# Patient Record
Sex: Female | Born: 1975
Health system: Southern US, Community
[De-identification: ages and names within clinical notes are randomized; demographics above are authoritative.]

## PROBLEM LIST (undated history)

## (undated) DIAGNOSIS — J45909 Unspecified asthma, uncomplicated: Secondary | ICD-10-CM

## (undated) DIAGNOSIS — R0602 Shortness of breath: Secondary | ICD-10-CM

## (undated) DIAGNOSIS — E109 Type 1 diabetes mellitus without complications: Secondary | ICD-10-CM

## (undated) DIAGNOSIS — M1990 Unspecified osteoarthritis, unspecified site: Secondary | ICD-10-CM

## (undated) DIAGNOSIS — K219 Gastro-esophageal reflux disease without esophagitis: Secondary | ICD-10-CM

## (undated) DIAGNOSIS — J383 Other diseases of vocal cords: Secondary | ICD-10-CM

## (undated) DIAGNOSIS — I10 Essential (primary) hypertension: Secondary | ICD-10-CM

## (undated) DIAGNOSIS — E785 Hyperlipidemia, unspecified: Secondary | ICD-10-CM

## (undated) DIAGNOSIS — B009 Herpesviral infection, unspecified: Secondary | ICD-10-CM

## (undated) DIAGNOSIS — F329 Major depressive disorder, single episode, unspecified: Secondary | ICD-10-CM

## (undated) DIAGNOSIS — F411 Generalized anxiety disorder: Secondary | ICD-10-CM

## (undated) HISTORY — DX: Essential (primary) hypertension: I10

## (undated) HISTORY — DX: Major depressive disorder, single episode, unspecified: F32.9

## (undated) HISTORY — DX: Gastro-esophageal reflux disease without esophagitis: K21.9

## (undated) HISTORY — DX: Herpesviral infection, unspecified: B00.9

## (undated) HISTORY — DX: Shortness of breath: R06.02

## (undated) HISTORY — DX: Other diseases of vocal cords: J38.3

## (undated) HISTORY — DX: Unspecified asthma, uncomplicated: J45.909

## (undated) HISTORY — DX: Generalized anxiety disorder: F41.1

## (undated) HISTORY — DX: Unspecified osteoarthritis, unspecified site: M19.90

## (undated) HISTORY — DX: Type 1 diabetes mellitus without complications: E10.9

---

## 1997-08-17 ENCOUNTER — Encounter (HOSPITAL_COMMUNITY): Admission: RE | Admit: 1997-08-17 | Discharge: 1997-09-01 | Payer: Self-pay | Admitting: Obstetrics and Gynecology

## 1997-08-24 ENCOUNTER — Ambulatory Visit (HOSPITAL_COMMUNITY): Admission: RE | Admit: 1997-08-24 | Discharge: 1997-08-24 | Payer: Self-pay | Admitting: Obstetrics and Gynecology

## 1997-08-27 ENCOUNTER — Inpatient Hospital Stay (HOSPITAL_COMMUNITY): Admission: AD | Admit: 1997-08-27 | Discharge: 1997-08-28 | Payer: Self-pay | Admitting: Obstetrics and Gynecology

## 1997-09-01 ENCOUNTER — Inpatient Hospital Stay (HOSPITAL_COMMUNITY): Admission: RE | Admit: 1997-09-01 | Discharge: 1997-09-04 | Payer: Self-pay | Admitting: Obstetrics and Gynecology

## 1997-09-07 ENCOUNTER — Encounter: Admission: RE | Admit: 1997-09-07 | Discharge: 1997-12-06 | Payer: Self-pay | Admitting: Obstetrics and Gynecology

## 1998-03-29 ENCOUNTER — Emergency Department (HOSPITAL_COMMUNITY): Admission: EM | Admit: 1998-03-29 | Discharge: 1998-03-29 | Payer: Self-pay | Admitting: *Deleted

## 1998-12-12 ENCOUNTER — Emergency Department (HOSPITAL_COMMUNITY): Admission: EM | Admit: 1998-12-12 | Discharge: 1998-12-12 | Payer: Self-pay | Admitting: Emergency Medicine

## 1998-12-12 ENCOUNTER — Encounter: Payer: Self-pay | Admitting: Emergency Medicine

## 1998-12-13 ENCOUNTER — Encounter: Payer: Self-pay | Admitting: Emergency Medicine

## 1998-12-13 ENCOUNTER — Ambulatory Visit (HOSPITAL_COMMUNITY): Admission: RE | Admit: 1998-12-13 | Discharge: 1998-12-13 | Payer: Self-pay | Admitting: Emergency Medicine

## 1999-05-27 ENCOUNTER — Other Ambulatory Visit: Admission: RE | Admit: 1999-05-27 | Discharge: 1999-05-27 | Payer: Self-pay | Admitting: Obstetrics and Gynecology

## 2000-06-01 ENCOUNTER — Emergency Department (HOSPITAL_COMMUNITY): Admission: EM | Admit: 2000-06-01 | Discharge: 2000-06-01 | Payer: Self-pay | Admitting: Emergency Medicine

## 2001-05-06 ENCOUNTER — Encounter: Admission: RE | Admit: 2001-05-06 | Discharge: 2001-08-04 | Payer: Self-pay | Admitting: Obstetrics and Gynecology

## 2001-05-08 ENCOUNTER — Other Ambulatory Visit: Admission: RE | Admit: 2001-05-08 | Discharge: 2001-05-08 | Payer: Self-pay | Admitting: Obstetrics and Gynecology

## 2001-06-24 ENCOUNTER — Encounter: Payer: Self-pay | Admitting: Obstetrics and Gynecology

## 2001-06-24 ENCOUNTER — Ambulatory Visit (HOSPITAL_COMMUNITY): Admission: RE | Admit: 2001-06-24 | Discharge: 2001-06-24 | Payer: Self-pay | Admitting: Obstetrics and Gynecology

## 2001-07-02 ENCOUNTER — Ambulatory Visit (HOSPITAL_COMMUNITY): Admission: RE | Admit: 2001-07-02 | Discharge: 2001-07-02 | Payer: Self-pay | Admitting: Obstetrics and Gynecology

## 2001-07-02 ENCOUNTER — Encounter: Payer: Self-pay | Admitting: Obstetrics and Gynecology

## 2001-09-09 ENCOUNTER — Inpatient Hospital Stay (HOSPITAL_COMMUNITY): Admission: AD | Admit: 2001-09-09 | Discharge: 2001-09-09 | Payer: Self-pay | Admitting: Obstetrics and Gynecology

## 2001-09-10 ENCOUNTER — Inpatient Hospital Stay (HOSPITAL_COMMUNITY): Admission: AD | Admit: 2001-09-10 | Discharge: 2001-09-10 | Payer: Self-pay | Admitting: Obstetrics and Gynecology

## 2001-09-17 ENCOUNTER — Encounter (HOSPITAL_COMMUNITY): Admission: RE | Admit: 2001-09-17 | Discharge: 2001-10-17 | Payer: Self-pay | Admitting: Obstetrics and Gynecology

## 2001-09-17 ENCOUNTER — Inpatient Hospital Stay (HOSPITAL_COMMUNITY): Admission: AD | Admit: 2001-09-17 | Discharge: 2001-09-17 | Payer: Self-pay | Admitting: Obstetrics and Gynecology

## 2001-10-01 ENCOUNTER — Encounter: Payer: Self-pay | Admitting: Obstetrics and Gynecology

## 2001-10-18 ENCOUNTER — Encounter (HOSPITAL_COMMUNITY): Admission: RE | Admit: 2001-10-18 | Discharge: 2001-11-05 | Payer: Self-pay | Admitting: Obstetrics and Gynecology

## 2001-10-26 ENCOUNTER — Inpatient Hospital Stay (HOSPITAL_COMMUNITY): Admission: AD | Admit: 2001-10-26 | Discharge: 2001-10-26 | Payer: Self-pay | Admitting: Obstetrics and Gynecology

## 2001-11-03 ENCOUNTER — Inpatient Hospital Stay (HOSPITAL_COMMUNITY): Admission: AD | Admit: 2001-11-03 | Discharge: 2001-11-03 | Payer: Self-pay | Admitting: Obstetrics and Gynecology

## 2001-11-05 ENCOUNTER — Encounter: Payer: Self-pay | Admitting: Obstetrics and Gynecology

## 2001-11-07 ENCOUNTER — Inpatient Hospital Stay (HOSPITAL_COMMUNITY): Admission: AD | Admit: 2001-11-07 | Discharge: 2001-11-10 | Payer: Self-pay | Admitting: Obstetrics and Gynecology

## 2001-11-07 ENCOUNTER — Encounter (INDEPENDENT_AMBULATORY_CARE_PROVIDER_SITE_OTHER): Payer: Self-pay

## 2003-05-11 ENCOUNTER — Other Ambulatory Visit: Admission: RE | Admit: 2003-05-11 | Discharge: 2003-05-11 | Payer: Self-pay | Admitting: Obstetrics and Gynecology

## 2003-10-11 ENCOUNTER — Emergency Department (HOSPITAL_COMMUNITY): Admission: EM | Admit: 2003-10-11 | Discharge: 2003-10-11 | Payer: Self-pay | Admitting: Emergency Medicine

## 2003-10-14 ENCOUNTER — Emergency Department (HOSPITAL_COMMUNITY): Admission: EM | Admit: 2003-10-14 | Discharge: 2003-10-14 | Payer: Self-pay | Admitting: Emergency Medicine

## 2003-10-16 ENCOUNTER — Encounter: Admission: RE | Admit: 2003-10-16 | Discharge: 2003-10-16 | Payer: Self-pay | Admitting: Endocrinology

## 2004-06-21 ENCOUNTER — Ambulatory Visit (HOSPITAL_COMMUNITY): Admission: RE | Admit: 2004-06-21 | Discharge: 2004-06-21 | Payer: Self-pay | Admitting: Obstetrics and Gynecology

## 2004-08-01 ENCOUNTER — Ambulatory Visit (HOSPITAL_COMMUNITY): Admission: RE | Admit: 2004-08-01 | Discharge: 2004-08-01 | Payer: Self-pay | Admitting: Obstetrics and Gynecology

## 2004-08-04 ENCOUNTER — Ambulatory Visit: Payer: Self-pay | Admitting: Endocrinology

## 2004-08-15 ENCOUNTER — Ambulatory Visit: Payer: Self-pay

## 2004-08-18 ENCOUNTER — Ambulatory Visit: Payer: Self-pay | Admitting: Endocrinology

## 2004-09-23 ENCOUNTER — Ambulatory Visit: Payer: Self-pay | Admitting: Endocrinology

## 2004-11-08 ENCOUNTER — Ambulatory Visit: Payer: Self-pay | Admitting: Endocrinology

## 2004-11-09 ENCOUNTER — Ambulatory Visit: Payer: Self-pay

## 2005-06-14 ENCOUNTER — Ambulatory Visit: Payer: Self-pay | Admitting: Endocrinology

## 2005-09-13 ENCOUNTER — Ambulatory Visit: Payer: Self-pay | Admitting: Endocrinology

## 2006-04-10 ENCOUNTER — Ambulatory Visit: Payer: Self-pay | Admitting: Endocrinology

## 2006-06-20 ENCOUNTER — Ambulatory Visit: Payer: Self-pay | Admitting: Internal Medicine

## 2006-07-19 ENCOUNTER — Ambulatory Visit: Payer: Self-pay | Admitting: Endocrinology

## 2006-08-07 ENCOUNTER — Ambulatory Visit: Payer: Self-pay | Admitting: Endocrinology

## 2006-09-19 ENCOUNTER — Ambulatory Visit: Payer: Self-pay | Admitting: Endocrinology

## 2006-09-24 ENCOUNTER — Ambulatory Visit: Payer: Self-pay | Admitting: Endocrinology

## 2006-12-14 ENCOUNTER — Ambulatory Visit: Payer: Self-pay | Admitting: Internal Medicine

## 2006-12-20 ENCOUNTER — Ambulatory Visit: Payer: Self-pay | Admitting: Endocrinology

## 2007-02-02 ENCOUNTER — Encounter: Payer: Self-pay | Admitting: Endocrinology

## 2007-02-02 DIAGNOSIS — J45909 Unspecified asthma, uncomplicated: Secondary | ICD-10-CM

## 2007-02-02 DIAGNOSIS — E109 Type 1 diabetes mellitus without complications: Secondary | ICD-10-CM

## 2007-02-02 HISTORY — DX: Unspecified asthma, uncomplicated: J45.909

## 2007-02-02 HISTORY — DX: Type 1 diabetes mellitus without complications: E10.9

## 2007-03-23 ENCOUNTER — Emergency Department (HOSPITAL_COMMUNITY): Admission: EM | Admit: 2007-03-23 | Discharge: 2007-03-23 | Payer: Self-pay | Admitting: Emergency Medicine

## 2007-05-01 ENCOUNTER — Ambulatory Visit: Payer: Self-pay | Admitting: Endocrinology

## 2007-05-01 ENCOUNTER — Telehealth: Payer: Self-pay | Admitting: Endocrinology

## 2007-07-18 HISTORY — PX: ABDOMINAL HYSTERECTOMY: SHX81

## 2007-07-30 ENCOUNTER — Encounter: Payer: Self-pay | Admitting: Endocrinology

## 2007-08-01 ENCOUNTER — Encounter: Admission: RE | Admit: 2007-08-01 | Discharge: 2007-08-01 | Payer: Self-pay | Admitting: Otolaryngology

## 2007-09-25 ENCOUNTER — Ambulatory Visit: Payer: Self-pay | Admitting: Endocrinology

## 2007-09-25 ENCOUNTER — Telehealth (INDEPENDENT_AMBULATORY_CARE_PROVIDER_SITE_OTHER): Payer: Self-pay | Admitting: *Deleted

## 2007-09-25 DIAGNOSIS — R0602 Shortness of breath: Secondary | ICD-10-CM

## 2007-09-25 DIAGNOSIS — Z720 Tobacco use: Secondary | ICD-10-CM | POA: Insufficient documentation

## 2007-09-25 DIAGNOSIS — F17200 Nicotine dependence, unspecified, uncomplicated: Secondary | ICD-10-CM | POA: Insufficient documentation

## 2007-09-25 HISTORY — DX: Shortness of breath: R06.02

## 2007-09-27 ENCOUNTER — Telehealth: Payer: Self-pay | Admitting: Endocrinology

## 2007-10-03 ENCOUNTER — Telehealth: Payer: Self-pay | Admitting: Endocrinology

## 2007-10-16 ENCOUNTER — Ambulatory Visit: Payer: Self-pay | Admitting: Gastroenterology

## 2007-10-16 DIAGNOSIS — R1012 Left upper quadrant pain: Secondary | ICD-10-CM | POA: Insufficient documentation

## 2007-10-16 DIAGNOSIS — R197 Diarrhea, unspecified: Secondary | ICD-10-CM | POA: Insufficient documentation

## 2007-11-02 ENCOUNTER — Emergency Department (HOSPITAL_COMMUNITY): Admission: EM | Admit: 2007-11-02 | Discharge: 2007-11-02 | Payer: Self-pay | Admitting: Emergency Medicine

## 2007-11-04 ENCOUNTER — Ambulatory Visit: Payer: Self-pay | Admitting: Gastroenterology

## 2007-11-04 LAB — HM COLONOSCOPY

## 2007-11-07 ENCOUNTER — Ambulatory Visit (HOSPITAL_COMMUNITY): Admission: RE | Admit: 2007-11-07 | Discharge: 2007-11-07 | Payer: Self-pay | Admitting: Gastroenterology

## 2007-11-19 ENCOUNTER — Ambulatory Visit: Payer: Self-pay | Admitting: Gastroenterology

## 2008-01-08 ENCOUNTER — Ambulatory Visit: Payer: Self-pay | Admitting: Gastroenterology

## 2008-01-15 ENCOUNTER — Ambulatory Visit (HOSPITAL_COMMUNITY): Admission: RE | Admit: 2008-01-15 | Discharge: 2008-01-16 | Payer: Self-pay | Admitting: Obstetrics and Gynecology

## 2008-01-15 ENCOUNTER — Encounter (INDEPENDENT_AMBULATORY_CARE_PROVIDER_SITE_OTHER): Payer: Self-pay | Admitting: Obstetrics and Gynecology

## 2008-01-29 ENCOUNTER — Telehealth: Payer: Self-pay | Admitting: Endocrinology

## 2008-01-29 DIAGNOSIS — D239 Other benign neoplasm of skin, unspecified: Secondary | ICD-10-CM | POA: Insufficient documentation

## 2008-02-03 ENCOUNTER — Encounter (INDEPENDENT_AMBULATORY_CARE_PROVIDER_SITE_OTHER): Payer: Self-pay | Admitting: *Deleted

## 2008-03-01 ENCOUNTER — Encounter: Payer: Self-pay | Admitting: Endocrinology

## 2008-03-16 ENCOUNTER — Telehealth (INDEPENDENT_AMBULATORY_CARE_PROVIDER_SITE_OTHER): Payer: Self-pay | Admitting: *Deleted

## 2008-04-16 ENCOUNTER — Ambulatory Visit: Payer: Self-pay | Admitting: Endocrinology

## 2008-05-04 ENCOUNTER — Ambulatory Visit: Payer: Self-pay | Admitting: Endocrinology

## 2008-05-04 DIAGNOSIS — J0100 Acute maxillary sinusitis, unspecified: Secondary | ICD-10-CM | POA: Insufficient documentation

## 2008-05-04 DIAGNOSIS — J019 Acute sinusitis, unspecified: Secondary | ICD-10-CM | POA: Insufficient documentation

## 2008-05-04 DIAGNOSIS — Z8709 Personal history of other diseases of the respiratory system: Secondary | ICD-10-CM | POA: Insufficient documentation

## 2008-05-21 ENCOUNTER — Telehealth (INDEPENDENT_AMBULATORY_CARE_PROVIDER_SITE_OTHER): Payer: Self-pay | Admitting: *Deleted

## 2008-08-04 ENCOUNTER — Emergency Department (HOSPITAL_COMMUNITY): Admission: EM | Admit: 2008-08-04 | Discharge: 2008-08-05 | Payer: Self-pay | Admitting: Emergency Medicine

## 2008-08-12 ENCOUNTER — Ambulatory Visit: Payer: Self-pay | Admitting: Internal Medicine

## 2008-08-12 DIAGNOSIS — J209 Acute bronchitis, unspecified: Secondary | ICD-10-CM | POA: Insufficient documentation

## 2008-09-28 ENCOUNTER — Telehealth: Payer: Self-pay | Admitting: Endocrinology

## 2008-10-14 ENCOUNTER — Telehealth (INDEPENDENT_AMBULATORY_CARE_PROVIDER_SITE_OTHER): Payer: Self-pay | Admitting: *Deleted

## 2008-10-15 ENCOUNTER — Telehealth: Payer: Self-pay | Admitting: Endocrinology

## 2008-10-22 ENCOUNTER — Telehealth (INDEPENDENT_AMBULATORY_CARE_PROVIDER_SITE_OTHER): Payer: Self-pay | Admitting: *Deleted

## 2008-11-25 ENCOUNTER — Encounter: Payer: Self-pay | Admitting: Internal Medicine

## 2008-11-26 ENCOUNTER — Emergency Department (HOSPITAL_COMMUNITY): Admission: EM | Admit: 2008-11-26 | Discharge: 2008-11-26 | Payer: Self-pay | Admitting: Family Medicine

## 2008-12-17 ENCOUNTER — Telehealth: Payer: Self-pay | Admitting: Gastroenterology

## 2008-12-21 ENCOUNTER — Ambulatory Visit: Payer: Self-pay | Admitting: Gastroenterology

## 2008-12-21 DIAGNOSIS — R141 Gas pain: Secondary | ICD-10-CM | POA: Insufficient documentation

## 2008-12-21 DIAGNOSIS — K219 Gastro-esophageal reflux disease without esophagitis: Secondary | ICD-10-CM

## 2008-12-21 DIAGNOSIS — R143 Flatulence: Secondary | ICD-10-CM

## 2008-12-21 DIAGNOSIS — R142 Eructation: Secondary | ICD-10-CM

## 2008-12-21 HISTORY — DX: Gastro-esophageal reflux disease without esophagitis: K21.9

## 2009-01-13 ENCOUNTER — Telehealth (INDEPENDENT_AMBULATORY_CARE_PROVIDER_SITE_OTHER): Payer: Self-pay | Admitting: *Deleted

## 2009-01-15 ENCOUNTER — Telehealth (INDEPENDENT_AMBULATORY_CARE_PROVIDER_SITE_OTHER): Payer: Self-pay | Admitting: *Deleted

## 2009-02-16 ENCOUNTER — Telehealth: Payer: Self-pay | Admitting: Gastroenterology

## 2009-02-16 ENCOUNTER — Ambulatory Visit: Payer: Self-pay | Admitting: Endocrinology

## 2009-02-16 DIAGNOSIS — F3289 Other specified depressive episodes: Secondary | ICD-10-CM

## 2009-02-16 DIAGNOSIS — F329 Major depressive disorder, single episode, unspecified: Secondary | ICD-10-CM

## 2009-02-16 HISTORY — DX: Other specified depressive episodes: F32.89

## 2009-02-16 HISTORY — DX: Major depressive disorder, single episode, unspecified: F32.9

## 2009-02-17 ENCOUNTER — Telehealth: Payer: Self-pay | Admitting: Physician Assistant

## 2009-02-19 ENCOUNTER — Ambulatory Visit: Payer: Self-pay | Admitting: Gastroenterology

## 2009-02-19 DIAGNOSIS — R110 Nausea: Secondary | ICD-10-CM | POA: Insufficient documentation

## 2009-03-18 ENCOUNTER — Telehealth (INDEPENDENT_AMBULATORY_CARE_PROVIDER_SITE_OTHER): Payer: Self-pay | Admitting: *Deleted

## 2009-03-26 ENCOUNTER — Ambulatory Visit: Payer: Self-pay | Admitting: Endocrinology

## 2009-03-26 DIAGNOSIS — F411 Generalized anxiety disorder: Secondary | ICD-10-CM | POA: Insufficient documentation

## 2009-03-26 HISTORY — DX: Generalized anxiety disorder: F41.1

## 2009-03-29 ENCOUNTER — Ambulatory Visit: Payer: Self-pay | Admitting: Gastroenterology

## 2009-03-29 ENCOUNTER — Encounter (INDEPENDENT_AMBULATORY_CARE_PROVIDER_SITE_OTHER): Payer: Self-pay | Admitting: *Deleted

## 2009-03-29 ENCOUNTER — Encounter: Payer: Self-pay | Admitting: Physician Assistant

## 2009-03-29 ENCOUNTER — Encounter: Payer: Self-pay | Admitting: Endocrinology

## 2009-03-29 LAB — CONVERTED CEMR LAB
CRP, High Sensitivity: 0.5 (ref 0.00–5.00)
Eosinophils Relative: 0.9 % (ref 0.0–5.0)
HCT: 44.7 % (ref 36.0–46.0)
Hemoglobin: 15.2 g/dL — ABNORMAL HIGH (ref 12.0–15.0)
Lymphocytes Relative: 26.4 % (ref 12.0–46.0)
Lymphs Abs: 1.8 10*3/uL (ref 0.7–4.0)
Monocytes Relative: 8 % (ref 3.0–12.0)
Neutro Abs: 4.4 10*3/uL (ref 1.4–7.7)
Platelets: 205 10*3/uL (ref 150.0–400.0)
WBC: 6.9 10*3/uL (ref 4.5–10.5)

## 2009-03-31 LAB — CONVERTED CEMR LAB: Tissue Transglutaminase Ab, IgA: 0.3 units (ref <7)

## 2009-05-03 ENCOUNTER — Telehealth: Payer: Self-pay | Admitting: Endocrinology

## 2009-05-07 ENCOUNTER — Telehealth (INDEPENDENT_AMBULATORY_CARE_PROVIDER_SITE_OTHER): Payer: Self-pay | Admitting: *Deleted

## 2009-05-10 ENCOUNTER — Telehealth: Payer: Self-pay | Admitting: Endocrinology

## 2009-06-28 ENCOUNTER — Emergency Department (HOSPITAL_COMMUNITY): Admission: EM | Admit: 2009-06-28 | Discharge: 2009-06-28 | Payer: Self-pay | Admitting: Family Medicine

## 2009-08-25 ENCOUNTER — Ambulatory Visit: Payer: Self-pay | Admitting: Endocrinology

## 2009-08-25 DIAGNOSIS — L738 Other specified follicular disorders: Secondary | ICD-10-CM | POA: Insufficient documentation

## 2010-01-23 ENCOUNTER — Emergency Department (HOSPITAL_COMMUNITY): Admission: EM | Admit: 2010-01-23 | Discharge: 2010-01-23 | Payer: Self-pay | Admitting: Emergency Medicine

## 2010-02-18 ENCOUNTER — Ambulatory Visit: Payer: Self-pay | Admitting: Endocrinology

## 2010-02-18 DIAGNOSIS — G4700 Insomnia, unspecified: Secondary | ICD-10-CM | POA: Insufficient documentation

## 2010-02-28 ENCOUNTER — Encounter: Payer: Self-pay | Admitting: Endocrinology

## 2010-02-28 ENCOUNTER — Telehealth (INDEPENDENT_AMBULATORY_CARE_PROVIDER_SITE_OTHER): Payer: Self-pay | Admitting: *Deleted

## 2010-03-09 ENCOUNTER — Ambulatory Visit: Payer: Self-pay | Admitting: Internal Medicine

## 2010-03-09 DIAGNOSIS — N390 Urinary tract infection, site not specified: Secondary | ICD-10-CM | POA: Insufficient documentation

## 2010-03-09 LAB — CONVERTED CEMR LAB
Blood in Urine, dipstick: NEGATIVE
Glucose, Urine, Semiquant: >=1000
Ketones, urine, test strip: NEGATIVE
Urobilinogen, UA: 0.2
pH: 5

## 2010-03-18 ENCOUNTER — Ambulatory Visit: Payer: Self-pay | Admitting: Endocrinology

## 2010-03-18 ENCOUNTER — Telehealth: Payer: Self-pay | Admitting: Endocrinology

## 2010-03-18 DIAGNOSIS — N764 Abscess of vulva: Secondary | ICD-10-CM | POA: Insufficient documentation

## 2010-04-01 DIAGNOSIS — R32 Unspecified urinary incontinence: Secondary | ICD-10-CM

## 2010-04-01 DIAGNOSIS — F32A Depression, unspecified: Secondary | ICD-10-CM | POA: Insufficient documentation

## 2010-04-01 DIAGNOSIS — F39 Unspecified mood [affective] disorder: Secondary | ICD-10-CM | POA: Insufficient documentation

## 2010-04-01 DIAGNOSIS — K219 Gastro-esophageal reflux disease without esophagitis: Secondary | ICD-10-CM | POA: Insufficient documentation

## 2010-04-01 DIAGNOSIS — E109 Type 1 diabetes mellitus without complications: Secondary | ICD-10-CM | POA: Insufficient documentation

## 2010-04-01 HISTORY — DX: Gastro-esophageal reflux disease without esophagitis: K21.9

## 2010-04-01 HISTORY — DX: Unspecified urinary incontinence: R32

## 2010-04-07 ENCOUNTER — Telehealth (INDEPENDENT_AMBULATORY_CARE_PROVIDER_SITE_OTHER): Payer: Self-pay | Admitting: *Deleted

## 2010-05-10 ENCOUNTER — Emergency Department (HOSPITAL_COMMUNITY): Admission: EM | Admit: 2010-05-10 | Discharge: 2010-05-10 | Payer: Self-pay | Admitting: Emergency Medicine

## 2010-05-12 ENCOUNTER — Encounter: Payer: Self-pay | Admitting: Endocrinology

## 2010-05-12 ENCOUNTER — Ambulatory Visit: Payer: Self-pay | Admitting: Endocrinology

## 2010-05-12 ENCOUNTER — Telehealth: Payer: Self-pay | Admitting: Endocrinology

## 2010-05-12 DIAGNOSIS — R930 Abnormal findings on diagnostic imaging of skull and head, not elsewhere classified: Secondary | ICD-10-CM | POA: Insufficient documentation

## 2010-05-12 LAB — CONVERTED CEMR LAB
AST: 16 units/L (ref 0–37)
Albumin: 4.2 g/dL (ref 3.5–5.2)
Alkaline Phosphatase: 112 units/L (ref 39–117)
BUN: 10 mg/dL (ref 6–23)
Basophils Absolute: 0 10*3/uL (ref 0.0–0.1)
Bilirubin, Direct: 0 mg/dL (ref 0.0–0.3)
CO2: 24 meq/L (ref 19–32)
Calcium: 9.1 mg/dL (ref 8.4–10.5)
Cholesterol: 220 mg/dL — ABNORMAL HIGH (ref 0–200)
Creatinine, Ser: 0.5 mg/dL (ref 0.4–1.2)
Creatinine,U: 7.3 mg/dL
Eosinophils Absolute: 0.1 10*3/uL (ref 0.0–0.7)
GFR calc non Af Amer: 140.29 mL/min (ref >60)
Glucose, Bld: 236 mg/dL — ABNORMAL HIGH (ref 70–99)
HDL: 49.9 mg/dL (ref >39.00)
Hemoglobin: 14.8 g/dL (ref 12.0–15.0)
Leukocytes, UA: NEGATIVE
Lymphocytes Relative: 30.6 % (ref 12.0–46.0)
MCHC: 35 g/dL (ref 30.0–36.0)
Microalb Creat Ratio: 17.7 mg/g (ref 0.0–30.0)
Microalb, Ur: 1.3 mg/dL (ref 0.0–1.9)
Monocytes Relative: 6.8 % (ref 3.0–12.0)
Neutro Abs: 4.3 10*3/uL (ref 1.4–7.7)
Neutrophils Relative %: 61.2 % (ref 43.0–77.0)
Platelets: 221 10*3/uL (ref 150.0–400.0)
RDW: 13.1 % (ref 11.5–14.6)
Sodium: 127 meq/L — ABNORMAL LOW (ref 135–145)
Specific Gravity, Urine: <=1.005 (ref 1.000–1.030)
Total Bilirubin: 0.3 mg/dL (ref 0.3–1.2)
Triglycerides: 200 mg/dL — ABNORMAL HIGH (ref 0.0–149.0)
Urine Glucose: 500 mg/dL
Urobilinogen, UA: 0.2 (ref 0.0–1.0)

## 2010-06-08 ENCOUNTER — Ambulatory Visit: Payer: Self-pay | Admitting: Pulmonary Disease

## 2010-06-08 DIAGNOSIS — J989 Respiratory disorder, unspecified: Secondary | ICD-10-CM | POA: Insufficient documentation

## 2010-06-17 ENCOUNTER — Ambulatory Visit (HOSPITAL_BASED_OUTPATIENT_CLINIC_OR_DEPARTMENT_OTHER)
Admission: RE | Admit: 2010-06-17 | Discharge: 2010-06-17 | Payer: Self-pay | Source: Home / Self Care | Admitting: Urology

## 2010-07-30 ENCOUNTER — Emergency Department (HOSPITAL_COMMUNITY)
Admission: EM | Admit: 2010-07-30 | Discharge: 2010-07-30 | Payer: Self-pay | Source: Home / Self Care | Admitting: Emergency Medicine

## 2010-08-01 LAB — GLUCOSE, CAPILLARY: Glucose-Capillary: 276 mg/dL — ABNORMAL HIGH (ref 70–99)

## 2010-08-01 LAB — CBC
HCT: 41.8 % (ref 36.0–46.0)
Hemoglobin: 14.9 g/dL (ref 12.0–15.0)
MCH: 31.9 pg (ref 26.0–34.0)
MCHC: 35.6 g/dL (ref 30.0–36.0)
MCV: 89.5 fL (ref 78.0–100.0)
Platelets: 175 10*3/uL (ref 150–400)
RBC: 4.67 MIL/uL (ref 3.87–5.11)
RDW: 12.7 % (ref 11.5–15.5)
WBC: 13.3 10*3/uL — ABNORMAL HIGH (ref 4.0–10.5)

## 2010-08-01 LAB — COMPREHENSIVE METABOLIC PANEL
ALT: 15 U/L (ref 0–35)
AST: 20 U/L (ref 0–37)
Albumin: 4.2 g/dL (ref 3.5–5.2)
Alkaline Phosphatase: 99 U/L (ref 39–117)
BUN: 4 mg/dL — ABNORMAL LOW (ref 6–23)
CO2: 23 mEq/L (ref 19–32)
Calcium: 9.4 mg/dL (ref 8.4–10.5)
Chloride: 95 mEq/L — ABNORMAL LOW (ref 96–112)
Creatinine, Ser: 0.55 mg/dL (ref 0.4–1.2)
GFR calc Af Amer: >60 mL/min (ref >60)
GFR calc non Af Amer: >60 mL/min (ref >60)
Glucose, Bld: 335 mg/dL — ABNORMAL HIGH (ref 70–99)
Potassium: 3.8 mEq/L (ref 3.5–5.1)
Sodium: 130 mEq/L — ABNORMAL LOW (ref 135–145)
Total Bilirubin: 0.5 mg/dL (ref 0.3–1.2)
Total Protein: 7.4 g/dL (ref 6.0–8.3)

## 2010-08-01 LAB — URINALYSIS, ROUTINE W REFLEX MICROSCOPIC
Bilirubin Urine: NEGATIVE
Ketones, ur: NEGATIVE mg/dL
Leukocytes, UA: NEGATIVE
Nitrite: NEGATIVE
Protein, ur: NEGATIVE mg/dL
Specific Gravity, Urine: 1.016 (ref 1.005–1.030)
Urine Glucose, Fasting: >1000 mg/dL — AB
Urobilinogen, UA: 0.2 mg/dL (ref 0.0–1.0)
pH: 5 (ref 5.0–8.0)

## 2010-08-01 LAB — DIFFERENTIAL
Basophils Absolute: 0 10*3/uL (ref 0.0–0.1)
Basophils Relative: 0 % (ref 0–1)
Eosinophils Absolute: 0 10*3/uL (ref 0.0–0.7)
Eosinophils Relative: 0 % (ref 0–5)
Lymphocytes Relative: 11 % — ABNORMAL LOW (ref 12–46)
Lymphs Abs: 1.5 10*3/uL (ref 0.7–4.0)
Monocytes Absolute: 0.7 10*3/uL (ref 0.1–1.0)
Monocytes Relative: 6 % (ref 3–12)
Neutro Abs: 11 10*3/uL — ABNORMAL HIGH (ref 1.7–7.7)
Neutrophils Relative %: 83 % — ABNORMAL HIGH (ref 43–77)

## 2010-08-01 LAB — URINE MICROSCOPIC-ADD ON

## 2010-08-01 LAB — POCT PREGNANCY, URINE: Preg Test, Ur: NEGATIVE

## 2010-08-01 LAB — LIPASE, BLOOD: Lipase: 35 U/L (ref 11–59)

## 2010-08-08 ENCOUNTER — Telehealth: Payer: Self-pay | Admitting: Endocrinology

## 2010-08-08 LAB — GLUCOSE, CAPILLARY

## 2010-08-10 ENCOUNTER — Ambulatory Visit: Admit: 2010-08-10 | Payer: Self-pay | Admitting: Endocrinology

## 2010-08-14 LAB — CONVERTED CEMR LAB: Pap Smear: NORMAL

## 2010-08-16 NOTE — Assessment & Plan Note (Signed)
Summary: post er/chest pain/emphyzema/lb   Vital Signs:  Patient profile:   35 year old female Height:      67.5 inches (171.45 cm) Weight:      130.38 pounds (59.26 kg) BMI:     20.19 O2 Sat:      97 % on Room air Temp:     98.6 degrees F (37.00 degrees C) oral Pulse rate:   96 / minute BP sitting:   138 / 74  (left arm) Cuff size:   regular  Vitals Entered By: Brenton Grills MA (May 12, 2010 10:54 AM)  O2 Flow:  Room air CC: Post ER F/U/chest pain/aj Is Patient Diabetic? Yes   Referring Provider:  n/a Primary Provider:  Romero Belling, MD   CC:  Post ER F/U/chest pain/aj.  History of Present Illness: pt states few weeks of moderate pain in the chest, in the context of cough.   no cbg record, but states cbg's are well-controlled   Current Medications (verified): 1)  Paxil 40 Mg  Tabs (Paroxetine Hcl) .... Qd 2)  Aurora Pen Needles 31g X 8 Mm Misc (Insulin Pen Needle) .... Use As Directed Pt Needs Fu Appt No Additional Refills 3)  Proventil Hfa 108 (90 Base) Mcg/act Aers (Albuterol Sulfate) .... 2 Puffs Q 4-6 Hours 4)  Omeprazole 20 Mg Cpdr (Omeprazole) .Marland Kitchen.. 1 By Mouth Once Daily 5)  Prodigy Blood Glucose Test  Strp (Glucose Blood) .... 6/day, and Lancets.  250.01.  Variable Glucoses. 6)  Advair Diskus 100-50 Mcg/dose Aepb (Fluticasone-Salmeterol) .Marland Kitchen.. 1 Puff Two Times A Day 7)  Novolog Flexpen 100 Unit/ml Soln (Insulin Aspart) .Marland Kitchen.. 10 Units Three Times A Day (Qac) 8)  Doxycycline Hyclate 100 Mg Tabs (Doxycycline Hyclate) .Marland Kitchen.. 1 By Mouth Two Times A Day 9)  Ropinirole Hcl 0.5 Mg Tabs (Ropinirole Hcl) .Marland Kitchen.. 1 Tab At Bedtime  Allergies (verified): 1)  ! Codeine  Past History:  Past Medical History: Last updated: 03/09/2010 GERD DIABETES MELLITUS, TYPE I (ICD-250.01) ASTHMA (ICD-493.90) Endometriosis/S/P HYSTERECTOMY SMOKER  Social History: Reviewed history from 12/21/2008 and no changes required. Separated. 2 children--pt has custody.  husb apys child  support. Patient currently smokes. 1 1/2ppd. Alcohol Use - no Daily Caffeine Use     3 2 liters a day    Illicit Drug Use - no Patient does not get regular exercise.   Review of Systems  The patient denies dyspnea on exertion.         she had low-grade fever.    Physical Exam  General:  normal appearance.   Lungs:  Clear to auscultation bilaterally. Normal respiratory effort.  Additional Exam:  outside test results are reviewed:  i reviewed cxr report from the hospital  today: Sodium         [L]  127 mEq/L  Hemoglobin A1C       [H]  10.3 %  Cholesterol LDL  142.7 mg/dL   Impression & Recommendations:  Problem # 1:  NONSPCIFC ABN FINDING RAD & OTH EXAM LUNG FIELD (ICD-793.1) uncertain etiology  Problem # 2:  chest pain prob due to acute bronchitis  Problem # 3:  DIABETES MELLITUS, TYPE I (ICD-250.01) poor control  Problem # 4:  SMOKER (ICD-305.1) pt would like to quit  Problem # 5:  hyperlipidemia needs increased rx  Medications Added to Medication List This Visit: 1)  Doxycycline Hyclate 100 Mg Tabs (Doxycycline hyclate) .Marland Kitchen.. 1 by mouth two times a day 2)  Chantix Starting Month Pak 0.5 Mg X  11 & 1 Mg X 42 Tabs (Varenicline tartrate) .... Take two times a day.  refill with continuing packs.  Other Orders: Spirometry w/Graph (94010) TLB-Lipid Panel (80061-LIPID) TLB-BMP (Basic Metabolic Panel-BMET) (80048-METABOL) TLB-CBC Platelet - w/Differential (85025-CBCD) TLB-Hepatic/Liver Function Pnl (80076-HEPATIC) TLB-TSH (Thyroid Stimulating Hormone) (84443-TSH) TLB-A1C / Hgb A1C (Glycohemoglobin) (83036-A1C) TLB-Microalbumin/Creat Ratio, Urine (82043-MALB) TLB-Udip w/ Micro (81001-URINE) Est. Patient Level IV (38756)  Patient Instructions: 1)  refer pulmonary.  you will be called with a day and time for an appointment. 2)  pending the specialist appointment, continue advair and proventil. 3)  i sent [facility].   4)  blood tests are being  ordered for you today.  please call [phone] to hear your test results. 5)  (update: i left message on phone-tree:  ret 1-2 weeks with cbg record.  we'll do acth test then.  you should consider chol med). Prescriptions: CHANTIX STARTING MONTH PAK 0.5 MG X 11 & 1 MG X 42 TABS (VARENICLINE TARTRATE) take two times a day.  refill with continuing packs.  #1 pack x 5   Entered and Authorized by:   Minus Breeding MD   Signed by:   Minus Breeding MD on 05/12/2010   Method used:   Electronically to        [facility] (ret[facility])       [address]       [place], [place]  [address]       Ph: [phone]       Fax: [phone]   RxID:   [id number]    Orders Added: 1)  Spirometry w/Graph [94010] 2)  TLB-Lipid Panel [80061-LIPID] 3)  TLB-BMP (Basic Metabolic Panel-BMET) [80048-METABOL] 4)  TLB-CBC Platelet - w/Differential [85025-CBCD] 5)  TLB-Hepatic/Liver Function Pnl [80076-HEPATIC] 6)  TLB-TSH (Thyroid Stimulating Hormone) [84443-TSH] 7)  TLB-A1C / Hgb A1C (Glycohemoglobin) [83036-A1C] 8)  TLB-Microalbumin/Creat Ratio, Urine [82043-MALB] 9)  TLB-Udip w/ Micro [81001-URINE] 10)  Est. Patient Level IV [51761]

## 2010-08-16 NOTE — Progress Notes (Signed)
Summary: referral  Phone Note Call from Patient Call back at Home Phone [phone]   Caller: Patient Summary of Call: Pt wanted to be sure that MD made referral to Pulmo. Referral is not in EMR, please add. Initial call taken by: Margaret Pyle, CMA,  May 12, 2010 4:07 PM  Follow-up for Phone Call        done Follow-up by: Minus Breeding MD,  May 12, 2010 4:26 PM  Additional Follow-up for Phone Call Additional follow up Details #1::        Pt's mother informed and told to expect a call from [facility] with appt info Additional Follow-up by: Margaret Pyle, CMA,  May 12, 2010 4:32 PM

## 2010-08-16 NOTE — Assessment & Plan Note (Signed)
Summary: UTI?  [facility] PT /NWS   Vital Signs:  Patient profile:   35 year old female Height:      67.5 inches (171.45 cm) Weight:      125.4 pounds (57 kg) O2 Sat:      97 % on Room air Temp:     98.6 degrees F (37.00 degrees C) oral Pulse rate:   95 / minute BP sitting:   110 / 76  (left arm) Cuff size:   regular  Vitals Entered By: Orlan Leavens RMA (March 09, 2010 3:06 PM)  O2 Flow:  Room air CC: ? UTI Is Patient Diabetic? Yes Did you bring your meter with you today? No   Primary Care Provider:  Romero Belling, MD   CC:  ? UTI.  History of Present Illness: c/o bladder infection symptoms -  onset 4 days ago - described as suprapubic pressure - not assoc with fever, flank pain or hematuria - +hx same  also would like nicotine patches -  plans to stop smoking - precipitated by smoker boyfriend in hosp now with MI - hosp x 12d   Current Medications (verified): 1)  Paxil 40 Mg  Tabs (Paroxetine Hcl) .... Qd 2)  Aurora Pen Needles 31g X 8 Mm Misc (Insulin Pen Needle) .... Use As Directed Pt Needs Fu Appt No Additional Refills 3)  Proventil Hfa 108 (90 Base) Mcg/act Aers (Albuterol Sulfate) .... 2 Puffs Q 4-6 Hours 4)  Omeprazole 20 Mg Cpdr (Omeprazole) .Marland Kitchen.. 1 By Mouth Once Daily 5)  Prodigy Blood Glucose Test  Strp (Glucose Blood) .... 6/day, and Lancets.  250.01.  Variable Glucoses. 6)  Advair Diskus 100-50 Mcg/dose Aepb (Fluticasone-Salmeterol) .Marland Kitchen.. 1 Puff Two Times A Day 7)  Novolog Flexpen 100 Unit/ml Soln (Insulin Aspart) .Marland Kitchen.. 10 Units Three Times A Day (Qac) 8)  Doxycycline Hyclate 100 Mg Tabs (Doxycycline Hyclate) .Marland Kitchen.. 1 Qd 9)  Ropinirole Hcl 0.5 Mg Tabs (Ropinirole Hcl) .Marland Kitchen.. 1 Tab At Bedtime  Allergies (verified): 1)  ! Codeine  Past History:  Past Medical History: GERD DIABETES MELLITUS, TYPE I (ICD-250.01) ASTHMA (ICD-493.90) Endometriosis/S/P HYSTERECTOMY SMOKER  Review of Systems  The patient denies chest pain, headaches, hemoptysis, abdominal pain,  hematuria, incontinence, and genital sores.    Physical Exam  General:  alert, well-developed, well-nourished, and cooperative to examination.   thin; dtr at side Lungs:  Clear to auscultation bilaterally, except for a few exp wheezes.. Normal respiratory effort.  Heart:  Regular rate and rhythm; no murmurs, rubs,  or bruits. Abdomen:  soft, non-tender, normal bowel sounds, no distention; no masses and no appreciable hepatomegaly or splenomegaly.     Impression & Recommendations:  Problem # 1:  UTI (ICD-599.0)  Udip unremarkable but classic symptoms  send for Ucx and tx with 3 d cipro at least until Ucx back (high risk with DM) Her updated medication list for this problem includes:    Doxycycline Hyclate 100 Mg Tabs (Doxycycline hyclate) .Marland Kitchen... 1 qd    Ciprofloxacin Hcl 500 Mg Tabs (Ciprofloxacin hcl) .Marland Kitchen... 1 by mouth two times a day x 3 days  Orders: UA Dipstick w/o Micro (manual) (45409) Prescription Created Electronically [phone]) T-Culture, Urine (47829-56213)  Encouraged to push clear liquids, get enough rest, and take acetaminophen as needed. To be seen in 10 days if no improvement, sooner if worse.  Problem # 2:  SMOKER (ICD-305.1)  5 minutes today spent on patient education regarding the unhealthy effects of continued tobacco abuse and encouragment of cessation including  medical options available to help patient to quit smoking.  Her updated medication list for this problem includes:    Nicotine 21 Mg/24hr Pt24 (Nicotine) .Marland Kitchen... Change patch daily every 24h x 7days    Nicotine 14 Mg/24hr Pt24 (Nicotine) .Marland Kitchen... Change patch daily x 7 days    Nicotine 7 Mg/24hr Pt24 (Nicotine) .Marland Kitchen... Change daily x 7 days  Orders: Tobacco use cessation intermediate 3-10 minutes (81191)  Problem # 3:  DIABETES MELLITUS, TYPE I (ICD-250.01)  Her updated medication list for this problem includes:    Novolog Flexpen 100 Unit/ml Soln (Insulin aspart) .Marland KitchenMarland KitchenMarland KitchenMarland Kitchen 10 units three times a day  (qac)  Orders: Tobacco use cessation intermediate 3-10 minutes (99406)  Complete Medication List: 1)  Paxil 40 Mg Tabs (Paroxetine hcl) .... Qd 2)  Aurora Pen Needles 31g X 8 Mm Misc (Insulin pen needle) .... Use as directed pt needs fu appt no additional refills 3)  Proventil Hfa 108 (90 Base) Mcg/act Aers (Albuterol sulfate) .... 2 puffs q 4-6 hours 4)  Omeprazole 20 Mg Cpdr (Omeprazole) .Marland Kitchen.. 1 by mouth once daily 5)  Prodigy Blood Glucose Test Strp (Glucose blood) .... 6/day, and lancets.  250.01.  variable glucoses. 6)  Advair Diskus 100-50 Mcg/dose Aepb (Fluticasone-salmeterol) .Marland Kitchen.. 1 puff two times a day 7)  Novolog Flexpen 100 Unit/ml Soln (Insulin aspart) .Marland Kitchen.. 10 units three times a day (qac) 8)  Doxycycline Hyclate 100 Mg Tabs (Doxycycline hyclate) .Marland Kitchen.. 1 qd 9)  Ropinirole Hcl 0.5 Mg Tabs (Ropinirole hcl) .Marland Kitchen.. 1 tab at bedtime 10)  Ciprofloxacin Hcl 500 Mg Tabs (Ciprofloxacin hcl) .Marland Kitchen.. 1 by mouth two times a day x 3 days 11)  Nicotine 21 Mg/24hr Pt24 (Nicotine) .... Change patch daily every 24h x 7days 12)  Nicotine 14 Mg/24hr Pt24 (Nicotine) .... Change patch daily x 7 days 13)  Nicotine 7 Mg/24hr Pt24 (Nicotine) .... Change daily x 7 days  Patient Instructions: 1)  it was good to see you today. 2)  Cipro for bladder infection symptoms + nicotine patches to help stop smoking as discussed -  3)  your prescriptions have been electronically submitted to your pharmacy. Please take as directed. Contact our office if you believe you're having problems with the medication(s).  4)  Please schedule a follow-up appointment as needed. Prescriptions: NICOTINE 7 MG/24HR PT24 (NICOTINE) change daily x 7 days  #7 x 0   Entered and Authorized by:   Newt Lukes MD   Signed by:   Newt Lukes MD on 03/09/2010   Method used:   Electronically to        [facility] (retail)       [address]       [place], [place]  [address]       Ph: [phone]        Fax: [phone]   RxID:   [id number] NICOTINE 14 MG/24HR PT24 (NICOTINE) change patch daily x 7 days  #7 x 0   Entered and Authorized by:   Newt Lukes MD   Signed by:   Newt Lukes MD on 03/09/2010   Method used:   Electronically to        [facility] (retail)       [address]       [place], [place]  [address]       Ph: [phone]  Fax: [phone]   RxID:   [id number] NICOTINE 21 MG/24HR PT24 (NICOTINE) change patch daily every 24h x 7days  #1 box x 0   Entered and Authorized by:   Newt Lukes MD   Signed by:   Newt Lukes MD on 03/09/2010   Method used:   Electronically to        [facility] (retail)       [address]       [place], [place]  [address]       Ph: [phone]       Fax: [phone]   RxID:   [id number] CIPROFLOXACIN HCL 500 MG TABS (CIPROFLOXACIN HCL) 1 by mouth two times a day x 3 days  #6 x 0   Entered and Authorized by:   Newt Lukes MD   Signed by:   Newt Lukes MD on 03/09/2010   Method used:   Electronically to        [facility] (retail)       [address]       [place], [place]  [address]       Ph: [phone]       Fax: [phone]   RxID:   [id number]   Laboratory Results   Urine Tests    Routine Urinalysis   Color: colorless Appearance: Clear Glucose: >=1000   (Normal Range: Negative) Bilirubin: negative   (Normal Range: Negative) Ketone: negative   (Normal Range: Negative) Spec. Gravity: <1.005   (Normal Range: 1.003-1.035) Blood: negative   (Normal Range: Negative) pH: 5.0   (Normal Range: 5.0-8.0) Protein: negative   (Normal Range: Negative) Urobilinogen: 0.2   (Normal Range: 0-1) Nitrite: negative   (Normal Range: Negative) Leukocyte Esterace: negative   (Normal Range: Negative)

## 2010-08-16 NOTE — Assessment & Plan Note (Signed)
Summary: SORE IN [facility]cie Ip /NWS   Vital Signs:  Patient profile:   35 year old female Height:      67.5 inches (171.45 cm) Weight:      126 pounds (57.27 kg) O2 Sat:      96 % Temp:     100.1 degrees F (37.83 degrees C) oral Pulse rate:   102 / minute Pulse rhythm:   regular BP sitting:   115 / 80  (left arm)  Vitals Entered By: Jarome Lamas (March 18, 2010 10:39 AM) CC: sore in grion area/pb Is Patient Diabetic? Yes   Referring Provider:  n/a Primary Provider:  Romero Belling, MD   CC:  sore in grion area/pb.  History of Present Illness: pt states few weeks of increase in the pain at the left inguinal area.  the pain is now moderate.  she has assoc myalgias.  she says she has been steadily been taking the doxycycline.  Current Medications (verified): 1)  Paxil 40 Mg  Tabs (Paroxetine Hcl) .... Qd 2)  Aurora Pen Needles 31g X 8 Mm Misc (Insulin Pen Needle) .... Use As Directed Pt Needs Fu Appt No Additional Refills 3)  Proventil Hfa 108 (90 Base) Mcg/act Aers (Albuterol Sulfate) .... 2 Puffs Q 4-6 Hours 4)  Omeprazole 20 Mg Cpdr (Omeprazole) .Marland Kitchen.. 1 By Mouth Once Daily 5)  Prodigy Blood Glucose Test  Strp (Glucose Blood) .... 6/day, and Lancets.  250.01.  Variable Glucoses. 6)  Advair Diskus 100-50 Mcg/dose Aepb (Fluticasone-Salmeterol) .Marland Kitchen.. 1 Puff Two Times A Day 7)  Novolog Flexpen 100 Unit/ml Soln (Insulin Aspart) .Marland Kitchen.. 10 Units Three Times A Day (Qac) 8)  Doxycycline Hyclate 100 Mg Tabs (Doxycycline Hyclate) .Marland Kitchen.. 1 Qd 9)  Ropinirole Hcl 0.5 Mg Tabs (Ropinirole Hcl) .Marland Kitchen.. 1 Tab At Bedtime 10)  Nicotine 14 Mg/24hr Pt24 (Nicotine) .... Change Patch Daily X 7 Days 11)  Nicotine 7 Mg/24hr Pt24 (Nicotine) .... Change Daily X 7 Days  Allergies (verified): 1)  ! Codeine  Past History:  Past Medical History: Last updated: 03/09/2010 GERD DIABETES MELLITUS, TYPE I (ICD-250.01) ASTHMA (ICD-493.90) Endometriosis/S/P HYSTERECTOMY SMOKER  Review of Systems  The patient  denies fever.    Physical Exam  General:  normal appearance.   Genitalia:  left vulvar/inguinal area:  has 6x4 cm area of erythema/warmth/tenderness.   Impression & Recommendations:  Problem # 1:  VULVAR ABSCESS (ICD-616.4) Assessment New  Other Orders: Est. Patient Level III (04540)  Patient Instructions: 1)  i called dr Ambrose Mantle.  he will see her now.

## 2010-08-16 NOTE — Assessment & Plan Note (Signed)
Summary: PER CHRISTY--MED FU--STC   Vital Signs:  Patient profile:   35 year old female Height:      67.5 inches (171.45 cm) Weight:      131.25 pounds (59.66 kg) O2 Sat:      98 % on Room air Temp:     97.0 degrees F (36.11 degrees C) oral Pulse rate:   84 / minute BP sitting:   118 / 76  (left arm) Cuff size:   regular  Vitals Entered By: Josph Macho CMA (August 25, 2009 11:07 AM)  O2 Flow:  Room air CC: Med follow up/ pt would like antibiotic for infection on Left inner thigh/ CF Is Patient Diabetic? Yes   Referring Provider:  n/a Primary Provider:  Romero Belling, MD   CC:  Med follow up/ pt would like antibiotic for infection on Left inner thigh/ CF.  History of Present Illness: see above.  pt states the infection has recurred since she has been off the antibiotic. no cbg record, but states cbg's are "high at bedtime." pt says she wants to quit smoking.  Current Medications (verified): 1)  Paxil 40 Mg  Tabs (Paroxetine Hcl) .... Qd 2)  Aurora Pen Needles 31g X 8 Mm Misc (Insulin Pen Needle) .... Use As Directed Pt Needs Fu Appt No Additional Refills 3)  Proventil Hfa 108 (90 Base) Mcg/act Aers (Albuterol Sulfate) .... 2 Puffs Q 4-6 Hours 4)  Omeprazole 20 Mg Cpdr (Omeprazole) .Marland Kitchen.. 1 By Mouth Once Daily 5)  Prodigy Blood Glucose Test  Strp (Glucose Blood) .... 6/day, and Lancets.  250.01.  Variable Glucoses. 6)  Advair Diskus 100-50 Mcg/dose Aepb (Fluticasone-Salmeterol) .Marland Kitchen.. 1 Puff Two Times A Day 7)  Ampicillin 250 Mg Caps (Ampicillin) .... Take One Tab 4 Times A Day For 7 Days and Then Every Other Week 8)  Novolog Flexpen 100 Unit/ml Soln (Insulin Aspart) .Marland Kitchen.. 10 Units Three Times A Day (Qac)  Allergies (verified): 1)  ! Codeine  Past History:  Past Medical History: Last updated: 03/29/2009 GERD DIABETES MELLITUS, TYPE I (ICD-250.01) ASTHMA (ICD-493.90) Endometriosis/S/P HYSTERECTOMY SMOKER  Review of Systems  The patient denies hypoglycemia and  fever.    Physical Exam  General:  normal appearance.   Skin:  left inguinal area: has 3x30 mm area of tenderness and slight swelling.  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 1 qd 2)  Chantix Starting Month Pak 0.5 Mg X 11 & 1 Mg X 42 Tabs (Varenicline tartrate) .... As dir.  refill with continuing packs  Other Orders: TLB-Lipid Panel (80061-LIPID) TLB-BMP (Basic Metabolic Panel-BMET) (80048-METABOL) TLB-CBC Platelet - w/Differential (85025-CBCD) TLB-Hepatic/Liver Function Pnl (80076-HEPATIC) TLB-TSH (Thyroid Stimulating Hormone) (84443-TSH) TLB-A1C / Hgb A1C (Glycohemoglobin) (83036-A1C) TLB-Microalbumin/Creat Ratio, Urine (82043-MALB) TLB-Udip ONLY (81003-UDIP) Est. Patient Level IV (40347)  Patient Instructions: 1)  resume doxycycline 100 mg once daily for chronic suppression. 2)  tests are being ordered for you today.  a few days after the test(s), please call [phone] to hear your test results. 3)  i sent a prescription for chantix to your pharmacy.  carefully watch for depression.  stop it and call me if this side-effect happens. 4)  call (800) quit now. 5)  pending  the test results, please continue the medications as listed below. 6)  check your blood sugar 6 times a day.  vary the time of day when you check, between before the 3 meals, and at bedtime.  also check if you have symptoms of your blood sugar being too high or too low.  please keep a record of the readings and bring it to your next appointment here.  please call us sooner if you are having low blood sugar episodes. 7)  Please schedule a follow-up appointment in 1 month. Prescriptions: OMEPRAZOLE 20 MG CPDR  (OMEPRAZOLE) 1 by mouth once daily  #30 x 11   Entered and Authorized by:   Minus Breeding MD   Signed by:   Minus Breeding MD on 08/25/2009   Method used:   Electronically to        [facility] (retail)       [address]       [place], [place]  [address]       Ph: [phone]       Fax: [phone]   RxID:   [id number] NOVOLOG FLEXPEN 100 UNIT/ML SOLN (INSULIN ASPART) 10 units three times a day (qac)  #10 cc x 11   Entered and Authorized by:   Minus Breeding MD   Signed by:   Minus Breeding MD on 08/25/2009   Method used:   Electronically to        [facility] (retail)       [address]       [place], [place]  [address]       Ph: [phone]       Fax: [phone]   RxID:   [id number] PRODIGY BLOOD GLUCOSE TEST  STRP (GLUCOSE BLOOD) 6/day, and lancets.  250.01.  variable glucoses.  #180 x 11   Entered and Authorized by:   Minus Breeding MD   Signed by:   Minus Breeding MD on 08/25/2009   Method used:   Electronically to        [facility] (retail)       [address]       [place], [place]  [address]       Ph: [phone]       Fax: [phone]   RxID:   [id number] PAXIL 40 MG  TABS (PAROXETINE HCL) qd  #30 Each x 11   Entered and Authorized by:   Minus Breeding MD   Signed by:   Minus Breeding MD on 08/25/2009   Method used:   Electronically to        [facility] (retail)       [address]       [place], [place]  [address]       Ph: [phone]       Fax: [phone]   RxID:   [id number] CHANTIX STARTING MONTH PAK 0.5 MG X 11 & 1 MG X 42 TABS (VARENICLINE TARTRATE) as dir.  refill with continuing packs  #1 month x 5   Entered and Authorized by:   Minus Breeding MD   Signed by:   Minus Breeding MD on 08/25/2009   Method used:   [facility]       [address]        [place], [place]  [address]       Ph: [phone]       Fax: [phone]   RxID:   [id number] DOXYCYCLINE HYCLATE 100 MG TABS (DOXYCYCLINE HYCLATE) 1 qd  #90 x 3   Entered and Authorized by:   Minus Breeding MD   Signed by:   Minus Breeding MD on 08/25/2009   Method used:   Electronically to        [facility] (retail)       [address]       [place], [place]  [address]       Ph: [phone]       Fax: [phone]   RxID:   [id number]

## 2010-08-16 NOTE — Progress Notes (Signed)
Summary: PA-Advair Diskus 150-50  Phone Note From Pharmacy   Summary of Call: PA-Advair Diskus 100-50,  called medicaid approved 02/28/10-02/27/10, pt aware . Initial call taken by: Dagoberto Reef,  February 28, 2010 3:23 PM

## 2010-08-16 NOTE — Progress Notes (Signed)
  Phone Note Other Incoming   Request: Send information Summary of Call: Request for records received from [facility]. Request forwarded to [facility] for copies of all of patients records.

## 2010-08-16 NOTE — Medication Information (Signed)
Summary: Prior [facility]ho for Advair[facility]  Prior [facility]ho for [facility]   Imported By: Sherian Rein 03/02/2010 08:31:44  _____________________________________________________________________  External Attachment:    Type:   Image     Comment:   External Document

## 2010-08-16 NOTE — Assessment & Plan Note (Signed)
Summary: sugar/#/cd   Vital Signs:  Patient profile:   35 year old female Height:      67.5 inches (171.45 cm) Weight:      124 pounds (56.36 kg) BMI:     19.20 O2 Sat:      97 % on Room air Temp:     99.2 degrees F (37.33 degrees C) oral Pulse rate:   97 / minute BP sitting:   122 / 76  (left arm) Cuff size:   regular  Vitals Entered By: Brenton Grills MA (February 18, 2010 1:16 PM)  O2 Flow:  Room air CC: Pt here to discuss CBG's/pt states she is no longer taking Proventil,Advair, Ampicillin, Chantix, and is not using the Pen needles/aj   Referring Provider:  n/a Primary Provider:  Romero Belling, MD   CC:  Pt here to discuss CBG's/pt states she is no longer taking Proventil, Advair, Ampicillin, Chantix, and and is not using the Pen needles/aj.  History of Present Illness: the status of at least 3 ongoing medical problems is addressed today: asthma:  pt was seen in er 4 weeks ago for acute bronchitis.  she feels better, but still has wheezing.  she takes only proventil as needed, but needs it daily. dm:  no cbg record, but states cbg's are 200-300, with no trend throughout the day.  she has hypoglycemia, when she takes extra insulin for elevated cbg.  she insists cbg is no higher in am than at hs, even though she takes only fast-acting insulin.   insomnia:  she attributes to "restless legs." folliculitis: worse since doxycycline had to be reduced (she is now finished with ampicillin).  Current Medications (verified): 1)  Paxil 40 Mg  Tabs (Paroxetine Hcl) .... Qd 2)  Aurora Pen Needles 31g X 8 Mm Misc (Insulin Pen Needle) .... Use As Directed Pt Needs Fu Appt No Additional Refills 3)  Proventil Hfa 108 (90 Base) Mcg/act Aers (Albuterol Sulfate) .... 2 Puffs Q 4-6 Hours 4)  Omeprazole 20 Mg Cpdr (Omeprazole) .Marland Kitchen.. 1 By Mouth Once Daily 5)  Prodigy Blood Glucose Test  Strp (Glucose Blood) .... 6/day, and Lancets.  250.01.  Variable Glucoses. 6)  Advair Diskus 100-50 Mcg/dose Aepb  (Fluticasone-Salmeterol) .Marland Kitchen.. 1 Puff Two Times A Day 7)  Ampicillin 250 Mg Caps (Ampicillin) .... Take One Tab 4 Times A Day For 7 Days and Then Every Other Week 8)  Novolog Flexpen 100 Unit/ml Soln (Insulin Aspart) .Marland Kitchen.. 10 Units Three Times A Day (Qac) 9)  Doxycycline Hyclate 100 Mg Tabs (Doxycycline Hyclate) .Marland Kitchen.. 1 Qd 10)  Chantix Starting Month Pak 0.5 Mg X 11 & 1 Mg X 42 Tabs (Varenicline Tartrate) .... As Dir.  Refill With Continuing Packs  Allergies (verified): 1)  ! Codeine  Past History:  Past Medical History: Last updated: 03/29/2009 GERD DIABETES MELLITUS, TYPE I (ICD-250.01) ASTHMA (ICD-493.90) Endometriosis/S/P HYSTERECTOMY SMOKER  Past Surgical History: Reviewed history from 02/16/2009 and no changes required. COLONOSCOPY 5/09 ,INT HEMORRHOIDS HYSTERECTOMY/BSO 2009  Review of Systems  The patient denies fever, syncope, and depression.    Physical Exam  General:  normal appearance.   Lungs:  Clear to auscultation bilaterally, except for a few exp wheezes.. Normal respiratory effort.    Impression & Recommendations:  Problem # 1:  DIABETES MELLITUS, TYPE I (ICD-250.01) uncertain control  Problem # 2:  FOLLICULITIS (ICD-704.8) Assessment: Deteriorated  Problem # 3:  ASTHMATIC BRONCHITIS, ACUTE (ICD-466.0) recurrent  Problem # 4:  INSOMNIA (ICD-780.52) needs increased rx  Medications Added to Medication List This Visit: 1)  Ropinirole Hcl 0.5 Mg Tabs (Ropinirole hcl) .Marland Kitchen.. 1 tab at bedtime  Other Orders: Est. Patient Level IV (81191)  Patient Instructions: 1)  resume advair-100, 1 puff two times a day. 2)  continue proventil for as needed use. 3)  check your blood sugar 4 times a day--before the 3 meals, and at bedtime.  also check if you have symptoms of your blood sugar being too high or too low.  please keep a record of the readings and bring it to your next appointment here.  please call us sooner if you are having low blood sugar episodes. 4)   you should not exceed insulin dosage.   5)  ropinirole 0.5 mg at bedtime 6)  increase exercise. 7)  increase docycycline back to 100 mg once daily. 8)  Please schedule a follow-up appointment in 1 month. Prescriptions: DOXYCYCLINE HYCLATE 100 MG TABS (DOXYCYCLINE HYCLATE) 1 qd  #30 x 11   Entered and Authorized by:   Minus Breeding MD   Signed by:   Minus Breeding MD on 02/18/2010   Method used:   Electronically to        [facility] (retail)       [address]       [place], [place]  [address]       Ph: [phone]       Fax: [phone]   RxID:   [id number] ROPINIROLE HCL 0.5 MG TABS (ROPINIROLE HCL) 1 tab at bedtime  #30 x 1   Entered and Authorized by:   Minus Breeding MD   Signed by:   Minus Breeding MD on 02/18/2010   Method used:   Electronically to        [facility] (retail)       [address]       [place], [place]  [address]       Ph: [phone]       Fax: [phone]   RxID:   [id number] ADVAIR DISKUS 100-50 MCG/DOSE AEPB (FLUTICASONE-SALMETEROL) 1 puff two times a day  #1 device x 11   Entered and Authorized by:   Minus Breeding MD   Signed by:   Minus Breeding MD on 02/18/2010   Method used:   Electronically to        [facility] (retail)       [address]       [place], [place]  [address]       Ph: [phone]       Fax: [phone]   RxID:   [id number] PROVENTIL HFA 108 (90 BASE) MCG/ACT AERS (ALBUTEROL SULFATE) 2 puffs q 4-6 hours  #1 x 3   Entered and Authorized by:   Minus Breeding MD   Signed by:   Minus Breeding MD on 02/18/2010   Method used:   Electronically to        [facility] (retail)       [address]       [place], [place]  [address]       Ph: [phone]       Fax: [phone]   RxID:   [id number]

## 2010-08-16 NOTE — Progress Notes (Signed)
Summary: rx question  Phone Note Call from Patient Call back at Home Phone [phone]   Caller: Patient Call For: Minus Breeding MD Summary of Call: Pt left message on triage B requesting a call back from a nurse about a medication. Please call pt. Initial call taken by: Verdell Face,  March 18, 2010 8:36 AM  Follow-up for Phone Call        pt states that sore in groin area is not any better since starting the doxycycline. she states that the area is still sore and it hurts to walk. pt is asking if doxcycline dosage can be increased please advise Follow-up by: Brenton Grills MA,  March 18, 2010 8:41 AM  Additional Follow-up for Phone Call Additional follow up Details #1::        please advise ov this am.  i can add her on. Additional Follow-up by: Minus Breeding MD,  March 18, 2010 8:44 AM    Additional Follow-up for Phone Call Additional follow up Details #2::    pt will come in at 9:45am Follow-up by: Brenton Grills MA,  March 18, 2010 8:50 AM

## 2010-08-16 NOTE — Assessment & Plan Note (Signed)
Summary: consult for dyspnea   Visit Type:  Initial Consult Copy to:  Romero Belling MD Primary Provider/Referring Provider:  Romero Belling, MD   CC:  Pulmonary Consult. pt c/o sob w/ activity, chest pains, and cough w/ yellow phlem.  History of Present Illness: the patient is a 35 year old female who I've been asked to see for dyspnea.  The patient states that she has had breathing issues 10 years, and feels they're getting worse.  She notes dyspnea with walking a few blocks, and also doing her housework.  She has also noticed that whenever she gets a cold,  she gets more short of breath and wheezy.  She has a long history of smoking, and currently is smoking 2 packs per day.  She has noticed that her breathing has significantly improved since decreasing her number of cigarettes smoked.  She has a mild cough with intermittent mucus production that varies from clear to discolored.  She has had spirometry which shows a normal FEV1 percent, but her flow volume loop shows very subtle airflow obstruction.  She has had a chest x-ray which shows mildly prominent bronchovascular markings, but no acute process.  She did have a CT chest in July of this year which was unremarkable.  She has been started on Advair which she takes only as needed.  She complains this makes her shaky.  She has no history of asthma in the past or during childhood.  Preventive Screening-Counseling & Management  Alcohol-Tobacco     Smoking Status: current     Smoking Cessation Counseling: yes     Packs/Day: 1.5     Tobacco Counseling: to quit use of tobacco products  Current Medications (verified): 1)  Paxil 40 Mg  Tabs (Paroxetine Hcl) .... Qd 2)  Aurora Pen Needles 31g X 8 Mm Misc (Insulin Pen Needle) .... Use As Directed Pt Needs Fu Appt No Additional Refills 3)  Proventil Hfa 108 (90 Base) Mcg/act Aers (Albuterol Sulfate) .... 2 Puffs Q 4-6 Hours 4)  Omeprazole 20 Mg Cpdr (Omeprazole) .Marland Kitchen.. 1 By Mouth Once Daily 5)  Prodigy  Blood Glucose Test  Strp (Glucose Blood) .... 6/day, and Lancets.  250.01.  Variable Glucoses. 6)  Advair Diskus 100-50 Mcg/dose Aepb (Fluticasone-Salmeterol) .Marland Kitchen.. 1 Puff Two Times A Day 7)  Novolog Flexpen 100 Unit/ml Soln (Insulin Aspart) .Marland Kitchen.. 10 Units Three Times A Day (Qac) 8)  Doxycycline Hyclate 100 Mg Tabs (Doxycycline Hyclate) .... Once Daily 9)  Ropinirole Hcl 0.5 Mg Tabs (Ropinirole Hcl) .Marland Kitchen.. 1 Tab At Bedtime  Allergies (verified): 1)  ! Codeine  Past History:  Past Medical History: GERD DIABETES MELLITUS, TYPE I (ICD-250 Endometriosis/S/P HYSTERECTOMY  Past Surgical History: Reviewed history from 02/16/2009 and no changes required. COLONOSCOPY 5/09 ,INT HEMORRHOIDS HYSTERECTOMY/BSO 2009  Family History: Reviewed history from 12/21/2008 and no changes required. No FH of Colon Cancer: Family History of Diabetes: mother, grandfather maternal, aunt maternal Emphysema: m aunt, m uncle lung cancer--pgm  Social History: Reviewed history from 05/12/2010 and no changes required. Separated. 2 children--pt has custody.  husb pays child support. Patient currently smokes. 1-2 ppds. started age 4 Alcohol Use - no Daily Caffeine Use     3 2 liters a day    Illicit Drug Use - no Patient does not get regular exercise.  Packs/Day:  1.5  Review of Systems       The patient complains of shortness of breath with activity, productive cough, non-productive cough, and chest pain.  The patient denies shortness  of breath at rest, coughing up blood, irregular heartbeats, acid heartburn, indigestion, loss of appetite, weight change, abdominal pain, difficulty swallowing, sore throat, tooth/dental problems, headaches, nasal congestion/difficulty breathing through nose, sneezing, itching, ear ache, anxiety, depression, hand/feet swelling, joint stiffness or pain, rash, change in color of mucus, and fever.    Vital Signs:  Patient profile:   35 year old female Height:      67.5  inches Weight:      134.13 pounds BMI:     20.77 O2 Sat:      99 % on Room air Temp:     98.3 degrees F oral Pulse rate:   86 / minute BP sitting:   142 / 84  (left arm) Cuff size:   regular  Vitals Entered By: Carver Fila (June 08, 2010 2:40 PM)  O2 Flow:  Room air CC: Pulmonary Consult. pt c/o sob w/ activity, chest pains, cough w/ yellow phlem Comments meds and allergies updated Phone number updated Carver Fila  June 08, 2010 2:40 PM    Physical Exam  General:  thin female in nad Eyes:  PERRLA and EOMI.   Nose:  narrowed bilat, but patent. no purulence noted. Mouth:  clear, no exudates or lesions noted. Neck:  no jvd, tmg, LN Lungs:  totally clear to auscultation Heart:  rrr, no mrg Abdomen:  soft and nontender, bs+ Extremities:  no edema or cyanosis, pulses intact distally Neurologic:  alert and oriented, moves all 4.   Impression & Recommendations:  Problem # 1:  AIRWAY INFLAMMATION (ICD-519.9)  the pt does not have any significant airflow obstruction by spirometry (and therefore does not have COPD), but does have ongoing airway inflammation related to her smoking.  Her cxr shows prominent BV markings, often seen in bronchial inflammation.  I have told the patient I suspect all of her symptoms are related to her ongoing cigarette smoking.  I would like her to stay on Advair until she is able to totally quit.  We have discussed various smoking cessation techniques, and she has had some success with the patches.  She understands that if she continues to smoke, she will have recurret pulmonary infections and at some point develop obstructive lung disease.  Medications Added to Medication List This Visit: 1)  Doxycycline Hyclate 100 Mg Tabs (Doxycycline hyclate) .... Once daily 2)  Nicotine 21 Mg/24hr Pt24 (Nicotine) .... One locally every 24hrs for next one week.  then begin the 14mg  patch  Other Orders: Consultation Level IV (54098) Tobacco use cessation  intermediate 3-10 minutes (11914)  Patient Instructions: 1)  stop smoking!!  all of your symptoms will go away if you do. 2)  stay on advair until you quit smoking 100%.   3)  will give you a prescription for 21mg  nicotine patch for one week to start over on your taper. 4)  followup with me as needed.  Prescriptions: NICOTINE 21 MG/24HR PT24 (NICOTINE) one locally every 24hrs for next one week.  then begin the 14mg  patch  #7 x 0   Entered and Authorized by:   Barbaraann Share MD   Signed by:   Barbaraann Share MD on 06/08/2010   Method used:   Print then Give to Patient   RxID:   [id number]

## 2010-08-18 NOTE — Progress Notes (Signed)
Summary: OV due  Phone Note Outgoing Call Call back at [facility] Phone [phone]   Call placed by: Brenton Grills CMA Duncan Dull),  August 08, 2010 2:15 PM Summary of Call: Per MD, pt is due for F/U OV.  Follow-up for Phone Call        Appointment scheduled 08/10/2010 9:15am Follow-up by: Brenton Grills CMA Duncan Dull),  August 08, 2010 2:18 PM

## 2010-09-09 ENCOUNTER — Ambulatory Visit (INDEPENDENT_AMBULATORY_CARE_PROVIDER_SITE_OTHER): Payer: Medicaid Other | Admitting: Endocrinology

## 2010-09-09 ENCOUNTER — Encounter: Payer: Self-pay | Admitting: Endocrinology

## 2010-09-09 DIAGNOSIS — E109 Type 1 diabetes mellitus without complications: Secondary | ICD-10-CM

## 2010-09-13 NOTE — Assessment & Plan Note (Signed)
Summary: PER PT SORE ON GROIN --STC   Vital Signs:  Patient profile:   35 year old female Height:      67.5 inches (171.45 cm) Weight:      132.25 pounds (60.11 kg) BMI:     20.48 O2 Sat:      97 % on Room air Temp:     98.3 degrees F (36.83 degrees C) oral Pulse rate:   100 / minute Pulse rhythm:   regular BP sitting:   114 / 62  (left arm) Cuff size:   regular  Vitals Entered By: Brenton Grills CMA Duncan Dull) (September 09, 2010 9:03 AM)  O2 Flow:  Room air CC: Sore on groin area on left side/aj Is Patient Diabetic? Yes   Referring Provider:  Romero Belling MD Primary Provider:  Romero Belling, MD   CC:  Sore on groin area on left side/aj.  History of Present Illness: pt states few weeks of recurrent pain at the left inguinal area, and assoc purulent drainage.  this happens despite the doxycycline once daily. no cbg record, but states cbg's are well-controlled.  Current Medications (verified): 1)  Paxil 40 Mg  Tabs (Paroxetine Hcl) .... Qd 2)  Aurora Pen Needles 31g X 8 Mm Misc (Insulin Pen Needle) .... Use As Directed Pt Needs Fu Appt No Additional Refills 3)  Proventil Hfa 108 (90 Base) Mcg/act Aers (Albuterol Sulfate) .... 2 Puffs Q 4-6 Hours 4)  Omeprazole 20 Mg Cpdr (Omeprazole) .Marland Kitchen.. 1 By Mouth Once Daily 5)  Prodigy Blood Glucose Test  Strp (Glucose Blood) .... 6/day, and Lancets.  250.01.  Variable Glucoses. 6)  Advair Diskus 100-50 Mcg/dose Aepb (Fluticasone-Salmeterol) .Marland Kitchen.. 1 Puff Two Times A Day 7)  Novolog Flexpen 100 Unit/ml Soln (Insulin Aspart) .Marland Kitchen.. 10 Units Three Times A Day (Qac) 8)  Doxycycline Hyclate 100 Mg Tabs (Doxycycline Hyclate) .... Once Daily 9)  Ropinirole Hcl 0.5 Mg Tabs (Ropinirole Hcl) .Marland Kitchen.. 1 Tab At Bedtime 10)  Nicotine 21 Mg/24hr Pt24 (Nicotine) .... One Locally Every 24hrs For Next One Week.  Then Begin The 14mg  Patch  Allergies (verified): 1)  ! Codeine  Past History:  Past Medical History: Last updated: 06/08/2010 GERD DIABETES MELLITUS,  TYPE I (ICD-250 Endometriosis/S/P HYSTERECTOMY  Review of Systems  The patient denies fever.         denies hypoglycemia  Physical Exam  General:  normal appearance.   Genitalia:  left inguinal area:  has slight swelling, but no drainage now.  no erythema or fluctuance.   Pulses:  dorsalis pedis intact bilat. Extremities:  no deformity.  no ulcer on the feet.  feet are of normal color and temp.  no edema  Neurologic:  sensation is intact to touch on the feet    Impression & Recommendations:  Problem # 1:  DIABETES MELLITUS, TYPE I (ICD-250.01) uncertain control  Problem # 2:  inguinal ABSCESS (ICD-616.4)  Medications Added to Medication List This Visit: 1)  Doxycycline Hyclate 100 Mg Tabs (Doxycycline hyclate) .Marland Kitchen.. 1 tab two times a day  Other Orders: TLB-A1C / Hgb A1C (Glycohemoglobin) (83036-A1C) Est. Patient Level IV (60454)  Patient Instructions: 1)  for now, increase doxycycline to two times a day 2)  please see dr Ambrose Mantle soon, to see if further opening of the area would help it.   3)  blood tests are being ordered for you today.  please call [phone] to hear your test results. 4)  pending the test results, please continue the same insulin for  now. 5)  check your blood sugar 2 times a day.  vary the time of day when you check, between before the 3 meals, and at bedtime.  also check if you have symptoms of your blood sugar being too high or too low.  please keep a record of the readings and bring it to your next appointment here.  please call us sooner if you are having low blood sugar episodes. 6)  Please schedule a follow-up appointment in 1 month. Prescriptions: DOXYCYCLINE HYCLATE 100 MG TABS (DOXYCYCLINE HYCLATE) 1 tab two times a day  #60 x 11   Entered and Authorized by:   Minus Breeding MD   Signed by:   Minus Breeding MD on 09/09/2010   Method used:   Electronically to        [facility] (retail)       [address]       [place], [place]  [address]       Ph: [phone]       Fax: [phone]   RxID:   [id number]    Orders Added: 1)  TLB-A1C / Hgb A1C (Glycohemoglobin) [83036-A1C] 2)  Est. Patient Level IV [84132]

## 2010-09-20 ENCOUNTER — Telehealth: Payer: Self-pay | Admitting: Endocrinology

## 2010-09-27 LAB — GLUCOSE, CAPILLARY: Glucose-Capillary: 283 mg/dL — ABNORMAL HIGH (ref 70–99)

## 2010-09-27 LAB — POCT I-STAT 4, (NA,K, GLUC, HGB,HCT)
Glucose, Bld: 268 mg/dL — ABNORMAL HIGH (ref 70–99)
HCT: 44 % (ref 36.0–46.0)
Hemoglobin: 15 g/dL (ref 12.0–15.0)
Potassium: 4.1 mEq/L (ref 3.5–5.1)
Sodium: 140 mEq/L (ref 135–145)

## 2010-09-27 NOTE — Progress Notes (Signed)
Summary: Rx req  Phone Note Call from Patient Call back at Work Phone [phone]   Caller: Patient Summary of Call: Pt called requesting cream Rx to treat yeast infection Initial call taken by: Margaret Pyle, CMA,  September 20, 2010 1:57 PM  Follow-up for Phone Call        cream is non-prescription.  do you need a prescription pill for this? Follow-up by: Minus Breeding MD,  September 20, 2010 2:03 PM  Additional Follow-up for Phone Call Additional follow up Details #1::        Pt request pill. Margaret Pyle, CMA  September 20, 2010 2:18 PM  Additional Follow-up by: Minus Breeding MD,  September 20, 2010 3:22 PM    Additional Follow-up for Phone Call Additional follow up Details #2::    i sent rx Follow-up by: Minus Breeding MD,  September 20, 2010 3:23 PM  Additional Follow-up for Phone Call Additional follow up Details #3:: Details for Additional Follow-up Action Taken: Pt advised via VM. Additional Follow-up by: Margaret Pyle, CMA,  September 20, 2010 3:29 PM  New/Updated Medications: FLUCONAZOLE 150 MG TABS (FLUCONAZOLE) 1 tab once daily Prescriptions: FLUCONAZOLE 150 MG TABS (FLUCONAZOLE) 1 tab once daily  #3 x 1   Entered and Authorized by:   Minus Breeding MD   Signed by:   Minus Breeding MD on 09/20/2010   Method used:   Electronically to        [facility] (retail)       [address]       [place], [place]  [address]       Ph: [phone]       Fax: [phone]   RxID:   [id number]

## 2010-10-02 LAB — BASIC METABOLIC PANEL
CO2: 28 mEq/L (ref 19–32)
Calcium: 9.7 mg/dL (ref 8.4–10.5)
Chloride: 99 mEq/L (ref 96–112)
Creatinine, Ser: 0.55 mg/dL (ref 0.4–1.2)
Glucose, Bld: 172 mg/dL — ABNORMAL HIGH (ref 70–99)

## 2010-10-02 LAB — DIFFERENTIAL
Basophils Relative: 1 % (ref 0–1)
Eosinophils Absolute: 0.1 10*3/uL (ref 0.0–0.7)
Eosinophils Relative: 1 % (ref 0–5)
Monocytes Relative: 5 % (ref 3–12)
Neutrophils Relative %: 68 % (ref 43–77)

## 2010-10-02 LAB — CBC
MCH: 31.9 pg (ref 26.0–34.0)
MCHC: 35 g/dL (ref 30.0–36.0)
MCV: 91 fL (ref 78.0–100.0)
Platelets: 191 10*3/uL (ref 150–400)

## 2010-10-12 ENCOUNTER — Other Ambulatory Visit: Payer: Self-pay | Admitting: Endocrinology

## 2010-10-31 ENCOUNTER — Ambulatory Visit: Payer: Medicaid Other | Admitting: Endocrinology

## 2010-10-31 DIAGNOSIS — Z0289 Encounter for other administrative examinations: Secondary | ICD-10-CM

## 2010-10-31 LAB — URINALYSIS, ROUTINE W REFLEX MICROSCOPIC
Glucose, UA: >1000 mg/dL — AB
Leukocytes, UA: NEGATIVE
Protein, ur: NEGATIVE mg/dL
pH: 5.5 (ref 5.0–8.0)

## 2010-10-31 LAB — URINE MICROSCOPIC-ADD ON

## 2010-10-31 LAB — GLUCOSE, CAPILLARY: Glucose-Capillary: 260 mg/dL — ABNORMAL HIGH (ref 70–99)

## 2010-11-04 ENCOUNTER — Ambulatory Visit: Payer: Medicaid Other | Admitting: Endocrinology

## 2010-11-10 ENCOUNTER — Encounter: Payer: Self-pay | Admitting: Endocrinology

## 2010-11-10 ENCOUNTER — Ambulatory Visit (INDEPENDENT_AMBULATORY_CARE_PROVIDER_SITE_OTHER): Payer: Medicaid Other | Admitting: Endocrinology

## 2010-11-10 DIAGNOSIS — IMO0001 Reserved for inherently not codable concepts without codable children: Secondary | ICD-10-CM

## 2010-11-10 DIAGNOSIS — E109 Type 1 diabetes mellitus without complications: Secondary | ICD-10-CM

## 2010-11-10 DIAGNOSIS — M791 Myalgia, unspecified site: Secondary | ICD-10-CM

## 2010-11-10 MED ORDER — ROPINIROLE HCL 1 MG PO TABS: 1.0000 mg | ORAL_TABLET | Freq: Every day | ORAL | Status: DC

## 2010-11-10 NOTE — Patient Instructions (Addendum)
blood tests are being ordered for you today.  please call [phone] to hear your test results. Increase ropinirole to 1 mg at night. Please make a follow-up appointment in 1 month check your blood sugar 2 times a day.  vary the time of day when you check, between before the 3 meals, and at bedtime.  also check if you have symptoms of your blood sugar being too high or too low.  please keep a record of the readings and bring it to your next appointment here.  please call us sooner if you are having low blood sugar episodes. good diet and exercise habits significanly improve the control of your diabetes.  please let me know if you wish to be referred to a dietician.  high blood sugar is very risky to your health.  you should see an eye doctor every year. controlling your blood pressure and cholesterol drastically reduces the damage diabetes does to your body.  this also applies to quitting smoking.  please discuss these with your doctor.

## 2010-11-10 NOTE — Progress Notes (Signed)
  Subjective:    Patient ID: [name], female    DOB: Jun 15, 1976, 35 y.o.   MRN: [id number]  HPI Pt states few days of moderate myalgias throughout the body  It is worst at the thighs and upper arms.  No assoc mumbness. no cbg record, but states cbg's are "around 200." she says ropinirole does not help rls Past Medical History  Diagnosis Date  . DIABETES MELLITUS, TYPE I 02/02/2007  . Anxiety state, unspecified 03/26/2009  . DEPRESSION 02/16/2009  . ASTHMA 02/02/2007  . Shortness of breath 09/25/2007  . GERD 12/21/2008  . Endometriosis    Past Surgical History  Procedure Date  . Abdominal hysterectomy 2009    BSO    reports that she has been smoking.  She does not have any smokeless tobacco history on file. She reports that she does not drink alcohol or use illicit drugs. family history includes Cancer in her paternal grandmother; Diabetes in her maternal aunt, maternal grandfather, and mother; and Emphysema in her maternal aunt and maternal uncle. Allergies  Allergen Reactions  . Codeine     REACTION: NAUSEA   Review of Systems denies hypoglycemia.  She reports weight gain.  No rash.    Objective:   Physical Exam GENERAL: no distress Msk:  There is tenderness of the areas noted above. Pulses: dorsalis pedis intact bilat.   Feet: no deformity.  no ulcer on the feet.  feet are of normal color and temp.  no edema Neuro: sensation is intact to touch on the feet.   Assessment & Plan:  Dm, therapy limited by noncompliance.  i'll do the best i can. Restless leg syndrome Myalgias, uncertain etiology

## 2010-11-29 NOTE — H&P (Signed)
[name], [name]            ACCOUNT NO.:  [id number]   MEDICAL RECORD NO.:  [id number]          PATIENT TYPE:  AMB   LOCATION:  SDC                           FACILITY:  [facility]   PHYSICIAN:  Malachi Pro. Ambrose Mantle, M.D. DATE OF BIRTH:  22-Nov-1975   DATE OF ADMISSION:  DATE OF DISCHARGE:                              HISTORY & PHYSICAL   PRESENT ILLNESS:  This is a 35 year old white separated female para, 2-0-  0-2, admitted for hysterectomy and sling procedure because of  menorrhagia, dysmenorrhea, dyspareunia, stress urinary incontinence and  painful bowel movements.  Last menstrual period December 23, 2007.  The  patient's periods occur every 28 days, last 7 days.  She states that she  has heavy flow, but she only uses 4 pads per day.  She claims that she  has a 2- to 3-year history of pain in her pelvis all the time, but it is  10 out of 10 with her menses.  She has pain with sex, although at the  present time she is not having intercourse.  She also has pain as bowel  movements pass a certain point, but she has been evaluated with a  colonoscopy by Dr. Barnet Pall, and her colonoscopy is normal except for  internal hemorrhoids.  On physical exam for several years, she has had  pain on manipulation of her uterus.  She has had documented herpes and  VIN-3, although I do not think she has any history of positive gonorrhea  or chlamydia cultures.   HER PAST MEDICAL HISTORY REVEALS ALLERGY TO CODEINE.   OPERATION:  She did have a tonsillectomy at age 33.   ILLNESSES:  She is a type 1 diabetic.  She also has a history of VIN-3,  condyloma.   Alcohol none.  Tobacco one and a half packs per day.  She has no known  heart or lung problems.  No headache problems.  No problem with her  bowels other than pain with bowel movements as the bowel movement passes  a point in the pelvis.   She is unemployed at the present time.   Her father is 74 years old and healthy.  Her mother is in her 49s with  diabetes.  Siblings:  She does have a 13 year old brother with diabetes.  She also has a 106 year old brother who is healthy.   MEDICATION:  She takes Paxil 40 mg every day.  She takes insulin Humalog  5-7 units with each meal and 5-7 units at bedtime.  She has had 2  children, a 61 year old and 18-year-old girl and boy, respectively.   PHYSICAL EXAMINATION:  HEAD, EYES, EARS, NOSE AND THROAT:  Reveal no  cranial abnormalities.  Blood pressure is 118/68, the pulse is 80, temperature is 99.1,  respirations 16.  Weight is 119 pounds.  The thyroid shows a small nodule in the left lower lobe of the thyroid  gland.  BREASTS:  Are soft without masses.  LUNGS:  Are clear to auscultation.  HEART:  Normal size and sounds.  No murmurs.  ABDOMEN:  Is soft and nontender.  Liver, spleen and kidneys  are not  felt.  Vulva and vagina are clean.  The cervix is clean.  Uterus is anterior  and normal size, quite tender to motion.  The adnexa are free of masses.  Rectal vaginal exam does not disclosing any rectovaginal nodularity.   ADMITTING IMPRESSIONS:  1. Dysmenorrhea.  2. Menorrhagia.  3. Pelvic pain.  4. Dyspareunia.  5. Stress urinary incontinence.   The patient is admitted for laparoscopic vaginal hysterectomy.  The  uterus feels like it could be easily removed vaginally.  However, I want  to look at her pelvis because of her history of chronic pain.  Dr.  Vernie Ammons is going to do a sling procedure for her stress urinary  incontinence.  He has evaluated her and felt that she is a candidate for  the sling procedure.  The patient has been informed of the risks of  surgery including but not limited to heart attack, stroke pulmonary  embolus, wound disruption, hemorrhage with need for reoperation and/or  transfusion, fistula formation, nerve injury, intestinal obstruction.  She understands the goals of the surgery are to eliminate her painful  periods and heavy bleeding.  The success of  eliminating dyspareunia and  the pelvic pain throughout the month are less certain, and the pain with  bowel movements is also less certain.  Hopefully, the stress urinary  incontinence will be corrected.  She understands and agrees to proceed.      Malachi Pro. Ambrose Mantle, M.D.  Electronically Signed     TFH/MEDQ  D:  01/14/2008  T:  01/14/2008  Job:  [id number]

## 2010-11-29 NOTE — Discharge Summary (Signed)
NAMETAKESHA, Graham            ACCOUNT NO.:  [id number]   MEDICAL RECORD NO.:  [id number]          PATIENT TYPE:  OIB   LOCATION:  9309                          FACILITY:  [facility]   PHYSICIAN:  Malachi Pro. Ambrose Mantle, M.D. DATE OF BIRTH:  04-19-1976   DATE OF ADMISSION:  01/15/2008  DATE OF DISCHARGE:  01/16/2008                               DISCHARGE SUMMARY   This is a 35 year old white female admitted for a laparoscopic-assisted  vaginal hysterectomy and a sling procedure because of menorrhagia,  dysmenorrhea, dyspareunia, pelvic pain, pain with bowel movements, and  stress urinary incontinence.  The patient underwent a laparoscopic-  assisted vaginal hysterectomy by Dr. Ambrose Mantle and Dr. Jackelyn Knife assisting  under general anesthesia.  Pelvic organs were normal except for what  appeared to be a very thick right uterosacral ligament that had weard  tissue wanted surface suggestive of endometriosis.  Because the right  ureter was very close in proximity, I did not do anything to this area  except I tried to stay close to the uterus when I clamped the right  uterosacral ligament.  Dr. Vernie Ammons performed his sling procedure.  Postoperatively, the patient did well.  On her preop labs, her glucose  was 336, so she was started on the Glucommander.  Just prior to surgery,  her blood sugar was 237, intraoperatively it was 202, and in the  recovery room it was less than 200, and remained less than 200 for the  remainder of her hospital stay.  On the first postop day, she was  afebrile.  Her blood pressure was normal.  Her abdomen was soft, but it  was slightly distended and somewhat tender.  She had passed no flatus.  Incisions appeared to be clean and dry and she was felt to be a  candidate for discharge.   Laboratory data showed initial comprehensive metabolic profile normal  except with a glucose of 336, hemoglobin was 15.8, hematocrit 44.8,  platelet count 204,000, 64 segs, 27 lymphs, 8 monos,  and 1 eosinophil.  Urine pregnancy test was negative.  Follow-up hemoglobin on the evening  of surgery was 13.4, hematocrit 39.1, and on the morning of the first  postop day, it was 12.9 in 37.6.  the patient does have a small right  thyroid nodule and we are going to collect thyroid function panel prior  to her discharge.   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   OPERATION:  Laparoscopic-assisted vaginal hysterectomy by Dr. Ambrose Mantle,  sling procedure by Dr. Vernie Ammons.   FINAL CONDITION:  Improved.   INSTRUCTIONS:  Our regular discharge instructions.  No vaginal entrance,  no heavy lifting or strenuous activity.  Call with any fever greater  than 100.4 degrees.  Call with any heavy vaginal bleeding.  The patient  takes Humalog insulin 5-7 units with each meal and at bedtime, continue  that.  Take Paxil 40 mg daily, generic Prilosec daily, and take Advair  inhaler as needed.  Darvocet-N 100 36 tablets one every 3-4 hours as  needed for pain, is given at discharge as well as one refill.  She is to  return in 2 weeks to see me and Dr. Vernie Ammons coming back in 1 week.      Malachi Pro. Ambrose Mantle, M.D.  Electronically Signed    TFH/MEDQ  D:  01/16/2008  T:  01/16/2008  Job:  [id number]

## 2010-11-29 NOTE — Assessment & Plan Note (Signed)
[facility]                         GASTROENTEROLOGY OFFICE NOTE   [name], [name]                     MRN:          [id number]  DATE:10/16/2007                            DOB:          11/28/1975    REFERRING PHYSICIAN:  Malachi Pro. Ambrose Mantle, M.D.   PROBLEM:  Abdominal pain.   [name] is a pleasant, 35 year old white female, referred through  the courtesy of Dr. Ambrose Mantle for evaluation.  She has several GI  complaints.  She has severe pain up into the rectum with defecation.  This has been occurring for years and is only worsening.  It clearly  occurs during her menstrual period.  She has pain when she passes gas.  Pain may last for several minutes following a bowel movement.  There is  no history of hematochezia or melena.  She has a history of  endometriosis.  She does see some blood on the stools.  She tends to go  a couple of days without a bowel movement and then often has very loose,  multiple, foul-smelling stools.  She claims she passes a large amount of  gas, which is particularly foul-smelling.  She complains of frequent  pyrosis, hoarseness and sore throat.  She has taken PPI therapy in the  past with improvement.  She is also complaining of postprandial  abdominal distention with nausea.  She has a history of juvenile onset  diabetes.   PAST MEDICAL HISTORY:  Also pertinent for asthma.   FAMILY HISTORY:  Positive for mother and grandmother with diabetes.   MEDICATIONS:  Include insulin, Paxil and omeprazole p.r.n.   ALLERGIES:  She has no allergies.   SOCIAL HISTORY:  She smokes a pack and a half a day.  She does not  drink.  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   REVIEW OF SYSTEMS:  Positive for night sweats, excessive urination,  fatigue, and leakage of urine.   PHYSICAL EXAMINATION:  Pulse 100, blood pressure 116/70, weight 120.  HEENT: EOMI.  PERRLA.  Sclerae are anicteric.  Conjunctivae are pink.  NECK:   Supple without thyromegaly, adenopathy or carotid bruits.  CHEST:  Clear to auscultation and percussion without adventitious  sounds.  CARDIAC:  Regular rhythm; normal S1 S2.  There are no murmurs, gallops  or rubs.  ABDOMEN:  There is no succussion splash.  Bowel sounds are normoactive.  Abdomen is soft, nontender and nondistended.  There are no abdominal  masses, tenderness, splenic enlargement or hepatomegaly.  EXTREMITIES:  Full range of motion.  No cyanosis, clubbing or edema.  RECTAL:  Deferred.   IMPRESSION:  1. Severe pain with bowel movements.  I am suspicious that this could      be due to endometriosis, since it clearly occurs during the time of      her menstrual period.  Endoluminal involvement is less likely in      the absence of rectal bleeding, though is still a possibility.      Primary colonic lesion, including inflammatory bowel disease, are      less likely considerations.  2. GERD.  3. Nausea  and bloating - possible gastroparesis.  4. Frequent diarrhea with excess gas.  She certainly could have a      bacterial overgrowth, related to a generalized motility disorder,      secondary to her diabetes.   RECOMMENDATION:  1. Colonoscopy.  2. Omeprazole 40 mg a day.  3. Upper endoscopy.  4. To consider gastric emptying scan, pending endoscopy results.  5. Trial of ampicillin 250 mg four times a day for seven days.     Barbette Hair. Arlyce Dice, MD,FACG  Electronically Signed    RDK/MedQ  DD: 10/16/2007  DT: 10/16/2007  Job #: [id number]   cc:   Malachi Pro. Ambrose Mantle, M.D.

## 2010-11-29 NOTE — Op Note (Signed)
[name], [name]            ACCOUNT NO.:  [id number]   MEDICAL RECORD NO.:  [id number]          PATIENT TYPE:  OIB   LOCATION:  9309                          FACILITY:  [facility]   PHYSICIAN:  [name], M.D.  DATE OF BIRTH:  1975/08/27   DATE OF PROCEDURE:  DATE OF DISCHARGE:                               OPERATIVE REPORT   PREOPERATIVE DIAGNOSIS:  Stress urinary incontinence.   POSTOPERATIVE DIAGNOSIS:  Stress urinary incontinence.   PROCEDURE:  Suprapubic sling.   SURGEON:  Alejandra C. Vernie Ammons, MD   ANESTHESIA:  General.   DRAINS:  16-French Foley catheter.   SPECIMENS:  None.   BLOOD LOSS:  Less than 5 mL.   COMPLICATIONS:  None.   INDICATIONS:  The patient is a 35 year old white female with known  stress urinary incontinence.  She is also to undergo hysterectomy and  this was performed by Dr. Ambrose Graham and dictated by him separately.  We  discussed the risks, complications, and alternatives to sling placement.  She understands and elected to proceed.   DESCRIPTION OF OPERATION:  After informed consent, the patient was  brought to the main OR, placed on the table, administered general  anesthesia, and then underwent her laparoscopic hysterectomy by Dr.  Ambrose Graham.  When I entered the room, a repeat official time-out was  performed and with the patient in the dorsal lithotomy position, and the  genitalia previously sterilely prepped and draped.  A weighted vaginal  speculum was inserted as well as the 16-French Foley catheter.  I  injected 0.25% Marcaine with epinephrine in the subvaginal mucosa in the  midline and after allowing adequate time for epinephrine effect, a  midline incision was then made in the anterior vaginal wall over the mid  urethra.  I then dissected laterally beneath the mucosa lateral to the  urethra on each side and then was able to in situate a single digit into  the incision and palpate the urethra, catheter, and undersurface of the  symphysis  pubis.   I then directed my attention to the suprapubic region where stab  incisions were made 4 fingerbreadths apart on each side of the midline  and the bladder was completely drained through the Foley catheter.  I  then passed the sling trocar through the stab incision, down through the  fascia and behind the symphysis pubis and then directed it out at the  mid urethral level by digital palpation.  This was first done on the  left and then right side.   I then removed the Foley catheter and inserted a 19-French cystoscope  sheath with 70-degree lens and fully inspected the bladder.  I noted no  tumor, stones or inflammatory lesions.  The patient was previously given  indigo carmine intravenously and I noted a blue efflux from both  ureteral orifices.  I then inspected the remainder of the bladder and  found no evidence of perforation or foreign body or other abnormality.  I therefore removed the cystoscope and drained the bladder, and  reinserted the Foley catheter.   With the Foley catheter in place, I then positioned the  sling at the mid  urethral level and then placed a forceps under the sling and removed  first the right then left plastic sheathing on the sling material seated  in position and noting no tension whatsoever on the sling material.  I  therefore excised the excess sling material at the skin level and  copiously irrigated the vaginal incision with saline.  I then closed the  vaginal incision with running 2-0 Vicryl suture and closed the skin  incisions with Dermabond.  The Foley catheter was removed and repeat  cystoscopy was performed again noting no evidence of sling material,  perforation or abnormality of the bladder.  The ureteral orifices again  were noted to be effluxing blue urine.  I therefore removed the  cystoscope and reinserted the Foley catheter and the patient was  awakened and taken to recovery room in stable and satisfactory  condition.  She  tolerated the procedure well.  No intraoperative  complications.  She will be observed overnight with Foley catheter  indwelling and then the catheter will be removed in the morning for  voiding trial.      [name], M.D.  Electronically Signed     MCO/MEDQ  D:  01/15/2008  T:  01/16/2008  Job:  [id number]   cc:   [name], M.D.  Fax: [phone]

## 2010-11-29 NOTE — Op Note (Signed)
[name], [name]            ACCOUNT NO.:  [id number]   MEDICAL RECORD NO.:  [id number]          PATIENT TYPE:  OIB   LOCATION:  9309                          [facility]ACILITY:  [facility]   PHYSICIAN:  Malachi Pro. Ambrose Mantle, M.D. DATE OF BIRTH:  05/29/1976   DATE OF PROCEDURE:  01/15/2008  DATE OF DISCHARGE:                               OPERATIVE REPORT   PREOPERATIVE DIAGNOSES:  1. Menorrhagia.  2. Dysmenorrhea.  3. Dyspareunia.  4. Pelvic pain.  5. Pain with bowel movements.   POSTOPERATIVE DIAGNOSES:  1. Menorrhagia.  2. Dysmenorrhea.  3. Dyspareunia.  4. Pelvic pain.  5. Pain with bowel movements.  6. Probable endometriosis of the right uterosacral ligament.   OPERATION:  Laparoscopic-assisted vaginal hysterectomy and sling  procedure, laparoscopic-assisted vaginal hysterectomy done by Dr. Ambrose Mantle  with Dr. Jackelyn Knife assisting and sling done by Dr. Vernie Ammons.   ANESTHESIA:  General.   The patient was brought to the operating room and placed under  satisfactory general anesthesia placed in lithotomy position.  Exam  revealed the uterus to be midplane normal size.  There was possible  thickening of the right uterosacral ligament.  The abdomen, vulva,  vagina, and urethra were prepped with Betadine solution.  The bladder  was emptied with a [place] catheter.  A Hulka cannula was placed into the  uterus and attached to the anterior cervical lip.  The abdomen and  vagina were then draped as a sterile field.  The abdomen seemed to be  distended, so an NG tube was placed and then deflated the stomach and  the mass went away.  The patient's umbilicus was high on the abdomen, so  it was approximately 4 cm above the bifurcation of the aorta, so I  elected to do an open laparoscopy.  I made incision in the inferior  portion of the umbilicus, went down to the fascia, incised the fascia,  and entered the peritoneal cavity, placed a 0 Vicryl suture through the  fascia in a pursestring fashion and  then inserted the Hasson cannula.  I  attached the Vicryl suture to the Hasson cannula, placed to the  accessory trocars laterally in the abdomen under direct vision.  I  inspected the entire abdomen.  The uterus appeared normal in size.  The  cul-de-sac was basically free of disease.  However, the right  uterosacral ligament did have hemorrhagic tissue suggesting  endometriosis.  The right ureter was close to the right uterosacral  ligament.  The remainder of the pelvis including the uterus, tubes, and  ovaries appeared normal.  The upper abdomen looked normal.  The  gallbladder was smooth and cystic.  The liver looked normal.  No upper  abdominal abnormalities were noted.  The upper part of the uterus was  then divided from its attachments to the tube, ovary, and broad ligament  with multiple bites with the harmonic scalpel.  At this point, the  procedure was carried below.  The cervix was grasped with Lahey clamps.  The cervicovaginal junction was injected with dilute solution of Neo-  Synephrine.  A circumferential incision was made around the  cervicovaginal  junction.  The bladder was pushed ahead.  The posterior  cul-de-sac was entered and the uterosacral ligaments were clamped, cut,  and suture ligated and held.  I paid special attention to the right  uterosacral ligament to try to stay as far away from the ureters as  possible.  The cardinal ligaments were then clamped, cut, and suture  ligated.  Additional bites were taken above the cardinal ligaments  bilaterally.  The anterior cul-de-sac was entered.  The only 1 or 2 more  bites were necessary and then the uterus was removed.  The posterior  cuff was sewn with a running locked suture of 0 Vicryl starting at the  left uterosacral ligament, going around the cuff to the right  uterosacral ligament.  The uterosacral ligaments were then sutured  together in the midline, staying medial on the right uterosacral  ligament, and the  sutures that had had held the uterosacral ligaments  were then tied together in the midline.  A pursestring suture of 0  Vicryl was used to connect the anterior peritoneum with the uterosacral  ligaments bilaterally.  This was tied down.  The vaginal cuff was then  sutured with interrupted figure-of-eight sutures of 0 Vicryl.  At this  point, I went back to the laparoscope and looked down in the pelvis.  There was a small amount of blood present.  I liberally irrigated with  the Nezhat cannula and aspirated all the blood and the fluid.  There was  no active bleeding present.  Both tubes and ovaries appeared normal and  both accessory trocars were removed and their entry sites were inspected  for any bleeding.  There was no bleeding.  The pneumoperitoneum was  released.  I then pulled the pursestring suture through the fascia of  the umbilicus down, tied it off, and then closed the skin at the  umbilicus with interrupted 3-0 plain catgut suture and put Steri-Strips  across both accessory trocar sites.  At this point, Dr. Vernie Ammons began  his surgery and during his surgery he gave the methylene blue  intravenously and noted good efflux of blue-stained urine through both  ureters.  At the conclusion of his procedure, the patient will be  returned to the recovery room.  Blood loss was about 250 mL.  Sponge and  needle counts were correct.      Malachi Pro. Ambrose Mantle, M.D.  Electronically Signed     TFH/MEDQ  D:  01/15/2008  T:  01/16/2008  Job:  [id number]

## 2010-12-02 NOTE — Assessment & Plan Note (Signed)
[facility]                                 ON-CALL NOTE   [name], [name]                     MRN:          [id number]  DATE:09/05/2006                            DOB:          08/07/1975    Dr. George Hugh patient.  Phone number is [phone].  The patient states  she has had some problem with her arm for a while.  However, this  morning when she woke up her hand was painful and it has progressed over  the day.  She also noted a lump over what sounds like the dorsum of her  hand that was small and hard, somewhat tender, but no significant  redness or weakness.  However, she feels like her whole hand is cooler  than the other hand but is not purplish nor dark color.  She has no  history of trauma, she states she has been having problems with her arm  with movement for at least this week when she moves it a certain way.  She is diabetic and she uses tobacco.  I recommended she be seen in the  office tomorrow.  However, if the pain is increasing or unusual colors  of the hand then she can go to the emergency room tonight.     Neta Mends. Panosh, MD  Electronically Signed    WKP/MedQ  DD: 09/05/2006  DT: 09/06/2006  Job #: [id number]

## 2010-12-02 NOTE — Discharge Summary (Signed)
[facility]  Patient:    [name], [name] Visit Number: [id number] MRN: [id number]          Service Type: ANT Location: [facility] Attending Physician:  Malon Kindle Dictated by:   Malachi Pro. Ambrose Mantle, M.D. Admit Date:  10/18/2001 Discharge Date: 11/05/2001                             Discharge Summary  HISTORY OF PRESENT ILLNESS:   This is a 35 year old white female, para 1-0-0-1, gravida 2, 37+ weeks by 9-week ultrasound with [facility] Nov 24, 2001 who presented complaining of contractions, no vaginal bleeding or rupture of membranes, good fetal movement. In the Maternity Admission Unit, the patients cervix changed from 2 to 3 cm.  PRENATAL LABORATORY DATA:     Blood group and type O positive with a negative antibody, nonreactive serology, rubella immune, hepatitis B surface antigen negative, HIV declined, GC and Chlamydia negative. Triple screen:  Increased maternal serum alpha-fetoprotein. Group B strep negative. Patient has class C diabetes with fair control, per her report, on insulin. She often did not bring her records. She had normal fetal echocardiogram. She had a history of HSV. She was on Valtrex for suppression. She had no current lesions or symptoms. She took a Z-Pak at 26 weeks with an upper respiratory infection, Zantac for reflux.  OBSTETRIC HISTORY:            In February of 1999, she had a spontaneous vaginal delivery at 38 weeks, a 5-pound 3-ounce infant.  GYNECOLOGIC HISTORY:          Chlamydia and HSV. History of VIN-3.  PAST MEDICAL HISTORY:         Class C diabetes mellitus, mild asthma.  PAST SURGICAL HISTORY:        Negative.  ALLERGIES:                    No known drug allergies.  MEDICATIONS:                  1. Novolin insulin 70/30 8 to 10 units every                                  morning.                               2. Zantac p.r.n.                               3. Valtrex 500 mg every day.  SOCIAL  HISTORY:               Married. Tobacco less than one pack a day.  FAMILY HISTORY:               Negative.  PHYSICAL EXAMINATION ON ADMISSION:  VITAL SIGNS:                  Normal vital signs. Fetal heart tones were reactive with contractions every two to five minutes.  ABDOMEN:                      Gravid, nontender. Fundal height 32 cm. Estimated fetal weight about 6 pounds.  PELVIC:  Cervix 3 cm, 80%, vertex, at -2.  HOSPITAL COURSE:              The patient gradually changed her cervix to 4 cm, 80%. The patient then progressed to complete dilatation. She had spontaneous rupture of membranes at 10:47 p.m. She had received an epidural and then she pushed well. She had a spontaneous vaginal delivery of a 5-pound 13-ounce female infant with Apgars of 8 at one and 9 at five minutes by Dr. Jackelyn Knife. A loose nuchal cord x 1 was reduced. The baby was delivered over second degree midline episiotomy. There was a question of light meconium. DeLee suction was clear. Bulb was also used. The spontaneous and intact. Second degree midline episiotomy repaired with 3-0 Vicryl. Blood loss was less than 500 cc. She never required insulin.  Postpartum, the patient did well. She was placed on 4 units of Novolin 70/30 insulin every morning. Her blood sugars were checked regularly while she was in the hospital. Blood sugars ranged between 70 and 225.  On the second postpartum day, she was afebrile, ambulating well, her blood pressure was normal and she was discharged. Hemoglobin 14.1, hematocrit 40.2, white count 12,700, and platelet count 173,000. Followup  hemoglobin 11.9, hematocrit 34.3, white count 8700, platelet count 143,000. RPR was nonreactive.  The patient did request Paxil and she was given Paxil 10 mg by mouth daily.  FINAL DIAGNOSES:              1. Intrauterine pregnancy at 37+ weeks,                                  delivered vertex.                                2. Class C diabetes mellitus.                               3. Depression.  OPERATIONS:                   1. Spontaneous delivery.                               2. Second degree midline episiotomy repair.  FINAL CONDITION:              Improved.  DISCHARGE INSTRUCTIONS:       Instructions include our regular discharge instruction booklet.  DISCHARGE MEDICATIONS:        1. Motrin 600 mg, 20 tablets, one every six                                  hours as needed for pain, with one refill.                               2. Paxil 10 mg, #30, one every day with                                  one refill.  3. Insulin:  The patient is also to use her                                  Novolin 70/30 insulin as she feels she needs                                  it, 4 to 10 units every morning and then                                  Humalog insulin two hours after each meal                                  if the blood sugar is greater than 150.                                  Between 150-190: She should use 1 unit                                                   of Humalog,                                  between 190-230: 2 units of Humalog and                                  between 230-270: 3 units.  DISCHARGE FOLLOWUP:           The patient is to return in six weeks for followup examination. Dictated by:   Malachi Pro. Ambrose Mantle, M.D. Attending Physician:  Malon Kindle DD:  11/10/01 TD:  11/10/01 Job: [id number]

## 2011-01-04 ENCOUNTER — Ambulatory Visit (INDEPENDENT_AMBULATORY_CARE_PROVIDER_SITE_OTHER): Payer: Medicaid Other | Admitting: Endocrinology

## 2011-01-04 ENCOUNTER — Encounter: Payer: Self-pay | Admitting: Endocrinology

## 2011-01-04 VITALS — BP 118/70 | HR 105 | Temp 98.2°F | Ht 64.0 in | Wt 127.1 lb

## 2011-01-04 DIAGNOSIS — M791 Myalgia, unspecified site: Secondary | ICD-10-CM

## 2011-01-04 DIAGNOSIS — IMO0001 Reserved for inherently not codable concepts without codable children: Secondary | ICD-10-CM

## 2011-01-04 MED ORDER — ROPINIROLE HCL 0.5 MG PO TABS: 1.5000 mg | ORAL_TABLET | Freq: Every day | ORAL | Status: DC

## 2011-01-04 MED ORDER — INSULIN ASPART 100 UNIT/ML ~~LOC~~ SOLN: 14.0000 [IU] | Freq: Three times a day (TID) | SUBCUTANEOUS | Status: DC

## 2011-01-04 MED ORDER — ACCU-CHEK AVIVA PLUS W/DEVICE KIT: 1.0000 | PACK | Freq: Once | Status: DC

## 2011-01-04 MED ORDER — GLUCOSE BLOOD VI STRP: 1.0000 | ORAL_STRIP | Freq: Four times a day (QID) | Status: DC

## 2011-01-04 MED ORDER — TRIAMCINOLONE ACETONIDE 0.1 % EX CREA: TOPICAL_CREAM | Freq: Three times a day (TID) | CUTANEOUS | Status: AC | PRN

## 2011-01-04 NOTE — Patient Instructions (Addendum)
Increase ropinirole to 3 x 0.5 mg daily. Refer to a rheumatologist.  you will be called with a day and time for an appointment. Let's also check a test at a neurologist's office of the muscles and nerve endings.  you will be called with a day and time for an appointment. Please make a follow-up appointment in 1 month good diet and exercise habits significanly improve the control of your diabetes.  please let me know if you wish to be referred to a dietician.  high blood sugar is very risky to your health.  you should see an eye doctor every year. controlling your blood pressure and cholesterol drastically reduces the damage diabetes does to your body.  this also applies to quitting smoking.  please discuss these with your doctor.  you should take an aspirin every day, unless you have been advised by a doctor not to. blood tests are being ordered for you today.  please call [phone] to hear your test results.  You will be prompted to enter the 9-digit "MRN" number that appears at the top left of this page, followed by #.  Then you will hear the message. check your blood sugar 4 times a day--before the 3 meals, and at bedtime.  also check if you have symptoms of your blood sugar being too high or too low.  please keep a record of the readings and bring it to your next appointment here.  please call us sooner if you are having low blood sugar episodes. i have sent a prescription to your pharmacy, for a new meter, for strips, and for a skin cream to help the itching on the feet.

## 2011-01-04 NOTE — Progress Notes (Signed)
Subjective:    Patient ID: [name], female    DOB: [birth year], 35 y.o.   MRN: [id number]  HPI Leg pains (present x a few mos).are improved to a mild to moderate level), with the increase of her ropinirole, but not resolved.  It is not necessarily worse at some time of day.  She says it is the same at the thighs as in the knees.  No assoc numbness. no cbg record, but states cbg's are "in the 200's."  She says there is no trend throughout the day.  She says she takes humalog approx 14 units 4x a day (qac).  She says she seldom checks cbg's hyponatremia was noted late in 2011. She uses the advair as rx'ed, and seldom takes the albuterol. Past Medical History  Diagnosis Date  . DIABETES MELLITUS, TYPE I 02/02/2007  . Anxiety state, unspecified 03/26/2009  . DEPRESSION 02/16/2009  . ASTHMA 02/02/2007  . Shortness of breath 09/25/2007  . GERD 12/21/2008  . Endometriosis     Past Surgical History  Procedure Date  . Abdominal hysterectomy 2009    BSO    History   Social History  . Marital Status: Legally Separated    Spouse Name: N/A    Number of Children: 2  . Years of Education: N/A   Occupational History  .     Social History Main Topics  . Smoking status: Current Everyday Smoker -- 2.0 packs/day for 20 years  . Smokeless tobacco: Not on file  . Alcohol Use: No  . Drug Use: No  . Sexually Active: Not on file   Other Topics Concern  . Not on file   Social History Narrative   2 children--pt has custody. Husband pays child support.Daily Caffeine Use-3 2 liters a dayPt does not get regular exercise.    Current Outpatient Prescriptions on File Prior to Visit  Medication Sig Dispense Refill  . albuterol (PROVENTIL HFA) 108 (90 BASE) MCG/ACT inhaler Inhale 2 puffs into the lungs every 6 (six) hours as needed.        . doxycycline (DORYX) 100 MG EC tablet Take 100 mg by mouth 2 (two) times daily.        . Fluticasone-Salmeterol (ADVAIR DISKUS) 100-50 MCG/DOSE AEPB Inhale  1 puff into the lungs every 12 (twelve) hours.        . Insulin Pen Needle 31G X 8 MM MISC Use as directed       . omeprazole (PRILOSEC) 20 MG capsule Take 20 mg by mouth daily.        Marland Kitchen PARoxetine (PAXIL) 40 MG tablet Take 40 mg by mouth every morning.        Marland Kitchen DISCONTD: glucose blood (PRODIGY TEST) test strip 6/day, variable glucoses. Dx 250.01       . DISCONTD: NOVOLOG FLEXPEN 100 UNIT/ML injection INJECT 10 UNITS SUBCUTANEOUSLY THREE TIMES DAILY  15 mL  10  . DISCONTD: rOPINIRole (REQUIP) 1 MG tablet Take 1 tablet (1 mg total) by mouth at bedtime.  30 tablet  11  . fluconazole (DIFLUCAN) 150 MG tablet Take 150 mg by mouth once.        . nicotine (NICODERM CQ - DOSED IN MG/24 HOURS) 21 mg/24hr patch Place 1 patch onto the skin daily.          Allergies  Allergen Reactions  . Codeine     REACTION: NAUSEA    Family History  Problem Relation Age of Onset  . Diabetes Mother   .  Diabetes Maternal Aunt   . Emphysema Maternal Aunt   . Emphysema Maternal Uncle   . Diabetes Maternal Grandfather   . Cancer Paternal Grandmother     Lung Cancer    BP 118/70  Pulse 105  Temp(Src) 98.2 F (36.8 C) (Oral)  Ht 5\' 4"  (1.626 m)  Wt 127 lb 1.9 oz (57.661 kg)  BMI 21.82 kg/m2  SpO2 92%   Review of Systems  Constitutional:       Pt reports weight gain  HENT: Negative for hearing loss.   Eyes: Negative for visual disturbance.  Respiratory:       She attributes cough to smoking  Cardiovascular: Negative for chest pain.  Gastrointestinal: Negative for anal bleeding.  Genitourinary: Negative for hematuria.  Musculoskeletal: Positive for back pain.  Skin: Negative for rash.  Neurological: Negative for syncope.  Hematological: Does not bruise/bleed easily.  Psychiatric/Behavioral:       No change in chronic depression      Objective:   Physical Exam GENERAL: no distress Msk: no muscle tenderness Ext: knees are normal to my exam PSYCH: Alert and oriented x 3.  Does not appear  anxious nor depressed. Pulses: dorsalis pedis intact bilat.   Feet: no deformity.  no ulcer on the feet.  feet are of normal color and temp.  no edema.  There are a few eczematous areas on the feet (each approx 1 cm). Neuro: sensation is intact to touch on the feet    Assessment & Plan:  Leg pain, uncertain etiology--?neuropathic Asthma, well-controlled Hyponatremia, uncertain if this contributes to sxs Dm, therapy limited by noncompliance with cbg's.  Noncompliance is risky to her health.  i sent rxs for a new meter, and for strips

## 2011-01-16 ENCOUNTER — Other Ambulatory Visit: Payer: Self-pay | Admitting: Endocrinology

## 2011-02-18 ENCOUNTER — Other Ambulatory Visit: Payer: Self-pay | Admitting: Endocrinology

## 2011-03-21 ENCOUNTER — Telehealth: Payer: Self-pay | Admitting: *Deleted

## 2011-03-21 NOTE — Telephone Encounter (Signed)
Per MD, left message on phone tree regarding nerve conduction study and EMG done at [facility]-(normal)-copy of report sent to scanning

## 2011-04-13 LAB — T3: T3, Total: 173.9 (ref 80.0–204.0)

## 2011-04-13 LAB — COMPREHENSIVE METABOLIC PANEL
ALT: 17
AST: 17
Alkaline Phosphatase: 93
CO2: 27
Chloride: 100
GFR calc Af Amer: >60
GFR calc non Af Amer: >60
Glucose, Bld: 336 — ABNORMAL HIGH
Potassium: 3.8
Sodium: 135
Total Bilirubin: 0.8

## 2011-04-13 LAB — DIFFERENTIAL
Eosinophils Relative: 1
Lymphocytes Relative: 27
Monocytes Absolute: 0.4
Monocytes Relative: 8
Neutro Abs: 3.6

## 2011-04-13 LAB — TSH: TSH: 1.034 (ref 0.350–4.500)

## 2011-04-13 LAB — CBC
Hemoglobin: 15.8 — ABNORMAL HIGH
Hemoglobin: 13.4
MCHC: 35.3
MCHC: 34.2
MCHC: 34.4
MCV: 93.1
MCV: 93.2
Platelets: 173
RBC: 4.87
RBC: 4.2
RDW: 12.5
RDW: 12.9
WBC: 5.6

## 2011-05-01 DIAGNOSIS — F419 Anxiety disorder, unspecified: Secondary | ICD-10-CM | POA: Insufficient documentation

## 2011-05-02 ENCOUNTER — Telehealth: Payer: Self-pay | Admitting: Gastroenterology

## 2011-05-02 NOTE — Telephone Encounter (Signed)
Pt states that she has been having a lot of problems with diarrhea. Requesting to be seen sooner that first available. Pt scheduled to see Amy Esterwood PA 05/05/11@1 :30pm. Pt aware of appt date and time.

## 2011-05-05 ENCOUNTER — Encounter: Payer: Self-pay | Admitting: Physician Assistant

## 2011-05-05 ENCOUNTER — Other Ambulatory Visit: Payer: Self-pay | Admitting: *Deleted

## 2011-05-05 ENCOUNTER — Other Ambulatory Visit (INDEPENDENT_AMBULATORY_CARE_PROVIDER_SITE_OTHER): Payer: Medicaid Other

## 2011-05-05 ENCOUNTER — Ambulatory Visit (INDEPENDENT_AMBULATORY_CARE_PROVIDER_SITE_OTHER): Payer: Medicaid Other | Admitting: Physician Assistant

## 2011-05-05 VITALS — BP 122/64 | HR 100 | Ht 64.0 in | Wt 127.8 lb

## 2011-05-05 DIAGNOSIS — R109 Unspecified abdominal pain: Secondary | ICD-10-CM

## 2011-05-05 LAB — SEDIMENTATION RATE: Sed Rate: 21 mm/hr (ref 0–22)

## 2011-05-05 LAB — CBC WITH DIFFERENTIAL/PLATELET
Basophils Absolute: 0.1 10*3/uL (ref 0.0–0.1)
Basophils Relative: 0.7 % (ref 0.0–3.0)
Eosinophils Absolute: 0 10*3/uL (ref 0.0–0.7)
MCHC: 33.9 g/dL (ref 30.0–36.0)
MCV: 92 fl (ref 78.0–100.0)
Monocytes Absolute: 0.5 10*3/uL (ref 0.1–1.0)
Neutrophils Relative %: 66.7 % (ref 43.0–77.0)
Platelets: 204 10*3/uL (ref 150.0–400.0)
RDW: 12.6 % (ref 11.5–14.6)

## 2011-05-05 LAB — HIGH SENSITIVITY CRP: CRP, High Sensitivity: 0.48 mg/L (ref 0.000–5.000)

## 2011-05-05 MED ORDER — GLYCOPYRROLATE 2 MG PO TABS: ORAL_TABLET | ORAL | Status: DC

## 2011-05-05 MED ORDER — METRONIDAZOLE 250 MG PO TABS: 250.0000 mg | ORAL_TABLET | Freq: Three times a day (TID) | ORAL | Status: AC

## 2011-05-05 NOTE — Patient Instructions (Addendum)
Please go to the basement level to have your labs drawn.  Scheduled CT, directions and  contrast provided. Sent 2 prescriptions to pharmacy Follow up with Dr. Arlyce Dice in 3 weeks

## 2011-05-05 NOTE — Progress Notes (Signed)
Subjective:    Patient ID: [name], female    DOB: 10/06/1975, 35 y.o.   MRN: [id number]  HPI 35 year old white female type I diabetic, known to Dr. Arlyce Dice from prior evaluation in 2009. She had colonoscopy at that time which was normal with the exception of internal hemorrhoids and an upper endoscopy which showed some mild distal esophagitis. She also has history of depression GERD endometriosis for which she is status post total abdominal hysterectomy 3 years ago and also has asthma. She is a smoker.  Patient comes in today with complaint of ongoing problems with diarrhea. She relates that she has had problems with her bowels ever center daughter was born about 13 years ago. She says she has had chronic lower abdominal pain since that time as well. She underwent hysterectomy 3 years ago because of pain which was not relieved. She complains of lower abdominal pain just prior to a bowel movement as well as some internal rectal pain at times prior to a bowel movement. On further questioning her diarrhea is actually a mushy-type of stool and in not loose or liquid stools. She says most days she goes after meals and at times has some urgency. She says she has lower abdominal pain periodically through the month. She is also concerned with gas and abdominal bloating and says that her gasses extremely foul. She has not noted any melena or hematochezia. Her appetite is fair her weight is stable he does not have any known food intolerances.  She is currently on a course of Augmentin for strep throat and an ear infection and says that she has had multiple infections and courses of antibiotics over the past couple of years. She also complains of dyspareunia.  Family history is pertinent for an uncle with Crohn's disease    Review of Systems  Constitutional: Negative.   HENT: Positive for ear pain and sore throat.   Eyes: Negative.   Respiratory: Negative.   Cardiovascular: Negative.     Gastrointestinal: Positive for abdominal pain and diarrhea.  Genitourinary: Negative.   Musculoskeletal: Negative.   Skin: Negative.   Neurological: Negative.   Hematological: Negative.   Psychiatric/Behavioral: Negative.    Outpatient Prescriptions Prior to Visit  Medication Sig Dispense Refill  . albuterol (PROVENTIL HFA) 108 (90 BASE) MCG/ACT inhaler Inhale 2 puffs into the lungs every 6 (six) hours as needed.        . Blood Glucose Monitoring Suppl (ACCU-CHEK AVIVA PLUS) W/DEVICE KIT 1 Device by Does not apply route once.  1 kit  0  . fluconazole (DIFLUCAN) 150 MG tablet Take 150 mg by mouth once.        . Fluticasone-Salmeterol (ADVAIR DISKUS) 100-50 MCG/DOSE AEPB Inhale 1 puff into the lungs every 12 (twelve) hours.        Marland Kitchen glucose blood (ACCU-CHEK AVIVA PLUS) test strip 1 each by Other route 4 (four) times daily. And lancets 250.01.  (variable glucoses).  100 each  12  . insulin aspart (NOVOLOG FLEXPEN) 100 UNIT/ML injection Inject 14 Units into the skin 3 (three) times daily before meals.  15 mL  12  . Insulin Pen Needle 31G X 8 MM MISC Use as directed       . omeprazole (PRILOSEC) 20 MG capsule TAKE ONE CAPSULE BY MOUTH EVERY DAY  30 capsule  4  . PARoxetine (PAXIL) 40 MG tablet TAKE ONE TABLET BY MOUTH EVERY DAY  30 tablet  4  . rOPINIRole (REQUIP) 0.5 MG tablet Take  3 tablets (1.5 mg total) by mouth at bedtime.  90 tablet  3  . triamcinolone (KENALOG) 0.1 % cream Apply topically 3 (three) times daily as needed. For itching  30 g  2  . doxycycline (DORYX) 100 MG EC tablet Take 100 mg by mouth 2 (two) times daily.        . nicotine (NICODERM CQ - DOSED IN MG/24 HOURS) 21 mg/24hr patch Place 1 patch onto the skin daily.             Allergies  Allergen Reactions  . Codeine     REACTION: NAUSEA     Objective:   Physical Exam Well-developed thin white female in no acute distress blood pressure; 122/64, pulse 100, HEENT; nontraumatic, normocephalic, EOMI, PERRLA, sclera  anicteric,Neck; Supple no JVD Cardiovascular; slightly tachycardia regular rhythm with S1-S2 no murmur rub or gallop, Pulmonary; clear bilaterally, Abdomen; soft and is mildly tender bilaterally in the lower quadrants somewhat more tender in the right lower quadrant no guarding, no rebound, no palpable mass or hepatosplenomegaly, Rectal; not done, Extremities; no clubbing cyanosis or edema skin warm and dry, Psych; mood and affect normal and appropriate        Assessment & Plan:  #30 35 year old female, type I diabetic with chronic complaints of lower abdominal pain, tenesmus and diarrhea" with chronic mushy-type stools and malodorous stools. Rule out IBS, rule out small bowel bacterial overgrowth in the setting of diabetes, rule out a component of pancreatic insufficiency, rule out IBD though no evidence on prior colonoscopy, rule out celiac disease.  Plan; CBC, CRP, sedimentation rate and celiac panel today Scheduled for CT abdomen and pelvis Discussed addition of probiotics which she says she has used in the past without any benefit Trial of Robinul Forte 2 mg once or twice daily for cramping and tenesmus We'll give an empiric course of Flagyl 253 times daily x10 days after she completes her current course of Augmentin to cover the possibility of bacterial overgrowth Followup with Dr. Arlyce Dice or myself in 2-3 weeks.

## 2011-05-08 ENCOUNTER — Ambulatory Visit (INDEPENDENT_AMBULATORY_CARE_PROVIDER_SITE_OTHER)
Admission: RE | Admit: 2011-05-08 | Discharge: 2011-05-08 | Disposition: A | Discharge status: Home / Self Care | Payer: Medicaid Other | Source: Ambulatory Visit | Attending: Physician Assistant | Admitting: Physician Assistant

## 2011-05-08 ENCOUNTER — Other Ambulatory Visit (INDEPENDENT_AMBULATORY_CARE_PROVIDER_SITE_OTHER): Payer: Medicaid Other

## 2011-05-08 ENCOUNTER — Other Ambulatory Visit: Payer: Self-pay | Admitting: *Deleted

## 2011-05-08 DIAGNOSIS — R109 Unspecified abdominal pain: Secondary | ICD-10-CM

## 2011-05-08 LAB — BASIC METABOLIC PANEL
BUN: 11 mg/dL (ref 6–23)
CO2: 25 mEq/L (ref 19–32)
Chloride: 97 mEq/L (ref 96–112)
Potassium: 4.6 mEq/L (ref 3.5–5.1)

## 2011-05-08 MED ORDER — IOHEXOL 300 MG/ML  SOLN
80.0000 mL | Freq: Once | INTRAMUSCULAR | Status: AC | PRN
  Administered 2011-05-08: 80 mL via INTRAVENOUS

## 2011-05-09 ENCOUNTER — Telehealth: Payer: Self-pay | Admitting: Gastroenterology

## 2011-05-09 LAB — RETICULIN ANTIBODIES, IGA W TITER: Reticulin Ab, IgA: NEGATIVE

## 2011-05-09 NOTE — Telephone Encounter (Signed)
Pt calling for lab and CT results. Please advise.

## 2011-05-09 NOTE — Progress Notes (Signed)
Agree with Ms. Esterwood's assessment and plan. Treniece Holsclaw E. Jihaad Bruschi, MD, FACG   

## 2011-05-10 NOTE — Telephone Encounter (Signed)
Results all unremarkable. Needs f/u OV

## 2011-05-10 NOTE — Telephone Encounter (Signed)
Pt aware and has an OV scheduled for 06/06/11@10 :15am.

## 2011-05-11 ENCOUNTER — Telehealth: Payer: Self-pay

## 2011-05-11 NOTE — Telephone Encounter (Signed)
Message copied by Michele Mcalpine on Thu May 11, 2011  4:20 PM ------      Message from: [place], [place] S      Created: Thu May 11, 2011  3:57 PM       Please let pt know all of the labs are normal, ct scan shows a gallstone, but otherwise negative. I do not think the gallstone is causing all of her pain. She should hav an appt with her gyn coming up, and we are working on pain clinic referral

## 2011-05-11 NOTE — Telephone Encounter (Signed)
Pt aware.

## 2011-05-31 ENCOUNTER — Other Ambulatory Visit: Payer: Self-pay | Admitting: Endocrinology

## 2011-06-06 ENCOUNTER — Ambulatory Visit: Payer: Medicaid Other | Admitting: Gastroenterology

## 2011-07-01 ENCOUNTER — Other Ambulatory Visit: Payer: Self-pay | Admitting: Endocrinology

## 2011-07-03 MED ORDER — PAROXETINE HCL 40 MG PO TABS: ORAL_TABLET | ORAL | Status: DC

## 2011-07-28 ENCOUNTER — Telehealth: Payer: Self-pay | Admitting: Gastroenterology

## 2011-07-28 DIAGNOSIS — R197 Diarrhea, unspecified: Secondary | ICD-10-CM

## 2011-07-28 NOTE — Telephone Encounter (Signed)
[name] pt calling, last seen 05/05/11 by Mike Gip PA. Pt states she is having the same symptoms she had then. Pt c/o lots of gas, bloating, and diarrhea. States when she eats anything she had diarrhea. She has also had some nausea. This has been going on for about 2 weeks. Pt is requesting an antibiotic. In October she received an empiric course of Flagyl 250mg  TID for 10days. Pt states she does not have the money to come in to be seen. Dr. Christella Hartigan as doc of the day please advise.

## 2011-07-28 NOTE — Telephone Encounter (Signed)
Spoke with pt and she is aware. States if she does not come today she will come Monday to the lab.

## 2011-07-28 NOTE — Telephone Encounter (Signed)
Needs stool studies for c. Diff, culture, O and P, fecal leukocytes.  It is ok for her to take imodium 1 pill shortly after she wakes every morning for now.

## 2011-08-02 ENCOUNTER — Ambulatory Visit: Payer: Medicaid Other

## 2011-08-02 DIAGNOSIS — R197 Diarrhea, unspecified: Secondary | ICD-10-CM

## 2011-08-03 LAB — OVA AND PARASITE SCREEN: OP: NONE SEEN

## 2011-08-06 LAB — STOOL CULTURE

## 2011-08-07 NOTE — Progress Notes (Signed)
Awaiting c. Diff results.

## 2011-08-08 ENCOUNTER — Encounter: Payer: Self-pay | Admitting: Gastroenterology

## 2011-08-08 LAB — CLOSTRIDIUM DIFFICILE BY PCR: Toxigenic C. Difficile by PCR: NOT DETECTED

## 2011-09-04 ENCOUNTER — Other Ambulatory Visit: Payer: Self-pay | Admitting: Endocrinology

## 2011-09-11 DIAGNOSIS — H7190 Unspecified cholesteatoma, unspecified ear: Secondary | ICD-10-CM

## 2011-09-11 DIAGNOSIS — Z9109 Other allergy status, other than to drugs and biological substances: Secondary | ICD-10-CM | POA: Insufficient documentation

## 2011-09-11 HISTORY — DX: Unspecified cholesteatoma, unspecified ear: H71.90

## 2011-09-21 ENCOUNTER — Telehealth: Payer: Self-pay | Admitting: Gastroenterology

## 2011-09-21 NOTE — Telephone Encounter (Signed)
Pt states she had a stool test a while back and there were wbc's present in the stool. Pt states she has been having abdominal discomfort. Pt scheduled to see Dr. Arlyce Dice tomorrow at 2:15pm. Pt aware of appt date and time.

## 2011-09-22 ENCOUNTER — Ambulatory Visit (INDEPENDENT_AMBULATORY_CARE_PROVIDER_SITE_OTHER): Payer: Medicaid Other | Admitting: Gastroenterology

## 2011-09-22 ENCOUNTER — Encounter: Payer: Self-pay | Admitting: Gastroenterology

## 2011-09-22 VITALS — BP 108/60 | HR 100 | Ht 64.0 in | Wt 130.8 lb

## 2011-09-22 DIAGNOSIS — R197 Diarrhea, unspecified: Secondary | ICD-10-CM

## 2011-09-22 MED ORDER — METRONIDAZOLE 250 MG PO TABS: 250.0000 mg | ORAL_TABLET | Freq: Three times a day (TID) | ORAL | Status: AC

## 2011-09-22 NOTE — Progress Notes (Signed)
History of Present Illness:  [name] has returned for followup of abdominal pain and diarrhea.  When she received a course of Flagyl several months ago her pain and diarrhea subsided. This improvement lasted for approximately a week after discontinuing the medication. Symptoms then recurred. She continues to complain of frequent diarrhea, foul smelling flatus and lower abdominal aching pain. Stool studies were negative except for a positive lactoferrin.    Review of Systems: Pertinent positive and negative review of systems were noted in the above HPI section. All other review of systems were otherwise negative.    Current Medications, Allergies, Past Medical History, Past Surgical History, Family History and Social History were reviewed in [employer] electronic medical record  Vital signs were reviewed in today's medical record. Physical Exam: General: Well developed , well nourished, no acute distress

## 2011-09-22 NOTE — Patient Instructions (Addendum)
You have been given a separate informational sheet regarding your tobacco use, the importance of quitting and local resources to help you quit.  We are sending in your prescription to your pharmacy If pain or diarrhea develops please contact the office Follow up in 2 months

## 2011-09-22 NOTE — Assessment & Plan Note (Signed)
Diarrhea and abdominal pain are probably secondary to bacterial overgrowth related to dysmotility.  Recommendations #1 restart Flagyl to be taken 7 days consecutively every month

## 2011-09-25 ENCOUNTER — Emergency Department: Payer: Self-pay | Admitting: Unknown Physician Specialty

## 2011-09-25 LAB — TROPONIN I: Troponin-I: <0.02 ng/mL

## 2011-09-25 LAB — COMPREHENSIVE METABOLIC PANEL
Albumin: 4.2 g/dL (ref 3.4–5.0)
Alkaline Phosphatase: 93 U/L (ref 50–136)
BUN: 6 mg/dL — ABNORMAL LOW (ref 7–18)
Bilirubin,Total: 0.2 mg/dL (ref 0.2–1.0)
Calcium, Total: 8.7 mg/dL (ref 8.5–10.1)
Creatinine: 0.56 mg/dL — ABNORMAL LOW (ref 0.60–1.30)
EGFR (Non-African Amer.): >60
Glucose: 229 mg/dL — ABNORMAL HIGH (ref 65–99)
Osmolality: 262 (ref 275–301)
Potassium: 3.8 mmol/L (ref 3.5–5.1)
SGOT(AST): 18 U/L (ref 15–37)
Sodium: 128 mmol/L — ABNORMAL LOW (ref 136–145)
Total Protein: 7.2 g/dL (ref 6.4–8.2)

## 2011-09-25 LAB — CBC
HGB: 13.5 g/dL (ref 12.0–16.0)
MCHC: 34.1 g/dL (ref 32.0–36.0)
MCV: 91 fL (ref 80–100)
RBC: 4.36 10*6/uL (ref 3.80–5.20)
WBC: 7.7 10*3/uL (ref 3.6–11.0)

## 2011-09-27 ENCOUNTER — Telehealth: Payer: Self-pay

## 2011-09-27 NOTE — Telephone Encounter (Signed)
This is a rare side-effect, adn you are at low risk.  i would take if i was you.

## 2011-09-27 NOTE — Telephone Encounter (Signed)
Pt called stating she was prescribed Azithromycin at the ER recently for an URI. Pt says that she has heard that this medicine causes heart attack and is requesting MD prescribe an alternative, please advise.

## 2011-09-27 NOTE — Telephone Encounter (Signed)
Pt informed of MD's advisement. 

## 2011-10-03 ENCOUNTER — Other Ambulatory Visit: Payer: Self-pay

## 2011-10-03 ENCOUNTER — Emergency Department (HOSPITAL_COMMUNITY)
Admission: EM | Admit: 2011-10-03 | Discharge: 2011-10-03 | Discharge status: Against Medical Advice | Payer: Medicaid Other | Attending: Emergency Medicine | Admitting: Emergency Medicine

## 2011-10-03 ENCOUNTER — Encounter (HOSPITAL_COMMUNITY): Payer: Self-pay | Admitting: Emergency Medicine

## 2011-10-03 DIAGNOSIS — R079 Chest pain, unspecified: Secondary | ICD-10-CM | POA: Insufficient documentation

## 2011-10-03 NOTE — ED Notes (Signed)
Patient states that she has had chest pain x 1 month. She was recently worked up for the same Chest pain less than one week ago and found to have pleuralcy - the patient states that she is no longer on antibiotics. Pain is worse

## 2011-10-04 ENCOUNTER — Other Ambulatory Visit: Payer: Self-pay | Admitting: Endocrinology

## 2011-10-04 DIAGNOSIS — Z8709 Personal history of other diseases of the respiratory system: Secondary | ICD-10-CM | POA: Insufficient documentation

## 2011-11-06 ENCOUNTER — Other Ambulatory Visit: Payer: Self-pay | Admitting: Endocrinology

## 2011-12-08 ENCOUNTER — Other Ambulatory Visit: Payer: Self-pay | Admitting: Endocrinology

## 2012-01-01 DIAGNOSIS — K5900 Constipation, unspecified: Secondary | ICD-10-CM | POA: Insufficient documentation

## 2012-02-08 ENCOUNTER — Other Ambulatory Visit: Payer: Self-pay | Admitting: Endocrinology

## 2012-02-15 DIAGNOSIS — J449 Chronic obstructive pulmonary disease, unspecified: Secondary | ICD-10-CM | POA: Insufficient documentation

## 2012-02-15 DIAGNOSIS — J4489 Other specified chronic obstructive pulmonary disease: Secondary | ICD-10-CM | POA: Insufficient documentation

## 2012-03-11 ENCOUNTER — Other Ambulatory Visit: Payer: Self-pay | Admitting: Endocrinology

## 2012-03-26 ENCOUNTER — Other Ambulatory Visit (INDEPENDENT_AMBULATORY_CARE_PROVIDER_SITE_OTHER): Payer: Medicaid Other

## 2012-03-26 ENCOUNTER — Encounter: Payer: Self-pay | Admitting: Endocrinology

## 2012-03-26 ENCOUNTER — Ambulatory Visit (INDEPENDENT_AMBULATORY_CARE_PROVIDER_SITE_OTHER): Payer: Medicaid Other | Admitting: Endocrinology

## 2012-03-26 VITALS — BP 130/78 | HR 90 | Temp 98.5°F | Ht 65.0 in | Wt 131.0 lb

## 2012-03-26 DIAGNOSIS — R319 Hematuria, unspecified: Secondary | ICD-10-CM

## 2012-03-26 DIAGNOSIS — E109 Type 1 diabetes mellitus without complications: Secondary | ICD-10-CM

## 2012-03-26 LAB — MICROALBUMIN / CREATININE URINE RATIO: Creatinine,U: 9.1 mg/dL

## 2012-03-26 NOTE — Patient Instructions (Addendum)
good diet and exercise habits significanly improve the control of your diabetes.  please let me know if you wish to be referred to a dietician.  high blood sugar is very risky to your health.  you should see an eye doctor every year.  You are at higher than average risk for pneumonia and hepatitis-B.  You should be vaccinated against both.   controlling your blood pressure and cholesterol drastically reduces the damage diabetes does to your body.  this also applies to quitting smoking.  please discuss these with your doctor.  you should take an aspirin every day, unless you have been advised by a doctor not to. check your blood sugar 4 times a day--before the 3 meals, and at bedtime.  also check if you have symptoms of your blood sugar being too high or too low.  please keep a record of the readings and bring it to your next appointment here.  please call us sooner if your blood sugar goes below 70, or if you have a lot of readings over 200.  blood tests are being requested for you today.  You will receive a letter with results. Please come back for a follow-up appointment in 6 weeks.

## 2012-03-26 NOTE — Progress Notes (Signed)
Subjective:    Patient ID: [name], female    DOB: Dec 06, 1975, 36 y.o.   MRN: [id number]  HPI Pt returns for f/u of type 1 DM (dx'ed 1989; complicated by retinopathy; therapy has been limited by noncompliance).  no cbg record, but states cbg's are much better over the past few days.  She says control was poor up until then, as she as not keeping up with it well.  pt states she feels well in general.  Past Medical History  Diagnosis Date  . DIABETES MELLITUS, TYPE I 02/02/2007  . Anxiety state, unspecified 03/26/2009  . DEPRESSION 02/16/2009  . ASTHMA 02/02/2007  . Shortness of breath 09/25/2007  . GERD 12/21/2008  . Endometriosis     Past Surgical History  Procedure Date  . Abdominal hysterectomy 2009    BSO    History   Social History  . Marital Status: Legally Separated    Spouse Name: N/A    Number of Children: 2  . Years of Education: N/A   Occupational History  .     Social History Main Topics  . Smoking status: Current Everyday Smoker -- 2.0 packs/day for 20 years  . Smokeless tobacco: Never Used  . Alcohol Use: No  . Drug Use: No  . Sexually Active: Yes   Other Topics Concern  . Not on file   Social History Narrative   2 children--pt has custody. Husband pays child support.Daily Caffeine Use-3 2 liters a dayPt does not get regular exercise.    Current Outpatient Prescriptions on File Prior to Visit  Medication Sig Dispense Refill  . albuterol (PROVENTIL HFA) 108 (90 BASE) MCG/ACT inhaler Inhale 2 puffs into the lungs every 6 (six) hours as needed. For shortness of breath      . ALPRAZolam (XANAX) 0.25 MG tablet Take 0.25 mg by mouth at bedtime as needed. Anxiety.      . Blood Glucose Monitoring Suppl (ACCU-CHEK AVIVA PLUS) W/DEVICE KIT 1 Device by Does not apply route once.  1 kit  0  . Fluticasone-Salmeterol (ADVAIR DISKUS) 100-50 MCG/DOSE AEPB Inhale 1 puff into the lungs every 12 (twelve) hours.        Marland Kitchen glucose blood (ACCU-CHEK AVIVA PLUS) test  strip 1 each by Other route 4 (four) times daily. And lancets 250.01.  (variable glucoses).  100 each  12  . Insulin Pen Needle 31G X 8 MM MISC Use as directed       . naproxen (NAPROSYN) 500 MG tablet Take 500 mg by mouth 2 (two) times daily with a meal.      . NOVOLOG FLEXPEN 100 UNIT/ML injection INJECT 14 UNITS SUBCUTANEOUSLY THREE TIMES DAILY BEFORE MEALS  15 mL  2  . omeprazole (PRILOSEC) 20 MG capsule TAKE ONE CAPSULE BY MOUTH EVERY DAY  30 capsule  1  . PARoxetine (PAXIL) 40 MG tablet TAKE ONE TABLET BY MOUTH EVERY DAY  30 tablet  3  . rOPINIRole (REQUIP) 0.5 MG tablet TAKE THREE TABLETS BY MOUTH AT BEDTIME  90 tablet  2  . DISCONTD: glycopyrrolate (ROBINUL-FORTE) 2 MG tablet Take 1-2 tablets by mouth daily as needed for cramping and spasms  60 tablet  1    No Known Allergies  Family History  Problem Relation Age of Onset  . Diabetes Mother   . Diabetes Maternal Aunt   . Emphysema Maternal Aunt   . Emphysema Maternal Uncle   . Diabetes Maternal Grandfather   . Cancer Paternal Grandmother  Lung Cancer    BP 130/78  Pulse 90  Temp 98.5 F (36.9 C) (Oral)  Ht 5\' 5"  (1.651 m)  Wt 131 lb (59.421 kg)  BMI 21.80 kg/m2  SpO2 97%  Review of Systems denies hypoglycemia    Objective:   Physical Exam Pulses: dorsalis pedis intact bilat.   Feet: no deformity.  no ulcer on the feet.  feet are of normal color and temp.  no edema Neuro: sensation is intact to touch on the feet  Lab Results  Component Value Date   HGBA1C 9.8* 03/26/2012       Assessment & Plan:  DN, therapy limited by noncompliance.  i'll do the best i can.

## 2012-03-27 ENCOUNTER — Encounter: Payer: Self-pay | Admitting: Endocrinology

## 2012-03-28 ENCOUNTER — Telehealth: Payer: Self-pay | Admitting: *Deleted

## 2012-03-28 NOTE — Telephone Encounter (Signed)
Called pt to inform of lab results, pt informed (letter also mailed to pt). 

## 2012-04-04 ENCOUNTER — Other Ambulatory Visit: Payer: Self-pay | Admitting: Endocrinology

## 2012-05-03 ENCOUNTER — Encounter: Payer: Self-pay | Admitting: Gastroenterology

## 2012-05-04 ENCOUNTER — Other Ambulatory Visit: Payer: Self-pay | Admitting: Endocrinology

## 2012-05-08 ENCOUNTER — Other Ambulatory Visit: Payer: Self-pay | Admitting: Endocrinology

## 2012-07-02 ENCOUNTER — Other Ambulatory Visit: Payer: Self-pay | Admitting: Endocrinology

## 2012-07-06 ENCOUNTER — Emergency Department (HOSPITAL_COMMUNITY)
Admission: EM | Admit: 2012-07-06 | Discharge: 2012-07-06 | Disposition: A | Discharge status: Home / Self Care | Payer: Medicaid Other | Source: Home / Self Care | Attending: Emergency Medicine | Admitting: Emergency Medicine

## 2012-07-06 ENCOUNTER — Emergency Department (INDEPENDENT_AMBULATORY_CARE_PROVIDER_SITE_OTHER): Payer: Medicaid Other

## 2012-07-06 ENCOUNTER — Encounter (HOSPITAL_COMMUNITY): Payer: Self-pay | Admitting: *Deleted

## 2012-07-06 DIAGNOSIS — E1149 Type 2 diabetes mellitus with other diabetic neurological complication: Secondary | ICD-10-CM

## 2012-07-06 DIAGNOSIS — J111 Influenza due to unidentified influenza virus with other respiratory manifestations: Secondary | ICD-10-CM

## 2012-07-06 DIAGNOSIS — G909 Disorder of the autonomic nervous system, unspecified: Secondary | ICD-10-CM

## 2012-07-06 DIAGNOSIS — E1143 Type 2 diabetes mellitus with diabetic autonomic (poly)neuropathy: Secondary | ICD-10-CM

## 2012-07-06 LAB — POCT I-STAT, CHEM 8
Chloride: 104 mEq/L (ref 96–112)
Glucose, Bld: 101 mg/dL — ABNORMAL HIGH (ref 70–99)
HCT: 46 % (ref 36.0–46.0)
Potassium: 3.6 mEq/L (ref 3.5–5.1)

## 2012-07-06 MED ORDER — DOXYCYCLINE HYCLATE 100 MG PO TABS: 100.0000 mg | ORAL_TABLET | Freq: Two times a day (BID) | ORAL | Status: DC

## 2012-07-06 MED ORDER — OSELTAMIVIR PHOSPHATE 75 MG PO CAPS: 75.0000 mg | ORAL_CAPSULE | Freq: Two times a day (BID) | ORAL | Status: DC

## 2012-07-06 MED ORDER — HYDROCOD POLST-CHLORPHEN POLST 10-8 MG/5ML PO LQCR: 5.0000 mL | Freq: Two times a day (BID) | ORAL | Status: DC | PRN

## 2012-07-06 NOTE — ED Notes (Addendum)
Patient complains of cough and chest congestion, fever/chills, and states she can't breath while lying down x 1 week. Patient also states that she has had gas and diarrhea with some nausea for past week as well. Denies vomiting. Patient states she's been using Albuterol to help with breathing, but it makes her dizzy and gives her the shakes. She stated it has always done that.

## 2012-07-06 NOTE — ED Provider Notes (Signed)
Chief Complaint  Patient presents with  . URI    History of Present Illness:   [name] is a 36 year old type I diabetic who has had a one-week history of cough productive yellow sputum, wheezing, chest tightness, and chest pain. She has temperature 100.7 yesterday, chills, and body aches. She's also had sore throat and nausea. She denies any nasal congestion or rhinorrhea.  A secondary problem is that of excessive gas, diarrhea, and bloating. This is a frequent problem that she's had in the past. She does get good relief from this with antibiotics. She denies any blood in the stool. She has been a type I diabetic for 27 years. She controls this with insulin pens. She is followed by Dr. Everardo All. She also has asthma and is on Advair and albuterol. She also takes Paxil, omeprazole, ropinirole, and Xanax.  Review of Systems:  Other than noted above, the patient denies any of the following symptoms. Systemic:  No fever, chills, sweats, fatigue, myalgias, headache, or anorexia. Eye:  No redness, pain or drainage. ENT:  No earache, ear congestion, nasal congestion, sneezing, rhinorrhea, sinus pressure, sinus pain, post nasal drip, or sore throat. Lungs:  No cough, sputum production, wheezing, shortness of breath, or chest pain. GI:  No abdominal pain, nausea, vomiting, or diarrhea.  PMFSH:  Past medical history, family history, social history, meds, and allergies were reviewed.  Physical Exam:   Vital signs:  BP 134/84  Pulse 102  Temp 99.1 F (37.3 C) (Oral)  Resp 18  SpO2 100% General:  Alert, in no distress. Eye:  No conjunctival injection or drainage. Lids were normal. ENT:  TMs and canals were normal, without erythema or inflammation.  Nasal mucosa was clear and uncongested, without drainage.  Mucous membranes were moist.  Pharynx was clear, without exudate or drainage.  There were no oral ulcerations or lesions. Neck:  Supple, no adenopathy, tenderness or mass. Lungs:  No respiratory  distress.  Lungs were clear to auscultation, without wheezes, rales or rhonchi.  Breath sounds were clear and equal bilaterally.  Heart:  Regular rhythm, without gallops, murmers or rubs. Skin:  Clear, warm, and dry, without rash or lesions.  Labs:   Results for orders placed during the hospital encounter of 07/06/12  POCT I-STAT, CHEM 8      Component Value Range   Sodium 139  135 - 145 mEq/L   Potassium 3.6  3.5 - 5.1 mEq/L   Chloride 104  96 - 112 mEq/L   BUN 5 (*) 6 - 23 mg/dL   Creatinine, Ser 0.45  0.50 - 1.10 mg/dL   Glucose, Bld 409 (*) 70 - 99 mg/dL   Calcium, Ion 8.11  9.14 - 1.23 mmol/L   TCO2 26  0 - 100 mmol/L   Hemoglobin 15.6 (*) 12.0 - 15.0 g/dL   HCT 78.2  95.6 - 21.3 %    Radiology:  Dg Chest 2 View  07/06/2012  *RADIOLOGY REPORT*  Clinical Data: Cough for 1 week, diabetes  CHEST - 2 VIEW  Comparison: 05/10/2010  Findings: Normal heart size, mediastinal contours, and pulmonary vascularity. Emphysematous and bronchitic changes consistent with COPD. No acute infiltrate, pleural effusion, or pneumothorax. Bones unremarkable.  IMPRESSION: COPD. No acute abnormalities.   Original Report Authenticated By: Ulyses Southward, M.D.    I reviewed the images independently and personally and concur with the radiologist's findings.  Assessment:  The primary encounter diagnosis was Influenza-like illness. A diagnosis of Diabetic autonomic neuropathy was also pertinent to this  visit.  Plan:   1.  The following meds were prescribed:   New Prescriptions   CHLORPHENIRAMINE-HYDROCODONE (TUSSIONEX) 10-8 MG/5ML LQCR    Take 5 mLs by mouth every 12 (twelve) hours as needed.   DOXYCYCLINE (VIBRA-TABS) 100 MG TABLET    Take 1 tablet (100 mg total) by mouth 2 (two) times daily.   OSELTAMIVIR (TAMIFLU) 75 MG CAPSULE    Take 1 capsule (75 mg total) by mouth every 12 (twelve) hours.   2.  The patient was instructed in symptomatic care and handouts were given. 3.  The patient was told to return if  becoming worse in any way, if no better in 3 or 4 days, and given some red flag symptoms that would indicate earlier return.   Reuben Likes, MD 07/06/12 [phone]

## 2012-07-20 ENCOUNTER — Other Ambulatory Visit: Payer: Self-pay | Admitting: Endocrinology

## 2012-07-23 ENCOUNTER — Telehealth: Payer: Self-pay | Admitting: Endocrinology

## 2012-07-23 MED ORDER — ROPINIROLE HCL 0.5 MG PO TABS: 0.5000 mg | ORAL_TABLET | Freq: Three times a day (TID) | ORAL | Status: DC

## 2012-07-23 NOTE — Telephone Encounter (Signed)
The patient is currently out of her Ropinirole rx and it was denied by Dr. Everardo All because she needed an office visit.  The patient is scheduled for 08/02/12 but is unable to sleep due to her symptoms.  Patient is wondering if it is possible to get prescription now-at least enough to get her to her appointment with Dr. Everardo All?  Patient telephone number is [phone].

## 2012-07-23 NOTE — Telephone Encounter (Signed)
Please refill x 1 pending ov 

## 2012-07-26 ENCOUNTER — Ambulatory Visit (INDEPENDENT_AMBULATORY_CARE_PROVIDER_SITE_OTHER): Payer: Medicaid Other | Admitting: Endocrinology

## 2012-07-26 ENCOUNTER — Encounter: Payer: Self-pay | Admitting: Endocrinology

## 2012-07-26 VITALS — BP 110/74 | HR 75 | Temp 97.8°F | Wt 132.0 lb

## 2012-07-26 DIAGNOSIS — E109 Type 1 diabetes mellitus without complications: Secondary | ICD-10-CM

## 2012-07-26 MED ORDER — INSULIN GLARGINE 100 UNIT/ML ~~LOC~~ SOLN: 4.0000 [IU] | Freq: Every day | SUBCUTANEOUS | Status: DC

## 2012-07-26 NOTE — Progress Notes (Signed)
Subjective:    Patient ID: [name], female    DOB: 1975/07/28, 37 y.o.   MRN: [id number]  HPI Pt returns for f/u of type 1 DM (dx'ed 1989; complicated by retinopathy; therapy has been limited by noncompliance).  no cbg record, but she says control is poor, as she is not keeping up with it well. she says cbg's are highest in am (300's).    pt states she feels well in general, except for fatigue.  She takes a widely varying dosage of insulin.   Past Medical History  Diagnosis Date  . DIABETES MELLITUS, TYPE I 02/02/2007  . Anxiety state, unspecified 03/26/2009  . DEPRESSION 02/16/2009  . ASTHMA 02/02/2007  . Shortness of breath 09/25/2007  . GERD 12/21/2008  . Endometriosis     Past Surgical History  Procedure Date  . Abdominal hysterectomy 2009    BSO    History   Social History  . Marital Status: Legally Separated    Spouse Name: N/A    Number of Children: 2  . Years of Education: N/A   Occupational History  .     Social History Main Topics  . Smoking status: Current Every Day Smoker -- 1.0 packs/day for 20 years    Types: Cigarettes  . Smokeless tobacco: Never Used  . Alcohol Use: No  . Drug Use: No  . Sexually Active: Yes   Other Topics Concern  . Not on file   Social History Narrative   2 children--pt has custody. Husband pays child support.Daily Caffeine Use-3 2 liters a dayPt does not get regular exercise.    Current Outpatient Prescriptions on File Prior to Visit  Medication Sig Dispense Refill  . ACCU-CHEK AVIVA PLUS test strip USE TO TEST BLOOD SUGAR 4 TIMES DAILY  100 strip  11  . albuterol (PROVENTIL HFA) 108 (90 BASE) MCG/ACT inhaler Inhale 2 puffs into the lungs every 6 (six) hours as needed. For shortness of breath      . ALPRAZolam (XANAX) 0.25 MG tablet Take 0.25 mg by mouth at bedtime as needed. Anxiety.      . Blood Glucose Monitoring Suppl (ACCU-CHEK AVIVA PLUS) W/DEVICE KIT 1 Device by Does not apply route once.  1 kit  0  .  chlorpheniramine-HYDROcodone (TUSSIONEX) 10-8 MG/5ML LQCR Take 5 mLs by mouth every 12 (twelve) hours as needed.  140 mL  0  . doxycycline (VIBRA-TABS) 100 MG tablet Take 1 tablet (100 mg total) by mouth 2 (two) times daily.  20 tablet  0  . Fluticasone-Salmeterol (ADVAIR DISKUS) 100-50 MCG/DOSE AEPB Inhale 1 puff into the lungs every 12 (twelve) hours.        . insulin aspart (NOVOLOG FLEXPEN) 100 UNIT/ML injection 3 times a day (just before each meal) 12-21-10 units.      . Insulin Pen Needle 31G X 8 MM MISC Use as directed       . naproxen (NAPROSYN) 500 MG tablet Take 500 mg by mouth 2 (two) times daily with a meal.      . omeprazole (PRILOSEC) 20 MG capsule TAKE ONE CAPSULE BY MOUTH EVERY DAY. DUE FOR FOLLOW UP OFFICE VISIT WITH DOCTOR FOR ADDITIONAL REFILLS  30 capsule  0  . omeprazole (PRILOSEC) 20 MG capsule Take one capsule by mouth every day.  30 capsule  11  . oseltamivir (TAMIFLU) 75 MG capsule Take 1 capsule (75 mg total) by mouth every 12 (twelve) hours.  10 capsule  0  . PARoxetine (PAXIL) 40  MG tablet TAKE TWO TABLETS BY MOUTH EVERY DAY  60 tablet  2  . rOPINIRole (REQUIP) 0.5 MG tablet Take 1 tablet (0.5 mg total) by mouth 3 (three) times daily.  90 tablet  1  . insulin glargine (LANTUS SOLOSTAR) 100 UNIT/ML injection Inject 4 Units into the skin at bedtime.  15 mL  12  . [DISCONTINUED] glycopyrrolate (ROBINUL-FORTE) 2 MG tablet Take 1-2 tablets by mouth daily as needed for cramping and spasms  60 tablet  1    No Known Allergies  Family History  Problem Relation Age of Onset  . Diabetes Mother   . Diabetes Maternal Aunt   . Emphysema Maternal Aunt   . Emphysema Maternal Uncle   . Diabetes Maternal Grandfather   . Cancer Paternal Grandmother     Lung Cancer   BP 110/74  Pulse 75  Temp 97.8 F (36.6 C) (Oral)  Wt 132 lb (59.875 kg)  SpO2 96%  Review of Systems denies hypoglycemia    Objective:   Physical Exam VITAL SIGNS:  See vs page GENERAL: no distress SKIN:   Insulin injection sites at the anterior abdomen are normal, except for a few ecchymoses.     Assessment & Plan:  DM: therapy limited by noncompliance with insulin dosing and cbg recording.  i'll do the best i can.

## 2012-07-26 NOTE — Patient Instructions (Addendum)
Add lantus, 4 units at bedtime.  i have sent a prescription to your pharmacy. check your blood sugar 4 times a day--before the 3 meals, and at bedtime.  also check if you have symptoms of your blood sugar being too high or too low.  please keep a record of the readings and bring it to your next appointment here.  please call us sooner if your blood sugar goes below 70, or if you have a lot of readings over 200. Please come back for a follow-up appointment in 2 weeks.   Please get future refills of your ropinirole from your regular doctor.

## 2012-08-02 ENCOUNTER — Ambulatory Visit: Payer: Self-pay | Admitting: Endocrinology

## 2012-09-23 ENCOUNTER — Other Ambulatory Visit: Payer: Self-pay | Admitting: Endocrinology

## 2012-10-03 ENCOUNTER — Other Ambulatory Visit: Payer: Self-pay | Admitting: Endocrinology

## 2012-10-04 ENCOUNTER — Other Ambulatory Visit: Payer: Self-pay | Admitting: *Deleted

## 2012-10-04 MED ORDER — INSULIN ASPART 100 UNIT/ML ~~LOC~~ SOLN: SUBCUTANEOUS | Status: DC

## 2012-10-14 ENCOUNTER — Encounter: Payer: Self-pay | Admitting: Gastroenterology

## 2012-10-21 ENCOUNTER — Other Ambulatory Visit: Payer: Self-pay | Admitting: *Deleted

## 2012-10-21 ENCOUNTER — Other Ambulatory Visit: Payer: Self-pay | Admitting: Endocrinology

## 2012-10-21 MED ORDER — PAROXETINE HCL 40 MG PO TABS: ORAL_TABLET | ORAL | Status: DC

## 2012-10-21 NOTE — Telephone Encounter (Signed)
Refill x 1 Ov is due 

## 2012-10-25 ENCOUNTER — Other Ambulatory Visit: Payer: Self-pay | Admitting: *Deleted

## 2012-10-25 MED ORDER — ROPINIROLE HCL 0.5 MG PO TABS: 0.5000 mg | ORAL_TABLET | Freq: Three times a day (TID) | ORAL | Status: DC

## 2012-10-30 ENCOUNTER — Emergency Department (HOSPITAL_COMMUNITY): Payer: Medicaid Other

## 2012-10-30 ENCOUNTER — Encounter (HOSPITAL_COMMUNITY): Payer: Self-pay | Admitting: Emergency Medicine

## 2012-10-30 ENCOUNTER — Emergency Department (HOSPITAL_COMMUNITY)
Admission: EM | Admit: 2012-10-30 | Discharge: 2012-10-31 | Disposition: A | Discharge status: Home / Self Care | Payer: Medicaid Other | Attending: Emergency Medicine | Admitting: Emergency Medicine

## 2012-10-30 DIAGNOSIS — Z8742 Personal history of other diseases of the female genital tract: Secondary | ICD-10-CM | POA: Insufficient documentation

## 2012-10-30 DIAGNOSIS — M25569 Pain in unspecified knee: Secondary | ICD-10-CM | POA: Insufficient documentation

## 2012-10-30 DIAGNOSIS — K219 Gastro-esophageal reflux disease without esophagitis: Secondary | ICD-10-CM | POA: Insufficient documentation

## 2012-10-30 DIAGNOSIS — F17200 Nicotine dependence, unspecified, uncomplicated: Secondary | ICD-10-CM | POA: Insufficient documentation

## 2012-10-30 DIAGNOSIS — J45909 Unspecified asthma, uncomplicated: Secondary | ICD-10-CM | POA: Insufficient documentation

## 2012-10-30 DIAGNOSIS — Z79899 Other long term (current) drug therapy: Secondary | ICD-10-CM | POA: Insufficient documentation

## 2012-10-30 DIAGNOSIS — F3289 Other specified depressive episodes: Secondary | ICD-10-CM | POA: Insufficient documentation

## 2012-10-30 DIAGNOSIS — M25561 Pain in right knee: Secondary | ICD-10-CM

## 2012-10-30 DIAGNOSIS — F329 Major depressive disorder, single episode, unspecified: Secondary | ICD-10-CM | POA: Insufficient documentation

## 2012-10-30 DIAGNOSIS — Z794 Long term (current) use of insulin: Secondary | ICD-10-CM | POA: Insufficient documentation

## 2012-10-30 DIAGNOSIS — E109 Type 1 diabetes mellitus without complications: Secondary | ICD-10-CM | POA: Insufficient documentation

## 2012-10-30 DIAGNOSIS — F411 Generalized anxiety disorder: Secondary | ICD-10-CM | POA: Insufficient documentation

## 2012-10-30 MED ORDER — HYDROCODONE-ACETAMINOPHEN 5-325 MG PO TABS: 1.0000 | ORAL_TABLET | Freq: Four times a day (QID) | ORAL | Status: DC | PRN

## 2012-10-30 MED ORDER — NAPROXEN 375 MG PO TABS: 375.0000 mg | ORAL_TABLET | Freq: Two times a day (BID) | ORAL | Status: DC

## 2012-10-30 MED ORDER — HYDROCODONE-ACETAMINOPHEN 5-325 MG PO TABS
1.0000 | ORAL_TABLET | Freq: Once | ORAL | Status: AC
  Administered 2012-10-31: 1 via ORAL
  Filled 2012-10-30: qty 1

## 2012-10-30 NOTE — ED Notes (Signed)
PT. REPORTS RIGHT KNEE PAIN ONSET 2 YEARS AGO WORSE PAST SEVERAL DAYS DENIES INJURY / AMBULATORY.

## 2012-10-30 NOTE — ED Provider Notes (Signed)
History    This chart was scribed for non-physician practitioner working with [name], DO by [name], ED Scribe. This patient was seen in room TR05C/TR05C and the patient's care was started at 2047.   CSN: [id number]  Arrival date & time 10/30/12  2047   First MD Initiated Contact with Patient 10/30/12 2256      Chief Complaint  Patient presents with  . Knee Pain     The history is provided by the patient. No language interpreter was used.    [name] is a 37 y.o. female who presents to the Emergency Department complaining of chronic pain in the R knee of 2 years with new, gradually worsening, constant flare up starting in the last several days. Pt denies any recent injury to the area. States her knee will give out on her and has trouble walking up and down steps. Reports having worsened pain with bending of the knee. She denies fever or warmth.    Pt is a current everyday smoker but denies alcohol use.  Past Medical History  Diagnosis Date  . DIABETES MELLITUS, TYPE I 02/02/2007  . Anxiety state, unspecified 03/26/2009  . DEPRESSION 02/16/2009  . ASTHMA 02/02/2007  . Shortness of breath 09/25/2007  . GERD 12/21/2008  . Endometriosis     Past Surgical History  Procedure Laterality Date  . Abdominal hysterectomy  2009    BSO    Family History  Problem Relation Age of Onset  . Diabetes Mother   . Diabetes Maternal Aunt   . Emphysema Maternal Aunt   . Emphysema Maternal Uncle   . Diabetes Maternal Grandfather   . Cancer Paternal Grandmother     Lung Cancer    History  Substance Use Topics  . Smoking status: Current Every Day Smoker -- 1.00 packs/day for 20 years    Types: Cigarettes  . Smokeless tobacco: Never Used  . Alcohol Use: No    No OB history provided.   Review of Systems  Constitutional: Negative for fever.  Cardiovascular: Negative for chest pain.  Musculoskeletal: Positive for arthralgias. Negative for back pain.  Skin: Negative  for rash.  Neurological: Negative for weakness.    Allergies  Review of patient's allergies indicates no known allergies.  Home Medications   Current Outpatient Rx  Name  Route  Sig  Dispense  Refill  . ACCU-CHEK AVIVA PLUS test strip      USE TO TEST BLOOD SUGAR 4 TIMES DAILY   100 strip   11   . albuterol (PROVENTIL HFA) 108 (90 BASE) MCG/ACT inhaler   Inhalation   Inhale 2 puffs into the lungs every 6 (six) hours as needed. For shortness of breath         . atorvastatin (LIPITOR) 10 MG tablet   Oral   Take 10 mg by mouth daily.         . Blood Glucose Monitoring Suppl (ACCU-CHEK AVIVA PLUS) W/DEVICE KIT   Does not apply   1 Device by Does not apply route once.   1 kit   0   . Fluticasone-Salmeterol (ADVAIR DISKUS) 250-50 MCG/DOSE AEPB   Inhalation   Inhale 1 puff into the lungs 2 (two) times daily. Inhale 1 puff into the lungs 2 (two) times daily.         . insulin aspart (NOVOLOG) 100 UNIT/ML injection   Subcutaneous   Inject 6-12 Units into the skin 3 (three) times daily before meals. Use 12 units  in AM and PM and 6 units with lunch         . Insulin Pen Needle 31G X 8 MM MISC      Use as directed          . naproxen (NAPROSYN) 500 MG tablet   Oral   Take 500 mg by mouth 2 (two) times daily as needed. For pain         . omeprazole (PRILOSEC) 20 MG capsule      Take one capsule by mouth every day.   30 capsule   11   . PARoxetine (PAXIL) 40 MG tablet   Oral   Take 40 mg by mouth every morning.         Marland Kitchen rOPINIRole (REQUIP) 0.5 MG tablet   Oral   Take 1.5 mg by mouth at bedtime.           BP 173/93  Pulse 98  Temp(Src) 99.6 F (37.6 C) (Oral)  Resp 16  SpO2 98%  Physical Exam  Nursing note and vitals reviewed. Constitutional: She appears well-developed and well-nourished. No distress.  HENT:  Head: Normocephalic and atraumatic.  Eyes: Conjunctivae and EOM are normal.  Neck: Normal range of motion. Neck supple.   Cardiovascular: Normal rate, regular rhythm and intact distal pulses.   Capillary refill < 3 secs  Pulmonary/Chest: Effort normal and breath sounds normal.  Musculoskeletal: She exhibits tenderness. She exhibits no edema.  RLE; ROM: pain w extension painfree flexion ttp along patella  Neurological: She is alert. Coordination normal.  Sensation intact Strength intact in flexion not in extension d/t pain  Skin: Skin is warm and dry. She is not diaphoretic.  No tenting of the skin  Psychiatric: She has a normal mood and affect.    ED Course  Procedures (including critical care time)  DIAGNOSTIC STUDIES: Oxygen Saturation is 98% on room air, normal by my interpretation.    COORDINATION OF CARE: 10:57 PM-Discussed treatment plan with pt at bedside and pt agreed to plan. Pt will be given a knee brace, crutches, and anti-inflammatories.    Labs Reviewed - No data to display Dg Knee Complete 4 Views Right  10/30/2012  *RADIOLOGY REPORT*  Clinical Data: Generalized right knee pain for 2 months.  RIGHT KNEE - COMPLETE 4+ VIEW  Comparison: None.  Findings: There is no evidence of fracture or dislocation.  The joint spaces are preserved.  Subcortical cystic change is noted along the articular surface of the patella, without significant cortical irregularity.  No significant joint effusion is seen.  The visualized soft tissues are normal in appearance.  IMPRESSION:  1.  No evidence of fracture or dislocation. 2.  Subcortical cystic change along the articular surface of the patella, without significant cortical irregularity.   Original Report Authenticated By: Tonia Ghent, M.D.      No diagnosis found.    MDM  Chronic knee pain x 2 yrs worsening in last 3 days, no acute trauma Patient X-Ray negative for obvious fracture or dislocation. Pain managed in ED. Pt advised to follow up with orthopedics if symptoms persist. Patient given brace while in ED, conservative therapy recommended and  discussed. Patient will be dc home & is agreeable with above plan.       I personally performed the services described in this documentation, which was scribed in my presence. The recorded information has been reviewed and is accurate.      Jaci Carrel, [place] 10/31/12 0004

## 2012-10-31 NOTE — ED Provider Notes (Signed)
Medical screening examination/treatment/procedure(s) were performed by non-physician practitioner and as supervising physician I was immediately available for consultation/collaboration.  Andron Marrazzo M Tameron Lama, MD 10/31/12 0644 

## 2012-11-04 ENCOUNTER — Encounter: Payer: Self-pay | Admitting: *Deleted

## 2012-11-11 ENCOUNTER — Ambulatory Visit (INDEPENDENT_AMBULATORY_CARE_PROVIDER_SITE_OTHER): Payer: Medicaid Other | Admitting: Gastroenterology

## 2012-11-11 ENCOUNTER — Encounter: Payer: Self-pay | Admitting: Gastroenterology

## 2012-11-11 VITALS — BP 130/78 | HR 100 | Ht 66.0 in | Wt 132.1 lb

## 2012-11-11 DIAGNOSIS — R197 Diarrhea, unspecified: Secondary | ICD-10-CM

## 2012-11-11 MED ORDER — DOXYCYCLINE HYCLATE 50 MG PO CAPS: 100.0000 mg | ORAL_CAPSULE | Freq: Two times a day (BID) | ORAL | Status: DC

## 2012-11-11 NOTE — Patient Instructions (Addendum)
You have been given a separate informational sheet regarding your tobacco use, the importance of quitting and local resources to help you quit. Call back in 7-10 days if no better Medication is being sent to your pharmacy

## 2012-11-11 NOTE — Progress Notes (Signed)
History of Present Illness:  [name] has returned for followup of diarrhea. Last year when she was evaluated it was felt that she had bacterial overgrowth and responded well to Flagyl. Symptoms slowly returned. Although she was instructed to take Flagyl on a regular basis she did not do so due to some confusion. Symptoms have recurred including severe foul-smelling flatus, distention and diarrhea. She was treated with doxycycline in December with significant improvement in symptoms lastingt 3 months. Recently she received Flagyl without much improvement.    Review of Systems: Pertinent positive and negative review of systems were noted in the above HPI section. All other review of systems were otherwise negative.    Current Medications, Allergies, Past Medical History, Past Surgical History, Family History and Social History were reviewed in [employer] [facility] electronic medical record  Vital signs were reviewed in today's medical record. Physical Exam: General: Well developed , well nourished, no acute distress Skin: anicteric Head: Normocephalic and atraumatic Eyes:  sclerae anicteric, EOMI Ears: Normal auditory acuity Mouth: No deformity or lesions Lungs: Clear throughout to auscultation Heart: Regular rate and rhythm; no murmurs, rubs or bruits Abdomen: Soft, non tender but slightly  distended. No masses, hepatosplenomegaly or hernias noted. Normal Bowel sounds Rectal:deferred Musculoskeletal: Symmetrical with no gross deformities  Pulses:  Normal pulses noted Extremities: No clubbing, cyanosis, edema or deformities noted Neurological: Alert oriented x 4, grossly nonfocal Psychological:  Alert and cooperative. Normal mood and affect

## 2012-11-11 NOTE — Assessment & Plan Note (Signed)
I continue to suspect that patient suffers from bacterial overgrowth causing symptoms of excess flatulence, diarrhea and bloating. Absence of response to Flagyl is unexpected, however.  Recommendations #1 repeat trial of doxycycline. Patient was instructed to call in next 7-10 days. If she is improved then I will cycle this medication one month out of every month.

## 2012-11-18 ENCOUNTER — Telehealth: Payer: Self-pay | Admitting: Gastroenterology

## 2012-11-18 DIAGNOSIS — R197 Diarrhea, unspecified: Secondary | ICD-10-CM

## 2012-11-18 NOTE — Telephone Encounter (Signed)
.  Spoke with patient and she states she took the Doxycycline 2 tablet BID x 3 days and did not have any change. She states she thought she was to take it for 7 days but only had rx for 12 tablets. She did not improve with the 3 day treatment.

## 2012-11-18 NOTE — Telephone Encounter (Signed)
Per pt she is supposed to give an update on how she is doing on doxycycline. She says the prescription was written right but she didnt get enough capsules. She was supposed to take it for a week but only had enough for 3 days. She says she doesnt feel any changes.

## 2012-11-19 NOTE — Telephone Encounter (Signed)
Please prescribe doxycycline to complete a seven-day course and then call back

## 2012-11-20 MED ORDER — DOXYCYCLINE HYCLATE 50 MG PO CAPS: 100.0000 mg | ORAL_CAPSULE | Freq: Two times a day (BID) | ORAL | Status: DC

## 2012-11-20 NOTE — Telephone Encounter (Signed)
Pt aware and script sent to the pharmacy. 

## 2012-11-22 ENCOUNTER — Ambulatory Visit: Payer: Medicaid Other | Admitting: Endocrinology

## 2012-11-26 ENCOUNTER — Telehealth: Payer: Self-pay | Admitting: Gastroenterology

## 2012-11-26 DIAGNOSIS — R197 Diarrhea, unspecified: Secondary | ICD-10-CM

## 2012-11-26 NOTE — Telephone Encounter (Signed)
Pt states she is feeling much better since taking the doxycycline, she was calling to give an update. Dr. Arlyce Dice aware.

## 2012-11-27 MED ORDER — DOXYCYCLINE HYCLATE 50 MG PO CAPS: 100.0000 mg | ORAL_CAPSULE | Freq: Two times a day (BID) | ORAL | Status: DC

## 2012-11-27 NOTE — Telephone Encounter (Signed)
Instruct patient to take doxycycline one week out of every month. I should see her back in approximately 6 weeks

## 2012-11-27 NOTE — Telephone Encounter (Signed)
Spoke with pt and she is aware. Script sent to pharmacy for pt. Pt states she will call back and schedule the OV.

## 2012-11-27 NOTE — Addendum Note (Signed)
Addended by: Selinda Michaels R on: 11/27/2012 09:27 AM   Modules accepted: Orders

## 2013-01-23 ENCOUNTER — Other Ambulatory Visit: Payer: Self-pay | Admitting: Endocrinology

## 2013-01-24 ENCOUNTER — Other Ambulatory Visit: Payer: Self-pay | Admitting: *Deleted

## 2013-01-24 MED ORDER — INSULIN ASPART 100 UNIT/ML ~~LOC~~ SOLN: SUBCUTANEOUS | Status: DC

## 2013-02-08 ENCOUNTER — Encounter (HOSPITAL_COMMUNITY): Payer: Self-pay | Admitting: Emergency Medicine

## 2013-02-08 ENCOUNTER — Emergency Department (HOSPITAL_COMMUNITY)
Admission: EM | Admit: 2013-02-08 | Discharge: 2013-02-08 | Disposition: A | Discharge status: Home / Self Care | Payer: Medicaid Other | Attending: Emergency Medicine | Admitting: Emergency Medicine

## 2013-02-08 DIAGNOSIS — F17200 Nicotine dependence, unspecified, uncomplicated: Secondary | ICD-10-CM | POA: Insufficient documentation

## 2013-02-08 DIAGNOSIS — R072 Precordial pain: Secondary | ICD-10-CM | POA: Insufficient documentation

## 2013-02-08 DIAGNOSIS — R110 Nausea: Secondary | ICD-10-CM | POA: Insufficient documentation

## 2013-02-08 DIAGNOSIS — E109 Type 1 diabetes mellitus without complications: Secondary | ICD-10-CM | POA: Insufficient documentation

## 2013-02-08 DIAGNOSIS — Z794 Long term (current) use of insulin: Secondary | ICD-10-CM | POA: Insufficient documentation

## 2013-02-08 DIAGNOSIS — R079 Chest pain, unspecified: Secondary | ICD-10-CM

## 2013-02-08 DIAGNOSIS — F411 Generalized anxiety disorder: Secondary | ICD-10-CM | POA: Insufficient documentation

## 2013-02-08 DIAGNOSIS — J45901 Unspecified asthma with (acute) exacerbation: Secondary | ICD-10-CM | POA: Insufficient documentation

## 2013-02-08 DIAGNOSIS — F329 Major depressive disorder, single episode, unspecified: Secondary | ICD-10-CM | POA: Insufficient documentation

## 2013-02-08 DIAGNOSIS — K219 Gastro-esophageal reflux disease without esophagitis: Secondary | ICD-10-CM | POA: Insufficient documentation

## 2013-02-08 DIAGNOSIS — Z79899 Other long term (current) drug therapy: Secondary | ICD-10-CM | POA: Insufficient documentation

## 2013-02-08 DIAGNOSIS — F3289 Other specified depressive episodes: Secondary | ICD-10-CM | POA: Insufficient documentation

## 2013-02-08 DIAGNOSIS — Z8742 Personal history of other diseases of the female genital tract: Secondary | ICD-10-CM | POA: Insufficient documentation

## 2013-02-08 LAB — POCT I-STAT TROPONIN I: Troponin i, poc: 0 ng/mL (ref 0.00–0.08)

## 2013-02-08 LAB — BASIC METABOLIC PANEL
BUN: 8 mg/dL (ref 6–23)
Calcium: 9.6 mg/dL (ref 8.4–10.5)
GFR calc Af Amer: >90 mL/min (ref >90)
GFR calc non Af Amer: >90 mL/min (ref >90)
Glucose, Bld: 285 mg/dL — ABNORMAL HIGH (ref 70–99)
Potassium: 4.2 mEq/L (ref 3.5–5.1)
Sodium: 125 mEq/L — ABNORMAL LOW (ref 135–145)

## 2013-02-08 LAB — CBC
Hemoglobin: 15.7 g/dL — ABNORMAL HIGH (ref 12.0–15.0)
MCH: 31.8 pg (ref 26.0–34.0)
MCHC: 36.9 g/dL — ABNORMAL HIGH (ref 30.0–36.0)
RDW: 12.8 % (ref 11.5–15.5)

## 2013-02-08 MED ORDER — GI COCKTAIL ~~LOC~~
30.0000 mL | Freq: Once | ORAL | Status: AC
  Administered 2013-02-08: 30 mL via ORAL
  Filled 2013-02-08: qty 30

## 2013-02-08 NOTE — ED Provider Notes (Signed)
CSN: [id number]     Arrival date & time 02/08/13  1413 History     First MD Initiated Contact with Patient 02/08/13 1545     Chief Complaint  Patient presents with  . Chest Pain   (Consider location/radiation/quality/duration/timing/severity/associated sxs/prior Treatment) HPI..... burning in mid sternum intermittently since Wednesday.  Patient started Septra DS same day for groin infection.   No dyspnea, diaphoresis, exercising  intolerance, prolonged travel, prolonged immobilization.  Does complain of nausea. Patient thinks symptoms are related to the antibiotic.  TUMS do not makes symptoms better.  Severity is mild to moderate.  Cardiac risk factors include diabetes, cigarette smoking, hypercholesterolemia.  Past Medical History  Diagnosis Date  . DIABETES MELLITUS, TYPE I 02/02/2007  . Anxiety state, unspecified 03/26/2009  . DEPRESSION 02/16/2009  . ASTHMA 02/02/2007  . Shortness of breath 09/25/2007  . GERD 12/21/2008  . Endometriosis    Past Surgical History  Procedure Laterality Date  . Abdominal hysterectomy  2009    BSO   Family History  Problem Relation Age of Onset  . Diabetes Mother   . Diabetes Maternal Aunt   . Emphysema Maternal Aunt   . Emphysema Maternal Uncle   . Diabetes Maternal Grandfather   . Cancer Paternal Grandmother     Lung Cancer   History  Substance Use Topics  . Smoking status: Current Every Day Smoker -- 1.00 packs/day for 20 years    Types: Cigarettes  . Smokeless tobacco: Never Used  . Alcohol Use: No   OB History   Grav Para Term Preterm Abortions TAB SAB Ect Mult Living                 Review of Systems  All other systems reviewed and are negative.    Allergies  Review of patient's allergies indicates no known allergies.  Home Medications   Current Outpatient Rx  Name  Route  Sig  Dispense  Refill  . ACCU-CHEK AVIVA PLUS test strip      USE TO TEST BLOOD SUGAR 4 TIMES DAILY   100 strip   11   . albuterol (PROVENTIL HFA)  108 (90 BASE) MCG/ACT inhaler   Inhalation   Inhale 2 puffs into the lungs every 6 (six) hours as needed. For shortness of breath         . atorvastatin (LIPITOR) 10 MG tablet   Oral   Take 10 mg by mouth at bedtime.         . Blood Glucose Monitoring Suppl (ACCU-CHEK AVIVA PLUS) W/DEVICE KIT   Does not apply   1 Device by Does not apply route once.   1 kit   0   . Fluticasone-Salmeterol (ADVAIR DISKUS) 250-50 MCG/DOSE AEPB   Inhalation   Inhale 1 puff into the lungs 2 (two) times daily. Inhale 1 puff into the lungs 2 (two) times daily.         Marland Kitchen HYDROcodone-acetaminophen (NORCO/VICODIN) 5-325 MG per tablet      1 tablet. Take 1 tablet by mouth every 6 (six) hours as needed for Pain.         . insulin aspart (NOVOLOG FLEXPEN) 100 UNIT/ML injection      Inject into the skin. 6 units with breakfast, 6 u with lunch and 12 units  with supper   15 mL   1     PT NEEDS TO SCHEDULE A FOLLOW UP APPT.   Marland Kitchen Insulin Pen Needle 31G X 8 MM MISC  Use as directed          . omeprazole (PRILOSEC) 20 MG capsule   Oral   Take 20 mg by mouth 2 (two) times daily.         Marland Kitchen PARoxetine (PAXIL) 40 MG tablet   Oral   Take 40 mg by mouth daily.         Marland Kitchen rOPINIRole (REQUIP) 0.5 MG tablet   Oral   Take 1.5 mg by mouth at bedtime.         . sulfamethoxazole-trimethoprim (BACTRIM DS) 800-160 MG per tablet   Oral   Take 2 tablets by mouth every 12 (twelve) hours.          BP 136/71  Pulse 110  Temp(Src) 98 F (36.7 C) (Oral)  Resp 18  Ht 5\' 5"  (1.651 m)  Wt 132 lb (59.875 kg)  BMI 21.97 kg/m2  SpO2 97% Physical Exam  Nursing note and vitals reviewed. Constitutional: She is oriented to person, place, and time. She appears well-developed and well-nourished.  HENT:  Head: Normocephalic and atraumatic.  Eyes: Conjunctivae and EOM are normal. Pupils are equal, round, and reactive to light.  Neck: Normal range of motion. Neck supple.  Cardiovascular: Normal rate,  regular rhythm and normal heart sounds.   Pulmonary/Chest: Effort normal and breath sounds normal.  Abdominal: Soft. Bowel sounds are normal.  Musculoskeletal: Normal range of motion.  Neurological: She is alert and oriented to person, place, and time.  Skin: Skin is warm and dry.  Psychiatric: She has a normal mood and affect.    ED Course   Procedures (including critical care time)  Labs Reviewed  CBC - Abnormal; Notable for the following:    Hemoglobin 15.7 (*)    MCHC 36.9 (*)    All other components within normal limits  BASIC METABOLIC PANEL - Abnormal; Notable for the following:    Sodium 125 (*)    Chloride 91 (*)    Glucose, Bld 285 (*)    All other components within normal limits  GLUCOSE, CAPILLARY - Abnormal; Notable for the following:    Glucose-Capillary 260 (*)    All other components within normal limits  POCT I-STAT TROPONIN I   No results found. No diagnosis found.    Date: 02/08/2013  Rate: 117  Rhythm: sinus tachycardia  QRS Axis: normal  Intervals: normal  ST/T Wave abnormalities: normal  Conduction Disutrbances:none  Narrative Interpretation:   Old EKG Reviewed: none available  MDM  Patient decided to leave AGAINST MEDICAL ADVICE.  Vital signs were normal. Patient is ambulatory without dyspnea. Good color. Oxygenation was 100%.  Patient has primary care followup.   Risk factors for cardiac disease discussed  Donnetta Hutching, MD 02/08/13 1743

## 2013-02-08 NOTE — ED Notes (Signed)
CBG 260

## 2013-02-08 NOTE — ED Notes (Signed)
Pt reports mid chest pressure onset Wednesday with shortness of breath, heartburn and nausea. Pt started new antibiotic 5 days ago.

## 2013-03-09 ENCOUNTER — Other Ambulatory Visit: Payer: Self-pay | Admitting: Endocrinology

## 2013-03-31 ENCOUNTER — Other Ambulatory Visit: Payer: Self-pay | Admitting: Endocrinology

## 2013-04-01 ENCOUNTER — Other Ambulatory Visit: Payer: Self-pay

## 2013-04-01 DIAGNOSIS — Z0279 Encounter for issue of other medical certificate: Secondary | ICD-10-CM

## 2013-04-01 MED ORDER — PAROXETINE HCL 40 MG PO TABS: 40.0000 mg | ORAL_TABLET | Freq: Every day | ORAL | Status: DC

## 2013-04-01 MED ORDER — INSULIN ASPART 100 UNIT/ML ~~LOC~~ SOLN: SUBCUTANEOUS | Status: DC

## 2013-05-06 ENCOUNTER — Telehealth: Payer: Self-pay | Admitting: Endocrinology

## 2013-05-06 ENCOUNTER — Ambulatory Visit: Payer: Medicaid Other | Admitting: Endocrinology

## 2013-05-06 NOTE — Telephone Encounter (Signed)
Pt no showed today's follow up appt / [name]

## 2013-05-14 ENCOUNTER — Telehealth: Payer: Self-pay

## 2013-05-14 MED ORDER — ROPINIROLE HCL 0.5 MG PO TABS: 1.5000 mg | ORAL_TABLET | Freq: Every day | ORAL | Status: DC

## 2013-05-14 NOTE — Telephone Encounter (Signed)
Rx request for ropinirole 0.5 mg tablet-take 1 tablet by mouth three times daily.  Rx last filled on 03/31/13.  Pt no showed appt on 05/06/13 and has not rescheduled.  Pls advise.

## 2013-05-14 NOTE — Telephone Encounter (Signed)
Rx sent to pharmacy and noted that pt needs a follow up before more refills.

## 2013-05-14 NOTE — Telephone Encounter (Signed)
Refill x 1 No more refill until seen here

## 2013-05-29 ENCOUNTER — Other Ambulatory Visit: Payer: Self-pay | Admitting: *Deleted

## 2013-05-29 MED ORDER — GLUCOSE BLOOD VI STRP: ORAL_STRIP | Status: DC

## 2013-06-16 ENCOUNTER — Other Ambulatory Visit: Payer: Self-pay | Admitting: Endocrinology

## 2013-06-16 NOTE — Telephone Encounter (Signed)
Please refill x 1 Ov is due  

## 2013-07-09 ENCOUNTER — Telehealth: Payer: Self-pay | Admitting: Gastroenterology

## 2013-07-09 DIAGNOSIS — R197 Diarrhea, unspecified: Secondary | ICD-10-CM

## 2013-07-09 MED ORDER — DOXYCYCLINE HYCLATE 50 MG PO CAPS: 100.0000 mg | ORAL_CAPSULE | Freq: Two times a day (BID) | ORAL | Status: DC

## 2013-07-09 NOTE — Telephone Encounter (Signed)
Reviewed OV note and pt was supposed to have Doxycycline 50mg  2 tabs BID for 7 days. Script sent to the pharmacy.

## 2013-08-13 DIAGNOSIS — M6530 Trigger finger, unspecified finger: Secondary | ICD-10-CM | POA: Insufficient documentation

## 2013-08-20 ENCOUNTER — Other Ambulatory Visit: Payer: Self-pay | Admitting: Endocrinology

## 2013-08-20 MED ORDER — INSULIN ASPART 100 UNIT/ML ~~LOC~~ SOLN: SUBCUTANEOUS | Status: DC

## 2013-08-20 NOTE — Telephone Encounter (Signed)
Refill x 1 Ov is due 

## 2013-10-13 ENCOUNTER — Other Ambulatory Visit: Payer: Self-pay | Admitting: Endocrinology

## 2013-10-25 ENCOUNTER — Other Ambulatory Visit: Payer: Self-pay | Admitting: Endocrinology

## 2013-10-27 ENCOUNTER — Other Ambulatory Visit: Payer: Self-pay | Admitting: *Deleted

## 2013-10-27 ENCOUNTER — Other Ambulatory Visit: Payer: Self-pay | Admitting: Endocrinology

## 2013-10-31 ENCOUNTER — Other Ambulatory Visit: Payer: Self-pay | Admitting: Endocrinology

## 2013-11-03 ENCOUNTER — Other Ambulatory Visit: Payer: Self-pay | Admitting: Endocrinology

## 2013-11-03 NOTE — Telephone Encounter (Signed)
Please advise if ok to refill, pt was last seen on 07/26/2012. Thanks!

## 2013-11-07 DIAGNOSIS — Z720 Tobacco use: Secondary | ICD-10-CM | POA: Insufficient documentation

## 2013-11-07 DIAGNOSIS — G2581 Restless legs syndrome: Secondary | ICD-10-CM | POA: Insufficient documentation

## 2013-11-10 ENCOUNTER — Other Ambulatory Visit: Payer: Self-pay

## 2013-11-10 MED ORDER — PAROXETINE HCL 40 MG PO TABS: 40.0000 mg | ORAL_TABLET | Freq: Every day | ORAL | Status: DC

## 2013-11-16 ENCOUNTER — Other Ambulatory Visit: Payer: Self-pay | Admitting: Gastroenterology

## 2013-11-22 ENCOUNTER — Other Ambulatory Visit: Payer: Self-pay | Admitting: Endocrinology

## 2013-11-24 ENCOUNTER — Other Ambulatory Visit: Payer: Self-pay | Admitting: Endocrinology

## 2013-12-12 ENCOUNTER — Other Ambulatory Visit: Payer: Self-pay

## 2013-12-12 ENCOUNTER — Other Ambulatory Visit: Payer: Self-pay | Admitting: Endocrinology

## 2013-12-16 ENCOUNTER — Telehealth: Payer: Self-pay | Admitting: Gastroenterology

## 2013-12-16 DIAGNOSIS — R197 Diarrhea, unspecified: Secondary | ICD-10-CM

## 2013-12-16 MED ORDER — DOXYCYCLINE HYCLATE 50 MG PO CAPS: 100.0000 mg | ORAL_CAPSULE | Freq: Two times a day (BID) | ORAL | Status: DC

## 2013-12-16 NOTE — Telephone Encounter (Signed)
Medication sent to pharmacy called pt to inform

## 2013-12-22 ENCOUNTER — Telehealth: Payer: Self-pay | Admitting: Endocrinology

## 2013-12-22 ENCOUNTER — Telehealth: Payer: Self-pay | Admitting: *Deleted

## 2013-12-22 MED ORDER — GLUCOSE BLOOD VI STRP: ORAL_STRIP | Status: DC

## 2013-12-22 NOTE — Telephone Encounter (Signed)
Rx sent again per pt's request.

## 2013-12-22 NOTE — Addendum Note (Signed)
Addended by: Moody Bruins E on: 12/22/2013 11:49 AM   Modules accepted: Orders

## 2013-12-22 NOTE — Telephone Encounter (Signed)
Pt advised the rx was sent to pharmacy on 12/12/2013. Pt states she will call pharmacy to check and see if its ready for pick up.

## 2013-12-22 NOTE — Telephone Encounter (Signed)
Been trying to get medication refill for x 7 days test strips and both type of insulin, has contacted Pharmacy she is really worried she want have enough medication [facility]

## 2013-12-22 NOTE — Telephone Encounter (Signed)
Patient wanted to leave Jinny Blossom a message stating that the pharmacy received the insulin script but not the test strips  Please send a rx for test strips  Pharmacy [facility] [address] :)

## 2014-01-15 ENCOUNTER — Telehealth: Payer: Self-pay | Admitting: Endocrinology

## 2014-01-15 MED ORDER — INSULIN ASPART 100 UNIT/ML FLEXPEN: PEN_INJECTOR | SUBCUTANEOUS | Status: DC

## 2014-01-15 NOTE — Telephone Encounter (Signed)
Patient would like to have her insulin filled now that she had scheduled an appt   Please advise   Thank You

## 2014-01-15 NOTE — Telephone Encounter (Signed)
Rx refilled.

## 2014-01-27 ENCOUNTER — Encounter: Payer: Self-pay | Admitting: Endocrinology

## 2014-01-27 ENCOUNTER — Ambulatory Visit (INDEPENDENT_AMBULATORY_CARE_PROVIDER_SITE_OTHER): Payer: Medicaid Other | Admitting: Endocrinology

## 2014-01-27 VITALS — BP 128/70 | HR 88 | Temp 98.7°F | Ht 65.0 in | Wt 130.0 lb

## 2014-01-27 DIAGNOSIS — E1049 Type 1 diabetes mellitus with other diabetic neurological complication: Secondary | ICD-10-CM | POA: Insufficient documentation

## 2014-01-27 DIAGNOSIS — IMO0002 Reserved for concepts with insufficient information to code with codable children: Secondary | ICD-10-CM | POA: Insufficient documentation

## 2014-01-27 DIAGNOSIS — E1065 Type 1 diabetes mellitus with hyperglycemia: Principal | ICD-10-CM

## 2014-01-27 DIAGNOSIS — E108 Type 1 diabetes mellitus with unspecified complications: Secondary | ICD-10-CM | POA: Insufficient documentation

## 2014-01-27 LAB — MICROALBUMIN / CREATININE URINE RATIO
CREATININE, U: 7.3 mg/dL
MICROALB/CREAT RATIO: 8.2 mg/g (ref 0.0–30.0)
Microalb, Ur: 0.6 mg/dL (ref 0.0–1.9)

## 2014-01-27 LAB — BASIC METABOLIC PANEL
BUN: 6 mg/dL (ref 6–23)
CO2: 27 mEq/L (ref 19–32)
Calcium: 9.5 mg/dL (ref 8.4–10.5)
Chloride: 99 mEq/L (ref 96–112)
Creatinine, Ser: 0.6 mg/dL (ref 0.4–1.2)
GFR: 126.31 mL/min (ref >60.00)
GLUCOSE: 205 mg/dL — AB (ref 70–99)
POTASSIUM: 3.4 meq/L — AB (ref 3.5–5.1)
SODIUM: 133 meq/L — AB (ref 135–145)

## 2014-01-27 LAB — HEMOGLOBIN A1C: HEMOGLOBIN A1C: 8.9 % — AB (ref 4.6–6.5)

## 2014-01-27 LAB — TSH: TSH: 0.41 u[IU]/mL (ref 0.35–4.50)

## 2014-01-27 MED ORDER — INSULIN ASPART 100 UNIT/ML FLEXPEN: PEN_INJECTOR | SUBCUTANEOUS | Status: DC

## 2014-01-27 MED ORDER — ACCU-CHEK AVIVA PLUS W/DEVICE KIT: 1.0000 | PACK | Freq: Once | Status: DC

## 2014-01-27 MED ORDER — INSULIN GLARGINE 100 UNIT/ML SOLOSTAR PEN: 5.0000 [IU] | PEN_INJECTOR | Freq: Every day | SUBCUTANEOUS | Status: DC

## 2014-01-27 NOTE — Progress Notes (Signed)
Subjective:    Patient ID: [name], female    DOB: October 01, 1975, 38 y.o.   MRN: [id number]  HPI Pt returns for f/u of type 1 DM (dx'ed 1989; she has moderate neuropathy of the lower extremities, and assoc retinopathy; therapy has been limited by noncompliance; she has never had pancreatitis, severe hypoglycemia, or DKA).  no cbg record, but she says control is poor, as she is not keeping up with it well. she says cbg's are highest in am (300's).  no cbg record, but states cbg's vary from 68-230.  There is no trend throughout the day, but it is lowest with exercise.  Pt says she never misses the insulin altogether.   Past Medical History  Diagnosis Date  . DIABETES MELLITUS, TYPE I 02/02/2007  . Anxiety state, unspecified 03/26/2009  . DEPRESSION 02/16/2009  . ASTHMA 02/02/2007  . Shortness of breath 09/25/2007  . GERD 12/21/2008  . Endometriosis     Past Surgical History  Procedure Laterality Date  . Abdominal hysterectomy  2009    BSO    History   Social History  . Marital Status: Legally Separated    Spouse Name: N/A    Number of Children: 2  . Years of Education: N/A   Occupational History  .     Social History Main Topics  . Smoking status: Current Every Day Smoker -- 1.00 packs/day for 20 years    Types: Cigarettes  . Smokeless tobacco: Never Used  . Alcohol Use: No  . Drug Use: No  . Sexual Activity: Not on file   Other Topics Concern  . Not on file   Social History Narrative   2 children--pt has custody. Husband pays child support.   Daily Caffeine Use-3 2 liters a day   Pt does not get regular exercise.    Current Outpatient Prescriptions on File Prior to Visit  Medication Sig Dispense Refill  . albuterol (PROVENTIL HFA) 108 (90 BASE) MCG/ACT inhaler Inhale 2 puffs into the lungs every 6 (six) hours as needed. For shortness of breath      . atorvastatin (LIPITOR) 10 MG tablet Take 10 mg by mouth at bedtime.      Marland Kitchen doxycycline (VIBRAMYCIN) 50 MG  capsule Take 2 capsules (100 mg total) by mouth 2 (two) times daily.  30 capsule  3  . glucose blood (ACCU-CHEK AVIVA PLUS) test strip USE ONE STRIP TO TEST BLOOD SUGAR 4 TIMES DAILY  125 each  0  . Insulin Pen Needle 31G X 8 MM MISC Use as directed       . omeprazole (PRILOSEC) 20 MG capsule Take 20 mg by mouth 2 (two) times daily.      Marland Kitchen PARoxetine (PAXIL) 40 MG tablet Take 1 tablet (40 mg total) by mouth daily.  30 tablet  0  . rOPINIRole (REQUIP) 0.5 MG tablet TAKE THREE TABLETS BY MOUTH AT BEDTIME  90 tablet  1  . Fluticasone-Salmeterol (ADVAIR DISKUS) 250-50 MCG/DOSE AEPB Inhale 1 puff into the lungs 2 (two) times daily. Inhale 1 puff into the lungs 2 (two) times daily.      Marland Kitchen HYDROcodone-acetaminophen (NORCO/VICODIN) 5-325 MG per tablet 1 tablet. Take 1 tablet by mouth every 6 (six) hours as needed for Pain.      . sulfamethoxazole-trimethoprim (BACTRIM DS) 800-160 MG per tablet Take 2 tablets by mouth every 12 (twelve) hours.      . [DISCONTINUED] glycopyrrolate (ROBINUL-FORTE) 2 MG tablet Take 1-2 tablets by mouth daily  as needed for cramping and spasms  60 tablet  1   No current facility-administered medications on file prior to visit.    No Known Allergies  Family History  Problem Relation Age of Onset  . Diabetes Mother   . Diabetes Maternal Aunt   . Emphysema Maternal Aunt   . Emphysema Maternal Uncle   . Diabetes Maternal Grandfather   . Cancer Paternal Grandmother     Lung Cancer    BP 128/70  Pulse 88  Temp(Src) 98.7 F (37.1 C) (Oral)  Ht 5\' 5"  (1.651 m)  Wt 130 lb (58.968 kg)  BMI 21.63 kg/m2  SpO2 97%   Review of Systems Denies LOC and weight change.      Objective:   Physical Exam Pulses: dorsalis pedis intact bilat.   Feet: no deformity. normal color and temp.  no edema Skin:  no ulcer on the feet.   Neuro: sensation is intact to touch on the feet, but decreased from normal.     Lab Results  Component Value Date   HGBA1C 8.9* 01/27/2014        Assessment & Plan:  DM: moderate exacerbation Noncompliance with cbg recording and f/u appts.: I'll work around this as best I can.     Patient is advised the following: Patient Instructions  blood and urine tests are being requested for you today.  We'll contact you with results. check your blood sugar 4 times a day--before the 3 meals, and at bedtime.  also check if you have symptoms of your blood sugar being too high or too low.  please keep a record of the readings and bring it to your next appointment here.  please call us sooner if your blood sugar goes below 70, or if you have a lot of readings over 200. Please come back for a follow-up appointment in 1 month.   Please reduce the novolog by 2 units if you are going to be active.

## 2014-01-27 NOTE — Patient Instructions (Addendum)
blood and urine tests are being requested for you today.  We'll contact you with results. check your blood sugar 4 times a day--before the 3 meals, and at bedtime.  also check if you have symptoms of your blood sugar being too high or too low.  please keep a record of the readings and bring it to your next appointment here.  please call us sooner if your blood sugar goes below 70, or if you have a lot of readings over 200. Please come back for a follow-up appointment in 1 month.   Please reduce the novolog by 2 units if you are going to be active.

## 2014-02-02 ENCOUNTER — Ambulatory Visit: Payer: Medicaid Other | Admitting: Gastroenterology

## 2014-02-08 ENCOUNTER — Emergency Department: Payer: Self-pay | Admitting: Emergency Medicine

## 2014-02-08 LAB — URINALYSIS, COMPLETE
BILIRUBIN, UR: NEGATIVE
Bacteria: NONE SEEN
Glucose,UR: NEGATIVE mg/dL (ref 0–75)
Ketone: NEGATIVE
Leukocyte Esterase: NEGATIVE
Nitrite: NEGATIVE
PH: 6 (ref 4.5–8.0)
PROTEIN: NEGATIVE
RBC,UR: <1 /HPF (ref 0–5)
SQUAMOUS EPITHELIAL: NONE SEEN
Specific Gravity: 1.002 (ref 1.003–1.030)
WBC UR: NONE SEEN /HPF (ref 0–5)

## 2014-02-08 LAB — COMPREHENSIVE METABOLIC PANEL
ANION GAP: 7 (ref 7–16)
AST: 27 U/L (ref 15–37)
Albumin: 3.9 g/dL (ref 3.4–5.0)
Alkaline Phosphatase: 77 U/L
BILIRUBIN TOTAL: 0.2 mg/dL (ref 0.2–1.0)
BUN: 8 mg/dL (ref 7–18)
Calcium, Total: 8.9 mg/dL (ref 8.5–10.1)
Chloride: 95 mmol/L — ABNORMAL LOW (ref 98–107)
Co2: 25 mmol/L (ref 21–32)
Creatinine: 0.67 mg/dL (ref 0.60–1.30)
EGFR (African American): >60
EGFR (Non-African Amer.): >60
Glucose: 131 mg/dL — ABNORMAL HIGH (ref 65–99)
OSMOLALITY: 255 (ref 275–301)
Potassium: 3.2 mmol/L — ABNORMAL LOW (ref 3.5–5.1)
SGPT (ALT): 21 U/L
SODIUM: 127 mmol/L — AB (ref 136–145)
Total Protein: 7.3 g/dL (ref 6.4–8.2)

## 2014-02-08 LAB — CBC WITH DIFFERENTIAL/PLATELET
Basophil #: 0.1 10*3/uL (ref 0.0–0.1)
Basophil %: 0.4 %
Eosinophil #: 0.1 10*3/uL (ref 0.0–0.7)
Eosinophil %: 0.7 %
HCT: 41 % (ref 35.0–47.0)
HGB: 13.9 g/dL (ref 12.0–16.0)
LYMPHS PCT: 14.8 %
Lymphocyte #: 2 10*3/uL (ref 1.0–3.6)
MCH: 31.2 pg (ref 26.0–34.0)
MCHC: 33.9 g/dL (ref 32.0–36.0)
MCV: 92 fL (ref 80–100)
MONO ABS: 0.8 x10 3/mm (ref 0.2–0.9)
Monocyte %: 6.1 %
Neutrophil #: 10.5 10*3/uL — ABNORMAL HIGH (ref 1.4–6.5)
Neutrophil %: 78 %
Platelet: 181 10*3/uL (ref 150–440)
RBC: 4.45 10*6/uL (ref 3.80–5.20)
RDW: 13.2 % (ref 11.5–14.5)
WBC: 13.5 10*3/uL — AB (ref 3.6–11.0)

## 2014-02-08 LAB — LIPASE, BLOOD: LIPASE: 218 U/L (ref 73–393)

## 2014-02-16 ENCOUNTER — Other Ambulatory Visit: Payer: Self-pay

## 2014-02-16 MED ORDER — GLUCOSE BLOOD VI STRP: ORAL_STRIP | Status: DC

## 2014-02-17 ENCOUNTER — Telehealth: Payer: Self-pay | Admitting: Endocrinology

## 2014-02-17 NOTE — Telephone Encounter (Signed)
Pt requesting for Dr. [name] to rx Gabapentin for her. He has never rx it before and he is out of town. I let her know the covering MD does not rx pain meds and would not be able to without seeing her. I asked if she could get her PCP-the original rx of the med to rx it for her-she states then that she had been discharged from that practice due to 3 no shows in one year.

## 2014-02-18 ENCOUNTER — Other Ambulatory Visit: Payer: Self-pay

## 2014-02-18 DIAGNOSIS — Z0000 Encounter for general adult medical examination without abnormal findings: Secondary | ICD-10-CM

## 2014-02-27 ENCOUNTER — Encounter: Payer: Self-pay | Admitting: Endocrinology

## 2014-02-27 ENCOUNTER — Ambulatory Visit (INDEPENDENT_AMBULATORY_CARE_PROVIDER_SITE_OTHER): Payer: Medicaid Other | Admitting: Endocrinology

## 2014-02-27 VITALS — BP 116/62 | HR 113 | Temp 98.7°F | Ht 65.0 in | Wt 128.0 lb

## 2014-02-27 DIAGNOSIS — E1065 Type 1 diabetes mellitus with hyperglycemia: Principal | ICD-10-CM

## 2014-02-27 DIAGNOSIS — E1049 Type 1 diabetes mellitus with other diabetic neurological complication: Secondary | ICD-10-CM

## 2014-02-27 NOTE — Patient Instructions (Addendum)
check your blood sugar 4 times a day--before the 3 meals, and at bedtime.  also check if you have symptoms of your blood sugar being too high or too low.  please keep a record of the readings and bring it to your next appointment here.  please call us sooner if your blood sugar goes below 70, or if you have a lot of readings over 200. Please continue the same insulins.   Please come back for a follow-up appointment in 2 months.   Please reduce the novolog by 2 units if you are going to be active.  If you are unable to anticipate the activity, eat a few crackers with it.   Please do you best to exercise, despite the neuropathy.

## 2014-02-27 NOTE — Progress Notes (Signed)
Subjective:    Patient ID: [name], female    DOB: [birth year], 38 y.o.   MRN: [id number]  HPI Pt returns for f/u of type 1 DM (dx'ed 1989, when she presented with severe hyperglycemia; she has moderate neuropathy of the lower extremities, and assoc retinopathy; therapy has been limited by noncompliance; she has never had pancreatitis, severe hypoglycemia, or DKA; she has been on insulin since dx).  no cbg record, but she says control is poor, as she is not keeping up with it well. she says cbg's are highest in am (300's).  Pt says she never misses the insulin altogether.  she brings a record of her cbg's which i have reviewed today.  It varies from 69-300, but most are in the high-100's.  There is no trend throughout the day, but it is lower with activity, which she is unable to anticipate.  Pt says the neuropathy limits her activity. Past Medical History  Diagnosis Date  . DIABETES MELLITUS, TYPE I 02/02/2007  . Anxiety state, unspecified 03/26/2009  . DEPRESSION 02/16/2009  . ASTHMA 02/02/2007  . Shortness of breath 09/25/2007  . GERD 12/21/2008  . Endometriosis     Past Surgical History  Procedure Laterality Date  . Abdominal hysterectomy  2009    BSO    History   Social History  . Marital Status: Legally Separated    Spouse Name: N/A    Number of Children: 2  . Years of Education: N/A   Occupational History  .     Social History Main Topics  . Smoking status: Current Every Day Smoker -- 1.00 packs/day for 20 years    Types: Cigarettes  . Smokeless tobacco: Never Used  . Alcohol Use: No  . Drug Use: No  . Sexual Activity: Not on file   Other Topics Concern  . Not on file   Social History Narrative   2 children--pt has custody. Husband pays child support.   Daily Caffeine Use-3 2 liters a day   Pt does not get regular exercise.    Current Outpatient Prescriptions on File Prior to Visit  Medication Sig Dispense Refill  . albuterol (PROVENTIL HFA) 108 (90  BASE) MCG/ACT inhaler Inhale 2 puffs into the lungs every 6 (six) hours as needed. For shortness of breath      . atorvastatin (LIPITOR) 10 MG tablet Take 10 mg by mouth at bedtime.      . Blood Glucose Monitoring Suppl (ACCU-CHEK AVIVA PLUS) W/DEVICE KIT 1 Device by Does not apply route once.  1 kit  0  . doxycycline (VIBRAMYCIN) 50 MG capsule Take 2 capsules (100 mg total) by mouth 2 (two) times daily.  30 capsule  3  . gabapentin (NEURONTIN) 300 MG capsule 1 po QHS; may increase to 2 po QHS after 1 week if night sweats persist      . glucose blood (ACCU-CHEK AVIVA PLUS) test strip USE ONE STRIP TO TEST BLOOD SUGAR 4 TIMES DAILY  125 each  3  . HYDROcodone-acetaminophen (NORCO/VICODIN) 5-325 MG per tablet 1 tablet. Take 1 tablet by mouth every 6 (six) hours as needed for Pain.      Marland Kitchen ibuprofen (ADVIL,MOTRIN) 800 MG tablet Take 800 mg by mouth.      . insulin aspart (NOVOLOG FLEXPEN) 100 UNIT/ML FlexPen 3 times a day (just before each meal) 12-5-6 units, and pen needles 4/day  15 mL  11  . Insulin Glargine (LANTUS SOLOSTAR) 100 UNIT/ML Solostar Pen Inject 5  Units into the skin at bedtime.  5 pen  PRN  . Insulin Pen Needle 31G X 8 MM MISC Use as directed       . nicotine (NICODERM CQ - DOSED IN MG/24 HOURS) 21 mg/24hr patch Place 1 patch onto the skin.      Marland Kitchen omeprazole (PRILOSEC) 20 MG capsule Take 20 mg by mouth 2 (two) times daily.      Marland Kitchen PARoxetine (PAXIL) 40 MG tablet Take 1 tablet (40 mg total) by mouth daily.  30 tablet  0  . rOPINIRole (REQUIP) 0.5 MG tablet TAKE THREE TABLETS BY MOUTH AT BEDTIME  90 tablet  1  . sulfamethoxazole-trimethoprim (BACTRIM DS) 800-160 MG per tablet Take 2 tablets by mouth every 12 (twelve) hours.      . Fluticasone-Salmeterol (ADVAIR DISKUS) 250-50 MCG/DOSE AEPB Inhale 1 puff into the lungs 2 (two) times daily. Inhale 1 puff into the lungs 2 (two) times daily.      . [DISCONTINUED] glycopyrrolate (ROBINUL-FORTE) 2 MG tablet Take 1-2 tablets by mouth daily as  needed for cramping and spasms  60 tablet  1   No current facility-administered medications on file prior to visit.    No Known Allergies  Family History  Problem Relation Age of Onset  . Diabetes Mother   . Diabetes Maternal Aunt   . Emphysema Maternal Aunt   . Emphysema Maternal Uncle   . Diabetes Maternal Grandfather   . Cancer Paternal Grandmother     Lung Cancer    BP 116/62  Pulse 113  Temp(Src) 98.7 F (37.1 C) (Oral)  Ht _0  (1.651 m)  Wt 128 lb (58.06 kg)  BMI 21.30 kg/m2  SpO2 95%    Review of Systems Denies LOC and weight change.      Objective:   Physical Exam Pulses: dorsalis pedis intact bilat.   Feet: no deformity. normal color and temp.  no edema.   Skin:  no ulcer on the feet.   Neuro: sensation is intact to touch on the feet.   Lab Results  Component Value Date   HGBA1C 8.9* 01/27/2014      Assessment & Plan:  DM: apparently improved. Neuropathy: this limits exercise rx of DM. Side-effect of Rx: hypoglycemia, mild.  Patient is advised the following: Patient Instructions  check your blood sugar 4 times a day--before the 3 meals, and at bedtime.  also check if you have symptoms of your blood sugar being too high or too low.  please keep a record of the readings and bring it to your next appointment here.  please call us sooner if your blood sugar goes below 70, or if you have a lot of readings over 200. Please continue the same insulins.   Please come back for a follow-up appointment in 2 months.   Please reduce the novolog by 2 units if you are going to be active.  If you are unable to anticipate the activity, eat a few crackers with it.   Please do you best to exercise, despite the neuropathy.

## 2014-03-30 ENCOUNTER — Telehealth: Payer: Self-pay | Admitting: Gastroenterology

## 2014-04-01 NOTE — Telephone Encounter (Signed)
She takes Doxycycline for diarrhea. It was refilled in June, but she was a no show for the appointment 02/02/14. She was last seen 11/11/12. Please advise.

## 2014-04-02 ENCOUNTER — Other Ambulatory Visit: Payer: Self-pay

## 2014-04-02 DIAGNOSIS — R197 Diarrhea, unspecified: Secondary | ICD-10-CM

## 2014-04-02 MED ORDER — DOXYCYCLINE HYCLATE 50 MG PO CAPS: 100.0000 mg | ORAL_CAPSULE | Freq: Two times a day (BID) | ORAL | Status: DC

## 2014-04-02 NOTE — Telephone Encounter (Signed)
Okay to renew that please send her a note is that have to see her before the next renewal

## 2014-04-02 NOTE — Telephone Encounter (Signed)
Refill done. Spoke with the patient. She is advised to keep the November appointment and to ask for further refills at that time.

## 2014-04-29 ENCOUNTER — Ambulatory Visit: Payer: Medicaid Other | Admitting: Endocrinology

## 2014-05-01 LAB — HM DIABETES EYE EXAM

## 2014-05-26 ENCOUNTER — Ambulatory Visit: Payer: Medicaid Other | Admitting: Gastroenterology

## 2014-06-04 ENCOUNTER — Telehealth: Payer: Self-pay | Admitting: Gastroenterology

## 2014-06-04 ENCOUNTER — Encounter: Payer: Self-pay | Admitting: Gastroenterology

## 2014-06-04 NOTE — Telephone Encounter (Signed)
Patient has dx of chronic diarrhea. She has not been seen in over a year. Offered an appointment today but she is unable to come in. She agrees to an appointment tomorrow.

## 2014-06-05 ENCOUNTER — Ambulatory Visit (INDEPENDENT_AMBULATORY_CARE_PROVIDER_SITE_OTHER): Payer: Medicaid Other | Admitting: Physician Assistant

## 2014-06-05 ENCOUNTER — Encounter: Payer: Self-pay | Admitting: Physician Assistant

## 2014-06-05 ENCOUNTER — Other Ambulatory Visit (INDEPENDENT_AMBULATORY_CARE_PROVIDER_SITE_OTHER): Payer: Medicaid Other

## 2014-06-05 VITALS — BP 130/64 | HR 88 | Ht 65.0 in | Wt 136.4 lb

## 2014-06-05 DIAGNOSIS — K6389 Other specified diseases of intestine: Secondary | ICD-10-CM

## 2014-06-05 DIAGNOSIS — K529 Noninfective gastroenteritis and colitis, unspecified: Secondary | ICD-10-CM

## 2014-06-05 DIAGNOSIS — R110 Nausea: Secondary | ICD-10-CM

## 2014-06-05 LAB — BASIC METABOLIC PANEL
BUN: 7 mg/dL (ref 6–23)
CHLORIDE: 100 meq/L (ref 96–112)
CO2: 26 meq/L (ref 19–32)
Calcium: 9.4 mg/dL (ref 8.4–10.5)
Creatinine, Ser: 0.6 mg/dL (ref 0.4–1.2)
GFR: 118.82 mL/min (ref >60.00)
Glucose, Bld: 160 mg/dL — ABNORMAL HIGH (ref 70–99)
Potassium: 3.7 mEq/L (ref 3.5–5.1)
SODIUM: 134 meq/L — AB (ref 135–145)

## 2014-06-05 LAB — CBC WITH DIFFERENTIAL/PLATELET
Basophils Absolute: 0 10*3/uL (ref 0.0–0.1)
Basophils Relative: 0.5 % (ref 0.0–3.0)
Eosinophils Absolute: 0 10*3/uL (ref 0.0–0.7)
Eosinophils Relative: 0.5 % (ref 0.0–5.0)
HCT: 42.6 % (ref 36.0–46.0)
Hemoglobin: 14.4 g/dL (ref 12.0–15.0)
LYMPHS PCT: 23.4 % (ref 12.0–46.0)
Lymphs Abs: 2 10*3/uL (ref 0.7–4.0)
MCHC: 33.8 g/dL (ref 30.0–36.0)
MCV: 91.1 fl (ref 78.0–100.0)
MONOS PCT: 7.4 % (ref 3.0–12.0)
Monocytes Absolute: 0.6 10*3/uL (ref 0.1–1.0)
NEUTROS PCT: 68.2 % (ref 43.0–77.0)
Neutro Abs: 5.7 10*3/uL (ref 1.4–7.7)
PLATELETS: 226 10*3/uL (ref 150.0–400.0)
RBC: 4.68 Mil/uL (ref 3.87–5.11)
RDW: 12.9 % (ref 11.5–15.5)
WBC: 8.3 10*3/uL (ref 4.0–10.5)

## 2014-06-05 MED ORDER — GLYCOPYRROLATE 2 MG PO TABS: 2.0000 mg | ORAL_TABLET | Freq: Two times a day (BID) | ORAL | Status: DC

## 2014-06-05 NOTE — Patient Instructions (Addendum)
You have been given a separate informational sheet regarding your tobacco use, the importance of quitting and local resources to help you quit. Please go to the basement level to have your labs drawn and stool study. We sent a prescription for  Robinul Forte 2 mg to  [facility]. .  We are sending a prescription order to [facility].  They will call Medicaid and try to get approval for the drug.  If they get approval they will call you to make sure someone is home for when they deliver the medication.

## 2014-06-05 NOTE — Progress Notes (Signed)
Patient ID: [name], female   DOB: Jul 04, 1976, 38 y.o.   MRN: [id number]   Subjective:    Patient ID: [name], female    DOB: 1976-01-21, 38 y.o.   MRN: [id number]  HPI [name] is a pleasant 38 year old white female known to Dr. Deatra Ina who has not been seen over the past couple of years. 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 She has been a smoker. From a GI standpoint she had been seen for chronic diarrhea which is felt to be secondary to small bowel bacterial overgrowth and she has in the past responded to both Flagyl and doxycycline. She had EGD in 2009 which did show mild esophagitis and colonoscopy in April 2009 was negative with the exception of internal hemorrhoids. Patient says she has had problems with diarrhea for years. She says that doxycycline use to help quite a bit and that her diarrhea would completely resolved for several months. She says the last couple of times she had taken doxycycline it didn't seem to help as much. She has not had any over the past 6-8 months. He comes in today stating that she is having bowel movements after anything that she eats including small snacks and that her stools are watery to liquid in consistency and sometimes oily. She also complains of increased gas .She has not noted any blood. She does have some urgency and mild cramping and she's also been generally feeling poorly over the past 6 months with decrease in energy level and some vague nausea without vomiting. Her weight has been stable. Most recent hemoglobin A1c was 8.2. She has not required any antibiotics for any other medical problems over the past several months. Previous inflammatory markers have been negative celiac workup was negative in 2013.  Review of Systems Pertinent positive and negative review of systems were noted in the above HPI section.  All other review of systems was  otherwise negative.  Outpatient Encounter Prescriptions as of 06/05/2014  Medication Sig  . albuterol (PROVENTIL HFA) 108 (90 BASE) MCG/ACT inhaler Inhale 2 puffs into the lungs every 6 (six) hours as needed. For shortness of breath  . atorvastatin (LIPITOR) 10 MG tablet Take 10 mg by mouth at bedtime.  . Blood Glucose Monitoring Suppl (ACCU-CHEK AVIVA PLUS) W/DEVICE KIT 1 Device by Does not apply route once.  . doxycycline (VIBRAMYCIN) 50 MG capsule Take 2 capsules (100 mg total) by mouth 2 (two) times daily.  Marland Kitchen gabapentin (NEURONTIN) 300 MG capsule 1 po QHS; may increase to 2 po QHS after 1 week if night sweats persist  . glucose blood (ACCU-CHEK AVIVA PLUS) test strip USE ONE STRIP TO TEST BLOOD SUGAR 4 TIMES DAILY  . ibuprofen (ADVIL,MOTRIN) 800 MG tablet Take 800 mg by mouth.  . insulin aspart (NOVOLOG FLEXPEN) 100 UNIT/ML FlexPen 3 times a day (just before each meal) 12-5-6 units, and pen needles 4/day  . Insulin Glargine (LANTUS SOLOSTAR) 100 UNIT/ML Solostar Pen Inject 5 Units into the skin at bedtime.  . Insulin Pen Needle 31G X 8 MM MISC Use as directed   . nicotine (NICODERM CQ - DOSED IN MG/24 HOURS) 21 mg/24hr patch Place 1 patch onto the skin.  Marland Kitchen omeprazole (PRILOSEC) 20 MG capsule Take 20 mg by mouth 2 (two) times daily.  Marland Kitchen PARoxetine (PAXIL) 40 MG tablet Take 1 tablet (40 mg total) by mouth daily.  Marland Kitchen rOPINIRole (REQUIP) 0.5 MG tablet TAKE THREE TABLETS  BY MOUTH AT BEDTIME  . sulfamethoxazole-trimethoprim (BACTRIM DS) 800-160 MG per tablet Take 2 tablets by mouth every 12 (twelve) hours.  . Fluticasone-Salmeterol (ADVAIR DISKUS) 250-50 MCG/DOSE AEPB Inhale 1 puff into the lungs 2 (two) times daily. Inhale 1 puff into the lungs 2 (two) times daily.  Marland Kitchen glycopyrrolate (ROBINUL) 2 MG tablet Take 1 tablet (2 mg total) by mouth 2 (two) times daily.  . [DISCONTINUED] HYDROcodone-acetaminophen (NORCO/VICODIN) 5-325 MG per tablet 1 tablet. Take 1 tablet by mouth every 6 (six) hours as  needed for Pain.   No Known Allergies Patient Active Problem List   Diagnosis Date Noted  . Type I (juvenile type) diabetes mellitus with neurological manifestations, uncontrolled 01/27/2014  . Hematuria 03/26/2012  . Myalgia 11/10/2010  . AIRWAY INFLAMMATION 06/08/2010  . Nonspecific (abnormal) findings on radiological and other examination of body structure 05/12/2010  . [place] LUNG FIELD 05/12/2010  . VULVAR ABSCESS 03/18/2010  . UTI 03/09/2010  . INSOMNIA 02/18/2010  . FOLLICULITIS 25/63/8937  . ANXIETY STATE, UNSPECIFIED 03/26/2009  . NAUSEA 02/19/2009  . DEPRESSION 02/16/2009  . GERD 12/21/2008  . ASTHMATIC BRONCHITIS, ACUTE 08/12/2008  . ACUTE MAXILLARY SINUSITIS 05/04/2008  . MOLE 01/29/2008  . Diarrhea 10/16/2007  . SMOKER 09/25/2007  . SHORTNESS OF BREATH 09/25/2007  . ASTHMA 02/02/2007   History   Social History  . Marital Status: Legally Separated    Spouse Name: N/A    Number of Children: 2  . Years of Education: N/A   Occupational History  .     Social History Main Topics  . Smoking status: Current Every Day Smoker -- 1.00 packs/day for 20 years    Types: Cigarettes  . Smokeless tobacco: Never Used     Comment: form given 06/05/14  . Alcohol Use: No  . Drug Use: No  . Sexual Activity: Not on file   Other Topics Concern  . Not on file   Social History Narrative   2 children--pt has custody. Husband pays child support.   Daily Caffeine Use-3 2 liters a day   Pt does not get regular exercise.    [name] family history includes Diabetes in her maternal aunt, maternal grandfather, and mother; Emphysema in her maternal aunt and maternal uncle; Lung cancer in her maternal aunt. There is no history of Colon cancer, Kidney disease, or Liver disease.      Objective:    Filed Vitals:   06/05/14 1400  BP: 130/64  Pulse: 88    Physical Exam well-developed young white female in no acute distress, pleasant blood  pressure 130/64 pulse 88 height 5 foot 5 weight 136. HEENT; nontraumatic normocephalic EOMI PERRLA sclerae anicteric, Supple; no JVD, Cardiovascula;r; regular rate and rhythm with S1-S2 no murmur rub or gallop, Pulmonary; clear bilaterally, Abdomen; soft nontender nondistended bowel sounds are active there is no palpable mass or hepatosplenomegaly, Rectal; exam not done, Extremities; no clubbing cyanosis or edema skin warm and dry, Psych ;mood and affect appropriate     Assessment & Plan:   #12 38 year old female insulin-dependent diabetic for many years with chronic diarrhea worsened over the past 6 months and responsive in the past to treatment for bacterial overgrowth with both doxycycline and metronidazole. Suspect her symptoms are secondary to a combination of bacterial overgrowth and perhaps diabetic visceral neuropathy, also consider pancreatic insufficiency #2 GERD #3 anxiety and depression #4  Asthma  Plan; CBC with differential, BMET, stool for fat qualitative and fecal elastase Start  Robinul Forte 2 mg by mouth twice daily Will give her a course of Xifaxan 550 twice daily 10 days Continue daily probiotic Follow-up office visit in 3-4 weeks with Dr. Deatra Ina or myself-if no response to the above regimen may give her a trial of pancreatic enzyme supplementation.   Amy Genia Harold PA-C 06/05/2014

## 2014-06-08 ENCOUNTER — Other Ambulatory Visit: Payer: Self-pay | Admitting: Physician Assistant

## 2014-06-08 ENCOUNTER — Other Ambulatory Visit: Payer: Medicaid Other

## 2014-06-08 DIAGNOSIS — K529 Noninfective gastroenteritis and colitis, unspecified: Secondary | ICD-10-CM

## 2014-06-08 DIAGNOSIS — R110 Nausea: Secondary | ICD-10-CM

## 2014-06-08 NOTE — Progress Notes (Signed)
Reviewed and agree with management. Robert D. Kaplan, M.D., FACG  

## 2014-06-10 ENCOUNTER — Ambulatory Visit: Payer: Medicaid Other | Admitting: Gastroenterology

## 2014-06-12 LAB — FECAL FAT, QUALITATIVE: FECAL FAT QUALITATIVE: ABNORMAL — AB

## 2014-06-15 ENCOUNTER — Telehealth: Payer: Self-pay | Admitting: *Deleted

## 2014-06-15 NOTE — Telephone Encounter (Signed)
Yes, can give her a course of flagyl 250 mg tid with food x 10 days- thanks

## 2014-06-15 NOTE — Telephone Encounter (Signed)
[name],   [facility] called, stated patient has Medicaid and Medicaid will not pay for Xifaxan  Per Medicaid they will cover Flagyl  Do you want me to send Flagy?  Thank you

## 2014-06-16 MED ORDER — METRONIDAZOLE 250 MG PO TABS: 250.0000 mg | ORAL_TABLET | Freq: Three times a day (TID) | ORAL | Status: DC

## 2014-06-16 NOTE — Telephone Encounter (Signed)
Medication sent [facility] for patient to call back

## 2014-06-19 ENCOUNTER — Telehealth: Payer: Self-pay | Admitting: Gastroenterology

## 2014-06-19 ENCOUNTER — Other Ambulatory Visit: Payer: Self-pay

## 2014-06-19 MED ORDER — RIFAXIMIN 550 MG PO TABS: 550.0000 mg | ORAL_TABLET | Freq: Two times a day (BID) | ORAL | Status: DC

## 2014-06-19 NOTE — Telephone Encounter (Signed)
Patient is willing to cover the cost of Xifaxin out of pocket. She still has diarrhea and it is especially bad if she eats high fat foods. I have escribed the Xifaxin Rx. She will call me if the diarrhea continues.

## 2014-06-22 NOTE — Telephone Encounter (Signed)
Ok, sounds good, thanks!

## 2014-06-23 ENCOUNTER — Telehealth: Payer: Self-pay | Admitting: Gastroenterology

## 2014-06-23 NOTE — Telephone Encounter (Signed)
This is a Deatra Ina pt, Eustaquio Maize his now his nurse.

## 2014-06-23 NOTE — Telephone Encounter (Signed)
Caller name: Maudie Mercury Call back [phone]  Reason for call:  Pt is calling back regarding this message:  Please see below.

## 2014-06-24 LAB — PANCREATIC ELASTASE, FECAL: Pancreatic Elastase-1, Stool: >500

## 2014-06-24 NOTE — Telephone Encounter (Signed)
PA done on the Xifaxin using Dx K63.89 and R19.7. Patient is aware this may take 2 to 5 days to resolve.

## 2014-06-30 NOTE — Telephone Encounter (Signed)
Xifaxin approved by [employer] Pharmacy will fill the prescription. Called to patient's home and advised her the medication has a$3 co-pay.

## 2014-07-16 ENCOUNTER — Telehealth: Payer: Self-pay | Admitting: Gastroenterology

## 2014-07-16 NOTE — Telephone Encounter (Signed)
I have left message for the patient to call back 

## 2014-07-27 NOTE — Telephone Encounter (Signed)
I have left message for the patient to call back 

## 2014-07-31 ENCOUNTER — Other Ambulatory Visit: Payer: Self-pay

## 2014-07-31 NOTE — Telephone Encounter (Signed)
Letter mailed to patient.

## 2014-09-07 ENCOUNTER — Telehealth: Payer: Self-pay | Admitting: Gastroenterology

## 2014-09-08 MED ORDER — PANCRELIPASE (LIP-PROT-AMYL) 36000-114000 UNITS PO CPEP: 36000.0000 [IU] | ORAL_CAPSULE | Freq: Three times a day (TID) | ORAL | Status: DC

## 2014-09-08 NOTE — Telephone Encounter (Signed)
Begin Creon 36000-2 tabs with each meal DC xifaxan Office visit 3-4 weeks

## 2014-09-08 NOTE — Telephone Encounter (Signed)
Informed patient  that I sent in Creon for her per Dr Guillermina City request  And to discontinue xifaxian  Per dr Deatra Ina. She will call the office back for a follow up appointment

## 2014-09-10 ENCOUNTER — Telehealth: Payer: Self-pay | Admitting: Gastroenterology

## 2014-09-10 NOTE — Telephone Encounter (Signed)
Amy can we give this patient Flagyl again??

## 2014-09-11 ENCOUNTER — Other Ambulatory Visit: Payer: Self-pay | Admitting: *Deleted

## 2014-09-11 MED ORDER — RIFAXIMIN 550 MG PO TABS: 550.0000 mg | ORAL_TABLET | Freq: Two times a day (BID) | ORAL | Status: DC

## 2014-09-11 MED ORDER — RIFAXIMIN 550 MG PO TABS: 550.0000 mg | ORAL_TABLET | Freq: Three times a day (TID) | ORAL | Status: DC

## 2014-09-11 NOTE — Telephone Encounter (Signed)
Please send Rx for Xifaxan 550 mg 3 times daily x 14 days- may want to send thru [facility] to help get it covered

## 2014-09-11 NOTE — Telephone Encounter (Signed)
We have already submitted to [facility] they denied because patient has medicaid, Servando Snare only cost her $3 last time.... I sent in a new script for Xaifaxian for three times a day for 14 days   Pt aware to check with pharmacy to pick up

## 2014-09-15 ENCOUNTER — Other Ambulatory Visit: Payer: Self-pay | Admitting: Endocrinology

## 2014-09-23 ENCOUNTER — Other Ambulatory Visit: Payer: Self-pay | Admitting: *Deleted

## 2014-09-23 MED ORDER — GLUCOSE BLOOD VI STRP: ORAL_STRIP | Status: DC

## 2014-11-27 ENCOUNTER — Telehealth: Payer: Self-pay | Admitting: Gastroenterology

## 2014-11-27 DIAGNOSIS — R197 Diarrhea, unspecified: Secondary | ICD-10-CM

## 2014-11-27 MED ORDER — RIFAXIMIN 550 MG PO TABS: 550.0000 mg | ORAL_TABLET | Freq: Three times a day (TID) | ORAL | Status: DC

## 2014-11-27 NOTE — Telephone Encounter (Signed)
Discussed with patient about her xifaxian prescription. This was sent in for her in Feb.  Told her I would have to send in a new prescription and get a rejection from her pharmacy then I would contact ehr insurance to try and get it covered again. First time the med at twice a day did not help the patient.   When we sent in med for three times a day her insurance would not cover it.

## 2014-12-01 NOTE — Telephone Encounter (Signed)
Faxed form for PA today at 3:00pm     To [facility] [employer]

## 2014-12-09 NOTE — Telephone Encounter (Signed)
SPOKE WITH PATIENT TODAY  SHE HAS TAKEN 4 DOSES OF XIFAXIAN   AND IS FEELING BETTER BUT WILL CALL us AND LET us KNOW HOW SHE IS DOING AFTER THE 14 DAYS OF Neta Mends

## 2015-01-11 ENCOUNTER — Telehealth: Payer: Self-pay | Admitting: Endocrinology

## 2015-01-11 ENCOUNTER — Other Ambulatory Visit: Payer: Self-pay

## 2015-01-11 MED ORDER — GLUCOSE BLOOD VI STRP: ORAL_STRIP | Status: DC

## 2015-01-11 NOTE — Telephone Encounter (Signed)
Pt needs refill acu check strips  (pt leaving out of town Friday)

## 2015-01-11 NOTE — Telephone Encounter (Signed)
Rx sent for a 30 day supply. Pt needs appointment for further refills.

## 2015-02-04 ENCOUNTER — Emergency Department
Admission: EM | Admit: 2015-02-04 | Discharge: 2015-02-04 | Disposition: A | Discharge status: Home / Self Care | Payer: Medicaid Other | Attending: Emergency Medicine | Admitting: Emergency Medicine

## 2015-02-04 ENCOUNTER — Encounter: Payer: Self-pay | Admitting: Emergency Medicine

## 2015-02-04 DIAGNOSIS — H9202 Otalgia, left ear: Secondary | ICD-10-CM | POA: Diagnosis not present

## 2015-02-04 DIAGNOSIS — Z720 Tobacco use: Secondary | ICD-10-CM | POA: Diagnosis not present

## 2015-02-04 DIAGNOSIS — E109 Type 1 diabetes mellitus without complications: Secondary | ICD-10-CM | POA: Diagnosis not present

## 2015-02-04 DIAGNOSIS — Z79899 Other long term (current) drug therapy: Secondary | ICD-10-CM | POA: Insufficient documentation

## 2015-02-04 DIAGNOSIS — Z794 Long term (current) use of insulin: Secondary | ICD-10-CM | POA: Insufficient documentation

## 2015-02-04 DIAGNOSIS — L0201 Cutaneous abscess of face: Secondary | ICD-10-CM | POA: Diagnosis not present

## 2015-02-04 MED ORDER — NEOMYCIN-POLYMYXIN-HC 3.5-10000-1 OT SOLN: 3.0000 [drp] | Freq: Three times a day (TID) | OTIC | Status: AC

## 2015-02-04 MED ORDER — SULFAMETHOXAZOLE-TRIMETHOPRIM 800-160 MG PO TABS: 1.0000 | ORAL_TABLET | Freq: Two times a day (BID) | ORAL | Status: DC

## 2015-02-04 NOTE — ED Notes (Signed)
Here to have sore evaluated on forehead

## 2015-02-04 NOTE — ED Provider Notes (Signed)
[facility] Emergency Department Provider Note  ____________________________________________  Time seen:  4:39 PM  I have reviewed the triage vital signs and the nursing notes.   HISTORY  Chief Complaint Wound Check and Otalgia   HPI [name] is a 39 y.o. female is here because of infection on her forehead. She states that she had a pimple that she opened and on a daily basis continued to squeeze stuff out of it. It is now very tender, swollen and red. She also complains of left ear pain. She denies any fever, nausea or vomiting. She has not taken any over-the-counter medication for this. She denies any prior problems similar to this. She rates her pain as moderate, 7 out of 10.   Past Medical History  Diagnosis Date  . DIABETES MELLITUS, TYPE I 02/02/2007  . Anxiety state, unspecified 03/26/2009  . DEPRESSION 02/16/2009  . ASTHMA 02/02/2007  . Shortness of breath 09/25/2007  . GERD 12/21/2008  . Endometriosis     Patient Active Problem List   Diagnosis Date Noted  . Type I (juvenile type) diabetes mellitus with neurological manifestations, uncontrolled 01/27/2014  . Hematuria 03/26/2012  . Myalgia 11/10/2010  . AIRWAY INFLAMMATION 06/08/2010  . Nonspecific (abnormal) findings on radiological and other examination of body structure 05/12/2010  . [place] LUNG FIELD 05/12/2010  . VULVAR ABSCESS 03/18/2010  . UTI 03/09/2010  . INSOMNIA 02/18/2010  . FOLLICULITIS 02/54/2706  . ANXIETY STATE, UNSPECIFIED 03/26/2009  . NAUSEA 02/19/2009  . DEPRESSION 02/16/2009  . GERD 12/21/2008  . ASTHMATIC BRONCHITIS, ACUTE 08/12/2008  . ACUTE MAXILLARY SINUSITIS 05/04/2008  . MOLE 01/29/2008  . Diarrhea 10/16/2007  . SMOKER 09/25/2007  . SHORTNESS OF BREATH 09/25/2007  . ASTHMA 02/02/2007    Past Surgical History  Procedure Laterality Date  . Abdominal hysterectomy  2009    BSO    Current Outpatient Rx  Name  Route  Sig   Dispense  Refill  . albuterol (PROVENTIL HFA) 108 (90 BASE) MCG/ACT inhaler   Inhalation   Inhale 2 puffs into the lungs every 6 (six) hours as needed. For shortness of breath         . atorvastatin (LIPITOR) 10 MG tablet   Oral   Take 10 mg by mouth at bedtime.         . Blood Glucose Monitoring Suppl (ACCU-CHEK AVIVA PLUS) W/DEVICE KIT   Does not apply   1 Device by Does not apply route once.   1 kit   0   . doxycycline (VIBRAMYCIN) 50 MG capsule   Oral   Take 2 capsules (100 mg total) by mouth 2 (two) times daily.   30 capsule   2   . EXPIRED: Fluticasone-Salmeterol (ADVAIR DISKUS) 250-50 MCG/DOSE AEPB   Inhalation   Inhale 1 puff into the lungs 2 (two) times daily. Inhale 1 puff into the lungs 2 (two) times daily.         Marland Kitchen gabapentin (NEURONTIN) 300 MG capsule      1 po QHS; may increase to 2 po QHS after 1 week if night sweats persist         . glucose blood (ACCU-CHEK AVIVA PLUS) test strip      USE TO TEST BLOOD SUGAR FOUR TIMES DAILY. DX E10.41 LAST REFILL   125 each   0     PT NEEDS APPT FOR FURTHER REFILLS.   Marland Kitchen glycopyrrolate (ROBINUL) 2 MG tablet  Oral   Take 1 tablet (2 mg total) by mouth 2 (two) times daily.   60 tablet   4   . ibuprofen (ADVIL,MOTRIN) 800 MG tablet   Oral   Take 800 mg by mouth.         . insulin aspart (NOVOLOG FLEXPEN) 100 UNIT/ML FlexPen      3 times a day (just before each meal) 12-5-6 units, and pen needles 4/day   15 mL   11   . Insulin Glargine (LANTUS SOLOSTAR) 100 UNIT/ML Solostar Pen   Subcutaneous   Inject 5 Units into the skin at bedtime.   5 pen   PRN   . Insulin Pen Needle 31G X 8 MM MISC      Use as directed          . lipase/protease/amylase (CREON) 36000 UNITS CPEP capsule   Oral   Take 1 capsule (36,000 Units total) by mouth 3 (three) times daily with meals.   120 capsule   3   . metroNIDAZOLE (FLAGYL) 250 MG tablet   Oral   Take 1 tablet (250 mg total) by mouth 3 (three) times  daily.   30 tablet   0     Take with food   . neomycin-polymyxin-hydrocortisone (CORTISPORIN) otic solution   Right Ear   Place 3 drops into the right ear 3 (three) times daily.   10 mL   0   . omeprazole (PRILOSEC) 20 MG capsule   Oral   Take 20 mg by mouth 2 (two) times daily.         Marland Kitchen PARoxetine (PAXIL) 40 MG tablet   Oral   Take 1 tablet (40 mg total) by mouth daily.   30 tablet   0     Appointment needed for further refills   . rifaximin (XIFAXAN) 550 MG TABS tablet   Oral   Take 1 tablet (550 mg total) by mouth 2 (two) times daily.   60 tablet   2     Please void the other Xifaxian rx for TID.... Give ...   . rifaximin (XIFAXAN) 550 MG TABS tablet   Oral   Take 1 tablet (550 mg total) by mouth 3 (three) times daily. Take for 14 days   42 tablet   1   . rOPINIRole (REQUIP) 0.5 MG tablet      TAKE THREE TABLETS BY MOUTH AT BEDTIME   90 tablet   1     Needs appointment for further refills.   . sulfamethoxazole-trimethoprim (BACTRIM DS,SEPTRA DS) 800-160 MG per tablet   Oral   Take 1 tablet by mouth 2 (two) times daily.   20 tablet   0     Allergies Review of patient's allergies indicates no known allergies.  Family History  Problem Relation Age of Onset  . Diabetes Mother   . Diabetes Maternal Aunt   . Emphysema Maternal Aunt   . Emphysema Maternal Uncle   . Diabetes Maternal Grandfather   . Lung cancer Maternal Aunt   . Colon cancer Neg Hx   . Kidney disease Neg Hx   . Liver disease Neg Hx     Social History History  Substance Use Topics  . Smoking status: Current Every Day Smoker -- 1.00 packs/day for 20 years    Types: Cigarettes  . Smokeless tobacco: Never Used     Comment: form given 06/05/14  . Alcohol Use: No    Review of Systems Constitutional: No fever/chills Eyes:  No visual changes. ENT: No sore throat. Painful left ear Cardiovascular: Denies chest pain. Respiratory: Denies shortness of breath. Gastrointestinal: No  abdominal pain.  No nausea, no vomiting. Musculoskeletal: Negative for back pain. Skin: Negative for rash. Positive for "pimple" Neurological: Negative for headaches, focal weakness or numbness.  10-point ROS otherwise negative.  ____________________________________________   PHYSICAL EXAM:  VITAL SIGNS: ED Triage Vitals  Enc Vitals Group     BP 02/04/15 1614 142/78 mmHg     Pulse Rate 02/04/15 1614 98     Resp 02/04/15 1614 18     Temp 02/04/15 1614 98.2 F (36.8 C)     Temp Source 02/04/15 1614 Oral     SpO2 02/04/15 1614 98 %     Weight 02/04/15 1614 130 lb (58.968 kg)     Height 02/04/15 1614 '5\' 5"'  (1.651 m)     Head Cir --      Peak Flow --      Pain Score --      Pain Loc --      Pain Edu? --      Excl. in [place]? --     Constitutional: Alert and oriented. Well appearing and in no acute distress. Eyes: Conjunctivae are normal. PERRL. EOMI. Head: Atraumatic. Nose: No congestion/rhinnorhea. Left ear with moderate tenderness on examination of the external canal. There is no evidence of trauma. TM is without erythema. Neck: No stridor.  Supple Hematological/Lymphatic/Immunilogical: No cervical lymphadenopathy. Cardiovascular: Normal rate, regular rhythm. Grossly normal heart sounds.  Good peripheral circulation. Respiratory: Normal respiratory effort.  No retractions. Lungs CTAB. Gastrointestinal: Soft and nontender. No distention. Musculoskeletal: No lower extremity tenderness nor edema.  No joint effusions. Neurologic:  Normal speech and language. No gross focal neurologic deficits are appreciated. No gait instability. Skin:  Skin is warm, dry and intact. Single erythematous papule mid forehead with moderate tenderness. There is no drainage at present. Psychiatric: Mood and affect are normal. Speech and behavior are normal.  ____________________________________________   LABS (all labs ordered are listed, but only abnormal results are displayed)  Labs Reviewed - No  data to display ____________________________________________  PROCEDURES  Procedure(s) performed: None  Critical Care performed: No  ____________________________________________   INITIAL IMPRESSION / ASSESSMENT AND PLAN / ED COURSE  Pertinent labs & imaging results that were available during my care of the patient were reviewed by me and considered in my medical decision making (see chart for details).  Patient was started on Septra DS for 10 days along with Cortisporin otic suspension for her left ear. ____________________________________________   FINAL CLINICAL IMPRESSION(S) / ED DIAGNOSES  Final diagnoses:  Abscess of forehead  Otalgia of left ear      Johnn Hai, PA-C 02/05/15 [id number]  Earleen Newport, MD 02/07/15 [phone]

## 2015-02-04 NOTE — Discharge Instructions (Signed)
Abscess °An abscess (boil or furuncle) is an infected area on or under the skin. This area is filled with yellowish-white fluid (pus) and other material (debris). °HOME CARE  °· Only take medicines as told by your doctor. °· If you were given antibiotic medicine, take it as directed. Finish the medicine even if you start to feel better. °· If gauze is used, follow your doctor's directions for changing the gauze. °· To avoid spreading the infection: °¨ Keep your abscess covered with a bandage. °¨ Wash your hands well. °¨ Do not share personal care items, towels, or whirlpools with others. °¨ Avoid skin contact with others. °· Keep your skin and clothes clean around the abscess. °· Keep all doctor visits as told. °GET HELP RIGHT AWAY IF:  °· You have more pain, puffiness (swelling), or redness in the wound site. °· You have more fluid or blood coming from the wound site. °· You have muscle aches, chills, or you feel sick. °· You have a fever. °MAKE SURE YOU:  °· Understand these instructions. °· Will watch your condition. °· Will get help right away if you are not doing well or get worse. °Document Released: 12/20/2007 Document Revised: 01/02/2012 Document Reviewed: 09/15/2011 °[employer]® Patient Information ©2015 [employer]. This information is not intended to replace advice given to you by your health care provider. Make sure you discuss any questions you have with your health care provider. ° °

## 2015-02-04 NOTE — ED Notes (Signed)
Patient arrives to [facility] ED with c/o "infected zit". Patient presents with a wound to the anterior medial aspect of the forehead with right sided deviation. Patient also complains of ear pain that she first thought was an ear infection but with online resources later self diagnosed with "glandular pain". Patient is alert, oriented, ambulatory in triage with family.

## 2015-02-11 ENCOUNTER — Other Ambulatory Visit: Payer: Self-pay

## 2015-02-11 MED ORDER — GLUCOSE BLOOD VI STRP: ORAL_STRIP | Status: DC

## 2015-02-23 ENCOUNTER — Ambulatory Visit (INDEPENDENT_AMBULATORY_CARE_PROVIDER_SITE_OTHER): Payer: Medicaid Other | Admitting: Endocrinology

## 2015-02-23 ENCOUNTER — Encounter: Payer: Self-pay | Admitting: Endocrinology

## 2015-02-23 VITALS — BP 117/62 | HR 105 | Temp 98.4°F | Ht 65.0 in | Wt 129.0 lb

## 2015-02-23 DIAGNOSIS — E1065 Type 1 diabetes mellitus with hyperglycemia: Principal | ICD-10-CM

## 2015-02-23 DIAGNOSIS — E1041 Type 1 diabetes mellitus with diabetic mononeuropathy: Secondary | ICD-10-CM | POA: Diagnosis not present

## 2015-02-23 DIAGNOSIS — IMO0002 Reserved for concepts with insufficient information to code with codable children: Secondary | ICD-10-CM

## 2015-02-23 DIAGNOSIS — E1049 Type 1 diabetes mellitus with other diabetic neurological complication: Secondary | ICD-10-CM

## 2015-02-23 LAB — POCT GLYCOSYLATED HEMOGLOBIN (HGB A1C): Hemoglobin A1C: 8.9

## 2015-02-23 NOTE — Patient Instructions (Addendum)
check your blood sugar 4 times a day--before the 3 meals, and at bedtime.  also check if you have symptoms of your blood sugar being too high or too low.  please keep a record of the readings and bring it to your next appointment here.  please call us sooner if your blood sugar goes below 70, or if you have a lot of readings over 200. Please increase the same insulin shots to the amounts listed below.   Please come back for a follow-up appointment in 3 months.   Please reduce the novolog by 2 units if you are going to be active.  If you are unable to anticipate the activity, eat a few crackers with it.

## 2015-02-23 NOTE — Progress Notes (Signed)
Subjective:    Patient ID: [name], female    DOB: Dec 14, 1975, 39 y.o.   MRN: [id number]  HPI  Pt returns for f/u of diabetes mellitus: DM type: 1 Dx'ed: 7078 Complications: poly neuropathy and retinopathy Therapy: insulin since dx GDM: never DKA: never Severe hypoglycemia: never Pancreatitis: never Other: she takes multiple daily injections; therapy has been limited by chronic noncompliance Interval history: no cbg record, but states cbg's vary from 70-200's.  There is no trend throughout the day.  Pt says she does not miss the insulin. She denies hypoglycemia.  She often takes less than the prescribed insulin dosage.  Past Medical History  Diagnosis Date  . DIABETES MELLITUS, TYPE I 02/02/2007  . Anxiety state, unspecified 03/26/2009  . DEPRESSION 02/16/2009  . ASTHMA 02/02/2007  . Shortness of breath 09/25/2007  . GERD 12/21/2008  . Endometriosis     Past Surgical History  Procedure Laterality Date  . Abdominal hysterectomy  2009    BSO    Social History   Social History  . Marital Status: Legally Separated    Spouse Name: N/A  . Number of Children: 2  . Years of Education: N/A   Occupational History  .     Social History Main Topics  . Smoking status: Current Every Day Smoker -- 1.00 packs/day for 20 years    Types: Cigarettes  . Smokeless tobacco: Never Used     Comment: form given 06/05/14  . Alcohol Use: No  . Drug Use: No  . Sexual Activity: Not on file   Other Topics Concern  . Not on file   Social History Narrative   2 children--pt has custody. Husband pays child support.   Daily Caffeine Use-3 2 liters a day   Pt does not get regular exercise.    Current Outpatient Prescriptions on File Prior to Visit  Medication Sig Dispense Refill  . albuterol (PROVENTIL HFA) 108 (90 BASE) MCG/ACT inhaler Inhale 2 puffs into the lungs every 6 (six) hours as needed. For shortness of breath    . atorvastatin (LIPITOR) 10 MG tablet Take 10 mg by mouth at  bedtime.    . Blood Glucose Monitoring Suppl (ACCU-CHEK AVIVA PLUS) W/DEVICE KIT 1 Device by Does not apply route once. 1 kit 0  . doxycycline (VIBRAMYCIN) 50 MG capsule Take 2 capsules (100 mg total) by mouth 2 (two) times daily. 30 capsule 2  . gabapentin (NEURONTIN) 300 MG capsule 1 po QHS; may increase to 2 po QHS after 1 week if night sweats persist    . glucose blood (ACCU-CHEK AVIVA PLUS) test strip USE TO TEST BLOOD SUGAR FOUR TIMES DAILY. DX E10.41 LAST REFILL 125 each 0  . glycopyrrolate (ROBINUL) 2 MG tablet Take 1 tablet (2 mg total) by mouth 2 (two) times daily. 60 tablet 4  . ibuprofen (ADVIL,MOTRIN) 800 MG tablet Take 800 mg by mouth.    . insulin aspart (NOVOLOG FLEXPEN) 100 UNIT/ML FlexPen 3 times a day (just before each meal) 12-5-6 units, and pen needles 4/day 15 mL 11  . Insulin Glargine (LANTUS SOLOSTAR) 100 UNIT/ML Solostar Pen Inject 5 Units into the skin at bedtime. 5 pen PRN  . Insulin Pen Needle 31G X 8 MM MISC Use as directed     . lipase/protease/amylase (CREON) 36000 UNITS CPEP capsule Take 1 capsule (36,000 Units total) by mouth 3 (three) times daily with meals. 120 capsule 3  . metroNIDAZOLE (FLAGYL) 250 MG tablet Take 1 tablet (250 mg  total) by mouth 3 (three) times daily. 30 tablet 0  . omeprazole (PRILOSEC) 20 MG capsule Take 20 mg by mouth 2 (two) times daily.    Marland Kitchen PARoxetine (PAXIL) 40 MG tablet Take 1 tablet (40 mg total) by mouth daily. 30 tablet 0  . rifaximin (XIFAXAN) 550 MG TABS tablet Take 1 tablet (550 mg total) by mouth 2 (two) times daily. 60 tablet 2  . rifaximin (XIFAXAN) 550 MG TABS tablet Take 1 tablet (550 mg total) by mouth 3 (three) times daily. Take for 14 days 42 tablet 1  . rOPINIRole (REQUIP) 0.5 MG tablet TAKE THREE TABLETS BY MOUTH AT BEDTIME 90 tablet 1  . sulfamethoxazole-trimethoprim (BACTRIM DS,SEPTRA DS) 800-160 MG per tablet Take 1 tablet by mouth 2 (two) times daily. 20 tablet 0  . Fluticasone-Salmeterol (ADVAIR DISKUS) 250-50  MCG/DOSE AEPB Inhale 1 puff into the lungs 2 (two) times daily. Inhale 1 puff into the lungs 2 (two) times daily.     No current facility-administered medications on file prior to visit.    No Known Allergies  Family History  Problem Relation Age of Onset  . Diabetes Mother   . Diabetes Maternal Aunt   . Emphysema Maternal Aunt   . Emphysema Maternal Uncle   . Diabetes Maternal Grandfather   . Lung cancer Maternal Aunt   . Colon cancer Neg Hx   . Kidney disease Neg Hx   . Liver disease Neg Hx     BP 117/62 mmHg  Pulse 105  Temp(Src) 98.4 F (36.9 C) (Oral)  Ht '5\' 5"'  (1.651 m)  Wt 129 lb (58.514 kg)  BMI 21.47 kg/m2  SpO2 95%    Review of Systems No weight change    Objective:   Physical Exam VITAL SIGNS:  See vs page GENERAL: no distress Pulses: dorsalis pedis intact bilat.   MSK: no deformity of the feet CV: no leg edema Skin:  no ulcer on the feet.  normal color and temp on the feet. Neuro: sensation is intact to touch on the feet, but decreased from normal.    A1c=8.9%    Assessment & Plan:  DM: she needs increased rx.   Patient is advised the following: Patient Instructions  check your blood sugar 4 times a day--before the 3 meals, and at bedtime.  also check if you have symptoms of your blood sugar being too high or too low.  please keep a record of the readings and bring it to your next appointment here.  please call us sooner if your blood sugar goes below 70, or if you have a lot of readings over 200. Please increase the same insulin shots to the amounts listed below.   Please come back for a follow-up appointment in 3 months.   Please reduce the novolog by 2 units if you are going to be active.  If you are unable to anticipate the activity, eat a few crackers with it.

## 2015-03-23 ENCOUNTER — Other Ambulatory Visit: Payer: Self-pay

## 2015-03-23 MED ORDER — GLUCOSE BLOOD VI STRP: ORAL_STRIP | Status: DC

## 2015-05-11 ENCOUNTER — Other Ambulatory Visit: Payer: Self-pay | Admitting: Endocrinology

## 2015-05-26 ENCOUNTER — Ambulatory Visit: Payer: Medicaid Other | Admitting: Endocrinology

## 2015-05-26 DIAGNOSIS — Z0289 Encounter for other administrative examinations: Secondary | ICD-10-CM

## 2015-05-28 ENCOUNTER — Emergency Department
Admission: EM | Admit: 2015-05-28 | Discharge: 2015-05-29 | Disposition: A | Discharge status: Home / Self Care | Payer: Medicaid Other | Attending: Emergency Medicine | Admitting: Emergency Medicine

## 2015-05-28 ENCOUNTER — Telehealth: Payer: Self-pay | Admitting: Endocrinology

## 2015-05-28 ENCOUNTER — Encounter: Payer: Self-pay | Admitting: *Deleted

## 2015-05-28 DIAGNOSIS — M779 Enthesopathy, unspecified: Secondary | ICD-10-CM | POA: Diagnosis not present

## 2015-05-28 DIAGNOSIS — S5011XA Contusion of right forearm, initial encounter: Secondary | ICD-10-CM | POA: Diagnosis not present

## 2015-05-28 DIAGNOSIS — Z794 Long term (current) use of insulin: Secondary | ICD-10-CM | POA: Insufficient documentation

## 2015-05-28 DIAGNOSIS — Y9289 Other specified places as the place of occurrence of the external cause: Secondary | ICD-10-CM | POA: Diagnosis not present

## 2015-05-28 DIAGNOSIS — Z79899 Other long term (current) drug therapy: Secondary | ICD-10-CM | POA: Diagnosis not present

## 2015-05-28 DIAGNOSIS — E109 Type 1 diabetes mellitus without complications: Secondary | ICD-10-CM | POA: Diagnosis not present

## 2015-05-28 DIAGNOSIS — Z720 Tobacco use: Secondary | ICD-10-CM | POA: Diagnosis not present

## 2015-05-28 DIAGNOSIS — Z792 Long term (current) use of antibiotics: Secondary | ICD-10-CM | POA: Insufficient documentation

## 2015-05-28 DIAGNOSIS — Y9389 Activity, other specified: Secondary | ICD-10-CM | POA: Insufficient documentation

## 2015-05-28 DIAGNOSIS — Y998 Other external cause status: Secondary | ICD-10-CM | POA: Insufficient documentation

## 2015-05-28 DIAGNOSIS — Z7951 Long term (current) use of inhaled steroids: Secondary | ICD-10-CM | POA: Insufficient documentation

## 2015-05-28 DIAGNOSIS — X58XXXA Exposure to other specified factors, initial encounter: Secondary | ICD-10-CM | POA: Diagnosis not present

## 2015-05-28 DIAGNOSIS — M79601 Pain in right arm: Secondary | ICD-10-CM | POA: Diagnosis present

## 2015-05-28 NOTE — ED Notes (Signed)
Pt reports right arm tingling and pain x 2-3 weeks.  Pt reports swelling and knot in arm also.  No swelling noted in triage.

## 2015-05-28 NOTE — Telephone Encounter (Signed)
Patient no showed today's appt. Please advise on how to follow up. °A. No follow up necessary. °B. Follow up urgent. Contact patient immediately. °C. Follow up necessary. Contact patient and schedule visit in ___ days. °D. Follow up advised. Contact patient and schedule visit in ____weeks. ° °

## 2015-05-29 MED ORDER — OXYCODONE-ACETAMINOPHEN 5-325 MG PO TABS
1.0000 | ORAL_TABLET | Freq: Once | ORAL | Status: AC
  Administered 2015-05-29: 1 via ORAL
  Filled 2015-05-29: qty 1

## 2015-05-29 MED ORDER — OXYCODONE-ACETAMINOPHEN 5-325 MG PO TABS: 1.0000 | ORAL_TABLET | Freq: Four times a day (QID) | ORAL | Status: DC | PRN

## 2015-05-29 NOTE — ED Provider Notes (Signed)
[facility] Emergency Department Provider Note  ____________________________________________  Time seen: 1:00 AM  I have reviewed the triage vital signs and the nursing notes.   HISTORY  Chief Complaint Arm Pain    HPI [name] is a 39 y.o. female who complains of right wrist and forearm pain past 2-3 weeks which started the day after she was helping moving a large washing machine with some friends. She wore a wrist brace on the right wrist that she bought from a store for a while which seemed to help, but she still has pain in the right forearm. Today she felt like there was some sort of a cyst or swelling on the right forearm and now is gone. The area is painful to the touch and worse with movement. No numbness tingling or weakness. No other injuries.     Past Medical History  Diagnosis Date  . DIABETES MELLITUS, TYPE I 02/02/2007  . Anxiety state, unspecified 03/26/2009  . DEPRESSION 02/16/2009  . ASTHMA 02/02/2007  . Shortness of breath 09/25/2007  . GERD 12/21/2008  . Endometriosis      Patient Active Problem List   Diagnosis Date Noted  . Type 1 diabetes mellitus with neurological manifestations, uncontrolled ([place]) 01/27/2014  . Hematuria 03/26/2012  . Myalgia 11/10/2010  . AIRWAY INFLAMMATION 06/08/2010  . Nonspecific (abnormal) findings on radiological and other examination of body structure 05/12/2010  . [place] LUNG FIELD 05/12/2010  . VULVAR ABSCESS 03/18/2010  . UTI 03/09/2010  . INSOMNIA 02/18/2010  . FOLLICULITIS 78/24/2353  . ANXIETY STATE, UNSPECIFIED 03/26/2009  . NAUSEA 02/19/2009  . DEPRESSION 02/16/2009  . GERD 12/21/2008  . ASTHMATIC BRONCHITIS, ACUTE 08/12/2008  . ACUTE MAXILLARY SINUSITIS 05/04/2008  . MOLE 01/29/2008  . Diarrhea 10/16/2007  . SMOKER 09/25/2007  . SHORTNESS OF BREATH 09/25/2007  . ASTHMA 02/02/2007     Past Surgical History  Procedure Laterality Date  . Abdominal  hysterectomy  2009    BSO     Current Outpatient Rx  Name  Route  Sig  Dispense  Refill  . albuterol (PROVENTIL HFA) 108 (90 BASE) MCG/ACT inhaler   Inhalation   Inhale 2 puffs into the lungs every 6 (six) hours as needed. For shortness of breath         . atorvastatin (LIPITOR) 10 MG tablet   Oral   Take 10 mg by mouth at bedtime.         . Blood Glucose Monitoring Suppl (ACCU-CHEK AVIVA PLUS) W/DEVICE KIT   Does not apply   1 Device by Does not apply route once.   1 kit   0   . doxycycline (VIBRAMYCIN) 50 MG capsule   Oral   Take 2 capsules (100 mg total) by mouth 2 (two) times daily.   30 capsule   2   . EXPIRED: Fluticasone-Salmeterol (ADVAIR DISKUS) 250-50 MCG/DOSE AEPB   Inhalation   Inhale 1 puff into the lungs 2 (two) times daily. Inhale 1 puff into the lungs 2 (two) times daily.         Marland Kitchen gabapentin (NEURONTIN) 300 MG capsule      1 po QHS; may increase to 2 po QHS after 1 week if night sweats persist         . glucose blood (ACCU-CHEK AVIVA PLUS) test strip      USE TO TEST BLOOD SUGAR FOUR TIMES DAILY. DX E10.41   125 each   4   .  glycopyrrolate (ROBINUL) 2 MG tablet   Oral   Take 1 tablet (2 mg total) by mouth 2 (two) times daily.   60 tablet   4   . ibuprofen (ADVIL,MOTRIN) 800 MG tablet   Oral   Take 800 mg by mouth.         . Insulin Glargine (LANTUS SOLOSTAR) 100 UNIT/ML Solostar Pen   Subcutaneous   Inject 5 Units into the skin at bedtime.   5 pen   PRN   . Insulin Pen Needle 31G X 8 MM MISC      Use as directed          . lipase/protease/amylase (CREON) 36000 UNITS CPEP capsule   Oral   Take 1 capsule (36,000 Units total) by mouth 3 (three) times daily with meals.   120 capsule   3   . metroNIDAZOLE (FLAGYL) 250 MG tablet   Oral   Take 1 tablet (250 mg total) by mouth 3 (three) times daily.   30 tablet   0     Take with food   . NOVOLOG FLEXPEN 100 UNIT/ML FlexPen      INJECT SUBCUTANEOUSLY THREE TIMES DAILY  BEFORE EACH MEAL, 12 UNITS WITH BREAKFAST, 5 UNITS WITH LUNCH AND 6 UNITS WITH SUPPER   15 pen   2   . omeprazole (PRILOSEC) 20 MG capsule   Oral   Take 20 mg by mouth 2 (two) times daily.         Marland Kitchen oxyCODONE-acetaminophen (ROXICET) 5-325 MG tablet   Oral   Take 1 tablet by mouth every 6 (six) hours as needed for severe pain.   12 tablet   0   . PARoxetine (PAXIL) 40 MG tablet   Oral   Take 1 tablet (40 mg total) by mouth daily.   30 tablet   0     Appointment needed for further refills   . rifaximin (XIFAXAN) 550 MG TABS tablet   Oral   Take 1 tablet (550 mg total) by mouth 2 (two) times daily.   60 tablet   2     Please void the other Xifaxian rx for TID.... Give ...   . rifaximin (XIFAXAN) 550 MG TABS tablet   Oral   Take 1 tablet (550 mg total) by mouth 3 (three) times daily. Take for 14 days   42 tablet   1   . rOPINIRole (REQUIP) 0.5 MG tablet      TAKE THREE TABLETS BY MOUTH AT BEDTIME   90 tablet   1     Needs appointment for further refills.   . sulfamethoxazole-trimethoprim (BACTRIM DS,SEPTRA DS) 800-160 MG per tablet   Oral   Take 1 tablet by mouth 2 (two) times daily.   20 tablet   0      Allergies Review of patient's allergies indicates no known allergies.   Family History  Problem Relation Age of Onset  . Diabetes Mother   . Diabetes Maternal Aunt   . Emphysema Maternal Aunt   . Emphysema Maternal Uncle   . Diabetes Maternal Grandfather   . Lung cancer Maternal Aunt   . Colon cancer Neg Hx   . Kidney disease Neg Hx   . Liver disease Neg Hx     Social History Social History  Substance Use Topics  . Smoking status: Current Every Day Smoker -- 1.00 packs/day for 20 years    Types: Cigarettes  . Smokeless tobacco: Never Used     Comment:  form given 06/05/14  . Alcohol Use: No    Review of Systems  Constitutional:   No fever or chills. No weight changes Eyes:   No blurry vision or double vision.  ENT:   No sore  throat. Cardiovascular:   No chest pain. Respiratory:   No dyspnea or cough. Gastrointestinal:   Negative for abdominal pain, vomiting and diarrhea.  No BRBPR or melena. Genitourinary:   Negative for dysuria, urinary retention, bloody urine, or difficulty urinating. Musculoskeletal:   Right arm pain as above. Skin:   Negative for rash. Neurological:   Negative for headaches, focal weakness or numbness. Psychiatric:  No anxiety or depression.   Endocrine:  No hot/cold intolerance, changes in energy, or sleep difficulty.  10-point ROS otherwise negative.  ____________________________________________   PHYSICAL EXAM:  VITAL SIGNS: ED Triage Vitals  Enc Vitals Group     BP 05/28/15 2334 165/67 mmHg     Pulse Rate 05/28/15 2334 88     Resp 05/28/15 2334 18     Temp 05/28/15 2334 98 F (36.7 C)     Temp Source 05/28/15 2334 Oral     SpO2 05/28/15 2334 100 %     Weight 05/28/15 2334 130 lb (58.968 kg)     Height 05/28/15 2334 5' 5" (1.651 m)     Head Cir --      Peak Flow --      Pain Score 05/28/15 2334 8     Pain Loc --      Pain Edu? --      Excl. in [place]? --      Constitutional:   Alert and oriented. Well appearing and in no distress. Eyes:   No scleral icterus. No conjunctival pallor. PERRL. EOMI ENT   Head:   Normocephalic and atraumatic.   Nose:   No congestion/rhinnorhea. No septal hematoma   Mouth/Throat:   MMM, no pharyngeal erythema. No peritonsillar mass. No uvula shift.   Neck:   No stridor. No SubQ emphysema. No meningismus. Hematological/Lymphatic/Immunilogical:   No cervical lymphadenopathy. Cardiovascular:   RRR. Normal and symmetric distal pulses are present in all extremities. No murmurs, rubs, or gallops. Respiratory:   Normal respiratory effort without tachypnea nor retractions. Breath sounds are clear and equal bilaterally. No wheezes/rales/rhonchi. Gastrointestinal:   Soft and nontender. No distention. There is no CVA tenderness.  No  rebound, rigidity, or guarding. Genitourinary:   deferred Musculoskeletal:   Right forearm is soft with soft compartments. There is tenderness diffusely over the forearm tendon bellies. Intact range of motion and strength. Distal pulses and sensation and capillary refill are all normal. Neurologic:   Normal speech and language.  CN 2-10 normal. Motor grossly intact. No pronator drift.  Normal gait. No gross focal neurologic deficits are appreciated.  Skin:    Skin is warm, dry and intact. Skin color is slightly darkened in the affected area of the right forearm compared to the left, suggestive of some faint bruising under the skin in that area..  No petechiae, purpura, or bullae. Psychiatric:   Mood and affect are normal. Speech and behavior are normal. Patient exhibits appropriate insight and judgment.  ____________________________________________    LABS (pertinent positives/negatives) (all labs ordered are listed, but only abnormal results are displayed) Labs Reviewed - No data to display ____________________________________________   EKG    ____________________________________________    RADIOLOGY    ____________________________________________   PROCEDURES   ____________________________________________   INITIAL IMPRESSION / ASSESSMENT AND PLAN / ED COURSE  Pertinent labs & imaging results that were available during my care of the patient were reviewed by me and considered in my medical decision making (see chart for details).  Patient presents with right forearm pain which appears to be tendinitis. No evidence of bony injury or soft tissue infection. Motor function is all intact. Counseled her on gentle range of motion exercises, we'll provide analgesics, follow up primary care.     ____________________________________________   FINAL CLINICAL IMPRESSION(S) / ED DIAGNOSES  Final diagnoses:  Tendonitis      Carrie Mew, MD 05/29/15 [phone]

## 2015-05-29 NOTE — ED Notes (Signed)
Pt was instructed that she can not drive while taking percocet,  She verbalized understanding and stated that she will not be driving.

## 2015-05-29 NOTE — Telephone Encounter (Signed)
Follow up advised. Contact patient and schedule visit in 4 weeks 

## 2015-05-29 NOTE — Discharge Instructions (Signed)
You were prescribed a medication that is potentially sedating. Do not drink alcohol, drive or participate in any other potentially dangerous activities while taking this medication as it may make you sleepy. Do not take this medication with any other sedating medications, either prescription or over-the-counter. If you were prescribed Percocet or Vicodin, do not take these with acetaminophen (Tylenol) as it is already contained within these medications.   Opioid pain medications (or "narcotics") can be habit forming.  Use it as little as possible to achieve adequate pain control.  Do not use or use it with extreme caution if you have a history of opiate abuse or dependence.  If you are on a pain contract with your primary care doctor or a pain specialist, be sure to let them know you were prescribed this medication today from the [facility] Emergency Department.  This medication is intended for your use only - do not give any to anyone else and keep it in a secure place where nobody else, especially children and pets, have access to it.  It will also cause or worsen constipation, so you may want to consider taking an over-the-counter stool softener while you are taking this medication.

## 2015-05-31 NOTE — Telephone Encounter (Signed)
No show letter mailed to the pt.  

## 2015-06-03 ENCOUNTER — Telehealth: Payer: Self-pay | Admitting: Gastroenterology

## 2015-06-03 NOTE — Telephone Encounter (Signed)
Patient needed to be seen before Xifaxian refills.. Has not been seen in 1 year. Former [facility] patient  Made appointment with Dr Havery Moros for 06/04/2015 at 3pm  That was the soonest appt available for her

## 2015-06-04 ENCOUNTER — Encounter: Payer: Self-pay | Admitting: Gastroenterology

## 2015-06-04 ENCOUNTER — Ambulatory Visit (INDEPENDENT_AMBULATORY_CARE_PROVIDER_SITE_OTHER): Payer: Medicaid Other | Admitting: Gastroenterology

## 2015-06-04 VITALS — BP 112/62 | HR 60 | Ht 65.0 in | Wt 131.0 lb

## 2015-06-04 DIAGNOSIS — R197 Diarrhea, unspecified: Secondary | ICD-10-CM

## 2015-06-04 DIAGNOSIS — K909 Intestinal malabsorption, unspecified: Secondary | ICD-10-CM

## 2015-06-04 DIAGNOSIS — K2100 Gastro-esophageal reflux disease with esophagitis, without bleeding: Secondary | ICD-10-CM

## 2015-06-04 DIAGNOSIS — R140 Abdominal distension (gaseous): Secondary | ICD-10-CM

## 2015-06-04 MED ORDER — RIFAXIMIN 550 MG PO TABS: 550.0000 mg | ORAL_TABLET | Freq: Three times a day (TID) | ORAL | Status: DC

## 2015-06-04 NOTE — Patient Instructions (Signed)
We have sent medications to your pharmacy for you to pick up at your convenience.  Please start Low FODMAP diet.

## 2015-06-04 NOTE — Progress Notes (Signed)
HPI :  39 y/o female, former patient of Dr. Deatra Ina, new to me. She has a history of insulin dependant DM complicated by neuropathy, also with GERD. She has had intermittent diarrhea with bloating over the years. Prior colonoscopy and EGDs for this issue as outlined below. 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 She has tested positive for fecal fat in the stool previously, however pancreatic elastase has been normal. 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   She is here for follow up. She reports she has diarrhea with each meal she eats, and has foul smelling gas and stools. All stools are loose. She is having roughly 4-5 BMs per day or so, sometimes sees oil in the stool. She has a lot of urgency to use to the bathroom. She has a lot of gas and bloating. She has some nausea, but does not vomit. She eats okay. She does have some heartburn for which she takes prilosec 36m q day which controls her smyptoms for the most part without significant breakthrough. She denies blood in the stools. She avoids eating outside of her house due to these symptoms. She has been treated for SIBO in the past and she has had some benefit. She last had Rifaximin about a year ago. Weight is stable, no weight loss. She denies NSAID use routinely. She denies narcotic use (listed in medications). She has not been on pancreatic enzyme replacement therapy.  She has had prior negative celiac ABs. No FH of celiac disease.   Colo 4/09 - normal EGD 5/09 - esophagitis  Past Medical History  Diagnosis Date  . DIABETES MELLITUS, TYPE I 02/02/2007  . Anxiety state, unspecified 03/26/2009  . DEPRESSION 02/16/2009  . ASTHMA 02/02/2007  . Shortness of breath 09/25/2007  . GERD 12/21/2008  . Endometriosis      Past Surgical History  Procedure Laterality Date  . Abdominal hysterectomy  2009    BSO   Family History  Problem Relation Age of Onset  . Diabetes Mother   .  Diabetes Maternal Aunt   . Emphysema Maternal Aunt   . Emphysema Maternal Uncle   . Diabetes Maternal Grandfather   . Lung cancer Maternal Aunt   . Colon cancer Neg Hx   . Kidney disease Neg Hx   . Liver disease Neg Hx    Social History  Substance Use Topics  . Smoking status: Current Every Day Smoker -- 1.00 packs/day for 20 years    Types: Cigarettes  . Smokeless tobacco: Never Used     Comment: form given 06/05/14  . Alcohol Use: No   Current Outpatient Prescriptions  Medication Sig Dispense Refill  . albuterol (PROVENTIL HFA) 108 (90 BASE) MCG/ACT inhaler Inhale 2 puffs into the lungs every 6 (six) hours as needed. For shortness of breath    . atorvastatin (LIPITOR) 10 MG tablet Take 10 mg by mouth at bedtime.    . Blood Glucose Monitoring Suppl (ACCU-CHEK AVIVA PLUS) W/DEVICE KIT 1 Device by Does not apply route once. 1 kit 0  . gabapentin (NEURONTIN) 300 MG capsule 1 po QHS; may increase to 2 po QHS after 1 week if night sweats persist    . glucose blood (ACCU-CHEK AVIVA PLUS) test strip USE TO TEST BLOOD SUGAR FOUR TIMES DAILY. DX E10.41 125 each 4  . ibuprofen (ADVIL,MOTRIN) 800 MG tablet Take 800 mg by mouth.    . Insulin Glargine (LANTUS SOLOSTAR) 100  UNIT/ML Solostar Pen Inject 5 Units into the skin at bedtime. 5 pen PRN  . Insulin Pen Needle 31G X 8 MM MISC Use as directed     . NOVOLOG FLEXPEN 100 UNIT/ML FlexPen INJECT SUBCUTANEOUSLY THREE TIMES DAILY BEFORE EACH MEAL, 12 UNITS WITH BREAKFAST, 5 UNITS WITH LUNCH AND 6 UNITS WITH SUPPER 15 pen 2  . omeprazole (PRILOSEC) 20 MG capsule Take 20 mg by mouth 2 (two) times daily.    Marland Kitchen oxyCODONE-acetaminophen (ROXICET) 5-325 MG tablet Take 1 tablet by mouth every 6 (six) hours as needed for severe pain. 12 tablet 0  . PARoxetine (PAXIL) 40 MG tablet Take 1 tablet (40 mg total) by mouth daily. 30 tablet 0  . rOPINIRole (REQUIP) 0.5 MG tablet TAKE THREE TABLETS BY MOUTH AT BEDTIME 90 tablet 1  . Fluticasone-Salmeterol (ADVAIR  DISKUS) 250-50 MCG/DOSE AEPB Inhale 1 puff into the lungs 2 (two) times daily. Inhale 1 puff into the lungs 2 (two) times daily.     No current facility-administered medications for this visit.   No Known Allergies   Review of Systems: All systems reviewed and negative except where noted in HPI.   Lab Results  Component Value Date   WBC 8.3 06/05/2014   HGB 14.4 06/05/2014   HCT 42.6 06/05/2014   MCV 91.1 06/05/2014   PLT 226.0 06/05/2014    Lab Results  Component Value Date   ALT 21 02/08/2014   AST 27 02/08/2014   ALKPHOS 77 02/08/2014   BILITOT 0.2 02/08/2014     Physical Exam: BP 112/62 mmHg  Pulse 60  Ht '5\' 5"'  (1.651 m)  Wt 131 lb (59.421 kg)  BMI 21.80 kg/m2 Constitutional: Pleasant,well-developed, female in no acute distress. HEENT: Normocephalic and atraumatic. Conjunctivae are normal. No scleral icterus. Neck supple.  Cardiovascular: Normal rate, regular rhythm.  Pulmonary/chest: Effort normal and breath sounds normal. No wheezing, rales or rhonchi. Abdominal: Soft, nondistended, mild lower abdominal TTP without rebound or guarding. Bowel sounds active throughout. There are no masses palpable. No hepatomegaly. Extremities: no edema Lymphadenopathy: No cervical adenopathy noted. Neurological: Alert and oriented to person place and time. Skin: Skin is warm and dry. No rashes noted. Psychiatric: Normal mood and affect. Behavior is normal.   ASSESSMENT AND PLAN: 39 y/o female with history of insulin DM with peripheral neuropathy, here for follow up for diarrhea and abdominal bloating. Prior workup for celiac negative, colonoscopy and EGD unremarkable for cause other than esophagitis noted. She has been treated empirically for SIBO in the past with good benefit, and suspect this is the cause of her symptoms. Prior fecal fat was positive however pancreatic elastase normal, thus it appears she is malabsorbing fat due to this process. She has not had antibiotics for  this in over a year. Recommend a course of Rifaximin 595m TID for 2 weeks and see if this helps. I also counseled her on a low FODMOP diet to see if this helps any of her bloating as well. If symptoms persist may consider trial of other agents. She agreed with the plan and can follow up as needed for this issue. Otherwise continue present dose of PPI, working well for her GERD symptoms.  SCarolina Cellar MD [facility] Pager [phone]

## 2015-06-07 ENCOUNTER — Telehealth: Payer: Self-pay | Admitting: Gastroenterology

## 2015-06-08 NOTE — Telephone Encounter (Signed)
Sent!

## 2015-06-15 ENCOUNTER — Telehealth: Payer: Self-pay | Admitting: Gastroenterology

## 2015-06-15 MED ORDER — RIFAXIMIN 550 MG PO TABS: 550.0000 mg | ORAL_TABLET | Freq: Three times a day (TID) | ORAL | Status: DC

## 2015-06-15 NOTE — Telephone Encounter (Signed)
i have resent rx to pharmacy to see if i get PA.

## 2015-06-16 ENCOUNTER — Emergency Department
Admission: EM | Admit: 2015-06-16 | Discharge: 2015-06-16 | Disposition: A | Discharge status: Home / Self Care | Payer: Medicaid Other | Attending: Student | Admitting: Student

## 2015-06-16 ENCOUNTER — Encounter: Payer: Self-pay | Admitting: Emergency Medicine

## 2015-06-16 DIAGNOSIS — J029 Acute pharyngitis, unspecified: Secondary | ICD-10-CM

## 2015-06-16 DIAGNOSIS — Z79899 Other long term (current) drug therapy: Secondary | ICD-10-CM | POA: Diagnosis not present

## 2015-06-16 DIAGNOSIS — Z792 Long term (current) use of antibiotics: Secondary | ICD-10-CM | POA: Diagnosis not present

## 2015-06-16 DIAGNOSIS — E1049 Type 1 diabetes mellitus with other diabetic neurological complication: Secondary | ICD-10-CM | POA: Insufficient documentation

## 2015-06-16 DIAGNOSIS — F17210 Nicotine dependence, cigarettes, uncomplicated: Secondary | ICD-10-CM | POA: Diagnosis not present

## 2015-06-16 DIAGNOSIS — Z791 Long term (current) use of non-steroidal anti-inflammatories (NSAID): Secondary | ICD-10-CM | POA: Diagnosis not present

## 2015-06-16 DIAGNOSIS — Z794 Long term (current) use of insulin: Secondary | ICD-10-CM | POA: Diagnosis not present

## 2015-06-16 DIAGNOSIS — Z7951 Long term (current) use of inhaled steroids: Secondary | ICD-10-CM | POA: Insufficient documentation

## 2015-06-16 DIAGNOSIS — J45909 Unspecified asthma, uncomplicated: Secondary | ICD-10-CM | POA: Insufficient documentation

## 2015-06-16 LAB — POCT RAPID STREP A: Streptococcus, Group A Screen (Direct): NEGATIVE

## 2015-06-16 LAB — CBC WITH DIFFERENTIAL/PLATELET
BASOS PCT: 1 %
Basophils Absolute: 0 10*3/uL (ref 0–0.1)
EOS ABS: 0 10*3/uL (ref 0–0.7)
Eosinophils Relative: 1 %
HCT: 40.1 % (ref 35.0–47.0)
Hemoglobin: 14.1 g/dL (ref 12.0–16.0)
LYMPHS ABS: 1.4 10*3/uL (ref 1.0–3.6)
Lymphocytes Relative: 26 %
MCH: 31.2 pg (ref 26.0–34.0)
MCHC: 35 g/dL (ref 32.0–36.0)
MCV: 89 fL (ref 80.0–100.0)
MONO ABS: 0.5 10*3/uL (ref 0.2–0.9)
Monocytes Relative: 8 %
NEUTROS ABS: 3.6 10*3/uL (ref 1.4–6.5)
NEUTROS PCT: 64 %
PLATELETS: 167 10*3/uL (ref 150–440)
RBC: 4.51 MIL/uL (ref 3.80–5.20)
RDW: 13.1 % (ref 11.5–14.5)
WBC: 5.6 10*3/uL (ref 3.6–11.0)

## 2015-06-16 LAB — MONONUCLEOSIS SCREEN: Mono Screen: NEGATIVE

## 2015-06-16 MED ORDER — AMOXICILLIN 500 MG PO TABS: 500.0000 mg | ORAL_TABLET | Freq: Three times a day (TID) | ORAL | Status: DC

## 2015-06-16 MED ORDER — ACETAMINOPHEN-CODEINE #3 300-30 MG PO TABS: 1.0000 | ORAL_TABLET | ORAL | Status: DC | PRN

## 2015-06-16 NOTE — Discharge Instructions (Signed)

## 2015-06-16 NOTE — ED Notes (Signed)
Provider at bedside

## 2015-06-16 NOTE — ED Notes (Signed)
Patient ambulatory to triage with steady gait, without difficulty or distress noted; pt reports since Saturday having sore throat and fever

## 2015-06-16 NOTE — ED Provider Notes (Signed)
[facility] Emergency Department Provider Note  ____________________________________________  Time seen: Approximately 8:01 PM  I have reviewed the triage vital signs and the nursing notes.   HISTORY  Chief Complaint Sore Throat   HPI [name] is a 39 y.o. female who presents to the emergency department for evaluation of sore throat since Saturday. She reports being exposed to mono. She is a Type 1 Diabetic. She denies fevers. She complains of sore throat and lymph node tenderness of her neck. No difficulty swallowing. No relief with OTC medications.   Past Medical History  Diagnosis Date  . DIABETES MELLITUS, TYPE I 02/02/2007  . Anxiety state, unspecified 03/26/2009  . DEPRESSION 02/16/2009  . ASTHMA 02/02/2007  . Shortness of breath 09/25/2007  . GERD 12/21/2008  . Endometriosis     Patient Active Problem List   Diagnosis Date Noted  . Type 1 diabetes mellitus with neurological manifestations, uncontrolled ([place]) 01/27/2014  . Hematuria 03/26/2012  . Myalgia 11/10/2010  . AIRWAY INFLAMMATION 06/08/2010  . Nonspecific (abnormal) findings on radiological and other examination of body structure 05/12/2010  . [place] LUNG FIELD 05/12/2010  . VULVAR ABSCESS 03/18/2010  . UTI 03/09/2010  . INSOMNIA 02/18/2010  . FOLLICULITIS 61/60/7371  . ANXIETY STATE, UNSPECIFIED 03/26/2009  . NAUSEA 02/19/2009  . DEPRESSION 02/16/2009  . GERD 12/21/2008  . ASTHMATIC BRONCHITIS, ACUTE 08/12/2008  . ACUTE MAXILLARY SINUSITIS 05/04/2008  . MOLE 01/29/2008  . Diarrhea 10/16/2007  . SMOKER 09/25/2007  . SHORTNESS OF BREATH 09/25/2007  . ASTHMA 02/02/2007    Past Surgical History  Procedure Laterality Date  . Abdominal hysterectomy  2009    BSO    Current Outpatient Rx  Name  Route  Sig  Dispense  Refill  . acetaminophen-codeine (TYLENOL #3) 300-30 MG tablet   Oral   Take 1-2 tablets by mouth every 4 (four) hours as needed  for moderate pain.   12 tablet   0   . albuterol (PROVENTIL HFA) 108 (90 BASE) MCG/ACT inhaler   Inhalation   Inhale 2 puffs into the lungs every 6 (six) hours as needed. For shortness of breath         . amoxicillin (AMOXIL) 500 MG tablet   Oral   Take 1 tablet (500 mg total) by mouth 3 (three) times daily.   30 tablet   0   . atorvastatin (LIPITOR) 10 MG tablet   Oral   Take 10 mg by mouth at bedtime.         . Blood Glucose Monitoring Suppl (ACCU-CHEK AVIVA PLUS) W/DEVICE KIT   Does not apply   1 Device by Does not apply route once.   1 kit   0   . EXPIRED: Fluticasone-Salmeterol (ADVAIR DISKUS) 250-50 MCG/DOSE AEPB   Inhalation   Inhale 1 puff into the lungs 2 (two) times daily. Inhale 1 puff into the lungs 2 (two) times daily.         Marland Kitchen gabapentin (NEURONTIN) 300 MG capsule      1 po QHS; may increase to 2 po QHS after 1 week if night sweats persist         . glucose blood (ACCU-CHEK AVIVA PLUS) test strip      USE TO TEST BLOOD SUGAR FOUR TIMES DAILY. DX E10.41   125 each   4   . ibuprofen (ADVIL,MOTRIN) 800 MG tablet   Oral   Take 800 mg by mouth.         Marland Kitchen  Insulin Glargine (LANTUS SOLOSTAR) 100 UNIT/ML Solostar Pen   Subcutaneous   Inject 5 Units into the skin at bedtime.   5 pen   PRN   . Insulin Pen Needle 31G X 8 MM MISC      Use as directed          . NOVOLOG FLEXPEN 100 UNIT/ML FlexPen      INJECT SUBCUTANEOUSLY THREE TIMES DAILY BEFORE EACH MEAL, 12 UNITS WITH BREAKFAST, 5 UNITS WITH LUNCH AND 6 UNITS WITH SUPPER   15 pen   2   . omeprazole (PRILOSEC) 20 MG capsule   Oral   Take 20 mg by mouth 2 (two) times daily.         Marland Kitchen oxyCODONE-acetaminophen (ROXICET) 5-325 MG tablet   Oral   Take 1 tablet by mouth every 6 (six) hours as needed for severe pain.   12 tablet   0   . PARoxetine (PAXIL) 40 MG tablet   Oral   Take 1 tablet (40 mg total) by mouth daily.   30 tablet   0     Appointment needed for further refills    . rifaximin (XIFAXAN) 550 MG TABS tablet   Oral   Take 1 tablet (550 mg total) by mouth 3 (three) times daily.   42 tablet   0   . rOPINIRole (REQUIP) 0.5 MG tablet      TAKE THREE TABLETS BY MOUTH AT BEDTIME   90 tablet   1     Needs appointment for further refills.     Allergies Review of patient's allergies indicates no known allergies.  Family History  Problem Relation Age of Onset  . Diabetes Mother   . Diabetes Maternal Aunt   . Emphysema Maternal Aunt   . Emphysema Maternal Uncle   . Diabetes Maternal Grandfather   . Lung cancer Maternal Aunt   . Colon cancer Neg Hx   . Kidney disease Neg Hx   . Liver disease Neg Hx     Social History Social History  Substance Use Topics  . Smoking status: Current Every Day Smoker -- 1.00 packs/day for 20 years    Types: Cigarettes  . Smokeless tobacco: Never Used     Comment: form given 06/05/14  . Alcohol Use: No    Review of Systems Constitutional: Negative for fever. Eyes: No visual changes. ENT: Positive  for sore throat; Negative for difficulty swallowing. Respiratory: Denies shortness of breath. Gastrointestinal: No abdominal pain.  No nausea, no vomiting.  No diarrhea. Genitourinary: Negative for dysuria. Musculoskeletal: Negative for generalized body aches. Skin: Negative for rash. Neurological: Negative for headaches, focal weakness or numbness.  10-point ROS otherwise negative.  ____________________________________________   PHYSICAL EXAM:  VITAL SIGNS: ED Triage Vitals  Enc Vitals Group     BP 06/16/15 1923 130/65 mmHg     Pulse Rate 06/16/15 1923 79     Resp 06/16/15 1923 18     Temp 06/16/15 1923 98.9 F (37.2 C)     Temp src --      SpO2 06/16/15 1923 100 %     Weight 06/16/15 1923 130 lb (58.968 kg)     Height 06/16/15 1923 '5\' 5"'  (1.651 m)     Head Cir --      Peak Flow --      Pain Score 06/16/15 1923 9     Pain Loc --      Pain Edu? --      Excl. in  [place]? --    Constitutional:  Alert and oriented. Well appearing and in no acute distress. Eyes: Conjunctivae are normal. PERRL. EOMI. Head: Atraumatic. Nose: No congestion/rhinnorhea. Mouth/Throat: Mucous membranes are moist.  Oropharynx erythematous, with cobblestone exudate. Neck: No stridor.  Lymphatic: Lymphadenopathy: present--anterior cervical nodes. Cardiovascular: Normal rate, regular rhythm. Good peripheral circulation. Respiratory: Normal respiratory effort. Lungs CTAB. Gastrointestinal: Soft and nontender. Musculoskeletal: No lower extremity tenderness nor edema.   Neurologic:  Normal speech and language. No gross focal neurologic deficits are appreciated. Speech is normal. No gait instability. Skin:  Skin is warm, dry and intact. No rash noted Psychiatric: Mood and affect are normal. Speech and behavior are normal.  ____________________________________________   LABS (all labs ordered are listed, but only abnormal results are displayed)  Labs Reviewed  CULTURE, GROUP A STREP ([facility] ONLY)  MONONUCLEOSIS SCREEN  CBC WITH DIFFERENTIAL/PLATELET  POCT RAPID STREP A   ____________________________________________  EKG   ____________________________________________  RADIOLOGY  Not indicated ____________________________________________   PROCEDURES  Procedure(s) performed: None  Critical Care performed: No  ____________________________________________   INITIAL IMPRESSION / ASSESSMENT AND PLAN / ED COURSE  Pertinent labs & imaging results that were available during my care of the patient were reviewed by me and considered in my medical decision making (see chart for details).  Monospot was negative. Patient was given a prescription for amoxicillin and Tylenol 3. She was instructed to follow up with primary care provider for symptoms that are not improving over the next 48 hours. She was instructed to return to the emergency department for symptoms that change or worsen if she is unable to  schedule an appointment. ____________________________________________   FINAL CLINICAL IMPRESSION(S) / ED DIAGNOSES  Final diagnoses:  Pharyngitis      Victorino Dike, FNP 06/16/15 2318  Joanne Gavel, MD 06/17/15 [phone]

## 2015-06-17 ENCOUNTER — Telehealth: Payer: Self-pay | Admitting: Gastroenterology

## 2015-06-17 NOTE — Telephone Encounter (Signed)
Called pt and informed her that she may pick up her rx. Pt understands.

## 2015-06-18 LAB — CULTURE, GROUP A STREP (THRC)

## 2015-07-04 ENCOUNTER — Emergency Department: Payer: Medicaid Other

## 2015-07-04 ENCOUNTER — Encounter: Payer: Self-pay | Admitting: *Deleted

## 2015-07-04 ENCOUNTER — Emergency Department
Admission: EM | Admit: 2015-07-04 | Discharge: 2015-07-04 | Disposition: A | Discharge status: Home / Self Care | Payer: Medicaid Other | Attending: Student | Admitting: Student

## 2015-07-04 DIAGNOSIS — F17210 Nicotine dependence, cigarettes, uncomplicated: Secondary | ICD-10-CM | POA: Insufficient documentation

## 2015-07-04 DIAGNOSIS — Z79899 Other long term (current) drug therapy: Secondary | ICD-10-CM | POA: Insufficient documentation

## 2015-07-04 DIAGNOSIS — R05 Cough: Secondary | ICD-10-CM | POA: Diagnosis present

## 2015-07-04 DIAGNOSIS — Z794 Long term (current) use of insulin: Secondary | ICD-10-CM | POA: Insufficient documentation

## 2015-07-04 DIAGNOSIS — J0100 Acute maxillary sinusitis, unspecified: Secondary | ICD-10-CM

## 2015-07-04 DIAGNOSIS — E1049 Type 1 diabetes mellitus with other diabetic neurological complication: Secondary | ICD-10-CM | POA: Diagnosis not present

## 2015-07-04 MED ORDER — GUAIFENESIN-CODEINE 100-10 MG/5ML PO SOLN: 10.0000 mL | Freq: Three times a day (TID) | ORAL | Status: DC | PRN

## 2015-07-04 MED ORDER — AMOXICILLIN-POT CLAVULANATE 875-125 MG PO TABS
1.0000 | ORAL_TABLET | Freq: Two times a day (BID) | ORAL | Status: DC
Start: 2015-07-04 — End: 2015-07-25

## 2015-07-04 MED ORDER — ACETAMINOPHEN-CODEINE #3 300-30 MG PO TABS: 1.0000 | ORAL_TABLET | ORAL | Status: DC | PRN

## 2015-07-04 NOTE — ED Notes (Signed)
Patient c/o persistent cough that has last for two weeks. Patient has finished course of Amoxicillin which helped the cough, but cough has now returned and is productive with thick yellow sputum.

## 2015-07-04 NOTE — ED Provider Notes (Signed)
[facility] Emergency Department Provider Note ____________________________________________  Time seen: Approximately 2:02 PM  I have reviewed the triage vital signs and the nursing notes.   HISTORY  Chief Complaint Cough   HPI [name] is a 39 y.o. female who presents to the emergency department for evaluation of bilateral eye redness, sinus pressure and congestion, and cough productive of yellow sputum. She was here at the end of November for a sore throat. At that time she was given amoxicillin. She finished the amoxicillin and had started to feel better, however a few days after finishing it her symptoms returnedand are now worse than before. She states that on her previous visit she did not have a cough, that has developed over the past several days. She has had a subjective fever.   Past Medical History  Diagnosis Date  . DIABETES MELLITUS, TYPE I 02/02/2007  . Anxiety state, unspecified 03/26/2009  . DEPRESSION 02/16/2009  . ASTHMA 02/02/2007  . Shortness of breath 09/25/2007  . GERD 12/21/2008  . Endometriosis     Patient Active Problem List   Diagnosis Date Noted  . Type 1 diabetes mellitus with neurological manifestations, uncontrolled ([place]) 01/27/2014  . Hematuria 03/26/2012  . Myalgia 11/10/2010  . AIRWAY INFLAMMATION 06/08/2010  . Nonspecific (abnormal) findings on radiological and other examination of body structure 05/12/2010  . [place] LUNG FIELD 05/12/2010  . VULVAR ABSCESS 03/18/2010  . UTI 03/09/2010  . INSOMNIA 02/18/2010  . FOLLICULITIS 96/75/9163  . ANXIETY STATE, UNSPECIFIED 03/26/2009  . NAUSEA 02/19/2009  . DEPRESSION 02/16/2009  . GERD 12/21/2008  . ASTHMATIC BRONCHITIS, ACUTE 08/12/2008  . ACUTE MAXILLARY SINUSITIS 05/04/2008  . MOLE 01/29/2008  . Diarrhea 10/16/2007  . SMOKER 09/25/2007  . SHORTNESS OF BREATH 09/25/2007  . ASTHMA 02/02/2007    Past Surgical History  Procedure  Laterality Date  . Abdominal hysterectomy  2009    BSO    Current Outpatient Rx  Name  Route  Sig  Dispense  Refill  . acetaminophen-codeine (TYLENOL #3) 300-30 MG tablet   Oral   Take 1-2 tablets by mouth every 4 (four) hours as needed for moderate pain.   12 tablet   0   . albuterol (PROVENTIL HFA) 108 (90 BASE) MCG/ACT inhaler   Inhalation   Inhale 2 puffs into the lungs every 6 (six) hours as needed. For shortness of breath         . amoxicillin-clavulanate (AUGMENTIN) 875-125 MG tablet   Oral   Take 1 tablet by mouth 2 (two) times daily.   20 tablet   0   . atorvastatin (LIPITOR) 10 MG tablet   Oral   Take 10 mg by mouth at bedtime.         . Blood Glucose Monitoring Suppl (ACCU-CHEK AVIVA PLUS) W/DEVICE KIT   Does not apply   1 Device by Does not apply route once.   1 kit   0   . EXPIRED: Fluticasone-Salmeterol (ADVAIR DISKUS) 250-50 MCG/DOSE AEPB   Inhalation   Inhale 1 puff into the lungs 2 (two) times daily. Inhale 1 puff into the lungs 2 (two) times daily.         Marland Kitchen gabapentin (NEURONTIN) 300 MG capsule      1 po QHS; may increase to 2 po QHS after 1 week if night sweats persist         . glucose blood (ACCU-CHEK AVIVA PLUS) test strip  USE TO TEST BLOOD SUGAR FOUR TIMES DAILY. DX E10.41   125 each   4   . guaiFENesin-codeine 100-10 MG/5ML syrup   Oral   Take 10 mLs by mouth 3 (three) times daily as needed.   120 mL   0   . ibuprofen (ADVIL,MOTRIN) 800 MG tablet   Oral   Take 800 mg by mouth.         . Insulin Glargine (LANTUS SOLOSTAR) 100 UNIT/ML Solostar Pen   Subcutaneous   Inject 5 Units into the skin at bedtime.   5 pen   PRN   . Insulin Pen Needle 31G X 8 MM MISC      Use as directed          . NOVOLOG FLEXPEN 100 UNIT/ML FlexPen      INJECT SUBCUTANEOUSLY THREE TIMES DAILY BEFORE EACH MEAL, 12 UNITS WITH BREAKFAST, 5 UNITS WITH LUNCH AND 6 UNITS WITH SUPPER   15 pen   2   . omeprazole (PRILOSEC) 20 MG capsule    Oral   Take 20 mg by mouth 2 (two) times daily.         Marland Kitchen PARoxetine (PAXIL) 40 MG tablet   Oral   Take 1 tablet (40 mg total) by mouth daily.   30 tablet   0     Appointment needed for further refills   . rifaximin (XIFAXAN) 550 MG TABS tablet   Oral   Take 1 tablet (550 mg total) by mouth 3 (three) times daily.   42 tablet   0   . rOPINIRole (REQUIP) 0.5 MG tablet      TAKE THREE TABLETS BY MOUTH AT BEDTIME   90 tablet   1     Needs appointment for further refills.     Allergies Review of patient's allergies indicates no known allergies.  Family History  Problem Relation Age of Onset  . Diabetes Mother   . Diabetes Maternal Aunt   . Emphysema Maternal Aunt   . Emphysema Maternal Uncle   . Diabetes Maternal Grandfather   . Lung cancer Maternal Aunt   . Colon cancer Neg Hx   . Kidney disease Neg Hx   . Liver disease Neg Hx     Social History Social History  Substance Use Topics  . Smoking status: Current Every Day Smoker -- 1.00 packs/day for 20 years    Types: Cigarettes  . Smokeless tobacco: Never Used     Comment: form given 06/05/14  . Alcohol Use: No    Review of Systems Constitutional: Positive for fever/chills Eyes: No visual changes. ENT: Positive for sore throat. Cardiovascular: Denies chest pain.  Respiratory: Negative for shortness of breath. Positive for cough. Gastrointestinal: Negative for abdominal pain. No nausea,  no vomiting.  No diarrhea.  Genitourinary: Negative for dysuria. Musculoskeletal: No for body aches Skin: Negative for rash. Neurological: Positive for headaches, Negative for focal weakness or numbness.  10-point ROS otherwise negative.  ____________________________________________   PHYSICAL EXAM:  VITAL SIGNS: ED Triage Vitals  Enc Vitals Group     BP 07/04/15 1346 149/86 mmHg     Pulse Rate 07/04/15 1346 94     Resp 07/04/15 1346 18     Temp 07/04/15 1346 98.2 F (36.8 C)     Temp Source 07/04/15 1346  Oral     SpO2 07/04/15 1346 98 %     Weight 07/04/15 1346 130 lb (58.968 kg)     Height 07/04/15 1346 _0  (  1.651 m)     Head Cir --      Peak Flow --      Pain Score 07/04/15 1348 8     Pain Loc --      Pain Edu? --      Excl. in [place]? --     Constitutional: Alert and oriented. Well appearing and in no acute distress. Eyes: Conjunctivae are erythematous. PERRL. EOMI. Ears: TM normal Head: Atraumatic. Nose: Nasal congestion noted with tenderness over the maxillary sinuses greater on the right side. Mouth/Throat: Mucous membranes are moist.  Oropharynx erythematous with cobblestoning appearance. \ Neck: No stridor.  Lymphatic: No cervical lymphadenopathy. Cardiovascular: Normal rate, regular rhythm. Grossly normal heart sounds.  Good peripheral circulation. Respiratory: Normal respiratory effort.  No retractions. Lungs clear to auscultation bilaterally. Gastrointestinal: Soft and nontender. No distention. No abdominal bruits. No CVA tenderness. Musculoskeletal: No joint pain reported. Neurologic:  Normal speech and language. No gross focal neurologic deficits are appreciated. Speech is normal. No gait instability. Skin:  Skin is warm, dry and intact. No rash noted. Psychiatric: Mood and affect are normal. Speech and behavior are normal.  ____________________________________________   LABS (all labs ordered are listed, but only abnormal results are displayed)  Labs Reviewed - No data to display ____________________________________________  EKG   ____________________________________________  RADIOLOGY  Chest x-ray negative for acute cardiopulmonary abnormality. ____________________________________________   PROCEDURES  Procedure(s) performed: None  Critical Care performed: No  ____________________________________________   INITIAL IMPRESSION / ASSESSMENT AND PLAN / ED COURSE  Pertinent labs & imaging results that were available during my care of the patient were  reviewed by me and considered in my medical decision making (see chart for details).   Patient was advised to follow-up with her primary care provider or the ear nose and throat specialist for symptoms that are not improving over the next few days. She was advised to return to the emergency department for symptoms that change or worsen if she is unable schedule an appointment. ____________________________________________   FINAL CLINICAL IMPRESSION(S) / ED DIAGNOSES  Final diagnoses:  Acute maxillary sinusitis, recurrence not specified       Victorino Dike, FNP 07/04/15 Blanchard Gayle, MD 07/04/15 [phone]

## 2015-07-04 NOTE — ED Notes (Signed)
Pt presents after finishing course of amoxicillin 4 days ago. She states that she has continued to cough and that now her throat hurts and she awakened with matted, red eyes this morning. Pt c/o body aches, mostly in her chest, especially when coughing. Pt alert & oriented.

## 2015-07-04 NOTE — Discharge Instructions (Signed)

## 2015-07-04 NOTE — ED Notes (Signed)
Pt discharged home after verbalizing understanding of discharge instructions; nad noted. 

## 2015-07-25 ENCOUNTER — Emergency Department
Admission: EM | Admit: 2015-07-25 | Discharge: 2015-07-25 | Disposition: A | Discharge status: Home / Self Care | Payer: Medicaid Other | Attending: Emergency Medicine | Admitting: Emergency Medicine

## 2015-07-25 ENCOUNTER — Emergency Department: Payer: Medicaid Other

## 2015-07-25 ENCOUNTER — Encounter: Payer: Self-pay | Admitting: Emergency Medicine

## 2015-07-25 DIAGNOSIS — Z794 Long term (current) use of insulin: Secondary | ICD-10-CM | POA: Diagnosis not present

## 2015-07-25 DIAGNOSIS — E042 Nontoxic multinodular goiter: Secondary | ICD-10-CM | POA: Insufficient documentation

## 2015-07-25 DIAGNOSIS — Z7951 Long term (current) use of inhaled steroids: Secondary | ICD-10-CM | POA: Insufficient documentation

## 2015-07-25 DIAGNOSIS — F17210 Nicotine dependence, cigarettes, uncomplicated: Secondary | ICD-10-CM | POA: Diagnosis not present

## 2015-07-25 DIAGNOSIS — Z79899 Other long term (current) drug therapy: Secondary | ICD-10-CM | POA: Insufficient documentation

## 2015-07-25 DIAGNOSIS — Z792 Long term (current) use of antibiotics: Secondary | ICD-10-CM | POA: Diagnosis not present

## 2015-07-25 DIAGNOSIS — E1049 Type 1 diabetes mellitus with other diabetic neurological complication: Secondary | ICD-10-CM | POA: Diagnosis not present

## 2015-07-25 DIAGNOSIS — J029 Acute pharyngitis, unspecified: Secondary | ICD-10-CM | POA: Diagnosis present

## 2015-07-25 LAB — CBC WITH DIFFERENTIAL/PLATELET
BASOS ABS: 0.2 10*3/uL — AB (ref 0–0.1)
BASOS PCT: 2 %
EOS ABS: 0.1 10*3/uL (ref 0–0.7)
Eosinophils Relative: 1 %
HEMATOCRIT: 44.9 % (ref 35.0–47.0)
HEMOGLOBIN: 15.1 g/dL (ref 12.0–16.0)
Lymphocytes Relative: 8 %
Lymphs Abs: 0.8 10*3/uL — ABNORMAL LOW (ref 1.0–3.6)
MCH: 30.2 pg (ref 26.0–34.0)
MCHC: 33.6 g/dL (ref 32.0–36.0)
MCV: 89.8 fL (ref 80.0–100.0)
MONO ABS: 0.5 10*3/uL (ref 0.2–0.9)
Monocytes Relative: 5 %
NEUTROS ABS: 8.3 10*3/uL — AB (ref 1.4–6.5)
NEUTROS PCT: 84 %
PLATELETS: 201 10*3/uL (ref 150–440)
RBC: 5 MIL/uL (ref 3.80–5.20)
RDW: 13.3 % (ref 11.5–14.5)
WBC: 9.9 10*3/uL (ref 3.6–11.0)

## 2015-07-25 LAB — BASIC METABOLIC PANEL
ANION GAP: 10 (ref 5–15)
BUN: 10 mg/dL (ref 6–20)
CALCIUM: 9.5 mg/dL (ref 8.9–10.3)
CO2: 23 mmol/L (ref 22–32)
Chloride: 95 mmol/L — ABNORMAL LOW (ref 101–111)
Creatinine, Ser: 0.66 mg/dL (ref 0.44–1.00)
Glucose, Bld: 329 mg/dL — ABNORMAL HIGH (ref 65–99)
Potassium: 3.5 mmol/L (ref 3.5–5.1)
Sodium: 128 mmol/L — ABNORMAL LOW (ref 135–145)

## 2015-07-25 LAB — POCT RAPID STREP A: STREPTOCOCCUS, GROUP A SCREEN (DIRECT): NEGATIVE

## 2015-07-25 MED ORDER — HYDROCODONE-ACETAMINOPHEN 5-325 MG PO TABS: 1.0000 | ORAL_TABLET | ORAL | Status: DC | PRN

## 2015-07-25 MED ORDER — PENTAFLUOROPROP-TETRAFLUOROETH EX AERO
INHALATION_SPRAY | CUTANEOUS | Status: AC
  Filled 2015-07-25: qty 30

## 2015-07-25 MED ORDER — IOHEXOL 300 MG/ML  SOLN
75.0000 mL | Freq: Once | INTRAMUSCULAR | Status: AC | PRN
  Administered 2015-07-25: 75 mL via INTRAVENOUS
  Filled 2015-07-25: qty 75

## 2015-07-25 NOTE — ED Provider Notes (Signed)
[facility] Emergency Department Provider Note  ____________________________________________  Time seen: Approximately 2:41 PM  I have reviewed the triage vital signs and the nursing notes.   HISTORY  Chief Complaint Sore Throat    HPI [name] is a 40 y.o. female presents with a sore throat 3 days with pain and swelling. Symptoms worsen. Patient states that she's been on several antibiotics recently and Augmentinwas the last prescribed on 07/04/2015.   Past Medical History  Diagnosis Date  . DIABETES MELLITUS, TYPE I 02/02/2007  . Anxiety state, unspecified 03/26/2009  . DEPRESSION 02/16/2009  . ASTHMA 02/02/2007  . Shortness of breath 09/25/2007  . GERD 12/21/2008  . Endometriosis     Patient Active Problem List   Diagnosis Date Noted  . Type 1 diabetes mellitus with neurological manifestations, uncontrolled ([place]) 01/27/2014  . Hematuria 03/26/2012  . Myalgia 11/10/2010  . AIRWAY INFLAMMATION 06/08/2010  . Nonspecific (abnormal) findings on radiological and other examination of body structure 05/12/2010  . [place] LUNG FIELD 05/12/2010  . VULVAR ABSCESS 03/18/2010  . UTI 03/09/2010  . INSOMNIA 02/18/2010  . FOLLICULITIS 78/29/5621  . ANXIETY STATE, UNSPECIFIED 03/26/2009  . NAUSEA 02/19/2009  . DEPRESSION 02/16/2009  . GERD 12/21/2008  . ASTHMATIC BRONCHITIS, ACUTE 08/12/2008  . ACUTE MAXILLARY SINUSITIS 05/04/2008  . MOLE 01/29/2008  . Diarrhea 10/16/2007  . SMOKER 09/25/2007  . SHORTNESS OF BREATH 09/25/2007  . ASTHMA 02/02/2007    Past Surgical History  Procedure Laterality Date  . Abdominal hysterectomy  2009    BSO    Current Outpatient Rx  Name  Route  Sig  Dispense  Refill  . acetaminophen-codeine (TYLENOL #3) 300-30 MG tablet   Oral   Take 1-2 tablets by mouth every 4 (four) hours as needed for moderate pain.   12 tablet   0   . albuterol (PROVENTIL HFA) 108 (90 BASE) MCG/ACT  inhaler   Inhalation   Inhale 2 puffs into the lungs every 6 (six) hours as needed. For shortness of breath         . atorvastatin (LIPITOR) 10 MG tablet   Oral   Take 10 mg by mouth at bedtime.         . Blood Glucose Monitoring Suppl (ACCU-CHEK AVIVA PLUS) W/DEVICE KIT   Does not apply   1 Device by Does not apply route once.   1 kit   0   . EXPIRED: Fluticasone-Salmeterol (ADVAIR DISKUS) 250-50 MCG/DOSE AEPB   Inhalation   Inhale 1 puff into the lungs 2 (two) times daily. Inhale 1 puff into the lungs 2 (two) times daily.         Marland Kitchen gabapentin (NEURONTIN) 300 MG capsule      1 po QHS; may increase to 2 po QHS after 1 week if night sweats persist         . glucose blood (ACCU-CHEK AVIVA PLUS) test strip      USE TO TEST BLOOD SUGAR FOUR TIMES DAILY. DX E10.41   125 each   4   . HYDROcodone-acetaminophen (NORCO) 5-325 MG tablet   Oral   Take 1-2 tablets by mouth every 4 (four) hours as needed for moderate pain.   15 tablet   0   . ibuprofen (ADVIL,MOTRIN) 800 MG tablet   Oral   Take 800 mg by mouth.         . Insulin Glargine (LANTUS SOLOSTAR) 100 UNIT/ML Solostar Pen  Subcutaneous   Inject 5 Units into the skin at bedtime.   5 pen   PRN   . Insulin Pen Needle 31G X 8 MM MISC      Use as directed          . NOVOLOG FLEXPEN 100 UNIT/ML FlexPen      INJECT SUBCUTANEOUSLY THREE TIMES DAILY BEFORE EACH MEAL, 12 UNITS WITH BREAKFAST, 5 UNITS WITH LUNCH AND 6 UNITS WITH SUPPER   15 pen   2   . omeprazole (PRILOSEC) 20 MG capsule   Oral   Take 20 mg by mouth 2 (two) times daily.         Marland Kitchen PARoxetine (PAXIL) 40 MG tablet   Oral   Take 1 tablet (40 mg total) by mouth daily.   30 tablet   0     Appointment needed for further refills   . rifaximin (XIFAXAN) 550 MG TABS tablet   Oral   Take 1 tablet (550 mg total) by mouth 3 (three) times daily.   42 tablet   0   . rOPINIRole (REQUIP) 0.5 MG tablet      TAKE THREE TABLETS BY MOUTH AT  BEDTIME   90 tablet   1     Needs appointment for further refills.     Allergies Review of patient's allergies indicates no known allergies.  Family History  Problem Relation Age of Onset  . Diabetes Mother   . Diabetes Maternal Aunt   . Emphysema Maternal Aunt   . Emphysema Maternal Uncle   . Diabetes Maternal Grandfather   . Lung cancer Maternal Aunt   . Colon cancer Neg Hx   . Kidney disease Neg Hx   . Liver disease Neg Hx     Social History Social History  Substance Use Topics  . Smoking status: Current Every Day Smoker -- 1.00 packs/day for 20 years    Types: Cigarettes  . Smokeless tobacco: Never Used     Comment: form given 06/05/14  . Alcohol Use: No    Review of Systems Constitutional: No fever/chills Eyes: No visual changes. ENT: Positive for neck pain and sore throat Cardiovascular: Denies chest pain. Respiratory: Denies shortness of breath. Gastrointestinal: No abdominal pain.  No nausea, no vomiting.  No diarrhea.  No constipation. Genitourinary: Negative for dysuria. Musculoskeletal: Negative for back pain. Skin: Negative for rash. Neurological: Negative for headaches, focal weakness or numbness.  10-point ROS otherwise negative.  ____________________________________________   PHYSICAL EXAM:  VITAL SIGNS: ED Triage Vitals  Enc Vitals Group     BP 07/25/15 1348 170/95 mmHg     Pulse Rate 07/25/15 1348 112     Resp 07/25/15 1348 18     Temp 07/25/15 1348 97.6 F (36.4 C)     Temp Source 07/25/15 1348 Oral     SpO2 07/25/15 1348 98 %     Weight 07/25/15 1348 130 lb (58.968 kg)     Height 07/25/15 1348 '5\' 5"'  (1.651 m)     Head Cir --      Peak Flow --      Pain Score 07/25/15 1350 7     Pain Loc --      Pain Edu? --      Excl. in [place]? --     Constitutional: Alert and oriented. Well appearing and in no acute distress. Eyes: Conjunctivae are normal. PERRL. EOMI. Head: Atraumatic. Nose: No congestion/rhinnorhea. Mouth/Throat: Mucous  membranes are moist.  Oropharynx non-erythematous. Neck: No stridor.  Positive for cervical adenopathy. Tender to the right inferior chain Cardiovascular: Normal rate, regular rhythm. Grossly normal heart sounds.  Good peripheral circulation. Respiratory: Normal respiratory effort.  No retractions. Lungs CTAB. Musculoskeletal: No lower extremity tenderness nor edema.  No joint effusions. Neurologic:  Normal speech and language. No gross focal neurologic deficits are appreciated. No gait instability. Skin:  Skin is warm, dry and intact. No rash noted. Psychiatric: Mood and affect are normal. Speech and behavior are normal.  ____________________________________________   LABS (all labs ordered are listed, but only abnormal results are displayed)  Labs Reviewed  BASIC METABOLIC PANEL - Abnormal; Notable for the following:    Sodium 128 (*)    Chloride 95 (*)    Glucose, Bld 329 (*)    All other components within normal limits  CBC WITH DIFFERENTIAL/PLATELET - Abnormal; Notable for the following:    Neutro Abs 8.3 (*)    Lymphs Abs 0.8 (*)    Basophils Absolute 0.2 (*)    All other components within normal limits  POCT RAPID STREP A   RADIOLOGY   IMPRESSION: No acute findings. Small thyroid nodules, 1 of which shows some complexity. Nonemergent thyroid ultrasound suggested  PROCEDURES  Procedure(s) performed: None  Critical Care performed: No  ____________________________________________   INITIAL IMPRESSION / ASSESSMENT AND PLAN / ED COURSE  Pertinent labs & imaging results that were available during my care of the patient were reviewed by me and considered in my medical decision making (see chart for details).  Thyroid nodules. Soft tissue pain in the right neck peripheral given the ENT doctor on call. Patient follow-up with their or PCP. Discussed elevated blood sugars and need for referral back to an  endocrinologist. ____________________________________________   FINAL CLINICAL IMPRESSION(S) / ED DIAGNOSES  Final diagnoses:  Multiple thyroid nodules      Arlyss Repress, PA-C 07/25/15 1713  Eula Listen, MD 07/28/15 0006

## 2015-07-25 NOTE — ED Notes (Signed)
Sore throat x 3 days, pain with swallowing, worse at night

## 2015-07-25 NOTE — ED Notes (Signed)
Negative strep

## 2015-07-25 NOTE — ED Notes (Signed)
AAOx3.  Skin warm and dry.  NAD 

## 2015-07-29 ENCOUNTER — Ambulatory Visit (INDEPENDENT_AMBULATORY_CARE_PROVIDER_SITE_OTHER): Payer: Medicaid Other | Admitting: Endocrinology

## 2015-07-29 ENCOUNTER — Encounter: Payer: Self-pay | Admitting: Endocrinology

## 2015-07-29 VITALS — BP 132/64 | HR 123 | Temp 98.7°F | Ht 65.0 in | Wt 130.0 lb

## 2015-07-29 DIAGNOSIS — E1040 Type 1 diabetes mellitus with diabetic neuropathy, unspecified: Secondary | ICD-10-CM | POA: Diagnosis not present

## 2015-07-29 DIAGNOSIS — E1065 Type 1 diabetes mellitus with hyperglycemia: Secondary | ICD-10-CM | POA: Diagnosis not present

## 2015-07-29 DIAGNOSIS — E042 Nontoxic multinodular goiter: Secondary | ICD-10-CM | POA: Diagnosis not present

## 2015-07-29 DIAGNOSIS — IMO0002 Reserved for concepts with insufficient information to code with codable children: Secondary | ICD-10-CM

## 2015-07-29 LAB — BASIC METABOLIC PANEL
BUN: 4 mg/dL — ABNORMAL LOW (ref 6–23)
CHLORIDE: 97 meq/L (ref 96–112)
CO2: 25 meq/L (ref 19–32)
Calcium: 9.2 mg/dL (ref 8.4–10.5)
Creatinine, Ser: 0.61 mg/dL (ref 0.40–1.20)
GFR: 115.88 mL/min (ref >60.00)
GLUCOSE: 322 mg/dL — AB (ref 70–99)
POTASSIUM: 3.8 meq/L (ref 3.5–5.1)
SODIUM: 130 meq/L — AB (ref 135–145)

## 2015-07-29 LAB — TSH: TSH: 1.21 u[IU]/mL (ref 0.35–4.50)

## 2015-07-29 LAB — POCT GLYCOSYLATED HEMOGLOBIN (HGB A1C): Hemoglobin A1C: 9.3

## 2015-07-29 MED ORDER — GLUCAGON (RDNA) 1 MG IJ KIT: 1.0000 mg | PACK | Freq: Once | INTRAMUSCULAR | Status: DC | PRN

## 2015-07-29 MED ORDER — GLUCOSE BLOOD VI STRP: ORAL_STRIP | Status: DC

## 2015-07-29 MED ORDER — INSULIN ASPART 100 UNIT/ML FLEXPEN: PEN_INJECTOR | SUBCUTANEOUS | Status: DC

## 2015-07-29 MED ORDER — INSULIN GLARGINE 100 UNIT/ML SOLOSTAR PEN: 5.0000 [IU] | PEN_INJECTOR | Freq: Every day | SUBCUTANEOUS | Status: DC

## 2015-07-29 NOTE — Patient Instructions (Addendum)
check your blood sugar 7 times a day--before the 3 meals, and at bedtime.  also check if you have symptoms of your blood sugar being too high or too low.  please keep a record of the readings and bring it to your next appointment here.  please call us sooner if your blood sugar goes below 70, or if you have a lot of readings over 200.   Please increase the same insulin shots to the amounts listed below.   i have sent a prescription to your pharmacy, to increase the lantus also.    Please come back for a follow-up appointment in 2 weeks.  Please see Vaughan Basta the same day.   Please reduce the novolog by 2 units if you are going to be active.  If you are unable to anticipate the activity, eat a few crackers with it.   i have sent a prescription to your pharmacy, for a medication called "glucagon."  This is a single-use emergency kit.  It will help you when your blood sugar is so low, you need someone else to help you.  The side-effect is nausea, so you should be turned on your side after getting this shot.   Let's check the thyroid ultrasound.  you will receive a phone call, about a day and time for an appointment.

## 2015-07-29 NOTE — Progress Notes (Signed)
Subjective:    Patient ID: [name], female    DOB: [birth year], 40 y.o.   MRN: [id number]  HPI Pt returns for f/u of diabetes mellitus: DM type: 1 Dx'ed: 4098 Complications: poly neuropathy and retinopathy. Therapy: insulin since dx GDM: never DKA: never Severe hypoglycemia: never Pancreatitis: never Other: she takes multiple daily injections; therapy has been limited by chronic noncompliance; she has had TAH.   Interval history: no cbg record, but states cbg's vary from 220-340.  It is highest at lunch, but there is otherwise no trend throughout the day.There is no trend throughout the day.  Pt says she does not miss the insulin. She denies hypoglycemia.  She still often takes less than the prescribed insulin dosage.  Past Medical History  Diagnosis Date  . DIABETES MELLITUS, TYPE I 02/02/2007  . Anxiety state, unspecified 03/26/2009  . DEPRESSION 02/16/2009  . ASTHMA 02/02/2007  . Shortness of breath 09/25/2007  . GERD 12/21/2008  . Endometriosis     Past Surgical History  Procedure Laterality Date  . Abdominal hysterectomy  2009    BSO    Social History   Social History  . Marital Status: Legally Separated    Spouse Name: N/A  . Number of Children: 2  . Years of Education: N/A   Occupational History  .     Social History Main Topics  . Smoking status: Current Every Day Smoker -- 1.00 packs/day for 20 years    Types: Cigarettes  . Smokeless tobacco: Never Used     Comment: form given 06/05/14  . Alcohol Use: No  . Drug Use: No  . Sexual Activity: Not on file   Other Topics Concern  . Not on file   Social History Narrative   2 children--pt has custody. Husband pays child support.   Daily Caffeine Use-3 2 liters a day   Pt does not get regular exercise.    Current Outpatient Prescriptions on File Prior to Visit  Medication Sig Dispense Refill  . acetaminophen-codeine (TYLENOL #3) 300-30 MG tablet Take 1-2 tablets by mouth every 4 (four) hours as  needed for moderate pain. 12 tablet 0  . albuterol (PROVENTIL HFA) 108 (90 BASE) MCG/ACT inhaler Inhale 2 puffs into the lungs every 6 (six) hours as needed. For shortness of breath    . atorvastatin (LIPITOR) 10 MG tablet Take 10 mg by mouth at bedtime.    . Blood Glucose Monitoring Suppl (ACCU-CHEK AVIVA PLUS) W/DEVICE KIT 1 Device by Does not apply route once. 1 kit 0  . gabapentin (NEURONTIN) 300 MG capsule 1 po QHS; may increase to 2 po QHS after 1 week if night sweats persist    . HYDROcodone-acetaminophen (NORCO) 5-325 MG tablet Take 1-2 tablets by mouth every 4 (four) hours as needed for moderate pain. 15 tablet 0  . ibuprofen (ADVIL,MOTRIN) 800 MG tablet Take 800 mg by mouth.    . Insulin Pen Needle 31G X 8 MM MISC Use as directed     . omeprazole (PRILOSEC) 20 MG capsule Take 20 mg by mouth 2 (two) times daily.    Marland Kitchen PARoxetine (PAXIL) 40 MG tablet Take 1 tablet (40 mg total) by mouth daily. 30 tablet 0  . rifaximin (XIFAXAN) 550 MG TABS tablet Take 1 tablet (550 mg total) by mouth 3 (three) times daily. 42 tablet 0  . rOPINIRole (REQUIP) 0.5 MG tablet TAKE THREE TABLETS BY MOUTH AT BEDTIME 90 tablet 1  . Fluticasone-Salmeterol (ADVAIR DISKUS) 250-50 MCG/DOSE  AEPB Inhale 1 puff into the lungs 2 (two) times daily. Inhale 1 puff into the lungs 2 (two) times daily.     No current facility-administered medications on file prior to visit.    No Known Allergies  Family History  Problem Relation Age of Onset  . Diabetes Mother   . Diabetes Maternal Aunt   . Emphysema Maternal Aunt   . Emphysema Maternal Uncle   . Diabetes Maternal Grandfather   . Lung cancer Maternal Aunt   . Colon cancer Neg Hx   . Kidney disease Neg Hx   . Liver disease Neg Hx     BP 132/64 mmHg  Pulse 123  Temp(Src) 98.7 F (37.1 C) (Oral)  Ht _0  (1.651 m)  Wt 130 lb (58.968 kg)  BMI 21.63 kg/m2  SpO2 97%  Review of Systems She was seen in ER for neck pain, but that is better now.  Denies neck  swelling.     Objective:   Physical Exam VITAL SIGNS:  See vs page GENERAL: no distress NECK: There is no palpable thyroid enlargement.  No thyroid nodule is palpable, but the surface is irregular.   Nodes: no palpable lymphadenopathy at the anterior neck. Pulses: dorsalis pedis intact bilat.   MSK: no deformity of the feet CV: no leg edema Skin:  no ulcer on the feet.  normal color and temp on the feet. Neuro: sensation is intact to touch on the feet    A1c=9.3%    Assessment & Plan:  DM: she needs increased rx.  Multinodular goiter, new.   Patient is advised the following: Patient Instructions  check your blood sugar 7 times a day--before the 3 meals, and at bedtime.  also check if you have symptoms of your blood sugar being too high or too low.  please keep a record of the readings and bring it to your next appointment here.  please call us sooner if your blood sugar goes below 70, or if you have a lot of readings over 200.   Please increase the same insulin shots to the amounts listed below.   i have sent a prescription to your pharmacy, to increase the lantus also.    Please come back for a follow-up appointment in 2 weeks.  Please see Vaughan Basta the same day.   Please reduce the novolog by 2 units if you are going to be active.  If you are unable to anticipate the activity, eat a few crackers with it.   i have sent a prescription to your pharmacy, for a medication called "glucagon."  This is a single-use emergency kit.  It will help you when your blood sugar is so low, you need someone else to help you.  The side-effect is nausea, so you should be turned on your side after getting this shot.   Let's check the thyroid ultrasound.  you will receive a phone call, about a day and time for an appointment.

## 2015-07-30 ENCOUNTER — Other Ambulatory Visit: Payer: Self-pay

## 2015-07-30 ENCOUNTER — Telehealth: Payer: Self-pay | Admitting: Endocrinology

## 2015-07-30 MED ORDER — INSULIN LISPRO 100 UNIT/ML (KWIKPEN): PEN_INJECTOR | SUBCUTANEOUS | Status: DC

## 2015-07-30 MED ORDER — INSULIN DETEMIR 100 UNIT/ML FLEXPEN: 6.0000 [IU] | PEN_INJECTOR | Freq: Every day | SUBCUTANEOUS | Status: DC

## 2015-07-30 NOTE — Telephone Encounter (Signed)
please call patient: Ins prefers levemir instead of lantus. i have sent a prescription to your pharmacy It is not a unit-for-unit conversion, so it is 6 units qhs

## 2015-07-30 NOTE — Telephone Encounter (Signed)
I contacted the pt and advised of note below pt voiced understanding.

## 2015-08-05 ENCOUNTER — Ambulatory Visit
Admission: RE | Admit: 2015-08-05 | Discharge: 2015-08-05 | Disposition: A | Discharge status: Home / Self Care | Payer: Medicaid Other | Source: Ambulatory Visit | Attending: Endocrinology | Admitting: Endocrinology

## 2015-08-05 ENCOUNTER — Other Ambulatory Visit: Payer: Self-pay | Admitting: Endocrinology

## 2015-08-05 DIAGNOSIS — E042 Nontoxic multinodular goiter: Secondary | ICD-10-CM

## 2015-08-10 ENCOUNTER — Encounter: Payer: Self-pay | Admitting: Endocrinology

## 2015-08-10 ENCOUNTER — Ambulatory Visit (INDEPENDENT_AMBULATORY_CARE_PROVIDER_SITE_OTHER): Payer: Medicaid Other | Admitting: Endocrinology

## 2015-08-10 ENCOUNTER — Encounter: Payer: Medicaid Other | Attending: Endocrinology | Admitting: Nutrition

## 2015-08-10 ENCOUNTER — Other Ambulatory Visit: Payer: Self-pay

## 2015-08-10 VITALS — BP 122/82 | HR 86 | Temp 98.9°F | Wt 131.0 lb

## 2015-08-10 DIAGNOSIS — E1065 Type 1 diabetes mellitus with hyperglycemia: Principal | ICD-10-CM

## 2015-08-10 DIAGNOSIS — Z713 Dietary counseling and surveillance: Secondary | ICD-10-CM | POA: Diagnosis not present

## 2015-08-10 DIAGNOSIS — E1040 Type 1 diabetes mellitus with diabetic neuropathy, unspecified: Secondary | ICD-10-CM

## 2015-08-10 DIAGNOSIS — IMO0002 Reserved for concepts with insufficient information to code with codable children: Secondary | ICD-10-CM

## 2015-08-10 DIAGNOSIS — E108 Type 1 diabetes mellitus with unspecified complications: Secondary | ICD-10-CM

## 2015-08-10 MED ORDER — INSULIN LISPRO 100 UNIT/ML (KWIKPEN): PEN_INJECTOR | SUBCUTANEOUS | Status: DC

## 2015-08-10 NOTE — Patient Instructions (Signed)
Make an appt.  To see the dietitian to review carb counting. Make a lab appt. To have a C-peptide and FBS done. Call when pump come in.

## 2015-08-10 NOTE — Progress Notes (Signed)
Patient reports that she is interested in insulin pump therapy.  We discussed the advantages and disadvantages of insulin pump therapy.  She reports that she knows how to count carbs, but is not doing it now.  She is giving a set dose of premeal insulin with some small adjustment for high blood sugars or larger/smaller meal sizes.  She was told that she will need to make an appt. With the dietitian to review this before starting a pump.  We also discussed how pumps work and how she will use it.  She reported good understanding of this.  She was shown the different pumps and we discussed the advantages and disadvantages of each model.  She chose the [facility] pump with CGM.  She filled out paperwork and I faxed it in.  She was told that we need a lab test and this was ordered for her.  She will decide on a date/time to come in for this.   She had no final questions.

## 2015-08-10 NOTE — Progress Notes (Signed)
Subjective:    Patient ID: [name], female    DOB: 11-Mar-1976, 40 y.o.   MRN: [id number]  HPI Pt returns for f/u of diabetes mellitus: DM type: 1 Dx'ed: 1540 Complications: poly neuropathy and retinopathy. Therapy: insulin since dx GDM: never DKA: never Severe hypoglycemia: never Pancreatitis: never Other: she takes multiple daily injections; therapy has been limited by chronic noncompliance; she has had TAH.   Interval history: no cbg record, but states cbg's are generally in the high-100's.  it is lowest at hs, and highest at lunch and in the afternoon.  She saw Vaughan Basta today, and is considering pump rx.   Past Medical History  Diagnosis Date  . DIABETES MELLITUS, TYPE I 02/02/2007  . Anxiety state, unspecified 03/26/2009  . DEPRESSION 02/16/2009  . ASTHMA 02/02/2007  . Shortness of breath 09/25/2007  . GERD 12/21/2008  . Endometriosis     Past Surgical History  Procedure Laterality Date  . Abdominal hysterectomy  2009    BSO    Social History   Social History  . Marital Status: Legally Separated    Spouse Name: N/A  . Number of Children: 2  . Years of Education: N/A   Occupational History  .     Social History Main Topics  . Smoking status: Current Every Day Smoker -- 1.00 packs/day for 20 years    Types: Cigarettes  . Smokeless tobacco: Never Used     Comment: form given 06/05/14  . Alcohol Use: No  . Drug Use: No  . Sexual Activity: Not on file   Other Topics Concern  . Not on file   Social History Narrative   2 children--pt has custody. Husband pays child support.   Daily Caffeine Use-3 2 liters a day   Pt does not get regular exercise.    Current Outpatient Prescriptions on File Prior to Visit  Medication Sig Dispense Refill  . acetaminophen-codeine (TYLENOL #3) 300-30 MG tablet Take 1-2 tablets by mouth every 4 (four) hours as needed for moderate pain. 12 tablet 0  . albuterol (PROVENTIL HFA) 108 (90 BASE) MCG/ACT inhaler Inhale 2 puffs into  the lungs every 6 (six) hours as needed. For shortness of breath    . atorvastatin (LIPITOR) 10 MG tablet Take 10 mg by mouth at bedtime.    . Blood Glucose Monitoring Suppl (ACCU-CHEK AVIVA PLUS) W/DEVICE KIT 1 Device by Does not apply route once. 1 kit 0  . gabapentin (NEURONTIN) 300 MG capsule 1 po QHS; may increase to 2 po QHS after 1 week if night sweats persist    . glucagon 1 MG injection Inject 1 mg into the muscle once as needed. 1 each 12  . glucose blood (ACCU-CHEK AVIVA PLUS) test strip USE TO TEST BLOOD SUGAR 7 TIMES DAILY, and lancets 7/day. DX E10.41 250 each 11  . HYDROcodone-acetaminophen (NORCO) 5-325 MG tablet Take 1-2 tablets by mouth every 4 (four) hours as needed for moderate pain. 15 tablet 0  . ibuprofen (ADVIL,MOTRIN) 800 MG tablet Take 800 mg by mouth.    . Insulin Detemir (LEVEMIR FLEXTOUCH) 100 UNIT/ML Pen Inject 6 Units into the skin at bedtime. 15 mL 11  . Insulin Pen Needle 31G X 8 MM MISC Use as directed     . omeprazole (PRILOSEC) 20 MG capsule Take 20 mg by mouth 2 (two) times daily.    Marland Kitchen PARoxetine (PAXIL) 40 MG tablet Take 1 tablet (40 mg total) by mouth daily. 30 tablet 0  .  rifaximin (XIFAXAN) 550 MG TABS tablet Take 1 tablet (550 mg total) by mouth 3 (three) times daily. 42 tablet 0  . rOPINIRole (REQUIP) 0.5 MG tablet TAKE THREE TABLETS BY MOUTH AT BEDTIME 90 tablet 1  . Fluticasone-Salmeterol (ADVAIR DISKUS) 250-50 MCG/DOSE AEPB Inhale 1 puff into the lungs 2 (two) times daily. Inhale 1 puff into the lungs 2 (two) times daily.     No current facility-administered medications on file prior to visit.    No Known Allergies  Family History  Problem Relation Age of Onset  . Diabetes Mother   . Diabetes Maternal Aunt   . Emphysema Maternal Aunt   . Emphysema Maternal Uncle   . Diabetes Maternal Grandfather   . Lung cancer Maternal Aunt   . Colon cancer Neg Hx   . Kidney disease Neg Hx   . Liver disease Neg Hx     BP 122/82 mmHg  Pulse 86   Temp(Src) 98.9 F (37.2 C) (Oral)  Wt 131 lb (59.421 kg)  SpO2 95%  Review of Systems She denies hypoglycemia.      Objective:   Physical Exam VITAL SIGNS:  See vs page GENERAL: no distress Pulses: dorsalis pedis intact bilat.   MSK: no deformity of the feet CV: no leg edema Skin:  no ulcer on the feet.  normal color and temp on the feet. Neuro: sensation is intact to touch on the feet  Lab Results  Component Value Date   HGBA1C 9.3 07/29/2015      Assessment & Plan:  DM: Based on the pattern of her cbg's, she needs some adjustment in her therapy.    Patient is advised the following: Patient Instructions  check your blood sugar 7 times a day--before the 3 meals, and at bedtime.  also check if you have symptoms of your blood sugar being too high or too low.  please keep a record of the readings and bring it to your next appointment here.  please call us sooner if your blood sugar goes below 70, or if you have a lot of readings over 200.   Please change the insulin shots to the amounts listed below.    Please come back for a follow-up appointment in 2 months.   If you go on the pump sooner, please come back here 2-3 days after starting the pump.   Please reduce the novolog by 2 units if you are going to be active.  If you are unable to anticipate the activity, eat a few crackers with it.

## 2015-08-10 NOTE — Patient Instructions (Addendum)
check your blood sugar 7 times a day--before the 3 meals, and at bedtime.  also check if you have symptoms of your blood sugar being too high or too low.  please keep a record of the readings and bring it to your next appointment here.  please call us sooner if your blood sugar goes below 70, or if you have a lot of readings over 200.   Please change the insulin shots to the amounts listed below.    Please come back for a follow-up appointment in 2 months.   If you go on the pump sooner, please come back here 2-3 days after starting the pump.   Please reduce the novolog by 2 units if you are going to be active.  If you are unable to anticipate the activity, eat a few crackers with it.

## 2015-08-12 ENCOUNTER — Other Ambulatory Visit: Payer: Self-pay | Admitting: Endocrinology

## 2015-08-12 DIAGNOSIS — E1040 Type 1 diabetes mellitus with diabetic neuropathy, unspecified: Secondary | ICD-10-CM

## 2015-08-12 DIAGNOSIS — E042 Nontoxic multinodular goiter: Secondary | ICD-10-CM

## 2015-08-12 DIAGNOSIS — IMO0002 Reserved for concepts with insufficient information to code with codable children: Secondary | ICD-10-CM

## 2015-08-12 DIAGNOSIS — E1065 Type 1 diabetes mellitus with hyperglycemia: Principal | ICD-10-CM

## 2015-08-13 ENCOUNTER — Other Ambulatory Visit: Payer: Medicaid Other

## 2015-08-31 ENCOUNTER — Telehealth: Payer: Self-pay | Admitting: Endocrinology

## 2015-08-31 NOTE — Telephone Encounter (Signed)
Left a voicemail advising of note below. Requested a call back if the pt would like to discuss.  

## 2015-08-31 NOTE — Telephone Encounter (Signed)
See note below and please advise, Thanks! 

## 2015-08-31 NOTE — Telephone Encounter (Signed)
Patient called stating that she is having her thyroid biopsy tomorrow and is very nervous   Can we call her something in ?  Pharmacy: [facility]   Thank you

## 2015-08-31 NOTE — Telephone Encounter (Signed)
Please don't worry.  It is very easy! Most women rate it as the same as a woman's pap smear.

## 2015-09-01 ENCOUNTER — Other Ambulatory Visit (HOSPITAL_COMMUNITY)
Admission: RE | Admit: 2015-09-01 | Discharge: 2015-09-01 | Disposition: A | Discharge status: Home / Self Care | Payer: Medicaid Other | Source: Ambulatory Visit | Attending: Physician Assistant | Admitting: Physician Assistant

## 2015-09-01 ENCOUNTER — Ambulatory Visit
Admission: RE | Admit: 2015-09-01 | Discharge: 2015-09-01 | Disposition: A | Discharge status: Home / Self Care | Payer: Medicaid Other | Source: Ambulatory Visit | Attending: Endocrinology | Admitting: Endocrinology

## 2015-09-01 DIAGNOSIS — E041 Nontoxic single thyroid nodule: Secondary | ICD-10-CM | POA: Diagnosis not present

## 2015-09-01 DIAGNOSIS — E042 Nontoxic multinodular goiter: Secondary | ICD-10-CM

## 2015-09-01 NOTE — Procedures (Signed)
Using direct ultrasound guidance, 3 passes were made using needles into each of the nodules within the left lobe of the thyroid.   Ultrasound was used to confirm needle placements on all occasions.   Specimens were sent to Pathology for analysis.  Maryalice Pasley S Elijiah Mickley PA-C 09/01/2015 10:33 AM

## 2015-09-22 ENCOUNTER — Other Ambulatory Visit: Payer: Medicaid Other

## 2015-09-22 ENCOUNTER — Telehealth: Payer: Self-pay | Admitting: Nutrition

## 2015-09-22 NOTE — Telephone Encounter (Signed)
[name] was in the lab this AM to have her C-peptide drawn.  She says that her FBSs are still in the 200s-300s.This AM it was 224 at 6AM.  When she came into the lab (10AM), it was 292 fasting.  We were not able to draw her C-peptide.  She is taking Levemir 4u at HS.   I checked her last note from Dr. [name], and it says she should be taking 6u.  I told her this and she agreed to increase the dose.

## 2015-10-08 ENCOUNTER — Ambulatory Visit: Payer: Medicaid Other | Admitting: Endocrinology

## 2015-10-08 ENCOUNTER — Other Ambulatory Visit (INDEPENDENT_AMBULATORY_CARE_PROVIDER_SITE_OTHER): Payer: Medicaid Other

## 2015-10-08 DIAGNOSIS — IMO0002 Reserved for concepts with insufficient information to code with codable children: Secondary | ICD-10-CM

## 2015-10-08 DIAGNOSIS — E1065 Type 1 diabetes mellitus with hyperglycemia: Secondary | ICD-10-CM

## 2015-10-08 DIAGNOSIS — E1040 Type 1 diabetes mellitus with diabetic neuropathy, unspecified: Secondary | ICD-10-CM

## 2015-10-08 LAB — BASIC METABOLIC PANEL
BUN: 5 mg/dL — ABNORMAL LOW (ref 6–23)
CALCIUM: 9.7 mg/dL (ref 8.4–10.5)
CHLORIDE: 96 meq/L (ref 96–112)
CO2: 29 meq/L (ref 19–32)
Creatinine, Ser: 0.64 mg/dL (ref 0.40–1.20)
GFR: 109.53 mL/min (ref >60.00)
Glucose, Bld: 174 mg/dL — ABNORMAL HIGH (ref 70–99)
POTASSIUM: 4.3 meq/L (ref 3.5–5.1)
SODIUM: 133 meq/L — AB (ref 135–145)

## 2015-10-08 LAB — GLUCOSE, RANDOM: GLUCOSE: 174 mg/dL — AB (ref 70–99)

## 2015-10-09 LAB — C-PEPTIDE: C PEPTIDE: 0.71 ng/mL — AB (ref 0.80–3.85)

## 2015-10-11 ENCOUNTER — Telehealth: Payer: Self-pay | Admitting: Endocrinology

## 2015-10-11 NOTE — Telephone Encounter (Signed)
Labs printed and faxed

## 2015-10-11 NOTE — Telephone Encounter (Signed)
Damon Blaco (calling about the pump for this PT) he said the only thing left that he needs for this PT is lab work.  I wasn't sure if this needed to go to you or Vaughan Basta. His cell # is [phone] Fax # [phone]

## 2015-11-01 ENCOUNTER — Emergency Department
Admission: EM | Admit: 2015-11-01 | Discharge: 2015-11-01 | Disposition: A | Discharge status: Home / Self Care | Payer: Medicaid Other | Attending: Emergency Medicine | Admitting: Emergency Medicine

## 2015-11-01 ENCOUNTER — Emergency Department: Payer: Medicaid Other

## 2015-11-01 ENCOUNTER — Encounter: Payer: Self-pay | Admitting: Emergency Medicine

## 2015-11-01 DIAGNOSIS — E109 Type 1 diabetes mellitus without complications: Secondary | ICD-10-CM | POA: Diagnosis not present

## 2015-11-01 DIAGNOSIS — F17210 Nicotine dependence, cigarettes, uncomplicated: Secondary | ICD-10-CM | POA: Insufficient documentation

## 2015-11-01 DIAGNOSIS — J209 Acute bronchitis, unspecified: Secondary | ICD-10-CM | POA: Diagnosis not present

## 2015-11-01 DIAGNOSIS — Z79899 Other long term (current) drug therapy: Secondary | ICD-10-CM | POA: Diagnosis not present

## 2015-11-01 DIAGNOSIS — J45909 Unspecified asthma, uncomplicated: Secondary | ICD-10-CM | POA: Diagnosis not present

## 2015-11-01 DIAGNOSIS — R05 Cough: Secondary | ICD-10-CM | POA: Diagnosis present

## 2015-11-01 DIAGNOSIS — F329 Major depressive disorder, single episode, unspecified: Secondary | ICD-10-CM | POA: Insufficient documentation

## 2015-11-01 DIAGNOSIS — Z794 Long term (current) use of insulin: Secondary | ICD-10-CM | POA: Insufficient documentation

## 2015-11-01 LAB — BASIC METABOLIC PANEL
Anion gap: 6 (ref 5–15)
BUN: 7 mg/dL (ref 6–20)
CHLORIDE: 97 mmol/L — AB (ref 101–111)
CO2: 26 mmol/L (ref 22–32)
CREATININE: 0.61 mg/dL (ref 0.44–1.00)
Calcium: 9.1 mg/dL (ref 8.9–10.3)
GFR calc Af Amer: >60 mL/min (ref >60)
GFR calc non Af Amer: >60 mL/min (ref >60)
GLUCOSE: 247 mg/dL — AB (ref 65–99)
Potassium: 4.4 mmol/L (ref 3.5–5.1)
Sodium: 129 mmol/L — ABNORMAL LOW (ref 135–145)

## 2015-11-01 LAB — CBC
HEMATOCRIT: 42.3 % (ref 35.0–47.0)
Hemoglobin: 14.7 g/dL (ref 12.0–16.0)
MCH: 30.9 pg (ref 26.0–34.0)
MCHC: 34.9 g/dL (ref 32.0–36.0)
MCV: 88.5 fL (ref 80.0–100.0)
PLATELETS: 186 10*3/uL (ref 150–440)
RBC: 4.78 MIL/uL (ref 3.80–5.20)
RDW: 12.9 % (ref 11.5–14.5)
WBC: 4.6 10*3/uL (ref 3.6–11.0)

## 2015-11-01 LAB — FIBRIN DERIVATIVES D-DIMER (ARMC ONLY): Fibrin derivatives D-dimer (ARMC): 249 (ref 0–499)

## 2015-11-01 LAB — TROPONIN I: Troponin I: <0.03 ng/mL (ref <0.031)

## 2015-11-01 MED ORDER — AMOXICILLIN-POT CLAVULANATE 875-125 MG PO TABS
ORAL_TABLET | ORAL | Status: AC
  Administered 2015-11-01: 1 via ORAL
  Filled 2015-11-01: qty 1

## 2015-11-01 MED ORDER — BENZONATATE 100 MG PO CAPS
200.0000 mg | ORAL_CAPSULE | Freq: Once | ORAL | Status: AC
  Administered 2015-11-01: 200 mg via ORAL

## 2015-11-01 MED ORDER — HYDROCOD POLST-CPM POLST ER 10-8 MG/5ML PO SUER: 5.0000 mL | Freq: Once | ORAL | Status: DC

## 2015-11-01 MED ORDER — PREDNISONE 20 MG PO TABS
ORAL_TABLET | ORAL | Status: AC
  Administered 2015-11-01: 40 mg via ORAL
  Filled 2015-11-01: qty 2

## 2015-11-01 MED ORDER — HYDROCOD POLST-CPM POLST ER 10-8 MG/5ML PO SUER: 5.0000 mL | Freq: Two times a day (BID) | ORAL | Status: AC | PRN

## 2015-11-01 MED ORDER — AMOXICILLIN-POT CLAVULANATE 875-125 MG PO TABS
1.0000 | ORAL_TABLET | Freq: Once | ORAL | Status: AC
  Administered 2015-11-01: 1 via ORAL

## 2015-11-01 MED ORDER — PREDNISONE 20 MG PO TABS
40.0000 mg | ORAL_TABLET | Freq: Once | ORAL | Status: AC
  Administered 2015-11-01: 40 mg via ORAL

## 2015-11-01 MED ORDER — PREDNISONE 20 MG PO TABS
40.0000 mg | ORAL_TABLET | Freq: Every day | ORAL | Status: AC
Start: 2015-11-01 — End: 2015-11-05

## 2015-11-01 MED ORDER — BENZONATATE 100 MG PO CAPS
ORAL_CAPSULE | ORAL | Status: AC
  Administered 2015-11-01: 200 mg via ORAL
  Filled 2015-11-01: qty 2

## 2015-11-01 MED ORDER — AMOXICILLIN-POT CLAVULANATE 875-125 MG PO TABS: 1.0000 | ORAL_TABLET | Freq: Two times a day (BID) | ORAL | Status: AC

## 2015-11-01 NOTE — ED Notes (Signed)
Cough for weeks.  Uses inhalers.  Now with chest pain mid chest that rad to just below left scapula.  Says it is a deep pain that increases with palpation.

## 2015-11-01 NOTE — Discharge Instructions (Signed)

## 2015-11-01 NOTE — ED Notes (Signed)
AAOx3/  Skin warm and dry.  Ambulates with easy and steady gait.  No SOB/ DOE

## 2015-11-01 NOTE — ED Provider Notes (Signed)
[facility] Emergency Department Provider Note  ____________________________________________  Time seen: 12:50 PM  I have reviewed the triage vital signs and the nursing notes.   HISTORY  Chief Complaint Cough; Chest Pain; and Back Pain     HPI [name] is a 40 y.o. female with history of 2 packs per day cigarette smoking history presents with 3 weeks of cough and congestion with chest discomfort and back pain with coughing. Patient states that the pain is worse with deep inspiration. Patient also states that the pain is worse with palpation. Patient denies any lower extremity pain or swelling. No history DVT/PE. Patient denies any fever.     Past Medical History  Diagnosis Date  . DIABETES MELLITUS, TYPE I 02/02/2007  . Anxiety state, unspecified 03/26/2009  . DEPRESSION 02/16/2009  . ASTHMA 02/02/2007  . Shortness of breath 09/25/2007  . GERD 12/21/2008  . Endometriosis     Patient Active Problem List   Diagnosis Date Noted  . Multinodular goiter 07/29/2015  . Type 1 diabetes mellitus with neurological manifestations, uncontrolled ([place]) 01/27/2014  . Hematuria 03/26/2012  . Myalgia 11/10/2010  . AIRWAY INFLAMMATION 06/08/2010  . Nonspecific (abnormal) findings on radiological and other examination of body structure 05/12/2010  . Tiburones LUNG FIELD 05/12/2010  . VULVAR ABSCESS 03/18/2010  . UTI 03/09/2010  . INSOMNIA 02/18/2010  . FOLLICULITIS 02/58/5277  . ANXIETY STATE, UNSPECIFIED 03/26/2009  . NAUSEA 02/19/2009  . DEPRESSION 02/16/2009  . GERD 12/21/2008  . ASTHMATIC BRONCHITIS, ACUTE 08/12/2008  . ACUTE MAXILLARY SINUSITIS 05/04/2008  . MOLE 01/29/2008  . Diarrhea 10/16/2007  . SMOKER 09/25/2007  . SHORTNESS OF BREATH 09/25/2007  . ASTHMA 02/02/2007    Past Surgical History  Procedure Laterality Date  . Abdominal hysterectomy  2009    BSO    Current Outpatient Rx  Name  Route  Sig  Dispense   Refill  . acetaminophen-codeine (TYLENOL #3) 300-30 MG tablet   Oral   Take 1-2 tablets by mouth every 4 (four) hours as needed for moderate pain.   12 tablet   0   . albuterol (PROVENTIL HFA) 108 (90 BASE) MCG/ACT inhaler   Inhalation   Inhale 2 puffs into the lungs every 6 (six) hours as needed. For shortness of breath         . amoxicillin-clavulanate (AUGMENTIN) 875-125 MG tablet   Oral   Take 1 tablet by mouth 2 (two) times daily.   20 tablet   0   . atorvastatin (LIPITOR) 10 MG tablet   Oral   Take 10 mg by mouth at bedtime.         . Blood Glucose Monitoring Suppl (ACCU-CHEK AVIVA PLUS) W/DEVICE KIT   Does not apply   1 Device by Does not apply route once.   1 kit   0   . chlorpheniramine-HYDROcodone (TUSSIONEX) 10-8 MG/5ML SUER   Oral   Take 5 mLs by mouth every 12 (twelve) hours as needed for cough.   140 mL   0   . EXPIRED: Fluticasone-Salmeterol (ADVAIR DISKUS) 250-50 MCG/DOSE AEPB   Inhalation   Inhale 1 puff into the lungs 2 (two) times daily. Inhale 1 puff into the lungs 2 (two) times daily.         Marland Kitchen gabapentin (NEURONTIN) 300 MG capsule      1 po QHS; may increase to 2 po QHS after 1 week if night sweats persist         .  glucagon 1 MG injection   Intramuscular   Inject 1 mg into the muscle once as needed.   1 each   12   . glucose blood (ACCU-CHEK AVIVA PLUS) test strip      USE TO TEST BLOOD SUGAR 7 TIMES DAILY, and lancets 7/day. DX E10.41   250 each   11   . HYDROcodone-acetaminophen (NORCO) 5-325 MG tablet   Oral   Take 1-2 tablets by mouth every 4 (four) hours as needed for moderate pain.   15 tablet   0   . ibuprofen (ADVIL,MOTRIN) 800 MG tablet   Oral   Take 800 mg by mouth.         . Insulin Detemir (LEVEMIR FLEXTOUCH) 100 UNIT/ML Pen   Subcutaneous   Inject 6 Units into the skin at bedtime.   15 mL   11   . insulin lispro (HUMALOG KWIKPEN) 100 UNIT/ML KiwkPen      15 units at breakfast 6 units at lunch and 8  units with the evening meal.   15 mL   5   . Insulin Pen Needle 31G X 8 MM MISC      Use as directed          . omeprazole (PRILOSEC) 20 MG capsule   Oral   Take 20 mg by mouth 2 (two) times daily.         Marland Kitchen PARoxetine (PAXIL) 40 MG tablet   Oral   Take 1 tablet (40 mg total) by mouth daily.   30 tablet   0     Appointment needed for further refills   . predniSONE (DELTASONE) 20 MG tablet   Oral   Take 2 tablets (40 mg total) by mouth daily with breakfast.   8 tablet   0   . rifaximin (XIFAXAN) 550 MG TABS tablet   Oral   Take 1 tablet (550 mg total) by mouth 3 (three) times daily.   42 tablet   0   . rOPINIRole (REQUIP) 0.5 MG tablet      TAKE THREE TABLETS BY MOUTH AT BEDTIME   90 tablet   1     Needs appointment for further refills.     Allergies Review of patient's allergies indicates no known allergies.  Family History  Problem Relation Age of Onset  . Diabetes Mother   . Diabetes Maternal Aunt   . Emphysema Maternal Aunt   . Emphysema Maternal Uncle   . Diabetes Maternal Grandfather   . Lung cancer Maternal Aunt   . Colon cancer Neg Hx   . Kidney disease Neg Hx   . Liver disease Neg Hx     Social History Social History  Substance Use Topics  . Smoking status: Current Every Day Smoker -- 1.00 packs/day for 20 years    Types: Cigarettes  . Smokeless tobacco: Never Used     Comment: form given 06/05/14  . Alcohol Use: No    Review of Systems  Constitutional: Negative for fever. Eyes: Negative for visual changes. ENT: Negative for sore throat. Cardiovascular: Positive for chest pain. Respiratory: Negative for shortness of breath. Positive for cough Gastrointestinal: Negative for abdominal pain, vomiting and diarrhea. Genitourinary: Negative for dysuria. Musculoskeletal: Negative for back pain. Skin: Negative for rash. Neurological: Negative for headaches, focal weakness or numbness.   10-point ROS otherwise  negative.  ____________________________________________   PHYSICAL EXAM:  VITAL SIGNS: ED Triage Vitals  Enc Vitals Group     BP 11/01/15 1140  136/85 mmHg     Pulse Rate 11/01/15 1140 107     Resp 11/01/15 1140 18     Temp 11/01/15 1140 97.9 F (36.6 C)     Temp Source 11/01/15 1140 Oral     SpO2 11/01/15 1140 98 %     Weight 11/01/15 1140 130 lb (58.968 kg)     Height 11/01/15 1140 '5\' 5"'  (1.651 m)     Head Cir --      Peak Flow --      Pain Score 11/01/15 1141 8     Pain Loc --      Pain Edu? --      Excl. in [place]? --      Constitutional: Alert and oriented. Well appearing and in no distress. Eyes: Conjunctivae are normal. PERRL. Normal extraocular movements. ENT   Head: Normocephalic and atraumatic.   Nose: No congestion/rhinnorhea.   Mouth/Throat: Mucous membranes are moist.   Neck: No stridor. Hematological/Lymphatic/Immunilogical: No cervical lymphadenopathy. Cardiovascular: Normal rate, regular rhythm. Normal and symmetric distal pulses are present in all extremities. No murmurs, rubs, or gallops. Palpation medial aspect of right scapula Respiratory: Normal respiratory effort without tachypnea nor retractions. Breath sounds are clear and equal bilaterally. Mild expiratory wheezes noted on auscultation Gastrointestinal: Soft and nontender. No distention. There is no CVA tenderness. Genitourinary: deferred Musculoskeletal: Nontender with normal range of motion in all extremities. No joint effusions.  No lower extremity tenderness nor edema. Neurologic:  Normal speech and language. No gross focal neurologic deficits are appreciated. Speech is normal.  Skin:  Skin is warm, dry and intact. No rash noted. Psychiatric: Mood and affect are normal. Speech and behavior are normal. Patient exhibits appropriate insight and judgment.  ____________________________________________    LABS (pertinent positives/negatives)  Labs Reviewed  BASIC METABOLIC PANEL -  Abnormal; Notable for the following:    Sodium 129 (*)    Chloride 97 (*)    Glucose, Bld 247 (*)    All other components within normal limits  CBC  TROPONIN I  FIBRIN DERIVATIVES D-DIMER ([facility] ONLY)    ____________________________________________   EKG  ED ECG REPORT I,  N BROWN, the attending physician, personally viewed and interpreted this ECG.   Date: 11/01/2015  EKG Time: 11:30AM  Rate: 106  Rhythm: Sinus tachycardia  Axis: Normal  Intervals: Normal  ST&T Change: None   ____________________________________________    RADIOLOGY      DG Chest 2 View (Final result) Result time: 11/01/15 12:28:29   Final result by Rad Results In Interface (11/01/15 12:28:29)   Narrative:   CLINICAL DATA: Cough for several weeks with left-sided chest pain, initial encounter  EXAM: CHEST 2 VIEW  COMPARISON: 07/04/2015  FINDINGS: Cardiac shadow is within normal limits. The lungs are well aerated bilaterally. No focal infiltrate or sizable effusion is seen. No acute bony abnormality is noted.  IMPRESSION: No active cardiopulmonary disease.   Electronically Signed By: Inez Catalina M.D. On: 11/01/2015 12:28            INITIAL IMPRESSION / ASSESSMENT AND PLAN / ED COURSE  Pertinent labs & imaging results that were available during my care of the patient were reviewed by me and considered in my medical decision making (see chart for details).  History of physical exam consistent with acute on chronic bronchitis. Patient received Augmentin, Tussionex and prednisone. Patient was advised to be vigilant regarding monitoring glucose at home given possibility of hyperglycemia while on prednisone.  ____________________________________________   FINAL CLINICAL IMPRESSION(S) /  ED DIAGNOSES  Final diagnoses:  Acute bronchitis, unspecified organism      Gregor Hams, MD 11/01/15 1312

## 2015-11-02 ENCOUNTER — Encounter: Payer: Medicaid Other | Admitting: Nutrition

## 2015-11-08 ENCOUNTER — Ambulatory Visit (INDEPENDENT_AMBULATORY_CARE_PROVIDER_SITE_OTHER): Payer: Medicaid Other | Admitting: Podiatry

## 2015-11-08 ENCOUNTER — Ambulatory Visit (INDEPENDENT_AMBULATORY_CARE_PROVIDER_SITE_OTHER): Payer: Medicaid Other

## 2015-11-08 ENCOUNTER — Encounter: Payer: Self-pay | Admitting: Podiatry

## 2015-11-08 VITALS — BP 114/71 | HR 107 | Resp 16

## 2015-11-08 DIAGNOSIS — L600 Ingrowing nail: Secondary | ICD-10-CM | POA: Diagnosis not present

## 2015-11-08 DIAGNOSIS — E119 Type 2 diabetes mellitus without complications: Secondary | ICD-10-CM | POA: Diagnosis not present

## 2015-11-08 MED ORDER — NEOMYCIN-POLYMYXIN-HC 1 % OT SOLN: OTIC | Status: DC

## 2015-11-08 NOTE — Patient Instructions (Signed)

## 2015-11-08 NOTE — Progress Notes (Signed)
   Subjective:    Patient ID: [name], female    DOB: 20-Sep-1975, 40 y.o.   MRN: [id number]  HPI: She presents today as a diabetic with a chief complaint of ingrown toenail fibular border of the hallux left. States it is been going on now for several weeks and seems to have improved some since I been taking antibiotics for bronchitis. She also states that she's been taking prednisone for her bronchitis and her blood sugars have been elevated. Generally her blood sugars run in the 120s and 30s she states. She's tried digging the ingrown nail herself but she states it is so painful she can hardly stand.    Review of Systems  All other systems reviewed and are negative.      Objective:   Physical Exam: Vital signs are stable she is alert and oriented 3 pulses are palpable. Neurologic sensorium is intact deep tendon reflexes are intact bilateral muscle strength is equal symmetrical bilateral. Orthopedic evaluation and x-rays all joints distal to the ankle and full range of motion without crepitation. Cutaneous evaluation demonstrates supple well-hydrated cutis no erythema edema cellulitis drainage with exception of erythema and edema to the fibular border hallux left with a sharp incurvated nail margin. Radiographs taken today demonstrate normal osseous architecture no abnormalities.        Assessment & Plan:  Ingrown nail paronychia abscess hallux left. Diabetes mellitus moderately controlled.  Plan: Formed chemical matrixectomy fibular border of the hallux left today after local anesthesia was administered. She tolerated this procedure well. She was provided with both oral and written home-going instructions for care and soaking of her toe. And I will follow-up with her in 1-2 weeks.

## 2015-11-12 ENCOUNTER — Telehealth: Payer: Self-pay | Admitting: Endocrinology

## 2015-11-12 DIAGNOSIS — E109 Type 1 diabetes mellitus without complications: Secondary | ICD-10-CM

## 2015-11-12 NOTE — Telephone Encounter (Signed)
See note below and please advise. Thanks! 

## 2015-11-12 NOTE — Telephone Encounter (Signed)
PT said she called to set up an appt with Nutrition and they said she needs a referral to get an appt.

## 2015-11-12 NOTE — Telephone Encounter (Signed)
done

## 2015-11-12 NOTE — Telephone Encounter (Signed)
Pt's mom advised referral has been placed. She will advise the pt.

## 2015-11-15 ENCOUNTER — Encounter: Payer: Self-pay | Admitting: Podiatry

## 2015-11-15 ENCOUNTER — Ambulatory Visit (INDEPENDENT_AMBULATORY_CARE_PROVIDER_SITE_OTHER): Payer: Medicaid Other | Admitting: Podiatry

## 2015-11-15 DIAGNOSIS — L03032 Cellulitis of left toe: Secondary | ICD-10-CM

## 2015-11-15 MED ORDER — CLINDAMYCIN HCL 150 MG PO CAPS: 150.0000 mg | ORAL_CAPSULE | Freq: Three times a day (TID) | ORAL | Status: DC

## 2015-11-15 NOTE — Progress Notes (Signed)
She presents today for her one-week follow-up ingrown toenail fibular border hallux left. States that she has been soaking twice a day until most recently where she is only been soaking once a day but continues to soak in Betadine and warm water. The toe is mildly tender and painful.  Objective: Final signs are stable she is alert and oriented 3. Pulses are palpable. Neurologic sensorium is intact per Semmes-Weinstein monofilament. Deep tendon reflex are intact. Surgical site appears to be erythematous and edematous no drainage noted.  Assessment: Well-healed surgical toe left. Mild paronychia.  Plan: Started her on clindamycin and I will follow-up with her in 2 weeks she is to start soaking in Epsom salts and warm water. She will cover the wound during the daytime but leave it open at bedtime.

## 2015-11-29 ENCOUNTER — Encounter: Payer: Self-pay | Admitting: Podiatry

## 2015-11-29 ENCOUNTER — Ambulatory Visit (INDEPENDENT_AMBULATORY_CARE_PROVIDER_SITE_OTHER): Payer: Medicaid Other | Admitting: Podiatry

## 2015-11-29 DIAGNOSIS — L03032 Cellulitis of left toe: Secondary | ICD-10-CM

## 2015-11-29 NOTE — Patient Instructions (Signed)

## 2015-11-29 NOTE — Progress Notes (Signed)
She presents today for follow-up of matrixectomy hallux left. She states she still has some draining and I have finished antibiotics.  Objective: Vital signs are stable she's alert and oriented 3. Pulses are palpable. Mild erythema no cellulitis some mild serosanguineous drainage no signs of infection.  Assessment: Resolving paronychia status post matrixectomy fibular border hallux left foot.  Plan: Continue to soak strong Epsom salts and warm water for the next 2 weeks twice daily covered during the daytime and leave open at bedtime. Should this not get better she is to notify us immediately.

## 2015-12-01 ENCOUNTER — Other Ambulatory Visit: Payer: Self-pay | Admitting: Family Medicine

## 2015-12-01 ENCOUNTER — Ambulatory Visit
Admission: RE | Admit: 2015-12-01 | Discharge: 2015-12-01 | Disposition: A | Discharge status: Home / Self Care | Payer: Medicaid Other | Source: Ambulatory Visit | Attending: Family Medicine | Admitting: Family Medicine

## 2015-12-01 DIAGNOSIS — F17200 Nicotine dependence, unspecified, uncomplicated: Secondary | ICD-10-CM

## 2015-12-01 DIAGNOSIS — R0789 Other chest pain: Secondary | ICD-10-CM

## 2015-12-01 DIAGNOSIS — N6011 Diffuse cystic mastopathy of right breast: Secondary | ICD-10-CM

## 2015-12-07 ENCOUNTER — Encounter: Payer: Medicaid Other | Attending: Endocrinology | Admitting: Dietician

## 2015-12-07 ENCOUNTER — Telehealth: Payer: Self-pay | Admitting: Endocrinology

## 2015-12-07 ENCOUNTER — Encounter: Payer: Self-pay | Admitting: Dietician

## 2015-12-07 VITALS — Ht 66.0 in | Wt 134.0 lb

## 2015-12-07 DIAGNOSIS — E108 Type 1 diabetes mellitus with unspecified complications: Secondary | ICD-10-CM

## 2015-12-07 DIAGNOSIS — E109 Type 1 diabetes mellitus without complications: Secondary | ICD-10-CM | POA: Diagnosis not present

## 2015-12-07 NOTE — Telephone Encounter (Signed)
Can we call in bottles of novolog please she is switching to the pump call into [facility] on [address]

## 2015-12-07 NOTE — Progress Notes (Signed)
Medical Nutrition Therapy:  Appt start time: 1030 end time:  1200.   Assessment:  Primary concerns today: Patient is here alone.  She would like to get a pump and is here to learn more about carbohydrate counting.  In the past she would just guess how many units of insulin to take and then battle low blood sugar and have to eat a lot to compensate.  More recently she has been counting carbohydrates somewhat and aims for 1 unit of insulin per every 15 grams of carbohydrate but she is not sure.  Hx includes Type 1 Diabetes since 1989 and GERD.  She checks her blood sugar 3 times per day and it is often 170 in the am.  Her last HgbA1C was 9.3% 07/29/15.  She has seen Vaughan Basta about the pump in January.  She smokes.  She quit for 3 months but resumed.  Her husband also smokes.  She lives with her husband and 2 children.  She is not employed and does all of the shopping and cooking.  Preferred Learning Style:   No preference indicated   Learning Readiness:   Ready  Change in progress  MEDICATIONS: see list to include humalog based on meal intake and blood sugar as well as Levemir 4 units every HS.   DIETARY INTAKE:  Usual eating pattern includes 2 meals and 2 snacks per day. Everyday foods include chips, cereal.     24-hr recall:  B (9:30 AM): McDonald's 3 days per week (Sausage biscuit and hash brown) OR frozen waffles with regular syrup or Toast and Jelly OR eggs and toast on the weekend Snk ( AM): usually none unless cleaning or something else that lowers her blood sugar  L ( PM): none most of the time except a handful of chips OR peanut butter sandwich and chips Snk ( PM): chips D (5 PM): meat, potatoes or macaroni, vege and occasional roll Snk ( PM): Alpha bits cereal and whole milk Beverages: diet soda, diet Dr. Malachi Bonds, 1 cup whole milk, OJ only if sugar gets low  Usual physical activity: none   Estimated energy needs: 1800 calories 200 g carbohydrates 135 g protein 50 g  fat  Progress Towards Goal(s):  In progress.   Nutritional Diagnosis:  NB-1.1 Food and nutrition-related knowledge deficit As related to carbohydrate counting.  As evidenced by observation.    Intervention:  Nutrition education related to carbohydrate counting.  Showed carbohydrate amounts by portion size.  Practiced counting grams.  Gave resources to look up restaurant foods and unknown items. Encouraged her to stop smoking.  Patient was able to demonstrate but will need to use a calculator and will need further practice.  I recommend that she see me again after she gets the insulin pump or follow up with Vaughan Basta, CDE regarding carbohydrate counting.  Practice counting carbohydrates.  Use a calculator if needed. Have a small lunch or snack mid day Have a small amount of protein each time you eat. Be sure to have a protein with your bedtime snack and consider reducing the amount of carbohydrate. Consider stopping smoking.  Teaching Method Utilized:  Visual Auditory Hands on  Handouts given during visit include:  Yellow meal plan card  App list for counting carbohydrates  Snack list  Carb counting for diabetes  Label reading  Barriers to learning/adherence to lifestyle change: math skills  Demonstrated degree of understanding via:  Teach Back   Monitoring/Evaluation:  Dietary intake, exercise, label reading, and body weight prn.

## 2015-12-07 NOTE — Telephone Encounter (Signed)
[name] Are you aware pt is starting pump rx?

## 2015-12-07 NOTE — Telephone Encounter (Signed)
See note below and please advise, Thansk!

## 2015-12-07 NOTE — Patient Instructions (Signed)
Practice counting carbohydrates.  Use a calculator if needed. Have a small lunch or snack mid day Have a small amount of protein each time you eat. Be sure to have a protein with your bedtime snack and consider reducing the amount of carbohydrate. Consider stopping smoking.

## 2015-12-08 MED ORDER — INSULIN LISPRO 100 UNIT/ML ~~LOC~~ SOLN: SUBCUTANEOUS | Status: DC

## 2015-12-08 NOTE — Addendum Note (Signed)
Addended by: Renato Shin on: 12/08/2015 10:30 AM   Modules accepted: Orders, Medications

## 2015-12-08 NOTE — Telephone Encounter (Signed)
i have sent a prescription to your pharmacy Please start with these settings: basal rate of 0.4 units/hr, except for units/hr, 24 hrs per day.  mealtime bolus of 1 unit/ 20 grams carbohydrate.  correction bolus (which some people call "sensitivity," or "insulin sensitivity ratio," or just "isr") of 1 unit for each 100 by which your glucose exceeds 100 Please come in for ov 2-3 days after starting pump

## 2015-12-08 NOTE — Telephone Encounter (Signed)
Patient has ordered an OmniPod pump.  It has not shipped yet

## 2015-12-09 ENCOUNTER — Other Ambulatory Visit: Payer: Self-pay | Admitting: Family Medicine

## 2015-12-09 DIAGNOSIS — N6011 Diffuse cystic mastopathy of right breast: Secondary | ICD-10-CM

## 2015-12-15 ENCOUNTER — Ambulatory Visit
Admission: RE | Admit: 2015-12-15 | Discharge: 2015-12-15 | Disposition: A | Discharge status: Home / Self Care | Payer: Medicaid Other | Source: Ambulatory Visit | Attending: Family Medicine | Admitting: Family Medicine

## 2015-12-15 ENCOUNTER — Telehealth: Payer: Self-pay | Admitting: Endocrinology

## 2015-12-15 ENCOUNTER — Other Ambulatory Visit: Payer: Self-pay

## 2015-12-15 ENCOUNTER — Other Ambulatory Visit: Payer: Self-pay | Admitting: Family Medicine

## 2015-12-15 DIAGNOSIS — N6011 Diffuse cystic mastopathy of right breast: Secondary | ICD-10-CM

## 2015-12-15 MED ORDER — INSULIN ASPART 100 UNIT/ML ~~LOC~~ SOLN: SUBCUTANEOUS | Status: DC

## 2015-12-15 MED ORDER — INSULIN ASPART 100 UNIT/ML ~~LOC~~ SOLN: 40.0000 [IU] | Freq: Three times a day (TID) | SUBCUTANEOUS | Status: DC

## 2015-12-15 NOTE — Telephone Encounter (Signed)
Pt needs to see if we can call in a rx for novolog vials for her pump  Let the pt know that the humalog has been called in to [place]

## 2015-12-15 NOTE — Telephone Encounter (Signed)
Patient need a prescription for Novalog send to  [facility] [id number] [place] [address] [place] [phone] (Phone) [phone] (Fax)

## 2015-12-15 NOTE — Telephone Encounter (Signed)
Rx submitted

## 2015-12-15 NOTE — Telephone Encounter (Signed)
Pt is going to check and see if the Humalog is covered that was called in on 12/08/2015 and let us know if its not covered.

## 2015-12-17 ENCOUNTER — Other Ambulatory Visit: Payer: Self-pay

## 2015-12-17 MED ORDER — INSULIN ASPART 100 UNIT/ML ~~LOC~~ SOLN: SUBCUTANEOUS | Status: DC

## 2015-12-20 ENCOUNTER — Other Ambulatory Visit: Payer: Self-pay

## 2015-12-20 MED ORDER — INSULIN ASPART 100 UNIT/ML ~~LOC~~ SOLN: SUBCUTANEOUS | Status: DC

## 2015-12-21 ENCOUNTER — Telehealth: Payer: Self-pay | Admitting: Family Medicine

## 2015-12-21 ENCOUNTER — Encounter: Payer: Medicaid Other | Attending: Endocrinology | Admitting: Nutrition

## 2015-12-21 DIAGNOSIS — E1065 Type 1 diabetes mellitus with hyperglycemia: Secondary | ICD-10-CM

## 2015-12-21 DIAGNOSIS — E109 Type 1 diabetes mellitus without complications: Secondary | ICD-10-CM | POA: Insufficient documentation

## 2015-12-21 NOTE — Telephone Encounter (Signed)
Please call patient about the pump she has. Insurance company is saying they will not cover.

## 2015-12-22 ENCOUNTER — Telehealth: Payer: Self-pay | Admitting: Nutrition

## 2015-12-22 ENCOUNTER — Encounter: Payer: Medicaid Other | Admitting: Nutrition

## 2015-12-22 DIAGNOSIS — E1065 Type 1 diabetes mellitus with hyperglycemia: Secondary | ICD-10-CM

## 2015-12-22 NOTE — Telephone Encounter (Signed)
Patient stated that she left some papers, and to hold on to them for her.

## 2015-12-22 NOTE — Patient Instructions (Signed)
Continue to test blood sugars before meals, at bedtime and 3AM. Read over manual and watch video for help with how to give bolus, or cartridge changes. Change cartridge every 3 days.

## 2015-12-22 NOTE — Progress Notes (Signed)
Pt. Was instructed on the use of the [place] Pump.  She had set up date/time on the pump and reported to have read some of the Manual She put in the settings into the pump, per Dr. Cordelia Pen order: Basal rate:. 0.4, I/C 20, ISF: 100, Target: 110 (pt. Did not feel comfortable with the 100 that Dr. [name] wanted.), timing: 4 hours We reviewed how to give a bolus and she redemonstrated thisX2, without any difficulty.    She filled a cartridge with Novolog insulin, and inserted the Inset 6MM infusion set into her left abdomen, without and difficulty.  She did not take lantus yesterday or today, because she forgot.  Blood sugar before starting the pump was 228.    See checklist of other topics discussed.    She was given a log to record her blood sugars and told to test ac, 2hr. Pc, hs, and 3 AM.  She agreed to do this.  I will call her tonight to see how she is doing.  She was told to call her before 5PM if blood sugars continued to drop, or if remained over 250.  She agreed to do ths

## 2015-12-22 NOTE — Patient Instructions (Addendum)
Test blood sugars as directed Call if blood sugars drop low, or stay over 200 today. Watch DVD for review of how to fill a cartridge, give a bolus and insert an infusion set.

## 2015-12-22 NOTE — Telephone Encounter (Signed)
Drug rep. Contacted to help with pump she is wanting

## 2015-12-22 NOTE — Progress Notes (Signed)
Pt. Reported no difficulty wearing pump, or bolusing   .Had  FBS today was 101 Reviewed temp. Basal rates--when and how to do this, extended boluses, sick days, high blood sugar protocols.   She reported good understanding of these topics. She did another cartridge and infusion set change with little assistance from me.   She signed off as understanding all topics and had no final questions.   She will call me tomorrow with blood sugar readings, and see Dr. [name] next week.  She was told to call the office if blood sugars drop low.

## 2015-12-28 NOTE — Telephone Encounter (Signed)
Paper were mailed to her that evening after her appt.

## 2015-12-29 ENCOUNTER — Telehealth: Payer: Self-pay | Admitting: Nutrition

## 2015-12-31 ENCOUNTER — Ambulatory Visit (INDEPENDENT_AMBULATORY_CARE_PROVIDER_SITE_OTHER): Payer: Medicaid Other | Admitting: Endocrinology

## 2015-12-31 ENCOUNTER — Encounter: Payer: Self-pay | Admitting: Endocrinology

## 2015-12-31 VITALS — BP 112/56 | HR 61 | Temp 98.6°F | Ht 65.0 in | Wt 128.1 lb

## 2015-12-31 DIAGNOSIS — E109 Type 1 diabetes mellitus without complications: Secondary | ICD-10-CM | POA: Diagnosis not present

## 2015-12-31 DIAGNOSIS — E108 Type 1 diabetes mellitus with unspecified complications: Secondary | ICD-10-CM

## 2015-12-31 DIAGNOSIS — N3000 Acute cystitis without hematuria: Secondary | ICD-10-CM

## 2015-12-31 LAB — URINALYSIS, ROUTINE W REFLEX MICROSCOPIC
BILIRUBIN URINE: NEGATIVE
KETONES UR: NEGATIVE
Leukocytes, UA: NEGATIVE
Nitrite: NEGATIVE
PH: 5.5 (ref 5.0–8.0)
TOTAL PROTEIN, URINE-UPE24: NEGATIVE
UROBILINOGEN UA: 0.2 (ref 0.0–1.0)
WBC, UA: NONE SEEN (ref 0–?)

## 2015-12-31 LAB — POCT GLYCOSYLATED HEMOGLOBIN (HGB A1C): HEMOGLOBIN A1C: 9.3

## 2015-12-31 LAB — MICROALBUMIN / CREATININE URINE RATIO
CREATININE, U: 9.9 mg/dL
MICROALB/CREAT RATIO: 7.1 mg/g (ref 0.0–30.0)
Microalb, Ur: <0.7 mg/dL (ref 0.0–1.9)

## 2015-12-31 MED ORDER — INSULIN ASPART 100 UNIT/ML ~~LOC~~ SOLN: SUBCUTANEOUS | Status: DC

## 2015-12-31 NOTE — Progress Notes (Signed)
Subjective:    Patient ID: [name], female    DOB: 09-01-1975, 40 y.o.   MRN: [id number]  HPI Pt returns for f/u of diabetes mellitus: DM type: 1 Dx'ed: 6712 Complications: poly neuropathy and retinopathy. Therapy: insulin since dx, pump rx since 2017 GDM: never DKA: never Severe hypoglycemia: never Pancreatitis: never Other: she takes multiple daily injections; therapy has been limited by chronic noncompliance; she has had TAH.   Interval history:  She takes these pump settings: basal rate of 0.4 units/hr, 24 hrs per day.  mealtime bolus of 1 unit/ 20 grams carbohydrate.  correction bolus (which some people call "sensitivity," or "insulin sensitivity ratio," or just "isr") of 1 unit for each 100 by which your glucose exceeds 100.   no cbg record, but states cbg's vary from 110-300.  It is highest after a big meal.  She says cbg's are much better overall.  She averages a total of approx 20 units/day.  She denies hypoglycemia.  She misses lunch at work approx qod.  She works 5 days/week, with varying days of the week.  She has not yet started continuous glucose monitor Past Medical History  Diagnosis Date  . DIABETES MELLITUS, TYPE I 02/02/2007  . Anxiety state, unspecified 03/26/2009  . DEPRESSION 02/16/2009  . ASTHMA 02/02/2007  . Shortness of breath 09/25/2007  . GERD 12/21/2008  . Endometriosis     Past Surgical History  Procedure Laterality Date  . Abdominal hysterectomy  2009    BSO    Social History   Social History  . Marital Status: Legally Separated    Spouse Name: N/A  . Number of Children: 2  . Years of Education: N/A   Occupational History  .     Social History Main Topics  . Smoking status: Current Every Day Smoker -- 1.00 packs/day for 20 years    Types: Cigarettes  . Smokeless tobacco: Never Used     Comment: form given 06/05/14  . Alcohol Use: No  . Drug Use: No  . Sexual Activity: Not on file   Other Topics Concern  . Not on file    Social History Narrative   2 children--pt has custody. Husband pays child support.   Daily Caffeine Use-3 2 liters a day   Pt does not get regular exercise.    Current Outpatient Prescriptions on File Prior to Visit  Medication Sig Dispense Refill  . albuterol (PROVENTIL HFA) 108 (90 BASE) MCG/ACT inhaler Inhale 2 puffs into the lungs every 6 (six) hours as needed. For shortness of breath    . atorvastatin (LIPITOR) 10 MG tablet Take 10 mg by mouth at bedtime.    . Blood Glucose Monitoring Suppl (ACCU-CHEK AVIVA PLUS) W/DEVICE KIT 1 Device by Does not apply route once. 1 kit 0  . gabapentin (NEURONTIN) 300 MG capsule 1 po QHS; may increase to 2 po QHS after 1 week if night sweats persist    . glucagon 1 MG injection Inject 1 mg into the muscle once as needed. 1 each 12  . glucose blood (ACCU-CHEK AVIVA PLUS) test strip USE TO TEST BLOOD SUGAR 7 TIMES DAILY, and lancets 7/day. DX E10.41 250 each 11  . ibuprofen (ADVIL,MOTRIN) 800 MG tablet Take 800 mg by mouth.    . Insulin Pen Needle 31G X 8 MM MISC Use as directed     . lactobacillus acidophilus (BACID) TABS tablet Take 2 tablets by mouth 3 (three) times daily.    Marland Kitchen omeprazole (  PRILOSEC) 20 MG capsule Take 20 mg by mouth 2 (two) times daily.    Marland Kitchen PARoxetine (PAXIL) 40 MG tablet Take 1 tablet (40 mg total) by mouth daily. 30 tablet 0  . rifaximin (XIFAXAN) 550 MG TABS tablet Take 1 tablet (550 mg total) by mouth 3 (three) times daily. 42 tablet 0  . rOPINIRole (REQUIP) 0.5 MG tablet TAKE THREE TABLETS BY MOUTH AT BEDTIME 90 tablet 1  . Fluticasone-Salmeterol (ADVAIR DISKUS) 250-50 MCG/DOSE AEPB Inhale 1 puff into the lungs 2 (two) times daily. Inhale 1 puff into the lungs 2 (two) times daily.     No current facility-administered medications on file prior to visit.    No Known Allergies  Family History  Problem Relation Age of Onset  . Diabetes Mother   . Diabetes Maternal Aunt   . Emphysema Maternal Aunt   . Emphysema Maternal  Uncle   . Diabetes Maternal Grandfather   . Lung cancer Maternal Aunt   . Colon cancer Neg Hx   . Kidney disease Neg Hx   . Liver disease Neg Hx     BP 112/56 mmHg  Pulse 61  Temp(Src) 98.6 F (37 C) (Oral)  Ht _0  (1.651 m)  Wt 128 lb 2 oz (58.117 kg)  BMI 21.32 kg/m2  SpO2 96%    Review of Systems No weight change.  She had dysuria    Objective:   Physical Exam VITAL SIGNS:  See vs page GENERAL: no distress SKIN:  Insulin injection sites at the anterior abdomen are normal Pulses: dorsalis pedis intact bilat.   MSK: no deformity of the feet.  CV: no leg edema Skin:  no ulcer on the feet.  normal color and temp on the feet. Neuro: sensation is intact to touch on the feet.      A1c=9.3%    Assessment & Plan:  Type 1 DM: based on the pattern of her cbg's, she needs some adjustment in her therapy.  However, she declines fructosamine and pump adjustments today.    Patient is advised the following: Patient Instructions  Please continue these settings: basal rate of 0.4 units/hr, 24 hrs per day.  mealtime bolus of 1 unit/ 20 grams carbohydrate.  correction bolus (which some people call "sensitivity," or "insulin sensitivity ratio," or just "isr") of 1 unit for each 100 by which your glucose exceeds 100.   Please come back for a follow-up appointment in 1 month. urine tests are requested for you today.  We'll let you know about the results.  Renato Shin, MD

## 2015-12-31 NOTE — Patient Instructions (Addendum)
Please continue these settings: basal rate of 0.4 units/hr, 24 hrs per day.  mealtime bolus of 1 unit/ 20 grams carbohydrate.  correction bolus (which some people call "sensitivity," or "insulin sensitivity ratio," or just "isr") of 1 unit for each 100 by which your glucose exceeds 100.   Please come back for a follow-up appointment in 1 month. urine tests are requested for you today.  We'll let you know about the results.

## 2015-12-31 NOTE — Progress Notes (Signed)
Pre visit review using our clinic review tool, if applicable. No additional management support is needed unless otherwise documented below in the visit note. 

## 2016-01-02 ENCOUNTER — Emergency Department
Admission: EM | Admit: 2016-01-02 | Discharge: 2016-01-02 | Disposition: A | Discharge status: Home / Self Care | Payer: Medicaid Other | Attending: Emergency Medicine | Admitting: Emergency Medicine

## 2016-01-02 ENCOUNTER — Encounter: Payer: Self-pay | Admitting: Emergency Medicine

## 2016-01-02 DIAGNOSIS — H65193 Other acute nonsuppurative otitis media, bilateral: Secondary | ICD-10-CM | POA: Insufficient documentation

## 2016-01-02 DIAGNOSIS — H6092 Unspecified otitis externa, left ear: Secondary | ICD-10-CM

## 2016-01-02 DIAGNOSIS — Z79899 Other long term (current) drug therapy: Secondary | ICD-10-CM | POA: Diagnosis not present

## 2016-01-02 DIAGNOSIS — E1149 Type 2 diabetes mellitus with other diabetic neurological complication: Secondary | ICD-10-CM | POA: Insufficient documentation

## 2016-01-02 DIAGNOSIS — H9203 Otalgia, bilateral: Secondary | ICD-10-CM | POA: Diagnosis present

## 2016-01-02 DIAGNOSIS — F329 Major depressive disorder, single episode, unspecified: Secondary | ICD-10-CM | POA: Diagnosis not present

## 2016-01-02 DIAGNOSIS — J449 Chronic obstructive pulmonary disease, unspecified: Secondary | ICD-10-CM | POA: Insufficient documentation

## 2016-01-02 DIAGNOSIS — F17210 Nicotine dependence, cigarettes, uncomplicated: Secondary | ICD-10-CM | POA: Diagnosis not present

## 2016-01-02 DIAGNOSIS — Z794 Long term (current) use of insulin: Secondary | ICD-10-CM | POA: Diagnosis not present

## 2016-01-02 DIAGNOSIS — H65113 Acute and subacute allergic otitis media (mucoid) (sanguinous) (serous), bilateral: Secondary | ICD-10-CM

## 2016-01-02 DIAGNOSIS — J309 Allergic rhinitis, unspecified: Secondary | ICD-10-CM | POA: Diagnosis not present

## 2016-01-02 DIAGNOSIS — H6983 Other specified disorders of Eustachian tube, bilateral: Secondary | ICD-10-CM | POA: Insufficient documentation

## 2016-01-02 LAB — CULTURE, URINE COMPREHENSIVE
COLONY COUNT: NO GROWTH
ORGANISM ID, BACTERIA: NO GROWTH

## 2016-01-02 MED ORDER — FLUTICASONE PROPIONATE 50 MCG/ACT NA SUSP: 1.0000 | Freq: Two times a day (BID) | NASAL | Status: DC

## 2016-01-02 MED ORDER — AMOXICILLIN 875 MG PO TABS: 875.0000 mg | ORAL_TABLET | Freq: Two times a day (BID) | ORAL | Status: DC

## 2016-01-02 MED ORDER — ETODOLAC 500 MG PO TABS: 500.0000 mg | ORAL_TABLET | Freq: Two times a day (BID) | ORAL | Status: DC

## 2016-01-02 MED ORDER — NEOMYCIN-POLYMYXIN-HC 3.5-10000-1 OT SOLN: 3.0000 [drp] | Freq: Three times a day (TID) | OTIC | Status: AC

## 2016-01-02 NOTE — ED Notes (Signed)
See triage note states she has not felt well for couple of days   Bilateral ear pain  Worse on the left   Also cough and body aches

## 2016-01-02 NOTE — Discharge Instructions (Signed)
Ear Drops, Adult You have been diagnosed with a condition requiring you to put drops of medicine into your outer ear. HOME CARE INSTRUCTIONS   Put drops in the affected ear as instructed. After putting the drops in, you will need to lie down with the affected ear facing up for ten minutes so the drops will remain in the ear canal and run down and fill the canal. Continue using the ear drops for as long as directed by your health care provider.  Prior to getting up, put a cotton ball gently in your ear canal. Leave enough of the cotton ball out so it can be easily removed. Do not attempt to push this down into the canal with a cotton-tipped swab or other instrument.  Do not irrigate or wash out your ears if you have had a perforated eardrum or mastoid surgery, or unless instructed to do so by your health care provider.  Keep appointments with your health care provider as instructed.  Finish all medicine, or use for the length of time prescribed by your health care provider. Continue the drops even if your problem seems to be doing well after a couple days, or continue as instructed. SEEK MEDICAL CARE IF:  You become worse or develop increasing pain.  You notice any unusual drainage from your ear (particularly if the drainage has a bad smell).  You develop hearing difficulties.  You experience a serious form of dizziness in which you feel as if the room is spinning, and you feel nauseated (vertigo).  The outside of your ear becomes red or swollen or both. This may be a sign of an allergic reaction. MAKE SURE YOU:   Understand these instructions.  Will watch your condition.  Will get help right away if you are not doing well or get worse.   This information is not intended to replace advice given to you by your health care provider. Make sure you discuss any questions you have with your health care provider.   Document Released: 06/27/2001 Document Revised: 07/24/2014 Document  Reviewed: 01/28/2013 Elsevier Interactive Patient Education 2016 [employer].  Otitis Media, Adult Otitis media is redness, soreness, and inflammation of the middle ear. Otitis media may be caused by allergies or, most commonly, by infection. Often it occurs as a complication of the common cold. SIGNS AND SYMPTOMS Symptoms of otitis media may include:  Earache.  Fever.  Ringing in your ear.  Headache.  Leakage of fluid from the ear. DIAGNOSIS To diagnose otitis media, your health care provider will examine your ear with an otoscope. This is an instrument that allows your health care provider to see into your ear in order to examine your eardrum. Your health care provider also will ask you questions about your symptoms. TREATMENT  Typically, otitis media resolves on its own within 3-5 days. Your health care provider may prescribe medicine to ease your symptoms of pain. If otitis media does not resolve within 5 days or is recurrent, your health care provider may prescribe antibiotic medicines if he or she suspects that a bacterial infection is the cause. HOME CARE INSTRUCTIONS   If you were prescribed an antibiotic medicine, finish it all even if you start to feel better.  Take medicines only as directed by your health care provider.  Keep all follow-up visits as directed by your health care provider. SEEK MEDICAL CARE IF:  You have otitis media only in one ear, or bleeding from your nose, or both.  You notice  a lump on your neck.  You are not getting better in 3-5 days.  You feel worse instead of better. SEEK IMMEDIATE MEDICAL CARE IF:   You have pain that is not controlled with medicine.  You have swelling, redness, or pain around your ear or stiffness in your neck.  You notice that part of your face is paralyzed.  You notice that the bone behind your ear (mastoid) is tender when you touch it. MAKE SURE YOU:   Understand these instructions.  Will watch your  condition.  Will get help right away if you are not doing well or get worse.   This information is not intended to replace advice given to you by your health care provider. Make sure you discuss any questions you have with your health care provider.   Document Released: 04/07/2004 Document Revised: 07/24/2014 Document Reviewed: 01/28/2013 Elsevier Interactive Patient Education [employer].

## 2016-01-02 NOTE — ED Notes (Signed)
Patient presents to the ED with cough and ear pain x 2 days.  Patient states, "both my ears hurt but my left one hurts much worse than my right one."  Patient is in no obvious distress at this time.

## 2016-01-02 NOTE — ED Provider Notes (Signed)
[facility] Emergency Department Provider Note  ____________________________________________  Time seen: Approximately 5:58 PM  I have reviewed the triage vital signs and the nursing notes.   HISTORY  Chief Complaint Otalgia    HPI ANNTIONETTE Graham is a 40 y.o. female who presents emergency department complaining of bilateral ear pain that is worse on the left. Patient states that symptoms have been ongoing for the past 3-4 days. Patient endorses some mild nasal congestion, sore throat, cough, bilateral ear pain that is worse on the left and right. She denies any headache, visual changes, neck pain, chest pain, shortness of breath, nausea or vomiting. Patient has tried over-the-counter eardrops with no relief.   Past Medical History  Diagnosis Date  . DIABETES MELLITUS, TYPE I 02/02/2007  . Anxiety state, unspecified 03/26/2009  . DEPRESSION 02/16/2009  . ASTHMA 02/02/2007  . Shortness of breath 09/25/2007  . GERD 12/21/2008  . Endometriosis     Patient Active Problem List   Diagnosis Date Noted  . Multinodular goiter 07/29/2015  . Type 1 diabetes mellitus with neurological manifestations, uncontrolled ([place]) 01/27/2014  . Restless leg 11/07/2013  . Current tobacco use 11/07/2013  . Triggering of digit 08/13/2013  . Hematuria 03/26/2012  . Chronic obstructive pulmonary disease ([place]) 02/15/2012  . CN (constipation) 01/01/2012  . H/O respiratory system disease 10/04/2011  . Cholesteatoma 09/11/2011  . Allergy to environmental factors 09/11/2011  . Anxiety 05/01/2011  . Myalgia 11/10/2010  . AIRWAY INFLAMMATION 06/08/2010  . Nonspecific (abnormal) findings on radiological and other examination of body structure 05/12/2010  . [place] LUNG FIELD 05/12/2010  . Type 1 diabetes mellitus ([place]) 04/01/2010  . Acid reflux 04/01/2010  . Absence of bladder continence 04/01/2010  . VULVAR ABSCESS 03/18/2010  . UTI 03/09/2010  .  INSOMNIA 02/18/2010  . FOLLICULITIS 14/97/0263  . ANXIETY STATE, UNSPECIFIED 03/26/2009  . NAUSEA 02/19/2009  . DEPRESSION 02/16/2009  . GERD 12/21/2008  . ASTHMATIC BRONCHITIS, ACUTE 08/12/2008  . ACUTE MAXILLARY SINUSITIS 05/04/2008  . MOLE 01/29/2008  . Diarrhea 10/16/2007  . SMOKER 09/25/2007  . SHORTNESS OF BREATH 09/25/2007  . ASTHMA 02/02/2007    Past Surgical History  Procedure Laterality Date  . Abdominal hysterectomy  2009    BSO    Current Outpatient Rx  Name  Route  Sig  Dispense  Refill  . albuterol (PROVENTIL HFA) 108 (90 BASE) MCG/ACT inhaler   Inhalation   Inhale 2 puffs into the lungs every 6 (six) hours as needed. For shortness of breath         . amoxicillin (AMOXIL) 875 MG tablet   Oral   Take 1 tablet (875 mg total) by mouth 2 (two) times daily.   14 tablet   0   . atorvastatin (LIPITOR) 10 MG tablet   Oral   Take 10 mg by mouth at bedtime.         . Blood Glucose Monitoring Suppl (ACCU-CHEK AVIVA PLUS) W/DEVICE KIT   Does not apply   1 Device by Does not apply route once.   1 kit   0   . fluticasone (FLONASE) 50 MCG/ACT nasal spray   Each Nare   Place 1 spray into both nostrils 2 (two) times daily.   16 g   0   . EXPIRED: Fluticasone-Salmeterol (ADVAIR DISKUS) 250-50 MCG/DOSE AEPB   Inhalation   Inhale 1 puff into the lungs 2 (two) times daily. Inhale 1 puff into the lungs 2 (  two) times daily.         Marland Kitchen gabapentin (NEURONTIN) 300 MG capsule      1 po QHS; may increase to 2 po QHS after 1 week if night sweats persist         . glucagon 1 MG injection   Intramuscular   Inject 1 mg into the muscle once as needed.   1 each   12   . glucose blood (ACCU-CHEK AVIVA PLUS) test strip      USE TO TEST BLOOD SUGAR 7 TIMES DAILY, and lancets 7/day. DX E10.41   250 each   11   . ibuprofen (ADVIL,MOTRIN) 800 MG tablet   Oral   Take 800 mg by mouth.         . insulin aspart (NOVOLOG) 100 UNIT/ML injection      40 units daily  per insulin pump (max dosage)   20 mL   11   . Insulin Pen Needle 31G X 8 MM MISC      Use as directed          . lactobacillus acidophilus (BACID) TABS tablet   Oral   Take 2 tablets by mouth 3 (three) times daily.         Marland Kitchen neomycin-polymyxin-hydrocortisone (CORTISPORIN) otic solution   Left Ear   Place 3 drops into the left ear 3 (three) times daily.   10 mL   0   . omeprazole (PRILOSEC) 20 MG capsule   Oral   Take 20 mg by mouth 2 (two) times daily.         Marland Kitchen PARoxetine (PAXIL) 40 MG tablet   Oral   Take 1 tablet (40 mg total) by mouth daily.   30 tablet   0     Appointment needed for further refills   . rifaximin (XIFAXAN) 550 MG TABS tablet   Oral   Take 1 tablet (550 mg total) by mouth 3 (three) times daily.   42 tablet   0   . rOPINIRole (REQUIP) 0.5 MG tablet      TAKE THREE TABLETS BY MOUTH AT BEDTIME   90 tablet   1     Needs appointment for further refills.     Allergies Review of patient's allergies indicates no known allergies.  Family History  Problem Relation Age of Onset  . Diabetes Mother   . Diabetes Maternal Aunt   . Emphysema Maternal Aunt   . Emphysema Maternal Uncle   . Diabetes Maternal Grandfather   . Lung cancer Maternal Aunt   . Colon cancer Neg Hx   . Kidney disease Neg Hx   . Liver disease Neg Hx     Social History Social History  Substance Use Topics  . Smoking status: Current Every Day Smoker -- 1.00 packs/day for 20 years    Types: Cigarettes  . Smokeless tobacco: Never Used     Comment: form given 06/05/14  . Alcohol Use: No     Review of Systems  Constitutional: No fever/chills Eyes: No visual changes. No discharge ENT: Positive for bilateral ear pain that is worse on the left. Positive for mild nasal congestion. Positive for scratchy throat. Cardiovascular: no chest pain. Respiratory: Positive cough. No SOB. Gastrointestinal: No abdominal pain.  No nausea, no vomiting.  No diarrhea.  No  constipation. Musculoskeletal: Negative for musculoskeletal pain. Skin: Negative for rash, abrasions, lacerations, ecchymosis. Neurological: Negative for headaches, focal weakness or numbness. 10-point ROS otherwise negative.  ____________________________________________  PHYSICAL EXAM:  VITAL SIGNS: ED Triage Vitals  Enc Vitals Group     BP 01/02/16 1732 139/87 mmHg     Pulse Rate 01/02/16 1732 98     Resp 01/02/16 1732 18     Temp 01/02/16 1732 98.1 F (36.7 C)     Temp Source 01/02/16 1732 Oral     SpO2 01/02/16 1732 99 %     Weight 01/02/16 1732 130 lb (58.968 kg)     Height 01/02/16 1732 '5\' 6"'  (1.676 m)     Head Cir --      Peak Flow --      Pain Score 01/02/16 1732 8     Pain Loc --      Pain Edu? --      Excl. in [place]? --      Constitutional: Alert and oriented. Well appearing and in no acute distress. Eyes: Conjunctivae are normal. PERRL. EOMI. Head: Atraumatic. ENT:      Ears: EACs on right is unremarkable. TM is dusky in appearance, bulging, with mucoid air-fluid level identified. EC on the left is slightly erythematous and edematous. TM is visualized and is dusky in appearance, bulging, with mucoid air-fluid level identified. No drainage noted from bilateral ears.      Nose: Mild congestion/rhinnorhea. Turbinates are erythematous and edematous. Sinuses are nontender to percussion.      Mouth/Throat: Mucous membranes are moist. Oropharynx is mildly erythematous but nonedematous. Uvula is midline. Tonsils are unremarkable bilaterally. Neck: No stridor.   Hematological/Lymphatic/Immunilogical: No cervical lymphadenopathy. Cardiovascular: Normal rate, regular rhythm. Normal S1 and S2.  Good peripheral circulation. Respiratory: Normal respiratory effort without tachypnea or retractions. Lungs CTAB. Good air entry to the bases with no decreased or absent breath sounds. Musculoskeletal: Full range of motion to all extremities. No gross deformities  appreciated. Neurologic:  Normal speech and language. No gross focal neurologic deficits are appreciated.  Skin:  Skin is warm, dry and intact. No rash noted. Psychiatric: Mood and affect are normal. Speech and behavior are normal. Patient exhibits appropriate insight and judgement.   ____________________________________________   LABS (all labs ordered are listed, but only abnormal results are displayed)  Labs Reviewed - No data to display ____________________________________________  EKG   ____________________________________________  RADIOLOGY   No results found.  ____________________________________________    PROCEDURES  Procedure(s) performed:       Medications - No data to display   ____________________________________________   INITIAL IMPRESSION / ASSESSMENT AND PLAN / ED COURSE  Pertinent labs & imaging results that were available during my care of the patient were reviewed by me and considered in my medical decision making (see chart for details).  Patient's diagnosis is consistent with bilateral otitis media from eustachian tube dysfunction from allergic rhinitis. Patient also has otitis externa to the left ear.. Patient will be discharged home with prescriptions for oral antibiotics, and back eardrops, and Flonase. Patient is to follow up with primary care as needed or otherwise directed. Patient is given ED precautions to return to the ED for any worsening or new symptoms.     ____________________________________________  FINAL CLINICAL IMPRESSION(S) / ED DIAGNOSES  Final diagnoses:  Acute mucoid otitis media of both ears  Allergic rhinitis, unspecified allergic rhinitis type  Eustachian tube dysfunction, bilateral  Otitis externa, left      NEW MEDICATIONS STARTED DURING THIS VISIT:  New Prescriptions   AMOXICILLIN (AMOXIL) 875 MG TABLET    Take 1 tablet (875 mg total) by mouth 2 (two)  times daily.   FLUTICASONE (FLONASE) 50 MCG/ACT  NASAL SPRAY    Place 1 spray into both nostrils 2 (two) times daily.   NEOMYCIN-POLYMYXIN-HYDROCORTISONE (CORTISPORIN) OTIC SOLUTION    Place 3 drops into the left ear 3 (three) times daily.        This chart was dictated using voice recognition software/Dragon. Despite best efforts to proofread, errors can occur which can change the meaning. Any change was purely unintentional.    Darletta Moll, PA-C 01/02/16 1812  Harvest Dark, MD 01/02/16 2329

## 2016-01-04 NOTE — Telephone Encounter (Signed)
Opened in error

## 2016-01-31 ENCOUNTER — Ambulatory Visit: Payer: Medicaid Other | Admitting: Endocrinology

## 2016-02-01 ENCOUNTER — Telehealth: Payer: Self-pay | Admitting: Endocrinology

## 2016-02-01 NOTE — Telephone Encounter (Signed)
Patient no showed today's appt. Please advise on how to follow up. °A. No follow up necessary. °B. Follow up urgent. Contact patient immediately. °C. Follow up necessary. Contact patient and schedule visit in ___ days. °D. Follow up advised. Contact patient and schedule visit in ____weeks. ° °

## 2016-02-01 NOTE — Telephone Encounter (Signed)
Please come back for a follow-up appointment in 6 weeks  

## 2016-02-03 NOTE — Telephone Encounter (Signed)
Called PT No answer.

## 2016-03-07 ENCOUNTER — Ambulatory Visit: Payer: Medicaid Other | Admitting: Endocrinology

## 2016-03-08 ENCOUNTER — Telehealth: Payer: Self-pay | Admitting: Endocrinology

## 2016-03-08 NOTE — Telephone Encounter (Signed)
Patient no showed today's appt. Please advise on how to follow up. °A. No follow up necessary. °B. Follow up urgent. Contact patient immediately. °C. Follow up necessary. Contact patient and schedule visit in ___ days. °D. Follow up advised. Contact patient and schedule visit in ____weeks. ° °

## 2016-03-08 NOTE — Telephone Encounter (Signed)
Please come back for a follow-up appointment in 1 month.  

## 2016-03-09 NOTE — Telephone Encounter (Signed)
Caitlin,  Could you please contact the pt to reschedule. Thanks!  

## 2016-03-10 ENCOUNTER — Emergency Department
Admission: EM | Admit: 2016-03-10 | Discharge: 2016-03-10 | Disposition: A | Discharge status: Home / Self Care | Payer: Medicaid Other | Attending: Emergency Medicine | Admitting: Emergency Medicine

## 2016-03-10 ENCOUNTER — Encounter: Payer: Self-pay | Admitting: Emergency Medicine

## 2016-03-10 DIAGNOSIS — J45909 Unspecified asthma, uncomplicated: Secondary | ICD-10-CM | POA: Insufficient documentation

## 2016-03-10 DIAGNOSIS — Z7951 Long term (current) use of inhaled steroids: Secondary | ICD-10-CM | POA: Insufficient documentation

## 2016-03-10 DIAGNOSIS — F17210 Nicotine dependence, cigarettes, uncomplicated: Secondary | ICD-10-CM | POA: Insufficient documentation

## 2016-03-10 DIAGNOSIS — E109 Type 1 diabetes mellitus without complications: Secondary | ICD-10-CM | POA: Insufficient documentation

## 2016-03-10 DIAGNOSIS — Z794 Long term (current) use of insulin: Secondary | ICD-10-CM | POA: Insufficient documentation

## 2016-03-10 DIAGNOSIS — M545 Low back pain, unspecified: Secondary | ICD-10-CM

## 2016-03-10 DIAGNOSIS — Z79899 Other long term (current) drug therapy: Secondary | ICD-10-CM | POA: Insufficient documentation

## 2016-03-10 DIAGNOSIS — Z791 Long term (current) use of non-steroidal anti-inflammatories (NSAID): Secondary | ICD-10-CM | POA: Diagnosis not present

## 2016-03-10 DIAGNOSIS — J449 Chronic obstructive pulmonary disease, unspecified: Secondary | ICD-10-CM | POA: Insufficient documentation

## 2016-03-10 LAB — URINALYSIS COMPLETE WITH MICROSCOPIC (ARMC ONLY)
BILIRUBIN URINE: NEGATIVE
Bacteria, UA: NONE SEEN
GLUCOSE, UA: 150 mg/dL — AB
Ketones, ur: NEGATIVE mg/dL
LEUKOCYTES UA: NEGATIVE
NITRITE: NEGATIVE
PH: 6 (ref 5.0–8.0)
Protein, ur: NEGATIVE mg/dL
SPECIFIC GRAVITY, URINE: 1.002 — AB (ref 1.005–1.030)

## 2016-03-10 LAB — POCT PREGNANCY, URINE: Preg Test, Ur: NEGATIVE

## 2016-03-10 MED ORDER — CYCLOBENZAPRINE HCL 5 MG PO TABS: 5.0000 mg | ORAL_TABLET | Freq: Three times a day (TID) | ORAL | 0 refills | Status: DC | PRN

## 2016-03-10 NOTE — ED Triage Notes (Signed)
Pt In with co lower back pain that radiates into buttocks and legs bilat, no know injury. Pt has difficulty walking due to pain.

## 2016-03-10 NOTE — ED Notes (Signed)
Pt given drink per request. Pt asking for work note, MD notified.

## 2016-03-10 NOTE — ED Notes (Signed)
Pt c/o lower back pain that radiates to her flanks X 2 days.  Pt denies injury to area.  Pt c/o nausea, denies vomiting. Pt denies changes in urination. Pt reports hx of type 1 diabetes.

## 2016-03-10 NOTE — ED Provider Notes (Signed)
[facility] Emergency Department Provider Note    ____________________________________________   I have reviewed the triage vital signs and the nursing notes.   HISTORY  Chief Complaint Back Pain   History limited by: Not Limited   HPI [name] is a 40 y.o. female who presents to the emergency department today because of concerns for back pain. She states that she is started developing back pain over the past week. It is usually present at night. It starts roughly 2-3 hours after she goes to bed. After she wakes up it is still present however it starts easing off when she starts moving around. She has been sleeping on an old lumpy mattress although tried sleeping on a harder mattress last night. She has had some increase in urinary frequency although denies any dysuria or bad odor to her urine. She denies any fevers.    Past Medical History:  Diagnosis Date  . Anxiety state, unspecified 03/26/2009  . ASTHMA 02/02/2007  . DEPRESSION 02/16/2009  . DIABETES MELLITUS, TYPE I 02/02/2007  . Endometriosis   . GERD 12/21/2008  . Shortness of breath 09/25/2007    Patient Active Problem List   Diagnosis Date Noted  . Multinodular goiter 07/29/2015  . Type 1 diabetes mellitus with neurological manifestations, uncontrolled ([place]) 01/27/2014  . Restless leg 11/07/2013  . Current tobacco use 11/07/2013  . Triggering of digit 08/13/2013  . Hematuria 03/26/2012  . Chronic obstructive pulmonary disease ([place]) 02/15/2012  . CN (constipation) 01/01/2012  . H/O respiratory system disease 10/04/2011  . Cholesteatoma 09/11/2011  . Allergy to environmental factors 09/11/2011  . Anxiety 05/01/2011  . Myalgia 11/10/2010  . AIRWAY INFLAMMATION 06/08/2010  . Nonspecific (abnormal) findings on radiological and other examination of body structure 05/12/2010  . [place] LUNG FIELD 05/12/2010  . Type 1 diabetes mellitus ([place]) 04/01/2010  . Acid  reflux 04/01/2010  . Absence of bladder continence 04/01/2010  . VULVAR ABSCESS 03/18/2010  . UTI 03/09/2010  . INSOMNIA 02/18/2010  . FOLLICULITIS 14/97/0263  . ANXIETY STATE, UNSPECIFIED 03/26/2009  . NAUSEA 02/19/2009  . DEPRESSION 02/16/2009  . GERD 12/21/2008  . ASTHMATIC BRONCHITIS, ACUTE 08/12/2008  . ACUTE MAXILLARY SINUSITIS 05/04/2008  . MOLE 01/29/2008  . Diarrhea 10/16/2007  . SMOKER 09/25/2007  . SHORTNESS OF BREATH 09/25/2007  . ASTHMA 02/02/2007    Past Surgical History:  Procedure Laterality Date  . ABDOMINAL HYSTERECTOMY  2009   BSO    Prior to Admission medications   Medication Sig Start Date End Date Taking? Authorizing Provider  albuterol (PROVENTIL HFA) 108 (90 BASE) MCG/ACT inhaler Inhale 2 puffs into the lungs every 6 (six) hours as needed. For shortness of breath    Historical Provider, MD  amoxicillin (AMOXIL) 875 MG tablet Take 1 tablet (875 mg total) by mouth 2 (two) times daily. 01/02/16   Charline Bills Cuthriell, PA-C  atorvastatin (LIPITOR) 10 MG tablet Take 10 mg by mouth at bedtime.    Historical Provider, MD  Blood Glucose Monitoring Suppl (ACCU-CHEK AVIVA PLUS) W/DEVICE KIT 1 Device by Does not apply route once. 01/27/14   Renato Shin, MD  etodolac (LODINE) 500 MG tablet Take 1 tablet (500 mg total) by mouth 2 (two) times daily. 01/02/16   Charline Bills Cuthriell, PA-C  fluticasone (FLONASE) 50 MCG/ACT nasal spray Place 1 spray into both nostrils 2 (two) times daily. 01/02/16   Charline Bills Cuthriell, PA-C  Fluticasone-Salmeterol (ADVAIR DISKUS) 250-50 MCG/DOSE AEPB Inhale 1 puff  into the lungs 2 (two) times daily. Inhale 1 puff into the lungs 2 (two) times daily. 02/15/12 12/07/15  Historical Provider, MD  gabapentin (NEURONTIN) 300 MG capsule 1 po QHS; may increase to 2 po QHS after 1 week if night sweats persist 12/03/13   Historical Provider, MD  glucagon 1 MG injection Inject 1 mg into the muscle once as needed. 07/29/15   Renato Shin, MD  glucose blood  (ACCU-CHEK AVIVA PLUS) test strip USE TO TEST BLOOD SUGAR 7 TIMES DAILY, and lancets 7/day. DX E10.41 07/29/15   Renato Shin, MD  ibuprofen (ADVIL,MOTRIN) 800 MG tablet Take 800 mg by mouth. 11/07/13   Historical Provider, MD  insulin aspart (NOVOLOG) 100 UNIT/ML injection 40 units daily per insulin pump (max dosage) 12/31/15   Renato Shin, MD  Insulin Pen Needle 31G X 8 MM MISC Use as directed     Historical Provider, MD  lactobacillus acidophilus (BACID) TABS tablet Take 2 tablets by mouth 3 (three) times daily.    Historical Provider, MD  omeprazole (PRILOSEC) 20 MG capsule Take 20 mg by mouth 2 (two) times daily. 05/08/12   Renato Shin, MD  PARoxetine (PAXIL) 40 MG tablet Take 1 tablet (40 mg total) by mouth daily. 11/10/13   Renato Shin, MD  rifaximin (XIFAXAN) 550 MG TABS tablet Take 1 tablet (550 mg total) by mouth 3 (three) times daily. 06/15/15   Manus Gunning, MD  rOPINIRole (REQUIP) 0.5 MG tablet TAKE THREE TABLETS BY MOUTH AT BEDTIME 08/20/13   Renato Shin, MD    Allergies Review of patient's allergies indicates no known allergies.  Family History  Problem Relation Age of Onset  . Diabetes Mother   . Diabetes Maternal Aunt   . Emphysema Maternal Aunt   . Emphysema Maternal Uncle   . Diabetes Maternal Grandfather   . Lung cancer Maternal Aunt   . Colon cancer Neg Hx   . Kidney disease Neg Hx   . Liver disease Neg Hx     Social History Social History  Substance Use Topics  . Smoking status: Current Every Day Smoker    Packs/day: 1.00    Years: 20.00    Types: Cigarettes  . Smokeless tobacco: Never Used     Comment: form given 06/05/14  . Alcohol use No    Review of Systems  Constitutional: Negative for fever. Cardiovascular: Negative for chest pain. Respiratory: Negative for shortness of breath. Gastrointestinal: Negative for abdominal pain, vomiting and diarrhea. Genitourinary: Negative for dysuria. Increased urinary frequency Musculoskeletal:  Positive for low back pain Skin: Negative for rash. Neurological: Negative for headaches, focal weakness or numbness.  10-point ROS otherwise negative.  ____________________________________________   PHYSICAL EXAM:  VITAL SIGNS: ED Triage Vitals [03/10/16 0625]  Enc Vitals Group     BP (!) 176/95     Pulse Rate 98     Resp 18     Temp 98 F (36.7 C)     Temp Source Oral     SpO2 100 %     Weight 130 lb (59 kg)     Height _0  (1.651 m)     Head Circumference      Peak Flow      Pain Score 9   Constitutional: Alert and oriented. Well appearing and in no distress. Eyes: Conjunctivae are normal. PERRL. Normal extraocular movements. ENT   Head: Normocephalic and atraumatic.   Nose: No congestion/rhinnorhea.   Mouth/Throat: Mucous membranes are moist.   Neck: No stridor.  Hematological/Lymphatic/Immunilogical: No cervical lymphadenopathy. Cardiovascular: Normal rate, regular rhythm.  No murmurs, rubs, or gallops. Respiratory: Normal respiratory effort without tachypnea nor retractions. Breath sounds are clear and equal bilaterally. No wheezes/rales/rhonchi. Gastrointestinal: Soft and nontender. No distention. There is no CVA tenderness. Genitourinary: Deferred Musculoskeletal: Normal range of motion in all extremities. No joint effusions.  No lower extremity tenderness nor edema. Neurologic:  Normal speech and language. No gross focal neurologic deficits are appreciated.  Skin:  Skin is warm, dry and intact. No rash noted. Psychiatric: Mood and affect are normal. Speech and behavior are normal. Patient exhibits appropriate insight and judgment.  ____________________________________________    LABS (pertinent positives/negatives)  Labs Reviewed  URINALYSIS COMPLETEWITH MICROSCOPIC ([place] ONLY) - Abnormal; Notable for the following:       Result Value   Color, Urine COLORLESS (*)    APPearance CLEAR (*)    Glucose, UA 150 (*)    Specific Gravity, Urine  1.002 (*)    Hgb urine dipstick 1+ (*)    Squamous Epithelial / LPF 0-5 (*)    All other components within normal limits  POCT PREGNANCY, URINE     ____________________________________________   EKG  None  ____________________________________________    RADIOLOGY  None  ____________________________________________   PROCEDURES  Procedures  ____________________________________________   INITIAL IMPRESSION / ASSESSMENT AND PLAN / ED COURSE  Pertinent labs & imaging results that were available during my care of the patient were reviewed by me and considered in my medical decision making (see chart for details).  Patient presents with low back pain that seems to be related to the patient's sleeping situation. However given increased urinary frequency will check UA.  Clinical Course   UA without concerning findings. Think likely musculoskeletal pain. Will give the patient muscle relaxer. Will give information on exercises to try to help prevent back pain. ____________________________________________   FINAL CLINICAL IMPRESSION(S) / ED DIAGNOSES  Final diagnoses:  Bilateral low back pain without sciatica     Note: This dictation was prepared with Dragon dictation. Any transcriptional errors that result from this process are unintentional    Nance Pear, MD 03/10/16 [phone]

## 2016-03-10 NOTE — Discharge Instructions (Signed)
Please seek medical attention for any high fevers, chest pain, shortness of breath, change in behavior, persistent vomiting, bloody stool or any other new or concerning symptoms.  

## 2016-03-10 NOTE — ED Notes (Signed)
Pt tender to palpation of right flank.

## 2016-03-10 NOTE — Progress Notes (Addendum)
Inpatient Diabetes Program Recommendations  AACE/ADA: New Consensus Statement on Inpatient Glycemic Control (2015)  Target Ranges:  Prepandial:   less than 140 mg/dL      Peak postprandial:   less than 180 mg/dL (1-2 hours)      Critically ill patients:  140 - 180 mg/dL   Lab Results  Component Value Date   GLUCAP 260 (H) 02/08/2013   HGBA1C 9.3 12/31/2015    Review of Glycemic Control  Spoke to RN re: patient in the ED, has Type 1 diabetes and RN states she is wearing her insulin pump.   Spoke to patient by phone- her insulin pump site was changed Wednesday evening and her last blood sugar (she checked) was 170mg /dl around 7:30am.   She checks sugars every time she eats, about 3-4 times per day.  Basal setting on her insulin pump per MD note are; basal rate of 0.4 units/hr, 24 hrs per day.  mealtime bolus of 1 unit/ 20 grams carbohydrate.  correction bolus (which some people call "sensitivity," or "insulin sensitivity ratio," or just "isr") of 1 unit for each 100 by which your glucose exceeds 100.   Gentry Fitz, RN, BA, MHA, CDE Diabetes Coordinator Inpatient Diabetes Program  [phone] (Team Pager) [phone] ([place]) 03/10/2016 8:11 AM

## 2016-03-14 ENCOUNTER — Ambulatory Visit: Payer: Medicaid Other | Admitting: Endocrinology

## 2016-03-14 DIAGNOSIS — Z0289 Encounter for other administrative examinations: Secondary | ICD-10-CM

## 2016-05-02 ENCOUNTER — Emergency Department: Payer: Medicaid Other

## 2016-05-02 ENCOUNTER — Encounter: Payer: Self-pay | Admitting: *Deleted

## 2016-05-02 ENCOUNTER — Emergency Department
Admission: EM | Admit: 2016-05-02 | Discharge: 2016-05-02 | Disposition: A | Discharge status: Home / Self Care | Payer: Medicaid Other | Attending: Emergency Medicine | Admitting: Emergency Medicine

## 2016-05-02 DIAGNOSIS — Z791 Long term (current) use of non-steroidal anti-inflammatories (NSAID): Secondary | ICD-10-CM | POA: Insufficient documentation

## 2016-05-02 DIAGNOSIS — J449 Chronic obstructive pulmonary disease, unspecified: Secondary | ICD-10-CM | POA: Diagnosis not present

## 2016-05-02 DIAGNOSIS — J45909 Unspecified asthma, uncomplicated: Secondary | ICD-10-CM | POA: Diagnosis not present

## 2016-05-02 DIAGNOSIS — R05 Cough: Secondary | ICD-10-CM | POA: Diagnosis present

## 2016-05-02 DIAGNOSIS — Z79899 Other long term (current) drug therapy: Secondary | ICD-10-CM | POA: Diagnosis not present

## 2016-05-02 DIAGNOSIS — F17210 Nicotine dependence, cigarettes, uncomplicated: Secondary | ICD-10-CM | POA: Diagnosis not present

## 2016-05-02 DIAGNOSIS — J069 Acute upper respiratory infection, unspecified: Secondary | ICD-10-CM

## 2016-05-02 DIAGNOSIS — E109 Type 1 diabetes mellitus without complications: Secondary | ICD-10-CM | POA: Insufficient documentation

## 2016-05-02 DIAGNOSIS — M940 Chondrocostal junction syndrome [Tietze]: Secondary | ICD-10-CM | POA: Insufficient documentation

## 2016-05-02 DIAGNOSIS — B9789 Other viral agents as the cause of diseases classified elsewhere: Secondary | ICD-10-CM

## 2016-05-02 MED ORDER — ALBUTEROL SULFATE HFA 108 (90 BASE) MCG/ACT IN AERS: 2.0000 | INHALATION_SPRAY | RESPIRATORY_TRACT | 0 refills | Status: DC | PRN

## 2016-05-02 MED ORDER — PSEUDOEPH-BROMPHEN-DM 30-2-10 MG/5ML PO SYRP: 10.0000 mL | ORAL_SOLUTION | Freq: Four times a day (QID) | ORAL | 0 refills | Status: DC | PRN

## 2016-05-02 MED ORDER — PREDNISONE 50 MG PO TABS: 50.0000 mg | ORAL_TABLET | Freq: Every day | ORAL | 0 refills | Status: DC

## 2016-05-02 NOTE — ED Notes (Signed)
Pt verbalized understanding of discharge instructions. NAD at this time. 

## 2016-05-02 NOTE — ED Provider Notes (Signed)
[facility] Emergency Department Provider Note  ____________________________________________  Time seen: Approximately 5:50 PM  I have reviewed the triage vital signs and the nursing notes.   HISTORY  Chief Complaint Cough; Chills; and Fever    HPI [name] is a 40 y.o. female who presents to emergency department complaining of a week's history of nasal congestion, scratchy throat, coughing. Patient states that she is now also experiencing sharp right sided rib pain from coughing so much. Patient denies any headache, visual changes, chest pain, shortness of breath. Patient does endorse smoking. She denies any fevers or chills, abdominal pain, nausea vomiting.   Past Medical History:  Diagnosis Date  . Anxiety state, unspecified 03/26/2009  . ASTHMA 02/02/2007  . DEPRESSION 02/16/2009  . DIABETES MELLITUS, TYPE I 02/02/2007  . Endometriosis   . GERD 12/21/2008  . Shortness of breath 09/25/2007    Patient Active Problem List   Diagnosis Date Noted  . Multinodular goiter 07/29/2015  . Type 1 diabetes mellitus with neurological manifestations, uncontrolled ([place]) 01/27/2014  . Restless leg 11/07/2013  . Current tobacco use 11/07/2013  . Triggering of digit 08/13/2013  . Hematuria 03/26/2012  . Chronic obstructive pulmonary disease ([place]) 02/15/2012  . CN (constipation) 01/01/2012  . H/O respiratory system disease 10/04/2011  . Cholesteatoma 09/11/2011  . Allergy to environmental factors 09/11/2011  . Anxiety 05/01/2011  . Myalgia 11/10/2010  . AIRWAY INFLAMMATION 06/08/2010  . Nonspecific (abnormal) findings on radiological and other examination of body structure 05/12/2010  . [place] LUNG FIELD 05/12/2010  . Type 1 diabetes mellitus ([place]) 04/01/2010  . Acid reflux 04/01/2010  . Absence of bladder continence 04/01/2010  . VULVAR ABSCESS 03/18/2010  . UTI 03/09/2010  . INSOMNIA 02/18/2010  . FOLLICULITIS 36/46/8032   . ANXIETY STATE, UNSPECIFIED 03/26/2009  . NAUSEA 02/19/2009  . DEPRESSION 02/16/2009  . GERD 12/21/2008  . ASTHMATIC BRONCHITIS, ACUTE 08/12/2008  . ACUTE MAXILLARY SINUSITIS 05/04/2008  . MOLE 01/29/2008  . Diarrhea 10/16/2007  . SMOKER 09/25/2007  . SHORTNESS OF BREATH 09/25/2007  . ASTHMA 02/02/2007    Past Surgical History:  Procedure Laterality Date  . ABDOMINAL HYSTERECTOMY  2009   BSO    Prior to Admission medications   Medication Sig Start Date End Date Taking? Authorizing Provider  clonazePAM (KLONOPIN) 1 MG tablet Take 1 mg by mouth 2 (two) times daily as needed for anxiety.   Yes Historical Provider, MD  HYDROcodone-acetaminophen (NORCO/VICODIN) 5-325 MG tablet Take 1 tablet by mouth every 6 (six) hours as needed for moderate pain.   Yes Historical Provider, MD  albuterol (PROVENTIL HFA;VENTOLIN HFA) 108 (90 Base) MCG/ACT inhaler Inhale 2 puffs into the lungs every 4 (four) hours as needed for wheezing or shortness of breath. 05/02/16   Charline Bills Khristin Keleher, PA-C  atorvastatin (LIPITOR) 10 MG tablet Take 10 mg by mouth at bedtime.    Historical Provider, MD  Blood Glucose Monitoring Suppl (ACCU-CHEK AVIVA PLUS) W/DEVICE KIT 1 Device by Does not apply route once. 01/27/14   Renato Shin, MD  brompheniramine-pseudoephedrine-DM 30-2-10 MG/5ML syrup Take 10 mLs by mouth 4 (four) times daily as needed. 05/02/16   Charline Bills Neliah Cuyler, PA-C  cyclobenzaprine (FLEXERIL) 5 MG tablet Take 1 tablet (5 mg total) by mouth 3 (three) times daily as needed for muscle spasms. 03/10/16   Nance Pear, MD  gabapentin (NEURONTIN) 300 MG capsule 1 po QHS; may increase to 2 po QHS after 1 week if night sweats  persist 12/03/13   Historical Provider, MD  glucagon 1 MG injection Inject 1 mg into the muscle once as needed. 07/29/15   Renato Shin, MD  glucose blood (ACCU-CHEK AVIVA PLUS) test strip USE TO TEST BLOOD SUGAR 7 TIMES DAILY, and lancets 7/day. DX E10.41 07/29/15   Renato Shin, MD   ibuprofen (ADVIL,MOTRIN) 800 MG tablet Take 800 mg by mouth. 11/07/13   Historical Provider, MD  insulin aspart (NOVOLOG) 100 UNIT/ML injection 40 units daily per insulin pump (max dosage) 12/31/15   Renato Shin, MD  Insulin Pen Needle 31G X 8 MM MISC Use as directed     Historical Provider, MD  lactobacillus acidophilus (BACID) TABS tablet Take 2 tablets by mouth 3 (three) times daily.    Historical Provider, MD  omeprazole (PRILOSEC) 20 MG capsule Take 20 mg by mouth 2 (two) times daily. 05/08/12   Renato Shin, MD  PARoxetine (PAXIL) 40 MG tablet Take 1 tablet (40 mg total) by mouth daily. 11/10/13   Renato Shin, MD  predniSONE (DELTASONE) 50 MG tablet Take 1 tablet (50 mg total) by mouth daily with breakfast. 05/02/16   Charline Bills Kiani Wurtzel, PA-C  rifaximin (XIFAXAN) 550 MG TABS tablet Take 1 tablet (550 mg total) by mouth 3 (three) times daily. 06/15/15   Manus Gunning, MD  rOPINIRole (REQUIP) 0.5 MG tablet TAKE THREE TABLETS BY MOUTH AT BEDTIME 08/20/13   Renato Shin, MD    Allergies Review of patient's allergies indicates no known allergies.  Family History  Problem Relation Age of Onset  . Diabetes Mother   . Diabetes Maternal Aunt   . Emphysema Maternal Aunt   . Emphysema Maternal Uncle   . Diabetes Maternal Grandfather   . Lung cancer Maternal Aunt   . Colon cancer Neg Hx   . Kidney disease Neg Hx   . Liver disease Neg Hx     Social History Social History  Substance Use Topics  . Smoking status: Current Every Day Smoker    Packs/day: 1.00    Years: 20.00    Types: Cigarettes  . Smokeless tobacco: Never Used     Comment: form given 06/05/14  . Alcohol use No     Review of Systems  Constitutional: No fever/chills Eyes: No visual changes. No discharge [id number] for nasal congestion. Positive for scratchy throat. Cardiovascular: no chest pain. Respiratory: Positive cough. No SOB. Gastrointestinal: No abdominal pain.  No nausea, no vomiting.  No  diarrhea.  No constipation. Musculoskeletal: Positive for right-sided rib pain Skin: Negative for rash, abrasions, lacerations, ecchymosis. Neurological: Negative for headaches, focal weakness or numbness. 10-point ROS otherwise negative.  ____________________________________________   PHYSICAL EXAM:  VITAL SIGNS: ED Triage Vitals [05/02/16 1704]  Enc Vitals Group     BP (!) 140/93     Pulse Rate 79     Resp 20     Temp 97.8 F (36.6 C)     Temp Source Oral     SpO2 100 %     Weight 130 lb (59 kg)     Height      Head Circumference      Peak Flow      Pain Score 7     Pain Loc      Pain Edu?      Excl. in [place]?      Constitutional: Alert and oriented. Well appearing and in no acute distress. Eyes: Conjunctivae are normal. PERRL. EOMI. Head: Atraumatic. ENT:      Ears:  Nose: Mild congestion/rhinnorhea.      Mouth/Throat: Mucous membranes are moist. Oropharynx shows signs of postnasal drip. No significant erythema or edema. Uvula is midline. Neck: No stridor. Full range of motion Hematological/Lymphatic/Immunilogical: No cervical lymphadenopathy. Cardiovascular: Normal rate, regular rhythm. Normal S1 and S2.  Good peripheral circulation. Respiratory: Normal respiratory effort without tachypnea or retractions. Lungs scattered coarse breath sounds but no definitive wheezing, rales, or rhonchi. Good air entry to the bases with no decreased or absent breath sounds. Musculoskeletal: Full range of motion to all extremities. No gross deformities appreciated. No deformities noted to visible inspection. No flail segments or paradoxical chest wall movement. Patient is diffusely tender to palpation along the intercostal margin of the sixth rib. No palpable abnormality. Neurologic:  Normal speech and language. No gross focal neurologic deficits are appreciated.  Skin:  Skin is warm, dry and intact. No rash noted. Psychiatric: Mood and affect are normal. Speech and behavior are  normal. Patient exhibits appropriate insight and judgement.   ____________________________________________   LABS (all labs ordered are listed, but only abnormal results are displayed)  Labs Reviewed - No data to display ____________________________________________  EKG   ____________________________________________  RADIOLOGY Diamantina [place] Jasmene Goswami, personally viewed and evaluated these images (plain radiographs) as part of my medical decision making, as well as reviewing the written report by the radiologist.  Dg Chest 2 View  Result Date: 05/02/2016 CLINICAL DATA:  One week.  Fever. EXAM: CHEST  2 VIEW COMPARISON:  Chest radiograph 11/01/2015 FINDINGS: Cardiomediastinal contours are normal. No pneumothorax or pleural effusion. No focal airspace consolidation or pulmonary edema. IMPRESSION: Clear lungs. Electronically Signed   By: Ulyses Jarred M.D.   On: 05/02/2016 17:46    ____________________________________________    PROCEDURES  Procedure(s) performed:    Procedures    Medications - No data to display   ____________________________________________   INITIAL IMPRESSION / ASSESSMENT AND PLAN / ED COURSE  Pertinent labs & imaging results that were available during my care of the patient were reviewed by me and considered in my medical decision making (see chart for details).  Review of the [place] CSRS was performed in accordance of the [place] prior to dispensing any controlled drugs.  Clinical Course    Patient's diagnosis is consistent with Viral respiratory infection resulting in a cough that has resulted in costochondritis. X-ray reveals no acute cardiopulmonary abnormality. Exam is reassuring.. Patient will be discharged home with prescriptions for steroids, albuterol, Cozaar. Patient is to follow up with primary care as needed or otherwise directed. Patient is given ED precautions to return to the ED for any worsening or new  symptoms.     ____________________________________________  FINAL CLINICAL IMPRESSION(S) / ED DIAGNOSES  Final diagnoses:  Viral upper respiratory tract infection with cough  Costochondritis, acute      NEW MEDICATIONS STARTED DURING THIS VISIT:  New Prescriptions   ALBUTEROL (PROVENTIL HFA;VENTOLIN HFA) 108 (90 BASE) MCG/ACT INHALER    Inhale 2 puffs into the lungs every 4 (four) hours as needed for wheezing or shortness of breath.   BROMPHENIRAMINE-PSEUDOEPHEDRINE-DM 30-2-10 MG/5ML SYRUP    Take 10 mLs by mouth 4 (four) times daily as needed.   PREDNISONE (DELTASONE) 50 MG TABLET    Take 1 tablet (50 mg total) by mouth daily with breakfast.        This chart was dictated using voice recognition software/Dragon. Despite best efforts to proofread, errors can occur which can change the meaning. Any change was purely unintentional.  Charline Bills Marilu Rylander, PA-C 05/02/16 [place], MD 05/03/16 [phone]

## 2016-05-02 NOTE — ED Triage Notes (Signed)
Pt to ed with c/o cough, congestion, fever, chills x 1 week.  Pt states chest soreness with coughing.  +smoker

## 2016-05-02 NOTE — ED Notes (Signed)
States she has had body aches   Congestion and cough for about 1 week  Began feeling worse over the past 2 days unsure of fever but has been having chills   Cough is prod  Yellowish phlegm

## 2016-05-08 MED ORDER — NITROGLYCERIN 2 % TD OINT
TOPICAL_OINTMENT | TRANSDERMAL | Status: AC
  Filled 2016-05-08: qty 1

## 2016-05-08 MED ORDER — CEFTRIAXONE SODIUM-DEXTROSE 1-3.74 GM-% IV SOLR
INTRAVENOUS | Status: AC
  Filled 2016-05-08: qty 50

## 2016-05-12 ENCOUNTER — Encounter: Payer: Self-pay | Admitting: *Deleted

## 2016-05-12 ENCOUNTER — Emergency Department: Payer: Medicaid Other

## 2016-05-12 ENCOUNTER — Emergency Department
Admission: EM | Admit: 2016-05-12 | Discharge: 2016-05-12 | Disposition: A | Discharge status: Home / Self Care | Payer: Medicaid Other | Attending: Emergency Medicine | Admitting: Emergency Medicine

## 2016-05-12 DIAGNOSIS — E109 Type 1 diabetes mellitus without complications: Secondary | ICD-10-CM | POA: Insufficient documentation

## 2016-05-12 DIAGNOSIS — J069 Acute upper respiratory infection, unspecified: Secondary | ICD-10-CM | POA: Insufficient documentation

## 2016-05-12 DIAGNOSIS — Z79899 Other long term (current) drug therapy: Secondary | ICD-10-CM | POA: Insufficient documentation

## 2016-05-12 DIAGNOSIS — F17210 Nicotine dependence, cigarettes, uncomplicated: Secondary | ICD-10-CM | POA: Diagnosis not present

## 2016-05-12 DIAGNOSIS — Z791 Long term (current) use of non-steroidal anti-inflammatories (NSAID): Secondary | ICD-10-CM | POA: Insufficient documentation

## 2016-05-12 DIAGNOSIS — Z794 Long term (current) use of insulin: Secondary | ICD-10-CM | POA: Insufficient documentation

## 2016-05-12 DIAGNOSIS — J40 Bronchitis, not specified as acute or chronic: Secondary | ICD-10-CM | POA: Diagnosis not present

## 2016-05-12 DIAGNOSIS — R0602 Shortness of breath: Secondary | ICD-10-CM | POA: Diagnosis present

## 2016-05-12 DIAGNOSIS — J449 Chronic obstructive pulmonary disease, unspecified: Secondary | ICD-10-CM | POA: Diagnosis not present

## 2016-05-12 LAB — CBC WITH DIFFERENTIAL/PLATELET
BASOS PCT: 1 %
Basophils Absolute: 0.1 10*3/uL (ref 0–0.1)
EOS ABS: 0.1 10*3/uL (ref 0–0.7)
Eosinophils Relative: 1 %
HCT: 46 % (ref 35.0–47.0)
HEMOGLOBIN: 15.9 g/dL (ref 12.0–16.0)
Lymphocytes Relative: 36 %
Lymphs Abs: 2.2 10*3/uL (ref 1.0–3.6)
MCH: 31.2 pg (ref 26.0–34.0)
MCHC: 34.4 g/dL (ref 32.0–36.0)
MCV: 90.7 fL (ref 80.0–100.0)
MONO ABS: 0.5 10*3/uL (ref 0.2–0.9)
MONOS PCT: 8 %
NEUTROS PCT: 54 %
Neutro Abs: 3.3 10*3/uL (ref 1.4–6.5)
Platelets: 205 10*3/uL (ref 150–440)
RBC: 5.07 MIL/uL (ref 3.80–5.20)
RDW: 13.8 % (ref 11.5–14.5)
WBC: 6.2 10*3/uL (ref 3.6–11.0)

## 2016-05-12 LAB — BASIC METABOLIC PANEL
Anion gap: 12 (ref 5–15)
BUN: <5 mg/dL — ABNORMAL LOW (ref 6–20)
CALCIUM: 9 mg/dL (ref 8.9–10.3)
CO2: 23 mmol/L (ref 22–32)
CREATININE: 0.59 mg/dL (ref 0.44–1.00)
Chloride: 97 mmol/L — ABNORMAL LOW (ref 101–111)
GFR calc non Af Amer: >60 mL/min (ref >60)
Glucose, Bld: 219 mg/dL — ABNORMAL HIGH (ref 65–99)
Potassium: 3.7 mmol/L (ref 3.5–5.1)
SODIUM: 132 mmol/L — AB (ref 135–145)

## 2016-05-12 LAB — TROPONIN I

## 2016-05-12 LAB — BRAIN NATRIURETIC PEPTIDE: B NATRIURETIC PEPTIDE 5: 40 pg/mL (ref 0.0–100.0)

## 2016-05-12 MED ORDER — IPRATROPIUM-ALBUTEROL 0.5-2.5 (3) MG/3ML IN SOLN
3.0000 mL | Freq: Once | RESPIRATORY_TRACT | Status: AC
  Administered 2016-05-12: 3 mL via RESPIRATORY_TRACT
  Filled 2016-05-12: qty 3

## 2016-05-12 MED ORDER — ALBUTEROL SULFATE HFA 108 (90 BASE) MCG/ACT IN AERS: 2.0000 | INHALATION_SPRAY | Freq: Four times a day (QID) | RESPIRATORY_TRACT | 2 refills | Status: DC | PRN

## 2016-05-12 MED ORDER — SODIUM CHLORIDE 0.9 % IV SOLN
Freq: Once | INTRAVENOUS | Status: AC
  Administered 2016-05-12: 16:00:00 via INTRAVENOUS

## 2016-05-12 MED ORDER — HYDROCOD POLST-CPM POLST ER 10-8 MG/5ML PO SUER: 5.0000 mL | Freq: Two times a day (BID) | ORAL | 0 refills | Status: DC

## 2016-05-12 NOTE — ED Notes (Signed)
Pt back from CT

## 2016-05-12 NOTE — ED Notes (Signed)
Patient transported to X-ray 

## 2016-05-12 NOTE — ED Provider Notes (Signed)
[facility] Emergency Department Provider Note        Time seen: ----------------------------------------- 4:18 PM on 05/12/2016 -----------------------------------------    I have reviewed the triage vital signs and the nursing notes.   HISTORY  Chief Complaint Facial Pain and Cough    HPI [name] is a 40 y.o. female who presents to ER for sinus congestion and cough for the last week. Patient reports she is taking amoxicillin now she's not be better. She complains of pain across the lower part of her chest that is worse when she coughs. She is taking cough medicines without relief. She reports her blood sugars been elevated, she is a type I diabetic and has to correct her high blood sugars. She denies fevers, chills, vomiting or diarrhea. She also denies abdominal pain.   Past Medical History:  Diagnosis Date  . Anxiety state, unspecified 03/26/2009  . ASTHMA 02/02/2007  . DEPRESSION 02/16/2009  . DIABETES MELLITUS, TYPE I 02/02/2007  . Endometriosis   . GERD 12/21/2008  . Shortness of breath 09/25/2007    Patient Active Problem List   Diagnosis Date Noted  . Multinodular goiter 07/29/2015  . Type 1 diabetes mellitus with neurological manifestations, uncontrolled ([place]) 01/27/2014  . Restless leg 11/07/2013  . Current tobacco use 11/07/2013  . Triggering of digit 08/13/2013  . Hematuria 03/26/2012  . Chronic obstructive pulmonary disease ([place]) 02/15/2012  . CN (constipation) 01/01/2012  . H/O respiratory system disease 10/04/2011  . Cholesteatoma 09/11/2011  . Allergy to environmental factors 09/11/2011  . Anxiety 05/01/2011  . Myalgia 11/10/2010  . AIRWAY INFLAMMATION 06/08/2010  . Nonspecific (abnormal) findings on radiological and other examination of body structure 05/12/2010  . [place] LUNG FIELD 05/12/2010  . Type 1 diabetes mellitus ([place]) 04/01/2010  . Acid reflux 04/01/2010  . Absence of bladder  continence 04/01/2010  . VULVAR ABSCESS 03/18/2010  . UTI 03/09/2010  . INSOMNIA 02/18/2010  . FOLLICULITIS [id number]  . ANXIETY STATE, UNSPECIFIED 03/26/2009  . NAUSEA 02/19/2009  . DEPRESSION 02/16/2009  . GERD 12/21/2008  . ASTHMATIC BRONCHITIS, ACUTE 08/12/2008  . ACUTE MAXILLARY SINUSITIS 05/04/2008  . MOLE 01/29/2008  . Diarrhea 10/16/2007  . SMOKER 09/25/2007  . SHORTNESS OF BREATH 09/25/2007  . ASTHMA 02/02/2007    Past Surgical History:  Procedure Laterality Date  . ABDOMINAL HYSTERECTOMY  2009   BSO    Allergies Review of patient's allergies indicates no known allergies.  Social History Social History  Substance Use Topics  . Smoking status: Current Every Day Smoker    Packs/day: 1.00    Years: 20.00    Types: Cigarettes  . Smokeless tobacco: Never Used     Comment: form given 06/05/14  . Alcohol use No    Review of Systems Constitutional: Negative for fever. ENT: Positive for sinus congestion and pressure Cardiovascular: Negative for chest pain. Respiratory: Positive for shortness of breath and cough Gastrointestinal: Negative for abdominal pain, vomiting and diarrhea. Genitourinary: Negative for dysuria. Musculoskeletal: Negative for back pain. Skin: Negative for rash. Neurological: Negative for headaches, focal weakness or numbness.  10-point ROS otherwise negative.  ____________________________________________   PHYSICAL EXAM:  VITAL SIGNS: ED Triage Vitals  Enc Vitals Group     BP 05/12/16 1554 (!) 141/77     Pulse Rate 05/12/16 1554 95     Resp 05/12/16 1554 18     Temp 05/12/16 1554 98.2 F (36.8 C)     Temp Source 05/12/16 1554  Oral     SpO2 05/12/16 1554 99 %     Weight 05/12/16 1554 130 lb (59 kg)     Height 05/12/16 1554 5\' 5"  (1.651 m)     Head Circumference --      Peak Flow --      Pain Score 05/12/16 1555 8     Pain Loc --      Pain Edu? --      Excl. in [place]? --     Constitutional: Alert and oriented. Mild  distress Eyes: Conjunctivae are normal. PERRL. Normal extraocular movements. ENT   Head: Normocephalic and atraumatic.   Nose: Mild congestion   Mouth/Throat: Mucous membranes are moist.   Neck: No stridor. Cardiovascular: Normal rate, regular rhythm. No murmurs, rubs, or gallops. Respiratory: Normal respiratory effort without tachypnea nor retractions. Bilateral rhonchi Gastrointestinal: Soft and nontender. Normal bowel sounds Musculoskeletal: Nontender with normal range of motion in all extremities. No lower extremity tenderness nor edema. Neurologic:  Normal speech and language. No gross focal neurologic deficits are appreciated.  Skin:  Skin is warm, dry and intact. No rash noted. Psychiatric: Mood and affect are normal. Speech and behavior are normal.  ____________________________________________  ED COURSE:  Pertinent labs & imaging results that were available during my care of the patient were reviewed by me and considered in my medical decision making (see chart for details). Clinical Course  Patient presents with sinus congestion, cough. She'll receive a DuoNeb, we'll give IV fluids and check basic labs.  Procedures ____________________________________________   LABS (pertinent positives/negatives)  Labs Reviewed  BASIC METABOLIC PANEL - Abnormal; Notable for the following:       Result Value   Sodium 132 (*)    Chloride 97 (*)    Glucose, Bld 219 (*)    BUN <5 (*)    All other components within normal limits  CBC WITH DIFFERENTIAL/PLATELET  BRAIN NATRIURETIC PEPTIDE  TROPONIN I    RADIOLOGY  Chest x-ray  IMPRESSION: No active cardiopulmonary disease.  ____________________________________________  FINAL ASSESSMENT AND PLAN  URI  Plan: Patient with labs and imaging as dictated above. Patient presents with URI symptoms. She can continue taking her amoxicillin and prednisone. She'll be discharged with albuterol and Tussionex. She will be  encouraged to have close follow-up with her doctor as needed.   Earleen Newport, MD   Note: This dictation was prepared with Dragon dictation. Any transcriptional errors that result from this process are unintentional    Earleen Newport, MD 05/12/16 1718

## 2016-05-12 NOTE — ED Triage Notes (Signed)
Pt states she has sinus congestion, cough for 1 week.  Pt taking amoxil now and states i'm not any better.  cig smoker.  Pt taking cough meds without relief also.

## 2016-05-12 NOTE — ED Notes (Signed)
Patient states that she has had sinus infection since Monday and has been taking amoxicillin. Patient states that she is also taking prednisone which is making her blood sugar run in the 400's. Patient states that her chest is hurting and she is having sinus pressure.

## 2016-06-26 ENCOUNTER — Emergency Department (HOSPITAL_COMMUNITY)
Admission: EM | Admit: 2016-06-26 | Discharge: 2016-06-26 | Disposition: A | Discharge status: Home / Self Care | Payer: Medicaid Other | Attending: Emergency Medicine | Admitting: Emergency Medicine

## 2016-06-26 ENCOUNTER — Encounter (HOSPITAL_COMMUNITY): Payer: Self-pay | Admitting: Emergency Medicine

## 2016-06-26 DIAGNOSIS — E109 Type 1 diabetes mellitus without complications: Secondary | ICD-10-CM | POA: Diagnosis not present

## 2016-06-26 DIAGNOSIS — F17210 Nicotine dependence, cigarettes, uncomplicated: Secondary | ICD-10-CM | POA: Insufficient documentation

## 2016-06-26 DIAGNOSIS — G8929 Other chronic pain: Secondary | ICD-10-CM

## 2016-06-26 DIAGNOSIS — J449 Chronic obstructive pulmonary disease, unspecified: Secondary | ICD-10-CM | POA: Insufficient documentation

## 2016-06-26 DIAGNOSIS — M545 Low back pain: Secondary | ICD-10-CM | POA: Diagnosis not present

## 2016-06-26 DIAGNOSIS — M549 Dorsalgia, unspecified: Secondary | ICD-10-CM | POA: Diagnosis present

## 2016-06-26 LAB — URINALYSIS, ROUTINE W REFLEX MICROSCOPIC
BACTERIA UA: NONE SEEN
Bilirubin Urine: NEGATIVE
GLUCOSE, UA: 150 mg/dL — AB
Ketones, ur: NEGATIVE mg/dL
Leukocytes, UA: NEGATIVE
Nitrite: NEGATIVE
PROTEIN: NEGATIVE mg/dL
Specific Gravity, Urine: 1.004 — ABNORMAL LOW (ref 1.005–1.030)
pH: 5 (ref 5.0–8.0)

## 2016-06-26 MED ORDER — METHOCARBAMOL 500 MG PO TABS: 500.0000 mg | ORAL_TABLET | Freq: Two times a day (BID) | ORAL | 0 refills | Status: DC

## 2016-06-26 MED ORDER — CYCLOBENZAPRINE HCL 10 MG PO TABS
10.0000 mg | ORAL_TABLET | Freq: Once | ORAL | Status: DC
  Filled 2016-06-26: qty 1

## 2016-06-26 MED ORDER — KETOROLAC TROMETHAMINE 30 MG/ML IJ SOLN: 30.0000 mg | Freq: Once | INTRAMUSCULAR | Status: DC

## 2016-06-26 MED ORDER — HYDROMORPHONE HCL 2 MG/ML IJ SOLN
2.0000 mg | Freq: Once | INTRAMUSCULAR | Status: AC
  Administered 2016-06-26: 2 mg via INTRAMUSCULAR
  Filled 2016-06-26: qty 1

## 2016-06-26 MED ORDER — DIAZEPAM 5 MG PO TABS
5.0000 mg | ORAL_TABLET | Freq: Once | ORAL | Status: AC
  Administered 2016-06-26: 5 mg via ORAL
  Filled 2016-06-26: qty 1

## 2016-06-26 MED ORDER — NAPROXEN 500 MG PO TABS: 500.0000 mg | ORAL_TABLET | Freq: Two times a day (BID) | ORAL | 0 refills | Status: DC

## 2016-06-26 MED ORDER — KETOROLAC TROMETHAMINE 30 MG/ML IJ SOLN
30.0000 mg | Freq: Once | INTRAMUSCULAR | Status: DC
Start: 2016-06-26 — End: 2016-06-26
  Filled 2016-06-26: qty 1

## 2016-06-26 NOTE — ED Notes (Signed)
Patient understood instructions.  She is A & O x4.

## 2016-06-26 NOTE — ED Provider Notes (Signed)
I saw and evaluated the patient, reviewed the resident's note and I agree with the findings and plan.   EKG Interpretation None     Patient here complaining of worsening ongoing chronic lower back pain which became more severe last night. Not associated with bowel or bladder dysfunction. Pain sharp and worse with movement. Exam she is able to stand and walk. Will check urinalysis and medicated here.   Lacretia Leigh, MD 06/26/16 1106

## 2016-06-26 NOTE — Discharge Instructions (Signed)
Thank you for visiting ED today. Please take these medicines as directed if she needed for pain.Marland Kitchen Please contact your primary care for further management of this pain.

## 2016-06-26 NOTE — ED Provider Notes (Signed)
Emergency Department Provider Note   I have reviewed the triage vital signs and the nursing notes.   HISTORY  Chief Complaint Back Pain   HPI [name] is a 40 y.o. female with PMH significant for type 1 diabetes, asthma,, and GERD came to ED with complaint of worsening back pain. Patient states that she was getting this intermittent lower back pain for about a month , last night it got really worse without any precipitating factor, 10 over 10 in intensity, radiating to lower abdomen and groin area, along with both buttocks and back of legs. Any body movements make it worse no relieving factor. She denies any urinary symptoms, nausea, vomiting, diarrhea or constipation. She denies any tingling, numbness or focal weakness in her lower limb.   Past Medical History:  Diagnosis Date  . Anxiety state, unspecified 03/26/2009  . ASTHMA 02/02/2007  . DEPRESSION 02/16/2009  . DIABETES MELLITUS, TYPE I 02/02/2007  . Endometriosis   . GERD 12/21/2008  . Shortness of breath 09/25/2007    Patient Active Problem List   Diagnosis Date Noted  . Multinodular goiter 07/29/2015  . Type 1 diabetes mellitus with neurological manifestations, uncontrolled ([place]) 01/27/2014  . Restless leg 11/07/2013  . Current tobacco use 11/07/2013  . Triggering of digit 08/13/2013  . Hematuria 03/26/2012  . Chronic obstructive pulmonary disease ([place]) 02/15/2012  . CN (constipation) 01/01/2012  . H/O respiratory system disease 10/04/2011  . Cholesteatoma 09/11/2011  . Allergy to environmental factors 09/11/2011  . Anxiety 05/01/2011  . Myalgia 11/10/2010  . AIRWAY INFLAMMATION 06/08/2010  . Nonspecific (abnormal) findings on radiological and other examination of body structure 05/12/2010  . [place] LUNG FIELD 05/12/2010  . Type 1 diabetes mellitus ([place]) 04/01/2010  . Acid reflux 04/01/2010  . Absence of bladder continence 04/01/2010  . VULVAR ABSCESS 03/18/2010  . UTI  03/09/2010  . INSOMNIA 02/18/2010  . FOLLICULITIS [id number]  . ANXIETY STATE, UNSPECIFIED 03/26/2009  . NAUSEA 02/19/2009  . DEPRESSION 02/16/2009  . GERD 12/21/2008  . ASTHMATIC BRONCHITIS, ACUTE 08/12/2008  . ACUTE MAXILLARY SINUSITIS 05/04/2008  . MOLE 01/29/2008  . Diarrhea 10/16/2007  . SMOKER 09/25/2007  . SHORTNESS OF BREATH 09/25/2007  . ASTHMA 02/02/2007    Past Surgical History:  Procedure Laterality Date  . ABDOMINAL HYSTERECTOMY  2009   BSO    Current Outpatient Rx  . Order #: [id number] Class: Print  . Order #: [id number] Class: Print  . Order #: [id number] Class: Historical Med  . Order #: [id number] Class: Normal  . Order #: [id number] Class: Print  . Order #: [id number] Class: Print  . Order #: [id number] Class: Historical Med  . Order #: [id number] Class: Print  . Order #: [id number] Class: Historical Med  . Order #: [id number] Class: Normal  . Order #: [id number] Class: Normal  . Order #: [id number] Class: Historical Med  . Order #: [id number] Class: Historical Med  . Order #: [id number] Class: Normal  . Order #: [id number] Class: Historical Med  . Order #: [id number] Class: Historical Med  . Order #: [id number] Class: Print  . Order #: [id number] Class: Print  . Order #: [id number] Class: Historical Med  . Order #: [id number] Class: Normal  . Order #: [id number] Class: Print  . Order #: [id number] Class: Normal  . Order #: [id number] Class: Normal    Allergies Patient has no known allergies.  Family History  Problem Relation Age of Onset  . Diabetes Mother   . Diabetes Maternal Aunt   . Emphysema Maternal Aunt   . Emphysema Maternal Uncle   .  Diabetes Maternal Grandfather   . Lung cancer Maternal Aunt   . Colon cancer Neg Hx   . Kidney disease Neg Hx   . Liver disease Neg Hx     Social History Social History  Substance Use Topics  . Smoking status: Current Every Day Smoker    Packs/day: 1.00    Years: 20.00    Types: Cigarettes  . Smokeless tobacco: Never Used     Comment: form  given 06/05/14  . Alcohol use No    Review of Systems Constitutional: No fever/chills Eyes: No visual changes. ENT: No sore throat. Cardiovascular: Denies chest pain. Respiratory: Denies shortness of breath. Gastrointestinal: Lower abdominal pain.  No nausea, no vomiting.  No diarrhea.  No constipation. Genitourinary: Negative for dysuria. Musculoskeletal:  back pain. Skin: Negative for rash. Neurological: Negative for headaches, focal weakness or numbness. 10-point ROS otherwise negative.  ____________________________________________   PHYSICAL EXAM:  VITAL SIGNS: ED Triage Vitals  Enc Vitals Group     BP 06/26/16 0945 151/88     Pulse Rate 06/26/16 0945 100     Resp 06/26/16 0945 18     Temp 06/26/16 0945 97.6 F (36.4 C)     Temp src --      SpO2 06/26/16 0945 99 %     Weight 06/26/16 0949 130 lb (59 kg)     Height --      Head Circumference --      Peak Flow --      Pain Score --      Pain Loc --      Pain Edu? --      Excl. in [place]? --    Constitutional: Alert and oriented. Well appearing and in no acute distress. Eyes: Conjunctivae are normal. PERRL. EOMI. Head: Atraumatic. Neck: No stridor.  No meningeal signs. No cervical spine tenderness to palpation. Cardiovascular: Normal rate, regular rhythm. Good peripheral circulation. Grossly normal heart sounds.   Respiratory: Normal respiratory effort.  No retractions. Lungs CTAB. Gastrointestinal: Soft With mild lower abdominal tenderness. No distention.  Musculoskeletal: Tenderness along the lower thoracic and upper lumbar area , straight leg negative , sensations and strength normal and intact and both lower extremities. No lower extremity tenderness nor edema. No gross deformities of extremities. Neurologic:  Normal speech and language. No gross focal neurologic deficits are appreciated.  Skin:  Skin is warm, dry and intact. No rash noted. ____________________________________________   LABS (all labs ordered  are listed, but only abnormal results are displayed)  Labs Reviewed  URINALYSIS, ROUTINE W REFLEX MICROSCOPIC - Abnormal; Notable for the following:       Result Value   Color, Urine STRAW (*)    Specific Gravity, Urine 1.004 (*)    Glucose, UA 150 (*)    Hgb urine dipstick SMALL (*)    Squamous Epithelial / LPF 0-5 (*)    All other components within normal limits   ____________________________________________  EKG  None ____________________________________________  RADIOLOGY  No results found.  ____________________________________________   PROCEDURES  Procedure(s) performed:   Procedures   ____________________________________________   INITIAL IMPRESSION / ASSESSMENT AND PLAN / ED COURSE  Pertinent labs & imaging results that were available during my care of the patient were reviewed by me and considered in my medical decision making (see chart for details).  [name] is a 40 y.o. female with PMH significant for type 1 diabetes, asthma,, and GERD came to ED with complaint of worsening back pain. Patient states  that she was getting this intermittent lower back pain for about a month , last night it got really worse without any precipitating factor, 10 over 10 in intensity, radiating to lower abdomen and groin area, along with both buttocks and back of legs. Any body movements make it worse no relieving factor.  It looks looks like musculoskeletal pain, she is having chronic backache.   Her UA was normal.  She was given Dilaudid, valium and Toradol, which resolved her pain.  She was discharged home with naproxen and Robaxin. She needs to follow-up with her PCP regarding her chronic backache.   ____________________________________________  FINAL CLINICAL IMPRESSION(S) / ED DIAGNOSES  Final diagnoses:  Chronic bilateral low back pain, with sciatica presence unspecified     MEDICATIONS GIVEN DURING THIS VISIT:  Medications  HYDROmorphone  (DILAUDID) injection 2 mg (2 mg Intramuscular Given 06/26/16 1104)  diazepam (VALIUM) tablet 5 mg (5 mg Oral Given 06/26/16 1138)     NEW OUTPATIENT MEDICATIONS STARTED DURING THIS VISIT:  Discharge Medication List as of 06/26/2016 11:47 AM    START taking these medications   Details  methocarbamol (ROBAXIN) 500 MG tablet Take 1 tablet (500 mg total) by mouth 2 (two) times daily., Starting Mon 06/26/2016, Print    naproxen (NAPROSYN) 500 MG tablet Take 1 tablet (500 mg total) by mouth 2 (two) times daily., Starting Mon 06/26/2016, Print          Note:  This document was prepared using Dragon voice recognition software and may include unintentional dictation errors.     Lorella Nimrod, MD 06/26/16 1539

## 2016-06-26 NOTE — ED Triage Notes (Signed)
Pt reports lower back pain x 1 month, worsening last night . sts radiates to bilateral legs and lower abdomen. Denies urinary symptoms. No Hx kidney stones, Hx diabetes type I.

## 2016-10-10 ENCOUNTER — Ambulatory Visit (INDEPENDENT_AMBULATORY_CARE_PROVIDER_SITE_OTHER): Payer: Medicaid Other | Admitting: Endocrinology

## 2016-10-10 ENCOUNTER — Encounter: Payer: Medicaid Other | Admitting: Nutrition

## 2016-10-10 ENCOUNTER — Encounter: Payer: Self-pay | Admitting: Endocrinology

## 2016-10-10 VITALS — BP 132/80 | HR 96 | Ht 65.0 in | Wt 129.0 lb

## 2016-10-10 DIAGNOSIS — E1065 Type 1 diabetes mellitus with hyperglycemia: Secondary | ICD-10-CM

## 2016-10-10 DIAGNOSIS — E1040 Type 1 diabetes mellitus with diabetic neuropathy, unspecified: Secondary | ICD-10-CM

## 2016-10-10 DIAGNOSIS — IMO0002 Reserved for concepts with insufficient information to code with codable children: Secondary | ICD-10-CM

## 2016-10-10 LAB — POCT GLYCOSYLATED HEMOGLOBIN (HGB A1C): HEMOGLOBIN A1C: 8

## 2016-10-10 NOTE — Progress Notes (Signed)
Subjective:    Patient ID: [name], female    DOB: 1976-03-23, 41 y.o.   MRN: [id number]  HPI Pt returns for f/u of diabetes mellitus: DM type: 1 Dx'ed: 9563 Complications: polyneuropathy and retinopathy. Therapy: insulin since dx, pump rx since 2017 GDM: never DKA: never Severe hypoglycemia: never Pancreatitis: never Other: therapy has been limited by chronic noncompliance; she has had TAH; She has not yet started continuous glucose monitor basal rate of 0.4 units/hr, 24 hrs per day.  mealtime bolus of 1 unit/ 20 grams carbohydrate.  correction bolus (which some people call "sensitivity," or "insulin sensitivity ratio," or just "isr") of 1 unit for each 100 by which your glucose exceeds 100.   She seldom has hypoglycemia, and these episodes are mild.  no cbg record, but states cbg's are well-controlled.  There is no trend throughout the day, except it is lowest with activity.  She averages a total of approx 18 units/day.  Past Medical History:  Diagnosis Date  . Anxiety state, unspecified 03/26/2009  . ASTHMA 02/02/2007  . DEPRESSION 02/16/2009  . DIABETES MELLITUS, TYPE I 02/02/2007  . Endometriosis   . GERD 12/21/2008  . Shortness of breath 09/25/2007    Past Surgical History:  Procedure Laterality Date  . ABDOMINAL HYSTERECTOMY  2009   BSO    Social History   Social History  . Marital status: Legally Separated    Spouse name: N/A  . Number of children: 2  . Years of education: N/A   Occupational History  .  Unemployed   Social History Main Topics  . Smoking status: Current Every Day Smoker    Packs/day: 1.00    Years: 20.00    Types: Cigarettes  . Smokeless tobacco: Never Used     Comment: form given 06/05/14  . Alcohol use No  . Drug use: No  . Sexual activity: Not on file   Other Topics Concern  . Not on file   Social History Narrative   2 children--pt has custody. Husband pays child support.   Daily Caffeine Use-3 2 liters a day   Pt does  not get regular exercise.    Current Outpatient Prescriptions on File Prior to Visit  Medication Sig Dispense Refill  . albuterol (PROVENTIL HFA;VENTOLIN HFA) 108 (90 Base) MCG/ACT inhaler Inhale 2 puffs into the lungs every 4 (four) hours as needed for wheezing or shortness of breath. 1 Inhaler 0  . albuterol (PROVENTIL HFA;VENTOLIN HFA) 108 (90 Base) MCG/ACT inhaler Inhale 2 puffs into the lungs every 6 (six) hours as needed for wheezing or shortness of breath. 1 Inhaler 2  . atorvastatin (LIPITOR) 10 MG tablet Take 10 mg by mouth at bedtime.    . Blood Glucose Monitoring Suppl (ACCU-CHEK AVIVA PLUS) W/DEVICE KIT 1 Device by Does not apply route once. 1 kit 0  . clonazePAM (KLONOPIN) 1 MG tablet Take 1 mg by mouth 2 (two) times daily as needed for anxiety.    . gabapentin (NEURONTIN) 300 MG capsule 1 po QHS; may increase to 2 po QHS after 1 week if night sweats persist    . glucagon 1 MG injection Inject 1 mg into the muscle once as needed. 1 each 12  . glucose blood (ACCU-CHEK AVIVA PLUS) test strip USE TO TEST BLOOD SUGAR 7 TIMES DAILY, and lancets 7/day. DX E10.41 250 each 11  . HYDROcodone-acetaminophen (NORCO/VICODIN) 5-325 MG tablet Take 1 tablet by mouth every 6 (six) hours as needed for moderate pain.    Marland Kitchen  ibuprofen (ADVIL,MOTRIN) 800 MG tablet Take 800 mg by mouth.    . insulin aspart (NOVOLOG) 100 UNIT/ML injection 40 units daily per insulin pump (max dosage) 20 mL 11  . Insulin Pen Needle 31G X 8 MM MISC Use as directed     . lactobacillus acidophilus (BACID) TABS tablet Take 2 tablets by mouth 3 (three) times daily.    . methocarbamol (ROBAXIN) 500 MG tablet Take 1 tablet (500 mg total) by mouth 2 (two) times daily. 20 tablet 0  . omeprazole (PRILOSEC) 20 MG capsule Take 20 mg by mouth 2 (two) times daily.    Marland Kitchen PARoxetine (PAXIL) 40 MG tablet Take 1 tablet (40 mg total) by mouth daily. 30 tablet 0  . rifaximin (XIFAXAN) 550 MG TABS tablet Take 1 tablet (550 mg total) by mouth 3  (three) times daily. 42 tablet 0  . rOPINIRole (REQUIP) 0.5 MG tablet TAKE THREE TABLETS BY MOUTH AT BEDTIME 90 tablet 1   No current facility-administered medications on file prior to visit.     No Known Allergies  Family History  Problem Relation Age of Onset  . Diabetes Mother   . Diabetes Maternal Aunt   . Emphysema Maternal Aunt   . Emphysema Maternal Uncle   . Diabetes Maternal Grandfather   . Lung cancer Maternal Aunt   . Colon cancer Neg Hx   . Kidney disease Neg Hx   . Liver disease Neg Hx     BP 132/80   Pulse 96   Ht '5\' 5"'  (1.651 m)   Wt 129 lb (58.5 kg)   SpO2 97%   BMI 21.47 kg/m    Review of Systems Denies LOC    Objective:   Physical Exam VITAL SIGNS:  See vs page GENERAL: no distress SKIN:  Insulin injection sites at the anterior abdomen are normal Pulses: dorsalis pedis intact bilat.   MSK: no deformity of the feet.  CV: no leg edema Skin:  no ulcer on the feet.  normal color and temp on the feet. Neuro: sensation is intact to touch on the feet.    a1c=8.0%  Lab Results  Component Value Date   CREATININE 0.59 05/12/2016   BUN <5 (L) 05/12/2016   NA 132 (L) 05/12/2016   K 3.7 05/12/2016   CL 97 (L) 05/12/2016   CO2 23 05/12/2016      Assessment & Plan:  Type 1 DM, with retinopathy: she needs increased rx   Patient is advised the following: Patient Instructions  Please continue these settings: basal rate of 0.4 units/hr, 24 hrs per day.  Suspend for 1-2 hrs for activity.   mealtime bolus of 1 unit/ 18 grams carbohydrate.  correction bolus (which some people call "sensitivity," or "insulin sensitivity ratio," or just "isr") of 1 unit for each 100 by which your glucose exceeds 100.   Please come back for a follow-up appointment in 2 months.

## 2016-10-10 NOTE — Patient Instructions (Addendum)
Please continue these settings: basal rate of 0.4 units/hr, 24 hrs per day.  Suspend for 1-2 hrs for activity.   mealtime bolus of 1 unit/ 18 grams carbohydrate.  correction bolus (which some people call "sensitivity," or "insulin sensitivity ratio," or just "isr") of 1 unit for each 100 by which your glucose exceeds 100.   Please come back for a follow-up appointment in 2 months.

## 2016-10-12 ENCOUNTER — Telehealth: Payer: Self-pay | Admitting: Endocrinology

## 2016-10-12 NOTE — Telephone Encounter (Signed)
please call patient: I worked on the [employer] form.  Given the meds you are on, I can't do the entire form, so I did the diabetes part.  The first page says they as asking just because of diabetes and vision.  However, the next page asks about all medical conditions.

## 2016-10-12 NOTE — Telephone Encounter (Signed)
I contacted the patient and advised of message. She voiced understanding and stated she would come and pick the forms up to take to his PCP.

## 2016-10-19 LAB — HM DIABETES EYE EXAM

## 2016-10-25 ENCOUNTER — Other Ambulatory Visit: Payer: Self-pay | Admitting: Gastroenterology

## 2016-10-25 NOTE — Telephone Encounter (Signed)
Yes we can refill it for her if it worked the last time for her symptoms. She can follow up with me if symptoms persist. Thanks

## 2016-10-25 NOTE — Telephone Encounter (Signed)
Pt is requesting Xifaxan. Does she need an office visit or are you ok to refill?

## 2016-10-26 ENCOUNTER — Other Ambulatory Visit: Payer: Self-pay

## 2016-10-26 NOTE — Telephone Encounter (Signed)
Patient states she needs a refill of zifaxan called into [facility] on [address].

## 2016-10-30 ENCOUNTER — Telehealth: Payer: Self-pay | Admitting: Gastroenterology

## 2016-10-30 NOTE — Telephone Encounter (Signed)
This pt has [place] MCD. Xifaxan is not a preferred medication We do have some samples in the office.  How long do you want her to take the medication? We have some samples I can give her.

## 2016-10-30 NOTE — Telephone Encounter (Signed)
She had called requesting another course of it, it worked previously for her symptoms. Ideally would like a 2 week course of 550mg  TID, however if we have enough samples to cover one week we can try that and see how she does. Thanks

## 2016-10-31 NOTE — Telephone Encounter (Signed)
Great - thanks

## 2016-10-31 NOTE — Telephone Encounter (Signed)
We had enough samples to give the pt two weeks worth of Xifaxan.  Pt informed and samples left up front for her to pick up.

## 2016-11-02 ENCOUNTER — Encounter: Payer: Self-pay | Admitting: Internal Medicine

## 2016-12-27 ENCOUNTER — Telehealth: Payer: Self-pay | Admitting: Gastroenterology

## 2016-12-27 NOTE — Telephone Encounter (Signed)
Patient was at her PCP visit and has a Vit. D deficiency, she is wondering if she should continue taking the Xifaxan along with the Vit.D supplement she will be starting? She states the Xifaxan is helping.

## 2016-12-28 NOTE — Telephone Encounter (Signed)
Yes it is fine to take the vitamin D supplement while taking Rifaximin. Thanks

## 2016-12-28 NOTE — Telephone Encounter (Signed)
Left message for patient that she can take Vit.D supplement and Rifaximin.

## 2017-01-01 ENCOUNTER — Other Ambulatory Visit: Payer: Self-pay

## 2017-01-01 ENCOUNTER — Telehealth: Payer: Self-pay | Admitting: Gastroenterology

## 2017-01-01 MED ORDER — RIFAXIMIN 550 MG PO TABS: 550.0000 mg | ORAL_TABLET | Freq: Three times a day (TID) | ORAL | 0 refills | Status: DC

## 2017-01-01 NOTE — Telephone Encounter (Signed)
Patient called back today, she had received samples of Xifaxan. Her primary care doctor put her on amoxicillin and had advised her to stop taking the Xifaxan. She just started back with it (Xifaxan) last Monday, 6/11. She states she is still having stomach cramps and diarrhea. Patient originally called to get two more days worth of samples. I guess patient's insurance denied the Rx for this medication, not sure if Caryl Pina did a prior auth or not, I will check with Caryl Pina.

## 2017-01-01 NOTE — Telephone Encounter (Signed)
Patient is still having diarrhea. We do have samples to give to her, she was made an appointment to follow up with Dr. Havery Moros for tomorrow 6/19. Will give the samples to [facility] to give to patient tomorrow, if this is what Dr. Havery Moros would like her to continue taking.

## 2017-01-01 NOTE — Telephone Encounter (Signed)
Okay, see if [name] has any more information. Otherwise we can see if she can get 2 days worth of samples if needed. If symptoms persist at that point I should see her in clinic for reassessment given it has been over a year. Thanks

## 2017-01-02 ENCOUNTER — Ambulatory Visit: Payer: Medicaid Other | Admitting: Gastroenterology

## 2017-01-02 ENCOUNTER — Telehealth: Payer: Self-pay | Admitting: Gastroenterology

## 2017-01-05 ENCOUNTER — Telehealth: Payer: Self-pay | Admitting: Gastroenterology

## 2017-01-05 NOTE — Telephone Encounter (Signed)
Notes faxed to [facility]. 

## 2017-01-11 NOTE — Telephone Encounter (Signed)
We can give her a sample if we have enough to do that. I had requested a clinic visit for her to see me and she cancelled it. Please reschedule her if she is amenable. Otherwise, we can try bentyl 10mg  q 8 hours PRN, although this is for cramps / abdominal pain, will not help her diarrhea if that is the main issue. Thanks

## 2017-01-11 NOTE — Telephone Encounter (Signed)
Routed to [place].

## 2017-01-11 NOTE — Telephone Encounter (Signed)
Dr. Havery Moros,  Are you ok with giving pt samples? There are a few in the cabinets.  Looks like pt will need to do a trial of Bentyl before ins will cover.

## 2017-01-11 NOTE — Telephone Encounter (Signed)
Pharmacy called to state xifaxan was denied due to patient needing to try bentyl or other medication for IBS first. Order [facility] at [facility]

## 2017-01-12 ENCOUNTER — Other Ambulatory Visit: Payer: Self-pay

## 2017-01-12 MED ORDER — DICYCLOMINE HCL 10 MG PO CAPS: 10.0000 mg | ORAL_CAPSULE | Freq: Three times a day (TID) | ORAL | 0 refills | Status: DC | PRN

## 2017-01-12 NOTE — Telephone Encounter (Signed)
8 days worth of Xifaxan samples left up front for pt. A trial of Bentyl was sent to pts pharmacy for her to try. Pt will call back with an update and then we will contact [facility] for a Xifaxan approval. Pt understood.

## 2017-01-22 ENCOUNTER — Other Ambulatory Visit: Payer: Self-pay | Admitting: Endocrinology

## 2017-02-01 ENCOUNTER — Telehealth: Payer: Self-pay | Admitting: Gastroenterology

## 2017-02-02 ENCOUNTER — Other Ambulatory Visit: Payer: Self-pay

## 2017-02-02 DIAGNOSIS — R197 Diarrhea, unspecified: Secondary | ICD-10-CM

## 2017-02-02 NOTE — Telephone Encounter (Signed)
Why don't we send stool for fecal elastase given her description of stools. She can also use immodium as needed prior to meals to see if this helps. Thanks

## 2017-02-02 NOTE — Telephone Encounter (Signed)
Left message I was returning her call. 

## 2017-02-02 NOTE — Telephone Encounter (Signed)
Patient states that she is taking the Xifaxan 550 mg TID, still very nauseated and has diarrhea about 2 hours after eating. Stool is very loose, greasy. She does have an appointment on 7/31 but wanted to know if there was something else she could try or do in the mean time. Please advise. Thank you.

## 2017-02-02 NOTE — Telephone Encounter (Signed)
Patient advised to come to lab to give stool sample, she will try taking imodium prior to meals to see that helps. Order in [place].

## 2017-02-07 ENCOUNTER — Other Ambulatory Visit: Payer: Medicaid Other

## 2017-02-07 DIAGNOSIS — R197 Diarrhea, unspecified: Secondary | ICD-10-CM

## 2017-02-13 ENCOUNTER — Ambulatory Visit: Payer: Medicaid Other | Admitting: Gastroenterology

## 2017-02-15 ENCOUNTER — Telehealth: Payer: Self-pay | Admitting: Gastroenterology

## 2017-02-15 LAB — PANCREATIC ELASTASE, FECAL: Pancreatic Elastase-1, Stool: >500 mcg/g

## 2017-02-15 NOTE — Telephone Encounter (Signed)
Patient wanted to know about her lab results, let her know they are still in progress and when they are in and Dr. Havery Moros has reviewed them we will let her know. Patient also states that she missed her appointment on Monday due to emergency with her son. I have rescheduled her for the 3rd time to September.

## 2017-02-20 ENCOUNTER — Other Ambulatory Visit: Payer: Self-pay

## 2017-02-20 MED ORDER — COLESTIPOL HCL 1 G PO TABS: 1.0000 g | ORAL_TABLET | Freq: Two times a day (BID) | ORAL | 1 refills | Status: DC

## 2017-03-02 ENCOUNTER — Telehealth: Payer: Self-pay | Admitting: Gastroenterology

## 2017-03-02 ENCOUNTER — Other Ambulatory Visit: Payer: Self-pay

## 2017-03-02 MED ORDER — RIFAXIMIN 550 MG PO TABS: 550.0000 mg | ORAL_TABLET | Freq: Three times a day (TID) | ORAL | 0 refills | Status: DC

## 2017-03-02 NOTE — Telephone Encounter (Signed)
Rx sent to [place], faxed to [phone].

## 2017-03-02 NOTE — Telephone Encounter (Signed)
Patient has her rescheduled appointment on 03/28/17. Please advise.

## 2017-03-02 NOTE — Telephone Encounter (Signed)
Called patient, had to lvm. Asked that she call back about her prescription. I also need to know which pharmacy she would like it sent to.

## 2017-03-02 NOTE — Telephone Encounter (Signed)
Sorry to hear the colestid didn't work. We can try Rifaximin 550mg  TID for 2 weeks, but it may be expensive. If her insurance will cover it okay then we can order that for her and see her in clinic for follow up. Thanks

## 2017-03-05 ENCOUNTER — Other Ambulatory Visit: Payer: Self-pay

## 2017-03-05 MED ORDER — RIFAXIMIN 550 MG PO TABS: 550.0000 mg | ORAL_TABLET | Freq: Three times a day (TID) | ORAL | 0 refills | Status: DC

## 2017-03-07 ENCOUNTER — Telehealth: Payer: Self-pay | Admitting: Gastroenterology

## 2017-03-08 NOTE — Telephone Encounter (Signed)
Notes faxed to [facility]. Attn Gae Bon

## 2017-03-21 ENCOUNTER — Telehealth: Payer: Self-pay | Admitting: Gastroenterology

## 2017-03-21 NOTE — Telephone Encounter (Signed)
Jan, I faxed [facility] pts OV notes on 03/07/17. Have you seen a response from them?

## 2017-03-21 NOTE — Telephone Encounter (Signed)
I contacted [facility] to find out the status of patient's xifaxan since it appears rx was sent to them first. [facility] rep states that [facility] but that the prior auth was actually denied anyway because patient "has not tried and failed bentyl and antidepressant." Upon chart review, patient HAS been on Bentyl and has taken paxil (was having diarrhea at time she was taking paxil as well). Upon attempting to appeal this denial, I was unable to do so.  I have advised patient of the information above and advised that we can get samples of Xifaxan at the time of her appointment with Dr Havery Moros on 03/28/17 if he feels appropriate. She states that she is having terrible gas and abdominal discomfort. I advised she take gas-x to help with the gas. She states that she is taking imodium. When asked how many bowel movements she is having without imodium, she tells me she is having 1 bm to a max of 4 bm's daily. I advised to hold off on imodium for now unless she starts having more than 4 bm's daily (as I wonder if she is not getting constipated at this point). I have also scheduled her to see Tye Savoy, NP on 03/23/17 at 1:30 pm as patient does not feel she can wait until 03/28/17 to be seen.

## 2017-03-23 ENCOUNTER — Ambulatory Visit (INDEPENDENT_AMBULATORY_CARE_PROVIDER_SITE_OTHER): Payer: Medicaid Other | Admitting: Nurse Practitioner

## 2017-03-23 ENCOUNTER — Encounter: Payer: Self-pay | Admitting: Nurse Practitioner

## 2017-03-23 VITALS — BP 132/66 | HR 88 | Ht 65.75 in | Wt 136.4 lb

## 2017-03-23 DIAGNOSIS — R197 Diarrhea, unspecified: Secondary | ICD-10-CM | POA: Diagnosis not present

## 2017-03-23 DIAGNOSIS — K909 Intestinal malabsorption, unspecified: Secondary | ICD-10-CM

## 2017-03-23 MED ORDER — PANCRELIPASE (LIP-PROT-AMYL) 36000-114000 UNITS PO CPEP: ORAL_CAPSULE | ORAL | 0 refills | Status: DC

## 2017-03-23 NOTE — Patient Instructions (Addendum)
If you are age 41 or older, your body mass index should be between 23-30. Your Body mass index is 22.18 kg/m. If this is out of the aforementioned range listed, please consider follow up with your Primary Care Provider.  If you are age 40 or younger, your body mass index should be between 19-25. Your Body mass index is 22.18 kg/m. If this is out of the aformentioned range listed, please consider follow up with your Primary Care Provider.     We have given you samples of the following medication to take: Creon, 36,000usp  Take 2 tablets with each meal for 6 days.  Call us and let us know if your symptoms resolve.  You have been scheduled for a follow up appointment with Dr. Havery Moros on 05-24-17 at 1:30pm.   Thank you.

## 2017-03-23 NOTE — Progress Notes (Signed)
     HPI: Patient is a 41 year old female with Type I diabetes. She is followed by Dr. Havery Moros for chronic diarrhea. Prior workup including EGD, colonoscopy, and celiac testing have been unremarkable. Fecal fat positive for fecal elastase negative. She has been treated empirically for SIBO. Stools are non-bloody, sometimes watery other times greasy. .  She has anywhere from 1-4 BMs a day. She has malodorous gas, belching and bloating. Symptoms occur regardless of what she eats. Marland Kitchen Her weight is stable. Patient tells me that she has at least 3 vitamin deficiencies requiring supplements. She has tried Imodium but it caused caused abdominal pain. Low [place] diet wasn't helpful  Past Medical History:  Diagnosis Date  . Anxiety state, unspecified 03/26/2009  . ASTHMA 02/02/2007  . DEPRESSION 02/16/2009  . DIABETES MELLITUS, TYPE I 02/02/2007  . Endometriosis   . GERD 12/21/2008  . Shortness of breath 09/25/2007    Patient's surgical history, family medical history, social history, medications and allergies were all reviewed in Epic    Physical Exam: BP 132/66 (BP Location: Left Arm, Patient Position: Sitting, Cuff Size: Normal)   Pulse 88   Ht 5' 5.75" (1.67 m) Comment: height measured without shoes  Wt 136 lb 6 oz (61.9 kg)   BMI 22.18 kg/m   GENERAL: well developed white female in NAD PSYCH: :Pleasant, cooperative, normal affect EENT:  conjunctiva pink, mucous membranes moist, neck supple without masses CARDIAC:  RRR, no murmur heard, noperipheral edema PULM: Normal respiratory effort, lungs CTA bilaterally, no wheezing ABDOMEN:  soft, nontender, nondistended, no obvious masses, no hepatomegaly,  normal bowel sounds SKIN:  turgor, no lesions seen Musculoskeletal:  Normal muscle tone, normal strength NEURO: Alert and oriented x 3, no focal neurologic deficits   ASSESSMENT and PLAN:  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  -The last course of Xifaxan didn't work as well  though she was a couple of days shy of completing the full course because of lack of sample.   -Before retrying Xifaxan I would like to have her try Creon,  samples given.  Though fecal elastase negative her stool was positive for fat and she has longstanding type I  DM making pancreatic insuffiencey seem likely. Samples of creon given, she will call late next week with update, if better we can call enzymes to pharmacy.  -follow up with Dr. Havery Moros in 2-3 months, or sooner if need be   Tye Savoy , NP 03/23/2017, 1:49 PM

## 2017-03-26 ENCOUNTER — Telehealth: Payer: Self-pay | Admitting: Nurse Practitioner

## 2017-03-27 NOTE — Telephone Encounter (Signed)
Okay, it was worth a try. If no improvement at all then can stop the creon. Please give her another course of xifaxan for IBS. Thanks

## 2017-03-27 NOTE — Telephone Encounter (Signed)
Patient is instructed. She will call us if she acutely worsens or fails to improve.

## 2017-03-27 NOTE — Progress Notes (Signed)
Agree with assessment and plan as outlined.  

## 2017-03-27 NOTE — Telephone Encounter (Signed)
Patient states "exact same thing" when asked what her symptoms are. Specifically she complains of smelly gas, greasy loose stools from 1 to 4 each day and a bloated feeling. She has been taking Creon 2 at each meal. States she feels Creon may have made her stomach hurt a little. She now has the Xifaxan. Asks if she can change to Xifaxan and stop Creon.

## 2017-03-28 ENCOUNTER — Ambulatory Visit: Payer: Medicaid Other | Admitting: Gastroenterology

## 2017-04-11 ENCOUNTER — Telehealth: Payer: Self-pay | Admitting: Gastroenterology

## 2017-04-11 NOTE — Telephone Encounter (Signed)
Pt is calling about additional Xifaxan samples.  Would you please clarify how long you intended for patient to be on Xifaxan? If intended for long term use, I assume we just need to have her fill her prescription.  We don't have samples currently and don't regularly receive enough samples to continue to supply her.  Thank you.

## 2017-04-12 NOTE — Telephone Encounter (Signed)
Called pt and let her know we do not have samples of Xifaxan.  She is unclear what she should do at this point because she is not getting substantially better and was wondering if she should just keep taking Xifaxan Since she still has diarrhea. Has appt with Dr. Havery Moros in November but wants some direction about what else she might try before then. Should she schedule with an APP or is there something else she can try? I told her I would ask a nurse to call her back and help her decide what course of action would be most appropriate.

## 2017-04-13 NOTE — Telephone Encounter (Signed)
Spoke with the patient and explained the plan. In discussing this with her she comments she had taken Creon for only a day and a half. She asks if she gave it enough time to have an improvement in her symptoms. Then she describes her "sulfur smelling gas, greasy stools and loose stools." When asked about the abdominal "pain" she had, she states, "but I was already having pain." She would like to try the Creon again.

## 2017-04-13 NOTE — Telephone Encounter (Signed)
[name] this is the patient who felt the Creon made her have worsening stomach pain. She did not like Colestid either. She has abnormal stools. Please advise.

## 2017-04-13 NOTE — Telephone Encounter (Signed)
Sorry to hear she is not feeling better. She previously responded well to Rifaxmin, but it appears it has not helped recently. Rifaximin is not a chronic medication, only use it for 2 weeks at a time.  If Colestid did not help she should stop it If Creon has not helped we should stop it If her symptoms of diarrhea are persisting despite a trial of multiple medications, we may want to pursue a colonoscopy. She did not have biopsies taken from her colon to rule out microscopic colitis when it was done 8 years ago. Otherwise, it is possible she has IBS. Does she want to see me in clinic to discuss options or proceed with a colonoscopy? She can try IB gard in the interim, if we have any samples to give her in the interim.

## 2017-04-16 NOTE — Telephone Encounter (Signed)
[name], yes I agree she needs to give Creon an adequate chance. Thanks

## 2017-05-24 ENCOUNTER — Ambulatory Visit: Payer: Medicaid Other | Admitting: Gastroenterology

## 2017-06-19 ENCOUNTER — Encounter: Payer: Self-pay | Admitting: Gastroenterology

## 2017-06-19 ENCOUNTER — Other Ambulatory Visit (INDEPENDENT_AMBULATORY_CARE_PROVIDER_SITE_OTHER): Payer: Medicaid Other

## 2017-06-19 ENCOUNTER — Ambulatory Visit (INDEPENDENT_AMBULATORY_CARE_PROVIDER_SITE_OTHER): Payer: Medicaid Other | Admitting: Gastroenterology

## 2017-06-19 VITALS — BP 130/64 | HR 88 | Ht 65.75 in | Wt 135.5 lb

## 2017-06-19 DIAGNOSIS — K529 Noninfective gastroenteritis and colitis, unspecified: Secondary | ICD-10-CM

## 2017-06-19 DIAGNOSIS — R112 Nausea with vomiting, unspecified: Secondary | ICD-10-CM

## 2017-06-19 DIAGNOSIS — R140 Abdominal distension (gaseous): Secondary | ICD-10-CM | POA: Diagnosis not present

## 2017-06-19 DIAGNOSIS — R110 Nausea: Secondary | ICD-10-CM | POA: Diagnosis not present

## 2017-06-19 LAB — CBC WITH DIFFERENTIAL/PLATELET
BASOS PCT: 0.8 % (ref 0.0–3.0)
Basophils Absolute: 0.1 10*3/uL (ref 0.0–0.1)
EOS PCT: 0.6 % (ref 0.0–5.0)
Eosinophils Absolute: 0 10*3/uL (ref 0.0–0.7)
HCT: 40.9 % (ref 36.0–46.0)
HEMOGLOBIN: 13.8 g/dL (ref 12.0–15.0)
LYMPHS ABS: 2 10*3/uL (ref 0.7–4.0)
Lymphocytes Relative: 28.4 % (ref 12.0–46.0)
MCHC: 33.7 g/dL (ref 30.0–36.0)
MCV: 92.7 fl (ref 78.0–100.0)
MONOS PCT: 8 % (ref 3.0–12.0)
Monocytes Absolute: 0.6 10*3/uL (ref 0.1–1.0)
Neutro Abs: 4.4 10*3/uL (ref 1.4–7.7)
Neutrophils Relative %: 62.2 % (ref 43.0–77.0)
Platelets: 175 10*3/uL (ref 150.0–400.0)
RBC: 4.41 Mil/uL (ref 3.87–5.11)
RDW: 12.9 % (ref 11.5–15.5)
WBC: 7.2 10*3/uL (ref 4.0–10.5)

## 2017-06-19 LAB — COMPREHENSIVE METABOLIC PANEL
ALK PHOS: 69 U/L (ref 39–117)
ALT: 13 U/L (ref 0–35)
AST: 13 U/L (ref 0–37)
Albumin: 4.5 g/dL (ref 3.5–5.2)
BUN: 8 mg/dL (ref 6–23)
CHLORIDE: 94 meq/L — AB (ref 96–112)
CO2: 26 mEq/L (ref 19–32)
Calcium: 9.2 mg/dL (ref 8.4–10.5)
Creatinine, Ser: 0.57 mg/dL (ref 0.40–1.20)
GFR: 124.12 mL/min (ref >60.00)
GLUCOSE: 239 mg/dL — AB (ref 70–99)
POTASSIUM: 3.9 meq/L (ref 3.5–5.1)
SODIUM: 127 meq/L — AB (ref 135–145)
TOTAL PROTEIN: 6.9 g/dL (ref 6.0–8.3)
Total Bilirubin: 0.3 mg/dL (ref 0.2–1.2)

## 2017-06-19 LAB — TSH: TSH: 0.6 u[IU]/mL (ref 0.35–4.50)

## 2017-06-19 MED ORDER — ONDANSETRON HCL 4 MG PO TABS: 4.0000 mg | ORAL_TABLET | Freq: Three times a day (TID) | ORAL | 1 refills | Status: DC | PRN

## 2017-06-19 NOTE — Patient Instructions (Addendum)
If you are age 41 or older, your body mass index should be between 23-30. Your Body mass index is 22.04 kg/m. If this is out of the aforementioned range listed, please consider follow up with your Primary Care Provider.  If you are age 55 or younger, your body mass index should be between 19-25. Your Body mass index is 22.04 kg/m. If this is out of the aformentioned range listed, please consider follow up with your Primary Care Provider.   Your physician has requested that you go to the basement for the following lab work before leaving today: Cbc, cmet and tsh  Please discontinue taking Creon and Probiotics.  We have sent the following medications to your pharmacy for you to pick up at your convenience: Zofran  Please purchase the following medications over the counter and take as directed: Beano IBgard - please take 1 to 2 tablets prior to meals.  We have provided you with a coupon.  You will be contacted by [facility] to have a Small Intestinal Bacterial Overgrowth test (SIBO).    Thank you for entrusting me with your care, Dr. Fulton Cellar

## 2017-06-19 NOTE — Progress Notes (Signed)
HPI :  41 year old female here for reassessment. She has a history of insulin-dependent diabetes on insulin pump complicated by neuropathy. She's been followed with [place] for intermittent diarrhea and abdominal bloating over the years. She had a colonoscopy and EGD in 2009 for this issue, both were normal. She has tested negative for celiac disease with serologies although no Iga level sent.  She has tested positive for fecal fat historically however pancreatic elastase has been normal. She has been treated empirically with rifaximin in the past for suspected SIBO and has responded favorably. Unfortunately the last time she was given rifaximin it did not help her so much. She's had no benefit from a low FODMAP diet. She try Gas-X which does not help. She has tried probiotics which have not helped. She was given empiric trial of Creon which did not help, nor did a trial of Colestid.  Her main complaint is abdominal gas and distention. This occurs intermittently associated with lower abdominal cramping. She has loose stools anywhere from 2-4 times per day. No blood in the stools. She states her gas smells very foul. She doesn't endorse any upper abdominal pain. She feels like certain foods make her symptoms reliably worse. She denies any NSAID use. LAst A1c around 8s.  Prior workup: Colonoscopy 4/09 - normal EGD 5/09 - esophagitis, normal duodenum CT abdomen - 01/2014 - normal, cholelithiasis CT abdomen 04/2011 - normal, cholelithiasis Gastric emptying study 10/2007 - normal  Past Medical History:  Diagnosis Date  . Anxiety state, unspecified 03/26/2009  . ASTHMA 02/02/2007  . DEPRESSION 02/16/2009  . DIABETES MELLITUS, TYPE I 02/02/2007  . Endometriosis   . GERD 12/21/2008  . Lesion of vocal cord   . Shortness of breath 09/25/2007  . Vocal fold leukoplakia      Past Surgical History:  Procedure Laterality Date  . ABDOMINAL HYSTERECTOMY  2009   BSO   Family History  Problem Relation Age of  Onset  . Diabetes Mother   . Diabetes Maternal Aunt   . Emphysema Maternal Aunt   . Emphysema Maternal Uncle   . Diabetes Maternal Grandfather   . Lung cancer Maternal Aunt   . Colon cancer Neg Hx   . Kidney disease Neg Hx   . Liver disease Neg Hx    Social History   Tobacco Use  . Smoking status: Current Every Day Smoker    Packs/day: 1.00    Years: 20.00    Pack years: 20.00    Types: Cigarettes  . Smokeless tobacco: Never Used  . Tobacco comment: form given 06/05/14  Substance Use Topics  . Alcohol use: No    Alcohol/week: 0.0 oz  . Drug use: No   Current Outpatient Medications  Medication Sig Dispense Refill  . albuterol (PROVENTIL HFA;VENTOLIN HFA) 108 (90 Base) MCG/ACT inhaler Inhale 2 puffs into the lungs every 6 (six) hours as needed for wheezing or shortness of breath. 1 Inhaler 2  . atorvastatin (LIPITOR) 10 MG tablet Take 10 mg by mouth at bedtime.    . Blood Glucose Monitoring Suppl (ACCU-CHEK AVIVA PLUS) W/DEVICE KIT 1 Device by Does not apply route once. 1 kit 0  . clonazePAM (KLONOPIN) 1 MG tablet Take 1 mg by mouth 2 (two) times daily as needed for anxiety.    . gabapentin (NEURONTIN) 300 MG capsule 1 po QHS; may increase to 2 po QHS after 1 week if night sweats persist    . glucagon 1 MG injection Inject 1 mg into the  muscle once as needed. 1 each 12  . glucose blood (ACCU-CHEK AVIVA PLUS) test strip USE TO TEST BLOOD SUGAR 7 TIMES DAILY, and lancets 7/day. DX E10.41 250 each 11  . HYDROcodone-acetaminophen (NORCO/VICODIN) 5-325 MG tablet Take 1 tablet by mouth every 6 (six) hours as needed for moderate pain.    Marland Kitchen ibuprofen (ADVIL,MOTRIN) 800 MG tablet Take 800 mg by mouth.    . Insulin Pen Needle 31G X 8 MM MISC Use as directed     . NOVOLOG 100 UNIT/ML injection INJECT 40 UNITS DAILY PER INSULIN PUMP (MAX DOSEAGE) 20 mL 3  . omeprazole (PRILOSEC) 20 MG capsule Take 20 mg by mouth 2 (two) times daily.    Marland Kitchen PARoxetine (PAXIL) 40 MG tablet Take 1 tablet (40 mg  total) by mouth daily. 30 tablet 0  . rOPINIRole (REQUIP) 0.5 MG tablet TAKE THREE TABLETS BY MOUTH AT BEDTIME 90 tablet 1  . ondansetron (ZOFRAN) 4 MG tablet Take 1 tablet (4 mg total) by mouth every 8 (eight) hours as needed for nausea or vomiting. 30 tablet 1   No current facility-administered medications for this visit.    No Known Allergies   Review of Systems: All systems reviewed and negative except where noted in HPI.     Physical Exam: BP 130/64 (BP Location: Left Arm, Patient Position: Sitting, Cuff Size: Normal)   Pulse 88   Ht 5' 5.75" (1.67 m)   Wt 135 lb 8 oz (61.5 kg)   BMI 22.04 kg/m  Constitutional: Pleasant,well-developed, female in no acute distress. HEENT: Normocephalic and atraumatic. Conjunctivae are normal. No scleral icterus. Neck supple.  Cardiovascular: Normal rate, regular rhythm.  Pulmonary/chest: Effort normal and breath sounds normal. No wheezing, rales or rhonchi. Abdominal: Soft, nondistended, nontender.  There are no masses palpable. No hepatomegaly. Extremities: no edema Lymphadenopathy: No cervical adenopathy noted. Neurological: Alert and oriented to person place and time. Skin: Skin is warm and dry. No rashes noted. Psychiatric: Normal mood and affect. Behavior is normal.   ASSESSMENT AND PLAN: 42 year old female with type 1 diabetes here for follow-up visit for chronic abdominal bloating, loose stools, nausea.  Workup as outlined above. Small intestine bacterial overgrowth could certainly cause this presentation in the setting of type 1 diabetes / autonomic dysfunction - she has responded to Rifaximin in the past, however last trial did not provide as much benefit. She does not have pancreatic insufficiency and reassured her about this. She otherwise could have food intolerances in the setting of irritable bowel syndrome. She doesn't have celiac that we're aware of however need to check total IgA level to ensure serologies were not falsely  negative. Prior imaging and endoscopy has been normal however biopsies have not been taken to rule out microscopic colitis. That being said abdominal bloating and gas appears to be her main symptom, not as much loose stool.  Recommend the following at this time: - breath testing to more objectively evaluate for small bowel bacterial overgrowth. If positive will give a prolonged course of Rifaximin.  - basic labs - CBC, CMET, TSH, total IgA level - stop Creon - stop Probiotics - Zofran 59m-8mg every 8 hours PRN nausea, may also help diarrhea if component of IBS - trial of Beano OTC for bloating - trial of IB gard - breath testing negative, may consider trial of Viberzi for loose stools - pending course, may consider colonoscopy with biopsies to rule out microscopic colitis  Further recommendations pending results of testing and her course. She  agreed.   Almedia Cellar, MD [facility] Pager [phone]

## 2017-06-20 ENCOUNTER — Telehealth: Payer: Self-pay

## 2017-06-20 DIAGNOSIS — K529 Noninfective gastroenteritis and colitis, unspecified: Secondary | ICD-10-CM

## 2017-06-20 DIAGNOSIS — R110 Nausea: Secondary | ICD-10-CM

## 2017-06-20 DIAGNOSIS — R140 Abdominal distension (gaseous): Secondary | ICD-10-CM

## 2017-06-20 NOTE — Telephone Encounter (Signed)
Called and spoke to pt. Relayed results and recommendations.  All questions were answered.  She indicated she could go to the lab in the am and understands she needs to be fasting.   Yetta Flock, MD  Roetta Sessions, CMA        Jan can you please relay the following:  - CBC looks good, no anemia  - Sodium is low however it corrects to 130 when accounting for elevated glucose. It appears she has had chronically low Sodium. I think it may be worthwhile to check her for adrenal insufficiency. At her convenience, I would like her to come back to the lab in the morning, fasting, for cortisol level if you can order that for her  - I would also like to add total IgA level if you can order that as well (ensure prior celiac labs were not falsely negative)   We will relay results once back. Thank

## 2017-06-21 ENCOUNTER — Other Ambulatory Visit (INDEPENDENT_AMBULATORY_CARE_PROVIDER_SITE_OTHER): Payer: Medicaid Other

## 2017-06-21 DIAGNOSIS — R140 Abdominal distension (gaseous): Secondary | ICD-10-CM | POA: Diagnosis not present

## 2017-06-21 DIAGNOSIS — R110 Nausea: Secondary | ICD-10-CM | POA: Diagnosis not present

## 2017-06-21 DIAGNOSIS — K529 Noninfective gastroenteritis and colitis, unspecified: Secondary | ICD-10-CM | POA: Diagnosis not present

## 2017-06-21 LAB — CORTISOL: CORTISOL PLASMA: 10.4 ug/dL

## 2017-06-21 LAB — IGA: IgA: 136 mg/dL (ref 68–378)

## 2017-07-02 ENCOUNTER — Telehealth: Payer: Self-pay

## 2017-07-02 NOTE — Telephone Encounter (Signed)
Called and spoke to pt to follow up regarding Breath Test (SIBO) Dr. Havery Moros ordered on 06-19-17 but we haven't rec'd results for as of today, 07-02-17. Pt indicated she has rec'd the materials from [facility] but she has not submitted it yet. She indicated she will try to do so this week.

## 2017-07-03 NOTE — Telephone Encounter (Signed)
Thanks for the follow up Jan

## 2017-07-12 ENCOUNTER — Telehealth: Payer: Self-pay | Admitting: Gastroenterology

## 2017-07-12 NOTE — Telephone Encounter (Signed)
Called and spoke to Margaret with [facility].  Gave her the diagnostic ICD-10 codes K52.9, R14.0 and R11.0.

## 2017-07-12 NOTE — Telephone Encounter (Signed)
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 I think this is causing her symptoms and has been suspected as the etiology. I would like to treat her again with Rifaximin 550mg  TID for 2 weeks. If no significant response to this, she should let us know, will try an alternative antibiotic regimen.  Almyra Free can you please let her know and order Rifaximin? Hopefully it will be covered under this diagnosis. Thanks

## 2017-07-13 ENCOUNTER — Telehealth: Payer: Self-pay

## 2017-07-13 ENCOUNTER — Other Ambulatory Visit: Payer: Self-pay

## 2017-07-13 MED ORDER — RIFAXIMIN 550 MG PO TABS: 550.0000 mg | ORAL_TABLET | Freq: Three times a day (TID) | ORAL | 0 refills | Status: DC

## 2017-07-13 NOTE — Telephone Encounter (Signed)
Rec'd notice from [facility] that Xifaxan needs a prior authorization.  Resent script to [facility].

## 2017-07-13 NOTE — Telephone Encounter (Signed)
Patient advised of results and recommendations. Rx sent to her pharmacy, [facility]. She understands to contact our office if she is not having any significant response to this treatment.

## 2017-07-26 ENCOUNTER — Telehealth: Payer: Self-pay | Admitting: Gastroenterology

## 2017-07-26 NOTE — Telephone Encounter (Signed)
I still have not heard back from [facility]. I will call them tomorrow to check on status but in the meantime I have put aside samples for her, as we recently got some more.

## 2017-07-26 NOTE — Telephone Encounter (Signed)
Jan can you check on this patient's prior auth for Rifaximin? If it's not approved we can try to get her a sample but I don't think we have any in the office at this time. thanks

## 2017-07-26 NOTE — Telephone Encounter (Signed)
Patient states that she is still having diarrhea and feels weak. Imodium does not seem to help. She does have occasional nausea, controlled by medication, denies any vomiting. Sugars have been higher. She still has not gotten her Xifaxan, I know Jan was working on this.

## 2017-07-27 NOTE — Telephone Encounter (Signed)
Called [employer]. They still have not gotten approval;  it is still pending.  Called pt and LM regarding pending approval but in the meantime we got more samples and I have left some at the front desk.

## 2017-07-30 DIAGNOSIS — J3489 Other specified disorders of nose and nasal sinuses: Secondary | ICD-10-CM | POA: Insufficient documentation

## 2017-07-30 NOTE — Telephone Encounter (Signed)
Pt picked up samples

## 2017-08-01 NOTE — Telephone Encounter (Signed)
Rec'd fax from [employer] that prior auth for Xifaxan was denied due to "they need the patient to also try and fail Flagyl".   Patient has already picked up Xifaxan samples. Called and let [employer] ([facility]) know.

## 2017-08-07 ENCOUNTER — Telehealth: Payer: Self-pay | Admitting: Gastroenterology

## 2017-08-07 NOTE — Telephone Encounter (Signed)
Spoke to patient, she states her diarrhea is somewhat better, is every other day now and not as watery. Still having diarrhea though. She only has 2 1/2 days left of sample medications. Please advise.  [name]-Patient was also asking about prior approval to see if our office has received anything from insurance.

## 2017-08-07 NOTE — Telephone Encounter (Signed)
[employer] was not able to get approval, so we gave pt samples.  We have more samples if needed. Dr. Loni Muse, please let me know if I should put some aside for pt.

## 2017-08-07 NOTE — Telephone Encounter (Signed)
Okay thanks. She should complete the full 2 weeks and then we will reassess her response to therapy. Thanks

## 2017-08-07 NOTE — Telephone Encounter (Signed)
Thanks Jan. Do you know how many days she has been on the Rifaximin sample? If it is working, hopefully she has been given a full 2 weeks and she can complete that. If she only got a weeks worth, we should try to give her what she needs to total 2 weeks. Thanks

## 2017-08-10 NOTE — Pre-Procedure Instructions (Signed)
[name]  08/10/2017      [id number] [name], [place] [address] Phone: [phone] Fax: [phone]    Your procedure is scheduled on February 1  Report to [facility] at 0730 A.M.  Call this number if you have problems the morning of surgery:  [phone]   Remember:  Do not eat food or drink liquids after midnight.  Continue all medications as directed by your physician except follow these medication instructions before surgery below   Take these medicines the morning of surgery with A SIP OF WATER  albuterol (PROVENTIL HFA;VENTOLIN HFA)  clonazePAM (KLONOPIN) HYDROcodone-acetaminophen (NORCO/VICODIN) omeprazole (PRILOSEC) PARoxetine (PAXIL) rifaximin (XIFAXAN)  7 days prior to surgery STOP taking any Aspirin(unless otherwise instructed by your surgeon), Aleve, Naproxen, Ibuprofen, Motrin, Advil, Goody's, BC's, all herbal medications, fish oil, and all vitamins  Patients with Insulin Pumps    . For patients with Insulin Pumps: o Contact your diabetes doctor for specific instructions before surgery. o Decrease basal insulin rates by 20% at midnight the night before surgery. o Note that if your surgery is planned to be longer than 2 hours, your insulin pump will be removed and intravenous (IV) insulin will be started and managed by the nurses and anesthesiologist. You will be able to restart your insulin pump once you are awake and able to manage it. o Make sure to bring insulin pump supplies to the hospital with you in case your site needs to be changed.     Do not wear jewelry, make-up or nail polish.  Do not wear lotions, powders, or perfumes, or deodorant.  Do not shave 48 hours prior to surgery.    Do not bring valuables to the hospital.  [facility] is not responsible for any belongings or valuables.  Contacts, dentures or bridgework may not be worn into surgery.  Leave  your suitcase in the car.  After surgery it may be brought to your room.  For patients admitted to the hospital, discharge time will be determined by your treatment team.  Patients discharged the day of surgery will not be allowed to drive home.    Special instructions:   [place]- Preparing For Surgery  Before surgery, you can play an important role. Because skin is not sterile, your skin needs to be as free of germs as possible. You can reduce the number of germs on your skin by washing with CHG (chlorahexidine gluconate) Soap before surgery.  CHG is an antiseptic cleaner which kills germs and bonds with the skin to continue killing germs even after washing.  Please do not use if you have an allergy to CHG or antibacterial soaps. If your skin becomes reddened/irritated stop using the CHG.  Do not shave (including legs and underarms) for at least 48 hours prior to first CHG shower. It is OK to shave your face.  Please follow these instructions carefully.   1. Shower the NIGHT BEFORE SURGERY and the MORNING OF SURGERY with CHG.   2. If you chose to wash your hair, wash your hair first as usual with your normal shampoo.  3. After you shampoo, rinse your hair and body thoroughly to remove the shampoo.  4. Use CHG as you would any other liquid soap. You can apply CHG directly to the skin and wash gently with a scrungie or a clean washcloth.   5. Apply the CHG Soap to your body  ONLY FROM THE NECK DOWN.  Do not use on open wounds or open sores. Avoid contact with your eyes, ears, mouth and genitals (private parts). Wash Face and genitals (private parts)  with your normal soap.  6. Wash thoroughly, paying special attention to the area where your surgery will be performed.  7. Thoroughly rinse your body with warm water from the neck down.  8. DO NOT shower/wash with your normal soap after using and rinsing off the CHG Soap.  9. Pat yourself dry with a CLEAN TOWEL.  10. Wear CLEAN  PAJAMAS to bed the night before surgery, wear comfortable clothes the morning of surgery  11. Place CLEAN SHEETS on your bed the night of your first shower and DO NOT SLEEP WITH PETS.    Day of Surgery: Do not apply any deodorants/lotions. Please wear clean clothes to the hospital/surgery center.      Please read over the following fact sheets that you were given.

## 2017-08-11 ENCOUNTER — Emergency Department
Admission: EM | Admit: 2017-08-11 | Discharge: 2017-08-11 | Disposition: A | Discharge status: Home / Self Care | Payer: Medicaid Other | Attending: Emergency Medicine | Admitting: Emergency Medicine

## 2017-08-11 ENCOUNTER — Other Ambulatory Visit: Payer: Self-pay

## 2017-08-11 ENCOUNTER — Emergency Department: Payer: Medicaid Other

## 2017-08-11 DIAGNOSIS — F17210 Nicotine dependence, cigarettes, uncomplicated: Secondary | ICD-10-CM | POA: Insufficient documentation

## 2017-08-11 DIAGNOSIS — J449 Chronic obstructive pulmonary disease, unspecified: Secondary | ICD-10-CM | POA: Diagnosis not present

## 2017-08-11 DIAGNOSIS — J45909 Unspecified asthma, uncomplicated: Secondary | ICD-10-CM | POA: Diagnosis not present

## 2017-08-11 DIAGNOSIS — E1049 Type 1 diabetes mellitus with other diabetic neurological complication: Secondary | ICD-10-CM | POA: Insufficient documentation

## 2017-08-11 DIAGNOSIS — Z79899 Other long term (current) drug therapy: Secondary | ICD-10-CM | POA: Diagnosis not present

## 2017-08-11 DIAGNOSIS — M4306 Spondylolysis, lumbar region: Secondary | ICD-10-CM | POA: Insufficient documentation

## 2017-08-11 DIAGNOSIS — M79605 Pain in left leg: Secondary | ICD-10-CM | POA: Diagnosis present

## 2017-08-11 DIAGNOSIS — M5416 Radiculopathy, lumbar region: Secondary | ICD-10-CM | POA: Insufficient documentation

## 2017-08-11 MED ORDER — CYCLOBENZAPRINE HCL 10 MG PO TABS: 10.0000 mg | ORAL_TABLET | Freq: Three times a day (TID) | ORAL | 0 refills | Status: DC | PRN

## 2017-08-11 MED ORDER — HYDROCODONE-ACETAMINOPHEN 5-325 MG PO TABS: 1.0000 | ORAL_TABLET | ORAL | 0 refills | Status: DC | PRN

## 2017-08-11 MED ORDER — MELOXICAM 15 MG PO TABS: 15.0000 mg | ORAL_TABLET | Freq: Every day | ORAL | 0 refills | Status: DC

## 2017-08-11 NOTE — ED Provider Notes (Signed)
[facility] Emergency Department Provider Note ____________________________________________  Time seen: Approximately 3:15 PM  I have reviewed the triage vital signs and the nursing notes.   HISTORY  Chief Complaint Leg Pain    HPI [name] is a 41 y.o. female who presents to the emergency department for evaluation of left leg and buttock pain that started over a week ago. She states that while lying in bed, she simply reached over to get a glass off of the bedside table and felt an immediate burning sensation in her left butt cheek and left leg. Symptoms partially relieved by ibuprofen over the first few days, but now it doesn't seem that anything takes the pain away.   Past Medical History:  Diagnosis Date  . Anxiety state, unspecified 03/26/2009  . ASTHMA 02/02/2007  . DEPRESSION 02/16/2009  . DIABETES MELLITUS, TYPE I 02/02/2007  . Endometriosis   . GERD 12/21/2008  . Lesion of vocal cord   . Shortness of breath 09/25/2007  . Vocal fold leukoplakia     Patient Active Problem List   Diagnosis Date Noted  . Multinodular goiter 07/29/2015  . Type 1 diabetes mellitus with neurological manifestations, uncontrolled ([place]) 01/27/2014  . Restless leg 11/07/2013  . Current tobacco use 11/07/2013  . Triggering of digit 08/13/2013  . Hematuria 03/26/2012  . Chronic obstructive pulmonary disease ([place]) 02/15/2012  . CN (constipation) 01/01/2012  . H/O respiratory system disease 10/04/2011  . Cholesteatoma 09/11/2011  . Allergy to environmental factors 09/11/2011  . Anxiety 05/01/2011  . Myalgia 11/10/2010  . AIRWAY INFLAMMATION 06/08/2010  . Nonspecific (abnormal) findings on radiological and other examination of body structure 05/12/2010  . [place] LUNG FIELD 05/12/2010  . Type 1 diabetes mellitus ([place]) 04/01/2010  . Acid reflux 04/01/2010  . Absence of bladder continence 04/01/2010  . VULVAR ABSCESS 03/18/2010  . UTI  03/09/2010  . INSOMNIA 02/18/2010  . FOLLICULITIS 17/00/1749  . ANXIETY STATE, UNSPECIFIED 03/26/2009  . NAUSEA 02/19/2009  . DEPRESSION 02/16/2009  . GERD 12/21/2008  . ASTHMATIC BRONCHITIS, ACUTE 08/12/2008  . ACUTE MAXILLARY SINUSITIS 05/04/2008  . MOLE 01/29/2008  . Diarrhea 10/16/2007  . SMOKER 09/25/2007  . SHORTNESS OF BREATH 09/25/2007  . ASTHMA 02/02/2007    Past Surgical History:  Procedure Laterality Date  . ABDOMINAL HYSTERECTOMY  2009   BSO    Prior to Admission medications   Medication Sig Start Date End Date Taking? Authorizing Provider  albuterol (PROVENTIL HFA;VENTOLIN HFA) 108 (90 Base) MCG/ACT inhaler Inhale 2 puffs into the lungs every 6 (six) hours as needed for wheezing or shortness of breath. 05/12/16   Earleen Newport, MD  atorvastatin (LIPITOR) 10 MG tablet Take 10 mg by mouth at bedtime.    [provider]  Blood Glucose Monitoring Suppl (ACCU-CHEK AVIVA PLUS) W/DEVICE KIT 1 Device by Does not apply route once. 01/27/14   Renato Shin, MD  cholecalciferol (VITAMIN D) 1000 units tablet Take 1,000 Units by mouth daily.    [provider]  clonazePAM (KLONOPIN) 1 MG tablet Take 1 mg by mouth at bedtime as needed for anxiety.     [provider]  cyclobenzaprine (FLEXERIL) 10 MG tablet Take 1 tablet (10 mg total) by mouth 3 (three) times daily as needed for muscle spasms. 08/11/17   Anndrea Mihelich B, FNP  gabapentin (NEURONTIN) 300 MG capsule Take 900 mg by mouth at bedtime.  12/03/13   [provider]  glucagon 1 MG  injection Inject 1 mg into the muscle once as needed. 07/29/15   Renato Shin, MD  glucose blood (ACCU-CHEK AVIVA PLUS) test strip USE TO TEST BLOOD SUGAR 7 TIMES DAILY, and lancets 7/day. DX E10.41 07/29/15   Renato Shin, MD  HYDROcodone-acetaminophen (NORCO/VICODIN) 5-325 MG tablet Take 1 tablet by mouth every 4 (four) hours as needed for moderate pain. 08/11/17 08/11/18  Harjas Biggins, Johnette Abraham B, FNP  ibuprofen  (ADVIL,MOTRIN) 200 MG tablet Take 600-800 mg by mouth every 8 (eight) hours as needed (for pain.).    [provider]  Insulin Pen Needle 31G X 8 MM MISC Use as directed     [provider]  meloxicam (MOBIC) 15 MG tablet Take 1 tablet (15 mg total) by mouth daily. 08/11/17   Seryna Marek, Johnette Abraham B, FNP  Multiple Vitamin (MULTIVITAMIN WITH MINERALS) TABS tablet Take 1 tablet by mouth daily.    [provider]  NOVOLOG 100 UNIT/ML injection INJECT 40 UNITS DAILY PER INSULIN PUMP (MAX DOSEAGE) 01/22/17   Renato Shin, MD  omeprazole (PRILOSEC) 40 MG capsule Take 40 mg by mouth daily before breakfast. 08/05/17   [provider]  ondansetron (ZOFRAN) 4 MG tablet Take 1 tablet (4 mg total) by mouth every 8 (eight) hours as needed for nausea or vomiting. 06/19/17   Yetta Flock, MD  PARoxetine (PAXIL) 40 MG tablet Take 1 tablet (40 mg total) by mouth daily. 11/10/13   Renato Shin, MD  rifaximin (XIFAXAN) 550 MG TABS tablet Take 1 tablet (550 mg total) by mouth 3 (three) times daily. 07/13/17   Armbruster, Carlota Raspberry, MD  rOPINIRole (REQUIP) 0.5 MG tablet TAKE THREE TABLETS BY MOUTH AT BEDTIME Patient not taking: Reported on 08/07/2017 08/20/13   Renato Shin, MD  rOPINIRole (REQUIP) 1 MG tablet Take 1 mg by mouth at bedtime.    [provider]    Allergies Patient has no known allergies.  Family History  Problem Relation Age of Onset  . Diabetes Mother   . Diabetes Maternal Aunt   . Emphysema Maternal Aunt   . Emphysema Maternal Uncle   . Diabetes Maternal Grandfather   . Lung cancer Maternal Aunt   . Colon cancer Neg Hx   . Kidney disease Neg Hx   . Liver disease Neg Hx     Social History Social History   Tobacco Use  . Smoking status: Current Every Day Smoker    Packs/day: 2.00    Years: 20.00    Pack years: 40.00    Types: Cigarettes  . Smokeless tobacco: Never Used  . Tobacco comment: form given 06/05/14  Substance Use Topics  . Alcohol  use: No    Alcohol/week: 0.0 oz  . Drug use: No    Review of Systems Constitutional: Negative for fever Cardiovascular: Negative for chest pain. Respiratory: Negative for cough Musculoskeletal: Positive for left lower back pain and left leg pain Skin: Negative for rash, lesion, or wound  Neurological: Positive for paresthesias in the LLE.  ____________________________________________   PHYSICAL EXAM:  VITAL SIGNS: ED Triage Vitals  Enc Vitals Group     BP 08/11/17 1440 (!) 160/92     Pulse Rate 08/11/17 1440 (!) 133     Resp 08/11/17 1440 16     Temp 08/11/17 1440 98.9 F (37.2 C)     Temp Source 08/11/17 1440 Oral     SpO2 08/11/17 1440 97 %     Weight 08/11/17 1441 125 lb (56.7 kg)  Height 08/11/17 1441 _0  (1.626 m)     Head Circumference --      Peak Flow --      Pain Score 08/11/17 1440 9     Pain Loc --      Pain Edu? --      Excl. in [place]? --     Constitutional: Alert and oriented. Well appearing and in no acute distress. Eyes: Conjunctivae are clear without discharge or drainage Head: Atraumatic Neck: Active range of motion observed. Respiratory: Breath sounds clear to auscultation Musculoskeletal: Tenderness in the left lumbar/SI area.  Negative straight leg raise.  No tenderness on exam of the left hip. Neurologic: Motor and sensory function intact, specifically over the left lower extremity. Skin: Warm, dry, intact. Psychiatric: Affect and behavior are appropriate.  ____________________________________________   LABS (all labs ordered are listed, but only abnormal results are displayed)  Labs Reviewed - No data to display ____________________________________________  RADIOLOGY  Image of the lumbar spine reveals bilateral pars defects with anterior listhesis of L5 on S1. ____________________________________________   PROCEDURES  Procedures  ____________________________________________   INITIAL IMPRESSION / ASSESSMENT AND PLAN / ED  COURSE  [name] is a 42 y.o. female who presents to the emergency department for evaluation of acute onset left low back pain that radiates into the left lower extremity.  X-ray reveals bilateral pars deficits.  She has had no injury.  She will be treated with Flexeril, Norco, and meloxicam.  She was advised to follow-up with her primary care provider if symptoms are not improving over the next 2-3 days.  She was instructed to return to the emergency department for symptoms that change or worsen if unable to schedule an appointment.  Medications - No data to display  Pertinent labs & imaging results that were available during my care of the patient were reviewed by me and considered in my medical decision making (see chart for details).  _________________________________________   FINAL CLINICAL IMPRESSION(S) / ED DIAGNOSES  Final diagnoses:  Acute lumbar radiculopathy  Pars defect of lumbar spine    ED Discharge Orders        Ordered    meloxicam (MOBIC) 15 MG tablet  Daily     08/11/17 1633    cyclobenzaprine (FLEXERIL) 10 MG tablet  3 times daily PRN     08/11/17 1633    HYDROcodone-acetaminophen (NORCO/VICODIN) 5-325 MG tablet  Every 4 hours PRN     08/11/17 1633       If controlled substance prescribed during this visit, 12 month history viewed on the [place] prior to issuing an initial prescription for Schedule II or III opiod.    Victorino Dike, FNP 08/11/17 1805    Carrie Mew, MD 08/11/17 [place]

## 2017-08-11 NOTE — ED Triage Notes (Signed)
Pt reports that left leg and buttocks are hurting - she states she was laying on the bed and turned to get a drink and "stretched" a muscle

## 2017-08-13 ENCOUNTER — Other Ambulatory Visit: Payer: Self-pay

## 2017-08-13 ENCOUNTER — Encounter (HOSPITAL_COMMUNITY): Payer: Self-pay

## 2017-08-13 ENCOUNTER — Encounter (HOSPITAL_COMMUNITY)
Admission: RE | Admit: 2017-08-13 | Discharge: 2017-08-13 | Disposition: A | Discharge status: Home / Self Care | Payer: Medicaid Other | Source: Ambulatory Visit | Attending: Otolaryngology | Admitting: Otolaryngology

## 2017-08-13 DIAGNOSIS — Z01812 Encounter for preprocedural laboratory examination: Secondary | ICD-10-CM | POA: Insufficient documentation

## 2017-08-13 DIAGNOSIS — R000 Tachycardia, unspecified: Secondary | ICD-10-CM | POA: Diagnosis not present

## 2017-08-13 DIAGNOSIS — E119 Type 2 diabetes mellitus without complications: Secondary | ICD-10-CM | POA: Diagnosis not present

## 2017-08-13 DIAGNOSIS — Z01810 Encounter for preprocedural cardiovascular examination: Secondary | ICD-10-CM | POA: Insufficient documentation

## 2017-08-13 LAB — BASIC METABOLIC PANEL
Anion gap: 12 (ref 5–15)
BUN: 8 mg/dL (ref 6–20)
CHLORIDE: 99 mmol/L — AB (ref 101–111)
CO2: 23 mmol/L (ref 22–32)
Calcium: 9.8 mg/dL (ref 8.9–10.3)
Creatinine, Ser: 0.71 mg/dL (ref 0.44–1.00)
GFR calc non Af Amer: >60 mL/min (ref >60)
Glucose, Bld: 231 mg/dL — ABNORMAL HIGH (ref 65–99)
POTASSIUM: 4.2 mmol/L (ref 3.5–5.1)
SODIUM: 134 mmol/L — AB (ref 135–145)

## 2017-08-13 LAB — CBC
HEMATOCRIT: 47.8 % — AB (ref 36.0–46.0)
Hemoglobin: 16.5 g/dL — ABNORMAL HIGH (ref 12.0–15.0)
MCH: 31.5 pg (ref 26.0–34.0)
MCHC: 34.5 g/dL (ref 30.0–36.0)
MCV: 91.2 fL (ref 78.0–100.0)
Platelets: 215 10*3/uL (ref 150–400)
RBC: 5.24 MIL/uL — ABNORMAL HIGH (ref 3.87–5.11)
RDW: 12.9 % (ref 11.5–15.5)
WBC: 9.3 10*3/uL (ref 4.0–10.5)

## 2017-08-13 LAB — HEMOGLOBIN A1C
Hgb A1c MFr Bld: 8.3 % — ABNORMAL HIGH (ref 4.8–5.6)
Mean Plasma Glucose: 191.51 mg/dL

## 2017-08-13 LAB — GLUCOSE, CAPILLARY: Glucose-Capillary: 260 mg/dL — ABNORMAL HIGH (ref 65–99)

## 2017-08-13 NOTE — Progress Notes (Addendum)
PCP: Dr. Rex Kras Cardiologist: Denies Endocrinology: Dr.  [name]  EKG: Today CXR: Denies ECHO: Denies Stress Test: Denies Cardiac Cath: Denies  Fasting Blood Sugar- "Typically 116" CBG 260 at PAT appt, last ate 10am, 2 sausage biscuits Checks Blood Sugar 3 times a day.  Pt has an insulin pump, unable to find basal rate at PAT appt.  Instructed pt to call Dr. Cordelia Pen office for insulin pump instructions prior to surgery, if none given, instructed to lower basal rate by 20%. Pt stated the NP at his office could help her with the pump settings.  Diabetes Coordinator, Leilani Able, called and made aware of insulin pump.  Pt instructed no smoking 24 hours prior to surgery. Instructed to bring insulin pump supplies with her.  Pt extremely anxious at PAT appt, requested to that continue Klonopin as prescribed--states she has not had in 5 days.  Patient denies shortness of breath, fever, cough, and chest pain at PAT appointment.  Patient verbalized understanding of instructions provided today at the PAT appointment.  Patient asked to review instructions at home and day of surgery.   Pt questioning if Right Nare was also going to be biopsied during surgery.  Sent IB msg to surgeon, may need new consent DOS.

## 2017-08-13 NOTE — Pre-Procedure Instructions (Signed)
[name]  08/13/2017      [id number] [name], [place] [address] Phone: [phone] Fax: [phone]    Your procedure is scheduled on February 1  Report to [facility] at 0730 A.M.  Call this number if you have problems the morning of surgery:  [phone]   Remember:  Do not eat food or drink liquids after midnight.  Continue all medications as directed by your physician except follow these medication instructions before surgery below   Take these medicines the morning of surgery with A SIP OF WATER:  albuterol (PROVENTIL HFA;VENTOLIN HFA)  clonazePAM (KLONOPIN) HYDROcodone-acetaminophen (NORCO/VICODIN) omeprazole (PRILOSEC) PARoxetine (PAXIL) rifaximin (XIFAXAN) Flexeril   7 days prior to surgery STOP taking any Aspirin(unless otherwise instructed by your surgeon), Aleve, Naproxen, Ibuprofen, Mobic, Motrin, Advil, Goody's, BC's, all herbal medications, fish oil, and all vitamins  Patients with Insulin Pumps    . For patients with Insulin Pumps: o Contact your diabetes doctor for specific instructions before surgery. o Decrease basal insulin rates by 20% at midnight the night before surgery. o Note that if your surgery is planned to be longer than 2 hours, your insulin pump will be removed and intravenous (IV) insulin will be started and managed by the nurses and anesthesiologist. You will be able to restart your insulin pump once you are awake and able to manage it. o Make sure to bring insulin pump supplies to the hospital with you in case your site needs to be changed.     How to Manage Your Diabetes Before and After Surgery  Why is it important to control my blood sugar before and after surgery? . Improving blood sugar levels before and after surgery helps healing and can limit problems. . A way of improving blood sugar control is eating a healthy diet by: o  Eating less  sugar and carbohydrates o  Increasing activity/exercise o  Talking with your doctor about reaching your blood sugar goals . High blood sugars (greater than 180 mg/dL) can raise your risk of infections and slow your recovery, so you will need to focus on controlling your diabetes during the weeks before surgery. . Make sure that the doctor who takes care of your diabetes knows about your planned surgery including the date and location.  How do I manage my blood sugar before surgery? . Check your blood sugar at least 4 times a day, starting 2 days before surgery, to make sure that the level is not too high or low. o Check your blood sugar the morning of your surgery when you wake up and every 2 hours until you get to the Short Stay unit. . If your blood sugar is less than 70 mg/dL, you will need to treat for low blood sugar: o Do not take insulin. o Treat a low blood sugar (less than 70 mg/dL) with  cup of clear juice (cranberry or apple), 4 glucose tablets, OR glucose gel. Recheck blood sugar in 15 minutes after treatment (to make sure it is greater than 70 mg/dL). If your blood sugar is not greater than 70 mg/dL on recheck, call [phone] o  for further instructions. . Report your blood sugar to the short stay nurse when you get to Short Stay.  . If you are admitted to the hospital after surgery: o Your blood sugar will be checked by the staff and you will probably be given insulin after  surgery (instead of oral diabetes medicines) to make sure you have good blood sugar levels. o The goal for blood sugar control after surgery is 80-180 mg/dL.     Do not wear jewelry, make-up or nail polish.  Do not wear lotions, powders, or perfumes, or deodorant.  Do not shave 48 hours prior to surgery.    Do not bring valuables to the hospital.  [facility] is not responsible for any belongings or valuables.  Contacts, dentures or bridgework may not be worn into surgery.  Leave your suitcase in the  car.  After surgery it may be brought to your room.  For patients admitted to the hospital, discharge time will be determined by your treatment team.  Patients discharged the day of surgery will not be allowed to drive home.    Special instructions:   [place]- Preparing For Surgery  Before surgery, you can play an important role. Because skin is not sterile, your skin needs to be as free of germs as possible. You can reduce the number of germs on your skin by washing with CHG (chlorahexidine gluconate) Soap before surgery.  CHG is an antiseptic cleaner which kills germs and bonds with the skin to continue killing germs even after washing.  Please do not use if you have an allergy to CHG or antibacterial soaps. If your skin becomes reddened/irritated stop using the CHG.  Do not shave (including legs and underarms) for at least 48 hours prior to first CHG shower. It is OK to shave your face.  Please follow these instructions carefully.   1. Shower the NIGHT BEFORE SURGERY and the MORNING OF SURGERY with CHG.   2. If you chose to wash your hair, wash your hair first as usual with your normal shampoo.  3. After you shampoo, rinse your hair and body thoroughly to remove the shampoo.  4. Use CHG as you would any other liquid soap. You can apply CHG directly to the skin and wash gently with a scrungie or a clean washcloth.   5. Apply the CHG Soap to your body ONLY FROM THE NECK DOWN.  Do not use on open wounds or open sores. Avoid contact with your eyes, ears, mouth and genitals (private parts). Wash Face and genitals (private parts)  with your normal soap.  6. Wash thoroughly, paying special attention to the area where your surgery will be performed.  7. Thoroughly rinse your body with warm water from the neck down.  8. DO NOT shower/wash with your normal soap after using and rinsing off the CHG Soap.  9. Pat yourself dry with a CLEAN TOWEL.  10. Wear CLEAN PAJAMAS to bed the night  before surgery, wear comfortable clothes the morning of surgery  11. Place CLEAN SHEETS on your bed the night of your first shower and DO NOT SLEEP WITH PETS.    Day of Surgery: Do not apply any deodorants/lotions. Please wear clean clothes to the hospital/surgery center.      Please read over the following fact sheets that you were given.

## 2017-08-14 NOTE — Progress Notes (Signed)
Anesthesia Chart Review:  Pt is a 42 year old female scheduled for direct laryngoscopy with operating telescope with biopsy, nasopharyngeal biopsy R nasal lesion on 08/17/2017 with Lind Guest, MD  - PCP is Tamsen Roers, MD - GI is Hart Robinsons, MD - Endocrinologist is Renato Shin, MD  PMH includes:  Type 1 DM, asthma, GERD. Current smoker. BMI 21  Medications include: albuterol, lipitor, novolog, prilosec, rifaximin  BP (!) 145/90   Pulse (!) 114   Temp 36.8 C   Resp 18   Ht 5' 5.25" (1.657 m)   Wt 126 lb 6.4 oz (57.3 kg)   SpO2 98%   BMI 20.87 kg/m   Preoperative labs reviewed.   - HbA1c 8.3, glucose 231  EKG 08/13/17: sinus tachycardia (105 bpm)  HR initially 114, was 105 on EKG. Pt was extremely anxious at pre-admission testing but had stopped taking her long-term klonopin dose 5 days prior.  RN instructed pt to restart Klonopin and if she wished to stop it in the future, to discuss weaning off of it with prescriber.  Pt also in pain at pre-admission testing.  Prior records review does not show pt is typically tachycardic.  Episodic tachycardia likely due to anxiety, stopping klonopin, and pain.    If no changes, I anticipate pt can proceed with surgery as scheduled.   Willeen Cass, FNP-BC [facility]/Anesthesiology Phone: [phone] 08/14/2017 12:05 PM

## 2017-08-15 ENCOUNTER — Other Ambulatory Visit (HOSPITAL_COMMUNITY): Payer: Medicaid Other

## 2017-08-16 IMAGING — CR DG CHEST 2V
1 series · 2 of 2 positions shown · non-contrast
Comparison: 09/25/2011 chest radiograph

CLINICAL DATA: 39-year-old female with cough, shortness of breath
and congestion for 5 days.

EXAM:
CHEST  2 VIEW

[Series 1: w chest pa · 0.14mm/px · 2 of 2 slices shown]
[im 1/2]
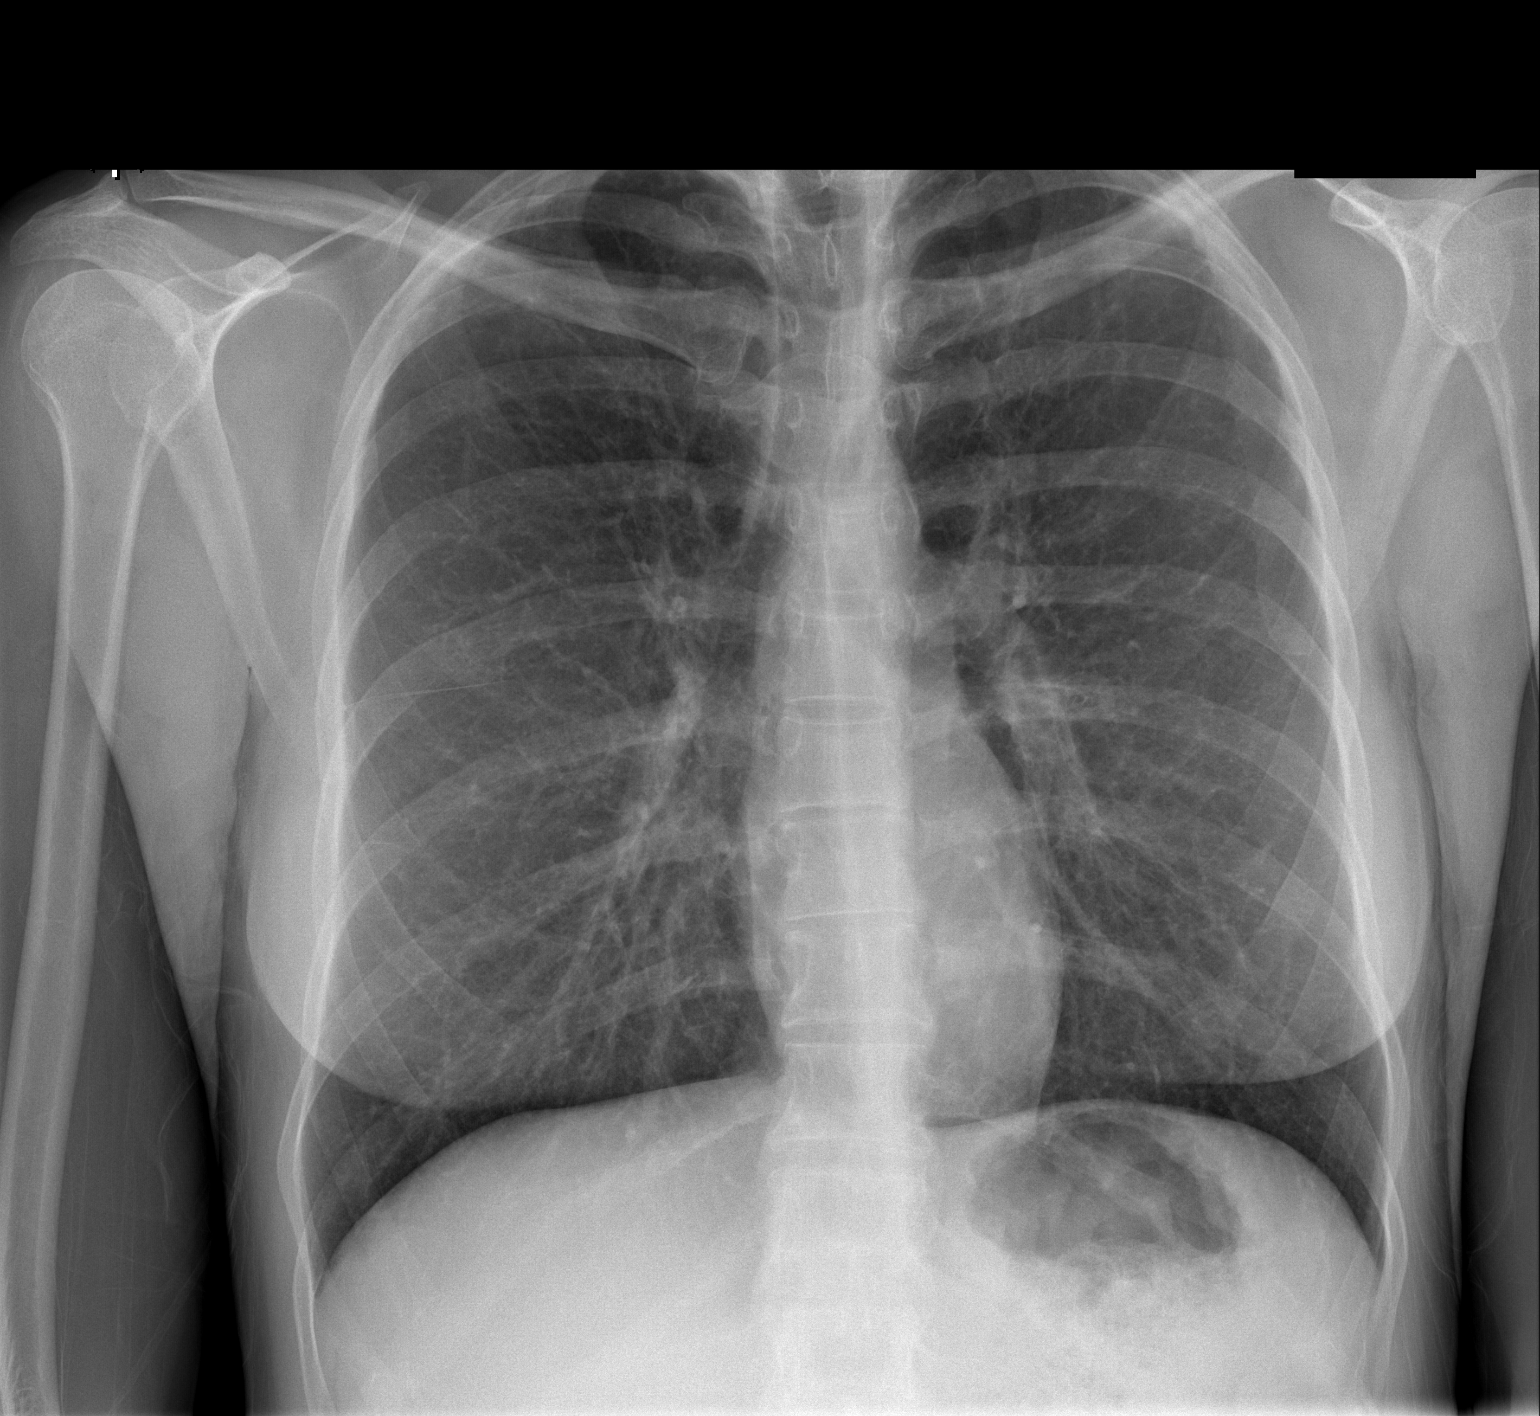
[im 2/2]
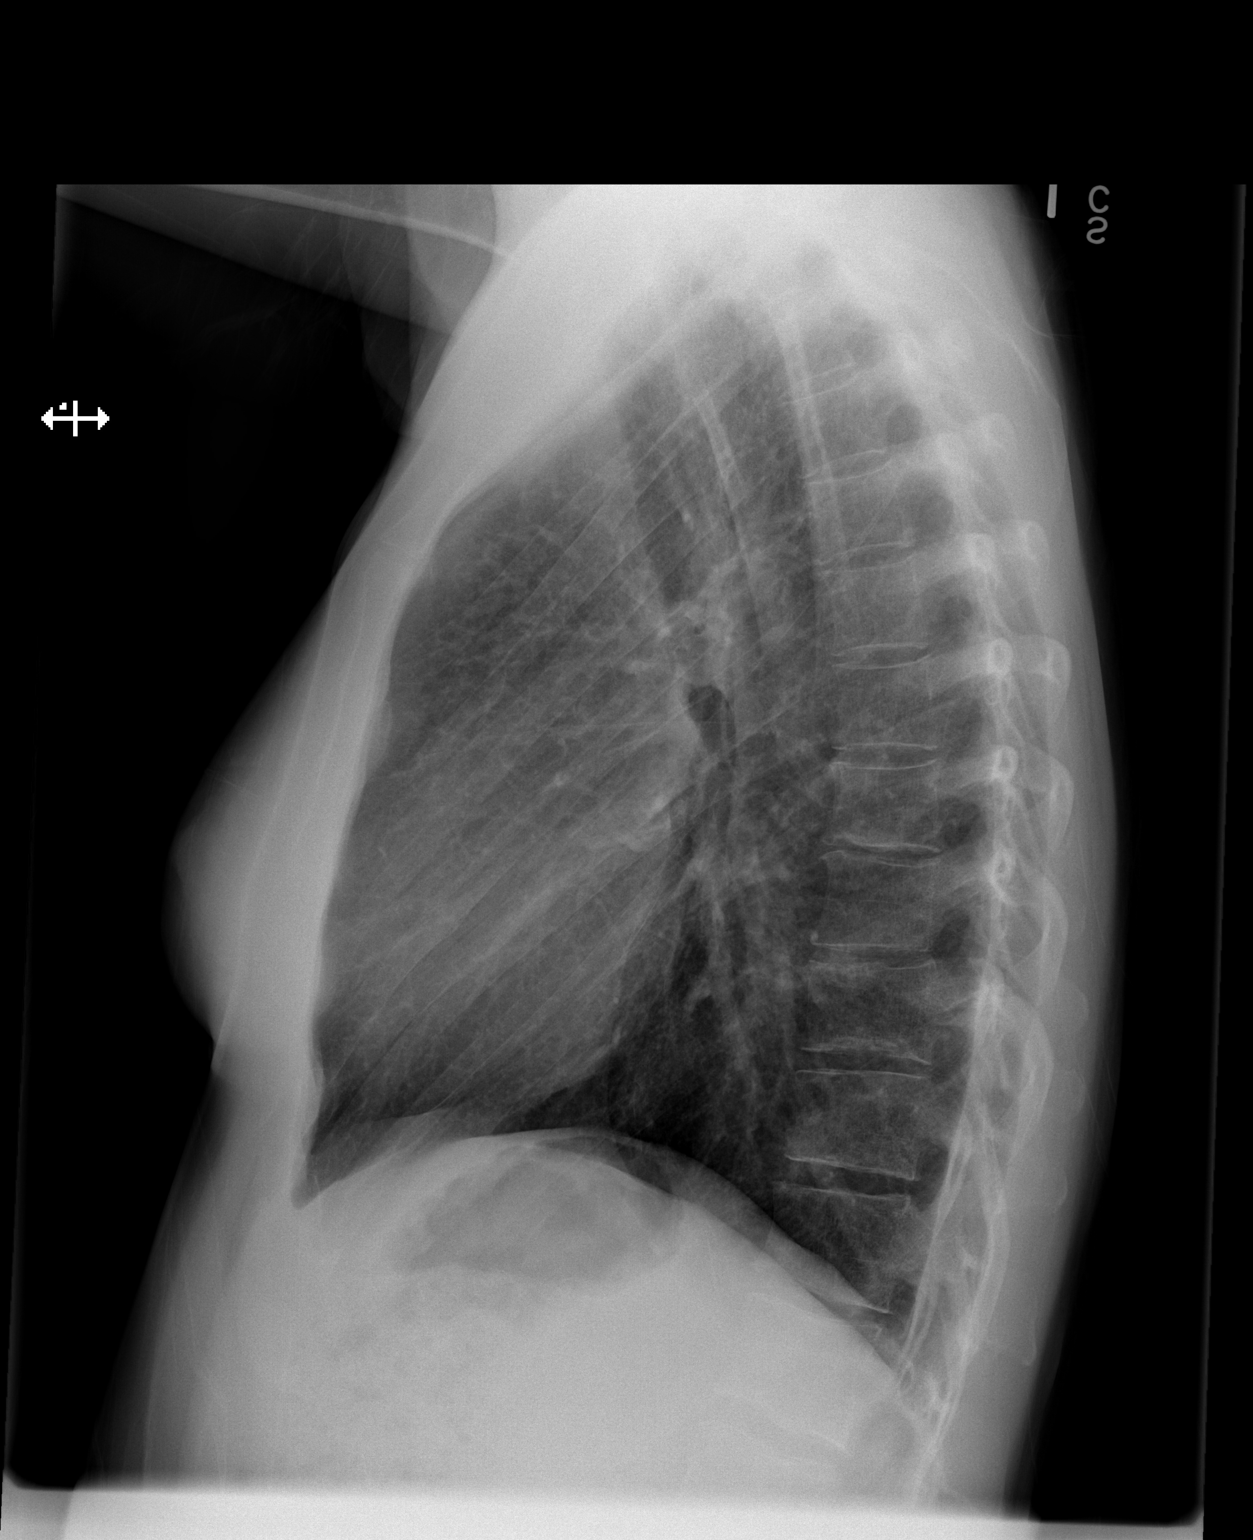

[2 of 2 positions shown; findings below may reference images not displayed]

FINDINGS: The cardiomediastinal silhouette is unremarkable.

Mild peribronchial thickening is unchanged.

There is no evidence of focal airspace disease, pulmonary edema,
suspicious pulmonary nodule/mass, pleural effusion, or pneumothorax.
No acute bony abnormalities are identified.
IMPRESSION: No evidence of acute cardiopulmonary disease.

## 2017-08-17 ENCOUNTER — Ambulatory Visit (HOSPITAL_COMMUNITY): Payer: Medicaid Other | Admitting: Certified Registered Nurse Anesthetist

## 2017-08-17 ENCOUNTER — Encounter (HOSPITAL_COMMUNITY)
Admission: RE | Disposition: A | Discharge status: Home / Self Care | Payer: Self-pay | Source: Ambulatory Visit | Attending: Otolaryngology

## 2017-08-17 ENCOUNTER — Ambulatory Visit (HOSPITAL_COMMUNITY): Payer: Medicaid Other | Admitting: Emergency Medicine

## 2017-08-17 ENCOUNTER — Ambulatory Visit (HOSPITAL_COMMUNITY)
Admission: RE | Admit: 2017-08-17 | Discharge: 2017-08-17 | Disposition: A | Discharge status: Home / Self Care | Payer: Medicaid Other | Source: Ambulatory Visit | Attending: Otolaryngology | Admitting: Otolaryngology

## 2017-08-17 ENCOUNTER — Encounter (HOSPITAL_COMMUNITY): Payer: Self-pay | Admitting: *Deleted

## 2017-08-17 DIAGNOSIS — L859 Epidermal thickening, unspecified: Secondary | ICD-10-CM | POA: Insufficient documentation

## 2017-08-17 DIAGNOSIS — E7800 Pure hypercholesterolemia, unspecified: Secondary | ICD-10-CM | POA: Insufficient documentation

## 2017-08-17 DIAGNOSIS — J3489 Other specified disorders of nose and nasal sinuses: Secondary | ICD-10-CM | POA: Insufficient documentation

## 2017-08-17 DIAGNOSIS — Z7951 Long term (current) use of inhaled steroids: Secondary | ICD-10-CM | POA: Diagnosis not present

## 2017-08-17 DIAGNOSIS — Z794 Long term (current) use of insulin: Secondary | ICD-10-CM | POA: Insufficient documentation

## 2017-08-17 DIAGNOSIS — Z79891 Long term (current) use of opiate analgesic: Secondary | ICD-10-CM | POA: Diagnosis not present

## 2017-08-17 DIAGNOSIS — Z79899 Other long term (current) drug therapy: Secondary | ICD-10-CM | POA: Diagnosis not present

## 2017-08-17 DIAGNOSIS — K219 Gastro-esophageal reflux disease without esophagitis: Secondary | ICD-10-CM | POA: Diagnosis not present

## 2017-08-17 DIAGNOSIS — Z823 Family history of stroke: Secondary | ICD-10-CM | POA: Insufficient documentation

## 2017-08-17 DIAGNOSIS — F17200 Nicotine dependence, unspecified, uncomplicated: Secondary | ICD-10-CM | POA: Diagnosis not present

## 2017-08-17 DIAGNOSIS — E119 Type 2 diabetes mellitus without complications: Secondary | ICD-10-CM | POA: Diagnosis not present

## 2017-08-17 DIAGNOSIS — Z8249 Family history of ischemic heart disease and other diseases of the circulatory system: Secondary | ICD-10-CM | POA: Insufficient documentation

## 2017-08-17 DIAGNOSIS — Z820 Family history of epilepsy and other diseases of the nervous system: Secondary | ICD-10-CM | POA: Diagnosis not present

## 2017-08-17 DIAGNOSIS — Z90710 Acquired absence of both cervix and uterus: Secondary | ICD-10-CM | POA: Insufficient documentation

## 2017-08-17 DIAGNOSIS — J383 Other diseases of vocal cords: Secondary | ICD-10-CM | POA: Diagnosis not present

## 2017-08-17 HISTORY — PX: DIRECT LARYNGOSCOPY: SHX5326

## 2017-08-17 HISTORY — PX: NASOPHARYNGEAL BIOPSY: SHX6488

## 2017-08-17 LAB — GLUCOSE, CAPILLARY
Glucose-Capillary: 187 mg/dL — ABNORMAL HIGH (ref 65–99)
Glucose-Capillary: 170 mg/dL — ABNORMAL HIGH (ref 65–99)
Glucose-Capillary: 157 mg/dL — ABNORMAL HIGH (ref 65–99)

## 2017-08-17 SURGERY — LARYNGOSCOPY, DIRECT
Anesthesia: General | Laterality: Right

## 2017-08-17 MED ORDER — EPINEPHRINE HCL (NASAL) 0.1 % NA SOLN
NASAL | Status: AC
  Filled 2017-08-17: qty 30

## 2017-08-17 MED ORDER — EPINEPHRINE HCL (NASAL) 0.1 % NA SOLN
NASAL | Status: DC | PRN
  Administered 2017-08-17: .3 mL via TOPICAL

## 2017-08-17 MED ORDER — MIDAZOLAM HCL 2 MG/2ML IJ SOLN
INTRAMUSCULAR | Status: AC
  Filled 2017-08-17: qty 2

## 2017-08-17 MED ORDER — LACTATED RINGERS IV SOLN
INTRAVENOUS | Status: DC
  Administered 2017-08-17 (×2): via INTRAVENOUS

## 2017-08-17 MED ORDER — FENTANYL CITRATE (PF) 250 MCG/5ML IJ SOLN
INTRAMUSCULAR | Status: AC
  Filled 2017-08-17: qty 5

## 2017-08-17 MED ORDER — 0.9 % SODIUM CHLORIDE (POUR BTL) OPTIME
TOPICAL | Status: DC | PRN
  Administered 2017-08-17: 1000 mL

## 2017-08-17 MED ORDER — LIDOCAINE HCL (CARDIAC) 20 MG/ML IV SOLN
INTRAVENOUS | Status: DC | PRN
  Administered 2017-08-17: 60 mg via INTRAVENOUS

## 2017-08-17 MED ORDER — PROPOFOL 10 MG/ML IV BOLUS
INTRAVENOUS | Status: DC | PRN
  Administered 2017-08-17: 100 mg via INTRAVENOUS

## 2017-08-17 MED ORDER — FENTANYL CITRATE (PF) 100 MCG/2ML IJ SOLN: 25.0000 ug | INTRAMUSCULAR | Status: DC | PRN

## 2017-08-17 MED ORDER — PROPOFOL 10 MG/ML IV BOLUS
INTRAVENOUS | Status: AC
  Filled 2017-08-17: qty 20

## 2017-08-17 MED ORDER — ALBUTEROL SULFATE HFA 108 (90 BASE) MCG/ACT IN AERS
INHALATION_SPRAY | RESPIRATORY_TRACT | Status: DC | PRN
  Administered 2017-08-17: 6 via RESPIRATORY_TRACT

## 2017-08-17 MED ORDER — ONDANSETRON HCL 4 MG/2ML IJ SOLN
INTRAMUSCULAR | Status: AC
  Filled 2017-08-17: qty 2

## 2017-08-17 MED ORDER — DEXAMETHASONE SODIUM PHOSPHATE 10 MG/ML IJ SOLN
INTRAMUSCULAR | Status: DC | PRN
  Administered 2017-08-17: 10 mg via INTRAVENOUS

## 2017-08-17 MED ORDER — ONDANSETRON HCL 4 MG/2ML IJ SOLN
INTRAMUSCULAR | Status: DC | PRN
  Administered 2017-08-17: 4 mg via INTRAVENOUS

## 2017-08-17 MED ORDER — MIDAZOLAM HCL 5 MG/5ML IJ SOLN
INTRAMUSCULAR | Status: DC | PRN
  Administered 2017-08-17: 2 mg via INTRAVENOUS

## 2017-08-17 MED ORDER — ROCURONIUM BROMIDE 100 MG/10ML IV SOLN
INTRAVENOUS | Status: DC | PRN
  Administered 2017-08-17: 30 mg via INTRAVENOUS
  Administered 2017-08-17: 15 mg via INTRAVENOUS

## 2017-08-17 MED ORDER — EPINEPHRINE PF 1 MG/ML IJ SOLN
INTRAMUSCULAR | Status: AC
  Filled 2017-08-17: qty 1

## 2017-08-17 MED ORDER — LIDOCAINE HCL 2 % IJ SOLN
INTRAMUSCULAR | Status: DC | PRN
  Administered 2017-08-17: 2 mL

## 2017-08-17 MED ORDER — ESMOLOL HCL 100 MG/10ML IV SOLN
INTRAVENOUS | Status: DC | PRN
  Administered 2017-08-17 (×3): 20 mg via INTRAVENOUS
  Administered 2017-08-17: 30 mg via INTRAVENOUS

## 2017-08-17 MED ORDER — LIDOCAINE-EPINEPHRINE 2 %-1:100000 IJ SOLN
INTRAMUSCULAR | Status: DC | PRN
  Administered 2017-08-17: 2 mL via INTRADERMAL

## 2017-08-17 MED ORDER — MUPIROCIN 2 % EX OINT
TOPICAL_OINTMENT | CUTANEOUS | Status: AC
  Filled 2017-08-17: qty 22

## 2017-08-17 MED ORDER — SUGAMMADEX SODIUM 200 MG/2ML IV SOLN
INTRAVENOUS | Status: DC | PRN
  Administered 2017-08-17: 200 mg via INTRAVENOUS

## 2017-08-17 MED ORDER — MUPIROCIN 2 % EX OINT: TOPICAL_OINTMENT | CUTANEOUS | 0 refills | Status: DC

## 2017-08-17 MED ORDER — FENTANYL CITRATE (PF) 100 MCG/2ML IJ SOLN
INTRAMUSCULAR | Status: DC | PRN
  Administered 2017-08-17 (×5): 50 ug via INTRAVENOUS

## 2017-08-17 MED ORDER — PHENYLEPHRINE 40 MCG/ML (10ML) SYRINGE FOR IV PUSH (FOR BLOOD PRESSURE SUPPORT)
PREFILLED_SYRINGE | INTRAVENOUS | Status: AC
  Filled 2017-08-17: qty 10

## 2017-08-17 MED ORDER — LIDOCAINE HCL 2 % IJ SOLN
INTRAMUSCULAR | Status: AC
  Filled 2017-08-17: qty 20

## 2017-08-17 MED ORDER — METOPROLOL TARTRATE 5 MG/5ML IV SOLN
INTRAVENOUS | Status: DC | PRN
  Administered 2017-08-17: 1 mg via INTRAVENOUS

## 2017-08-17 SURGICAL SUPPLY — 28 items
BALLN PULM 15 16.5 18X75 (BALLOONS)
BALLOON PULM 15 16.5 18X75 (BALLOONS) IMPLANT
CANISTER SUCT 3000ML PPV (MISCELLANEOUS) ×3 IMPLANT
CONT SPEC 4OZ CLIKSEAL STRL BL (MISCELLANEOUS) IMPLANT
COVER BACK TABLE 60X90IN (DRAPES) ×3 IMPLANT
COVER MAYO STAND STRL (DRAPES) ×3 IMPLANT
DRAPE HALF SHEET 40X57 (DRAPES) ×3 IMPLANT
GAUZE SPONGE 4X4 16PLY XRAY LF (GAUZE/BANDAGES/DRESSINGS) ×3 IMPLANT
GLOVE BIO SURGEON STRL SZ 6.5 (GLOVE) ×3 IMPLANT
GUARD TEETH (MISCELLANEOUS) IMPLANT
KIT BASIN OR (CUSTOM PROCEDURE TRAY) ×3 IMPLANT
KIT ROOM TURNOVER OR (KITS) ×3 IMPLANT
NDL HYPO 25GX1X1/2 BEV (NEEDLE) IMPLANT
NEEDLE HYPO 25GX1X1/2 BEV (NEEDLE) ×3 IMPLANT
NS IRRIG 1000ML POUR BTL (IV SOLUTION) ×3 IMPLANT
PAD ARMBOARD 7.5X6 YLW CONV (MISCELLANEOUS) ×6 IMPLANT
PATTIES SURGICAL .5 X1 (DISPOSABLE) IMPLANT
PATTIES SURGICAL .5X1.5 (GAUZE/BANDAGES/DRESSINGS) ×3 IMPLANT
PUNCH BIOPSY 4MM (MISCELLANEOUS) ×3
PUNCH BIOPSY DISP 4 (MISCELLANEOUS) IMPLANT
SET COLLECT BLD 25X3/4 12 (NEEDLE) ×2 IMPLANT
SOLUTION ANTI FOG 6CC (MISCELLANEOUS) ×1 IMPLANT
SURGILUBE 2OZ TUBE FLIPTOP (MISCELLANEOUS) IMPLANT
SYR CONTROL 10ML LL (SYRINGE) ×1 IMPLANT
SYR TB 1ML LUER SLIP (SYRINGE) ×1 IMPLANT
TOWEL NATURAL 6PK STERILE (DISPOSABLE) ×3 IMPLANT
TUBE CONNECTING 12X1/4 (SUCTIONS) ×3 IMPLANT
WATER STERILE IRR 1000ML POUR (IV SOLUTION) ×3 IMPLANT

## 2017-08-17 NOTE — Op Note (Signed)
DATE OF PROCEDURE:  08/17/2017    PRE-OPERATIVE DIAGNOSIS:  VOCAL CORD LEUKOPLAKIA, RIGHT NASAL LESION    POST-OPERATIVE DIAGNOSIS:  Same    PROCEDURE(S):  Suspension microdirect laryngoscopy with operating microscope Biopsy bilateral vocal cord lesions Biopsy right nasal septal lesion   SURGEON:  Gavin Pound, MD    ASSISTANT(S):  none    ANESTHESIA:  General endotracheal anesthesia      ESTIMATED BLOOD LOSS:  5 mL   SPECIMENS:  Right nasal septal lesion Right vocal cord lesion Left vocal cord lesion    COMPLICATIONS:  None    OPERATIVE FINDINGS:  Easy mask, straightforward intubation exposure. For laryngeal exposure, the anterior commissure was used with one click of head of bed forward. The right vocal cord lesion had improved in appearance since TFL examination in clinic. The left vocal cord lesion was small but did extend into the SLP. No anterior commissure or false fold involvement. No deep LP involvement. No other lesions noted.   The right nasal septal lesion was along the floor of the anterior nasal septum. This appeared to be about 51mm in diameter and was an exophytic, almost papilloma-appearing firm crust. This was well-circumscribed and was removed entirely with a 21mm punch biopsy.      OPERATIVE DETAILS:  After being properly identified in the preoperative holding area, the patient was brought into the operating suite.  She was placed supine on the operating table.  A pre-procedural time-out was performed. Pre-operative steroids were administered.  After induction of general anesthesia, the patient was easily bag-mask ventilated and intubated by anesthesia.  The right nasal lesion was inspected with a nasal speculum with findings as noted above. 2% lidocaine with 1:100,000 epinephrine was injected into the anterior nasal septum. A 65mm punch biopsy was used to incision the lesion entirely. The lesion was grasped with pickups and  dissected away sharply with scissors. Hemostasis was achieved with epinephrine-soaked patties.   Next, the bed was rotated 90 degrees. Eye-tape, and a tooth guard were placed. The patients mouth and oral cavity were carefully inspected. Next, the Dedo laryngoscope was used to inspect the larynx, but the patient's cords too anterior for optimal exposure. The anterior commissure was then utilized.  The patient was then placed in suspension using the fulcrum suspension system secured to the Mayo stand.  The trachea and glottis were examined with a 0-degree telescope and photo documentation obtained. 1:1000 epinephrine was injected bilaterally into the vocal cords. The up cup forceps and curved scissors were used to excise the leukoplakic lesions bilaterally, which were then passed off the field. Hemostasis was achieved with epi-soaked pledgets.   The patient was released from suspension and the vocal folds were sprayed with % lidocaine. The hypopharynx was suctioned free of all accumulated secretions. The patient was then mask ventilated until breathing spontaneously. She was transferred stable to the recovery room having tolerated the procedure well. At the end of the case, the instrument, sponge and needle counts were correct. The patient tolerated the procedure well.

## 2017-08-17 NOTE — H&P (Signed)
The surgical history remains accurate and without interval change. The condition still exists which makes the procedure necessary. The patient and/or family is aware of their condition and has been informed of the risks and benefits of surgery, as well as alternatives. All parties have elected to proceed with surgery.   Surgical plan: SMDL with biopsy bilateral vocal cord lesion, biopsy right nasal lesion   Otolaryngology Return Patient Note  Subjective: [name] is a 42 y.o. 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 She has not noted any voice changes. She has made no changes in her smoking habits. Denies neck masses, ear pain, dysphagia, aspiration, otalgia. Here today with her husband. She continues to have a sore lesion on her right anterior septum along the anterior most part of her septum extending to the floor. This is typically a scab or crust on her nose. It is exquisitely tender to the touch. It has not grown or changed in size. It does not bleed. This is been present for a number of years without any changes. She is never had this biopsied in the past.  Past Medical History:  Diagnosis Date  . Acid reflux  . Diabetes ([place])  . High cholesterol  . Small intestinal bacterial overgrowth   Past Surgical History:  Procedure Laterality Date  . HYSTERECTOMY   Family History  Problem Relation Age of Onset  . Heart disease Mother  . Migraines Brother  . Stroke Maternal Grandmother   Social History   Social History  . Marital status: Married  Spouse name: N/A  . Number of children: N/A  . Years of education: N/A   Occupational History  . Not on file.   Social History Main Topics  . Smoking status: Current Every Day Smoker  . Smokeless tobacco: Never Used  . Alcohol use Not on file  . Drug use: Unknown  . Sexual activity: Not on file   Other Topics Concern  . Not on  file   Social History Narrative  . No narrative on file   No Known Allergies Updated Medication List:   acetaminophen-codeine (TYLENOL #3) 300-30 mg per tablet  Sig - Route: Take 1 tablet by mouth every 4 (four) hours as needed. - Oral  Class: Historical Med  ALBUTEROL SULFATE (PROVENTIL HFA INHL)  Sig - Route: Inhale into the lungs. - Inhalation  Class: Historical Med  atorvastatin (LIPITOR) 10 MG tablet  Sig - Route: Take 10 mg by mouth nightly. - Oral  Class: Historical Med  atorvastatin (LIPITOR) 10 MG tablet  Sig - Route: Take 10 mg by mouth. - Oral  Class: Historical Med  blood sugar diagnostic (ACCU-CHEK AVIVA PLUS TEST STRP) Strp  Sig - Route: by Misc.(Non-Drug; Combo Route) route. - Misc.(Non-Drug; Combo Route)  Class: Historical Med  blood sugar diagnostic (GLUCOSE BLOOD) Strp  Sig - Route: by Misc.(Non-Drug; Combo Route) route. - Misc.(Non-Drug; Combo Route)  Class: Historical Med  clonazePAM (KLONOPIN) 1 MG tablet  Sig: TAKE 1 TABLET BY MOUTH TWICE A DAY  Class: Historical Med  fluticasone-salmeterol (ADVAIR) 250-50 mcg/dose diskus inhaler  Sig - Route: Inhale 1 puff into the lungs 2 times daily. - Inhalation  Class: Historical Med  glucagon, human recombinant, (GLUCAGON) 1 mg Kit injection  Sig - Route: Inject 1 mg into the muscle. - Intramuscular  Class: Historical Med  HYDROcodone-acetaminophen (NORCO) 5-325 mg per tablet  Sig - Route:  Take 1 tablet by mouth every 4 (four) hours as needed. - Oral  Class: Historical Med  ibuprofen (ADVIL,MOTRIN) 800 MG tablet  Sig - Route: Take 800 mg by mouth. - Oral  Class: Historical Med  insulin lispro (HUMALOG KWIKPEN) 100 unit/mL injection  Sig - Route: Inject into the skin. - Subcutaneous  Class: Historical Med  NOVOLOG U-100 INSULIN ASPART 100 unit/mL injection  Sig: INJECT 40 UNITS DAILY PER INSULIN PUMP (MAX DOSAGE)  Class: Historical Med  omeprazole (PRILOSEC) 20 MG capsule  Sig - Route: Take 20 mg by mouth 2  times daily. - Oral  Class: Historical Med  ondansetron (ZOFRAN) 4 MG tablet  Sig - Route: Take 4 mg by mouth. - Oral  Class: Historical Med  PARoxetine (PAXIL) 40 MG tablet  Sig: TAKE 1 TABLET BY MOUTH ONCE DAILY  Class: Historical Med  rifaXImin (XIFAXAN) 550 mg Tab tablet  Sig - Route: Take 550 mg by mouth 2 times daily. - Oral  Class: Historical Med  rOPINIRole (REQUIP) 1 MG tablet  Sig - Route: Take 1 mg by mouth. - Oral  Class: Historical Med  amoxicillin (AMOXIL) 500 MG tablet  Sig - Route: Take 500 mg by mouth. - Oral  Class: Historical Med  ibuprofen (ADVIL,MOTRIN) 800 MG tablet  Sig - Route: Take 800 mg by mouth. - Oral  Class: Historical Med  insulin detemir (LEVEMIR) 100 unit/mL injection  Sig - Route: Inject into the skin nightly. - Subcutaneous  Class: Historical Med  mucositis mouthwash-viscous lidocaine 2% 1:1  Sig - Route: Take 30 mLs by mouth 2 (two) times daily as needed (as needed for sore throat). - Oral  Notes to Pharmacy: Contains 1 part Maalox Extra Strength - 1 part diphenhydramine - 1 part nystatin. Solution is then mixed 1:1 with viscous lidocaine 2%.  omeprazole (PRILOSEC) 20 MG capsule  Sig - Route: Take 20 mg by mouth. - Oral  Class: Historical Med  PARoxetine (PAXIL) 30 MG tablet  Sig - Route: Take 30 mg by mouth daily. - Oral  Class: Historical Med  PROVENTIL HFA 90 mcg/actuation inhaler  Sig: INHALE 2 PUFFS 4 TIMES A DAY  Class: Historical Med  rifaXImin (XIFAXAN) 550 mg Tab tablet  Sig - Route: Take 550 mg by mouth. - Oral  Class: Historical Med  rOPINIRole (REQUIP) 0.5 MG tablet  Sig - Route: Take 0.5 mg by mouth. - Oral  Class: Historical Med    ROS A cmoplete review of systems was conducted and was negative except as stated in the HPI, as well as positive for an occasional productive cough.   Objective:  Vitals:   08/17/17 0829 08/17/17 0843  BP: (!) 135/94 (!) 147/121  Pulse:  (!) 12  Resp:  18  Temp:  98.9 F (37.2 C)   SpO2:  100%    Physical Exam:  General Normocephalic, Awake, Alert  Eyes PERRL, no scleral icterus or conjunctival hemorrhage. EOMI.  Ears Right ear- canal patent, minimal cerumen. TM intact, no significant retractions, no middle ear effusion, normal landmarks.  Left ear- canal patent, minimal cerumen. TM intact, no significant retractions, no middle ear effusion, normal landmarks.  Nose Patent, No polyps or masses seen. There is a small scab which is tender to palpation, measuring approximately 83m along right nasal sill, along septum and floor of nose. Tender to touch. No ulceration. Non-discrete. Appears consistent to nasal dryness, but is very tender to manipulation.  Oral Pharynx No mucosal lesions or tumors seen. S/p  tonsillectomy.  Lymphatics No cervical lymphadenopathy  Endocrine No thyroidmegaly, no thyroid masses palpated  Cardio-vascular No cyanosis, cardiac auscultation - regular rate, no murmur  Pulmonary No audible stridor, Breathing easily with no labor.  Neuro Symmetric facial movement. Tongue protrudes in midline.  Psychiatry Appropriate affect and mood for clinic visit.  Skin No scars or lesions on face or neck.    Assessment:  My impression is that [place] has  1. Vocal cord leukoplakia  2. Lesion of nasal septum  . We discussed risk and benefits of surgery including voice changes, airway compromise, damage to lips teeth or tongue, sore tongue, sore jaw, mild temporary hemoptysis, need for further surgeries, nasal bleeding.    Plan:  1. We discussed smoking cessation 2. To OR for biopsy of bilateral vocal cord leukoplakia lesions and biopsy of right nasal sill lesion.

## 2017-08-17 NOTE — Anesthesia Procedure Notes (Signed)
Procedure Name: Intubation Date/Time: 08/17/2017 10:20 AM Performed by: Inda Coke, CRNA Pre-anesthesia Checklist: Patient identified, Emergency Drugs available, Suction available and Patient being monitored Patient Re-evaluated:Patient Re-evaluated prior to induction Oxygen Delivery Method: Circle System Utilized Preoxygenation: Pre-oxygenation with 100% oxygen Induction Type: IV induction Ventilation: Mask ventilation without difficulty Laryngoscope Size: Mac and 3 Grade View: Grade I Tube type: Oral Tube size: 6.0 mm Number of attempts: 1 Airway Equipment and Method: Stylet and Oral airway Placement Confirmation: ETT inserted through vocal cords under direct vision,  positive ETCO2 and breath sounds checked- equal and bilateral Secured at: 20 cm Tube secured with: Tape Dental Injury: Teeth and Oropharynx as per pre-operative assessment

## 2017-08-17 NOTE — Discharge Instructions (Signed)
Voice rest for 72 hours after surgery. No talking, whispering, or whistling. If you must cough, do so gently.   Take tylenol alternating with ibuprofen as needed for pain.   Drink plenty of water.   You may eat whatever you like, but soft foods may be easier on you.   Sore throat, coughing up a small amount of blood, sore lips/tongue/jaw, body soreness are all normal. Call or go to ER if you develop any difficulty breathing.   If there is any bleeding from nose, apply firm pressure for 15 full minutes. Avoid nose blowing for 3 days. Apply the mupiricin ointment (Rx sent to pharmacy) twice daily to biopsy site.   Absolutely no smoking.   Dr. Henderson Cloud [facility] Provider Office [phone]

## 2017-08-17 NOTE — Anesthesia Preprocedure Evaluation (Addendum)
Anesthesia Evaluation  Patient identified by MRN, date of birth, ID band Patient awake    Reviewed: Allergy & Precautions, H&P , NPO status , Patient's Chart, lab work & pertinent test results  Airway Mallampati: II  TM Distance: >3 FB Neck ROM: Full    Dental no notable dental hx. (+) Teeth Intact, Dental Advisory Given   Pulmonary COPD,  COPD inhaler, Current Smoker,    Pulmonary exam normal breath sounds clear to auscultation       Cardiovascular negative cardio ROS   Rhythm:Regular Rate:Normal     Neuro/Psych Anxiety Depression negative neurological ROS     GI/Hepatic Neg liver ROS, GERD  Medicated and Controlled,  Endo/Other  diabetes, Type 1, Insulin Dependent  Renal/GU negative Renal ROS  negative genitourinary   Musculoskeletal   Abdominal   Peds  Hematology negative hematology ROS (+)   Anesthesia Other Findings   Reproductive/Obstetrics negative OB ROS                            Anesthesia Physical Anesthesia Plan  ASA: III  Anesthesia Plan: General   Post-op Pain Management:    Induction: Intravenous  PONV Risk Score and Plan: 3 and Ondansetron, Dexamethasone and Midazolam  Airway Management Planned: Oral ETT  Additional Equipment:   Intra-op Plan:   Post-operative Plan: Extubation in OR  Informed Consent: I have reviewed the patients History and Physical, chart, labs and discussed the procedure including the risks, benefits and alternatives for the proposed anesthesia with the patient or authorized representative who has indicated his/her understanding and acceptance.   Dental advisory given  Plan Discussed with: CRNA  Anesthesia Plan Comments:         Anesthesia Quick Evaluation

## 2017-08-17 NOTE — Transfer of Care (Signed)
Immediate Anesthesia Transfer of Care Note  Patient: [name]  Procedure(s) Performed: DIRECT LARYNGOSCOPY WITH OPERATING TELESCOPE WITH BIOPSY (N/A ) NASOPHARYNGEAL BIOPSY RIGHT NASAL LESION (Right )  Patient Location: PACU  Anesthesia Type:General  Level of Consciousness: awake, alert , oriented and patient cooperative  Airway & Oxygen Therapy: Patient Spontanous Breathing  Post-op Assessment: Report given to RN and Post -op Vital signs reviewed and stable  Post vital signs: Reviewed and stable  Last Vitals:  Vitals:   08/17/17 0829 08/17/17 0843  BP: (!) 135/94 (!) 147/121  Pulse:  (!) 12  Resp:  18  Temp:  37.2 C  SpO2:  100%    Last Pain:  Vitals:   08/17/17 0843  TempSrc: Oral         Complications: No apparent anesthesia complications

## 2017-08-20 ENCOUNTER — Encounter (HOSPITAL_COMMUNITY): Payer: Self-pay | Admitting: Otolaryngology

## 2017-08-20 NOTE — Anesthesia Postprocedure Evaluation (Signed)
Anesthesia Post Note  Patient: [name]  Procedure(s) Performed: DIRECT LARYNGOSCOPY WITH OPERATING TELESCOPE WITH BIOPSY (N/A ) NASOPHARYNGEAL BIOPSY RIGHT NASAL LESION (Right )     Patient location during evaluation: PACU Anesthesia Type: General Level of consciousness: awake and alert Pain management: pain level controlled Vital Signs Assessment: post-procedure vital signs reviewed and stable Respiratory status: spontaneous breathing, nonlabored ventilation, respiratory function stable and patient connected to nasal cannula oxygen Cardiovascular status: blood pressure returned to baseline and stable Postop Assessment: no apparent nausea or vomiting Anesthetic complications: no    Last Vitals:  Vitals:   08/17/17 1129 08/17/17 1130  BP: 139/88   Pulse: (!) 117   Resp: 18   Temp:  36.8 C  SpO2: 95%     Last Pain:  Vitals:   08/17/17 1130  TempSrc:   PainSc: 0-No pain                 Effie Berkshire

## 2017-09-14 DIAGNOSIS — M4310 Spondylolisthesis, site unspecified: Secondary | ICD-10-CM | POA: Insufficient documentation

## 2017-09-14 DIAGNOSIS — M792 Neuralgia and neuritis, unspecified: Secondary | ICD-10-CM | POA: Insufficient documentation

## 2017-09-17 IMAGING — US US SOFT TISSUE HEAD/NECK
1 series · 14 of 25 positions shown · non-contrast
Comparison: None.

CLINICAL DATA: Thyroid nodules noted on CT

EXAM:
THYROID ULTRASOUND
TECHNIQUE: Ultrasound examination of the thyroid gland and adjacent soft
tissues was performed.

[Series 1: us soft tissue head/neck · 0.07mm/px · 14 of 77 slices shown]
[im 1/77]
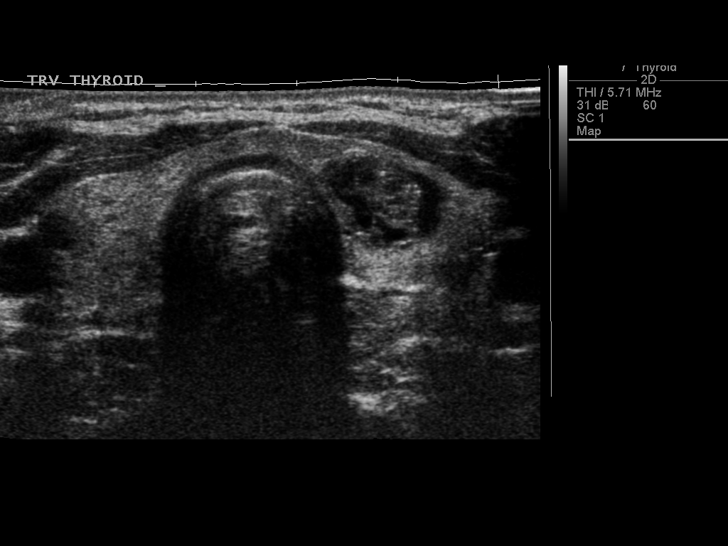
[im 7/77]
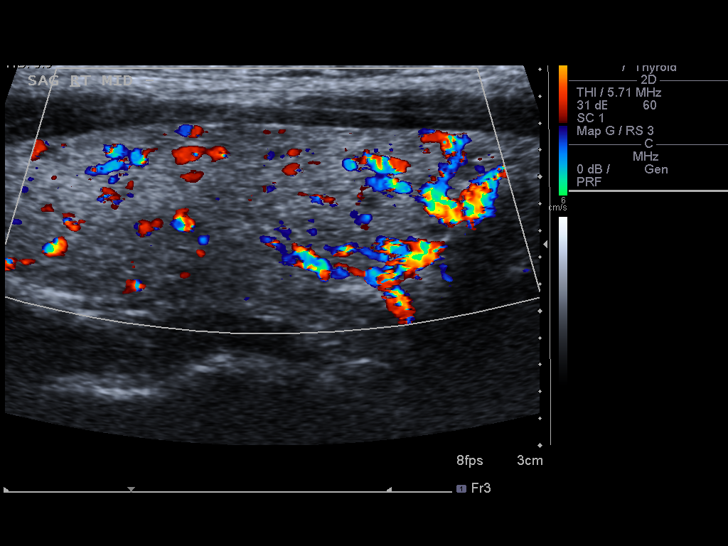
[im 13/77]
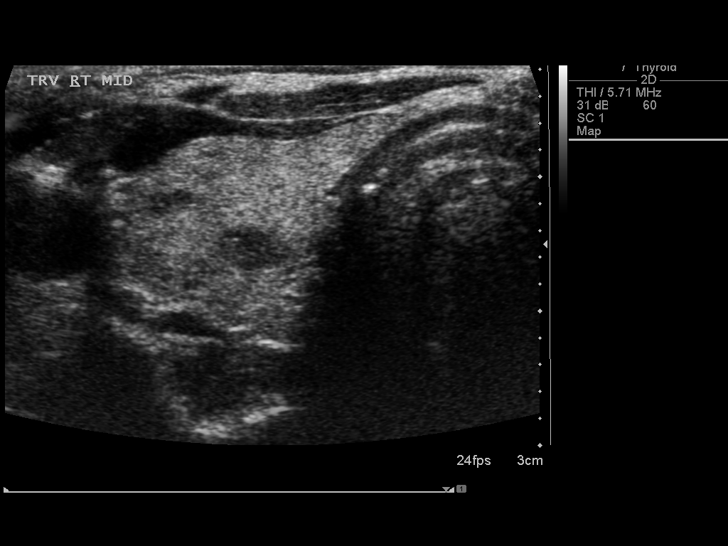
[im 20/77]
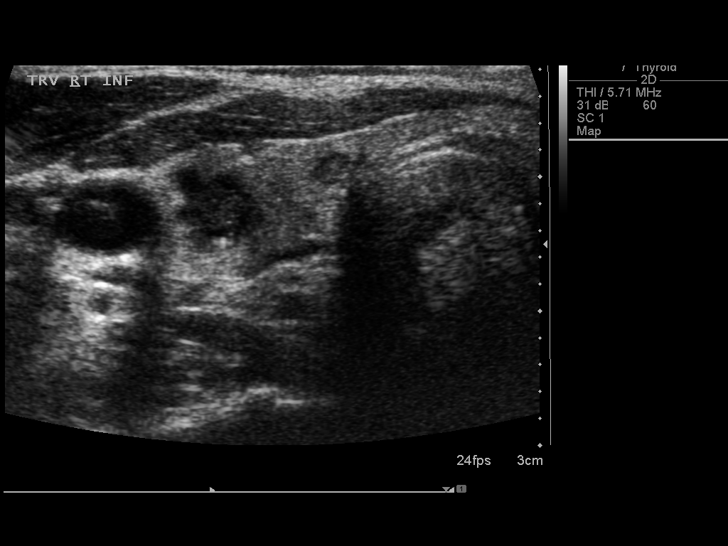
[im 26/77]
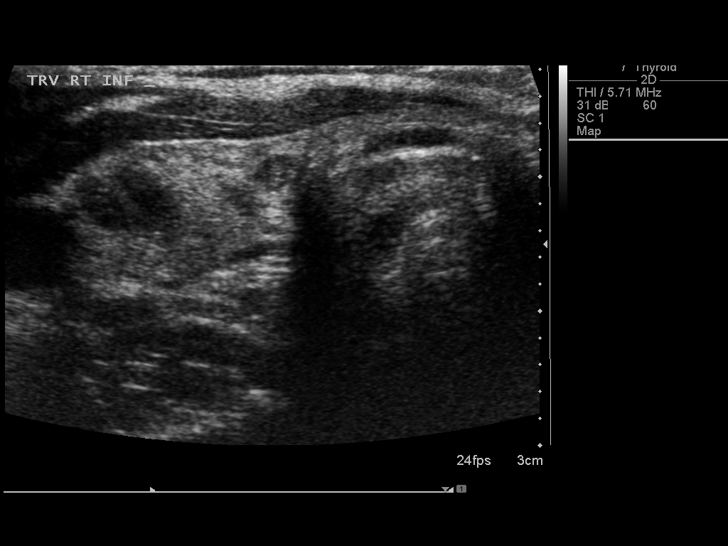
[im 29/77]
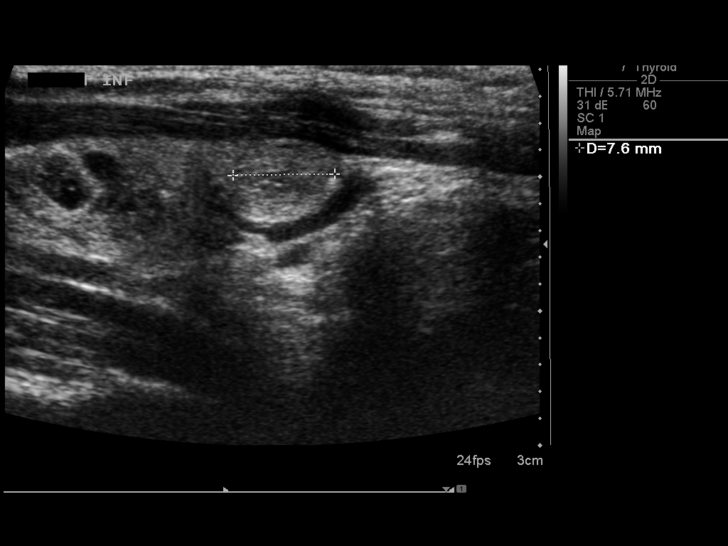
[im 35/77]
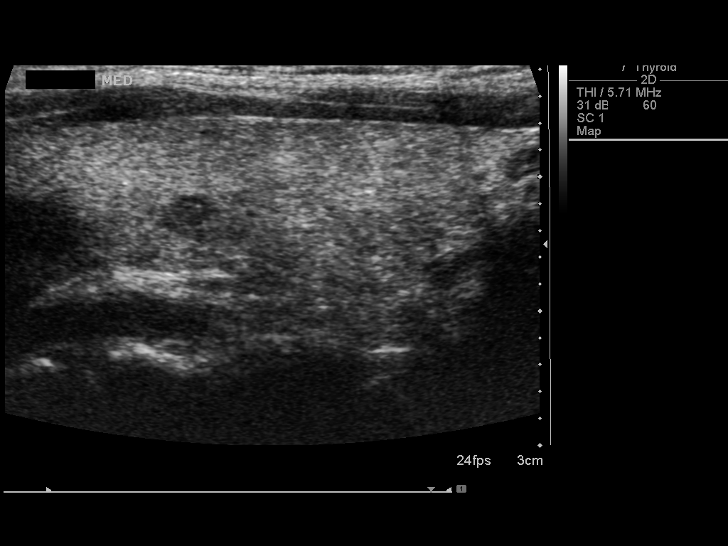
[im 42/77]
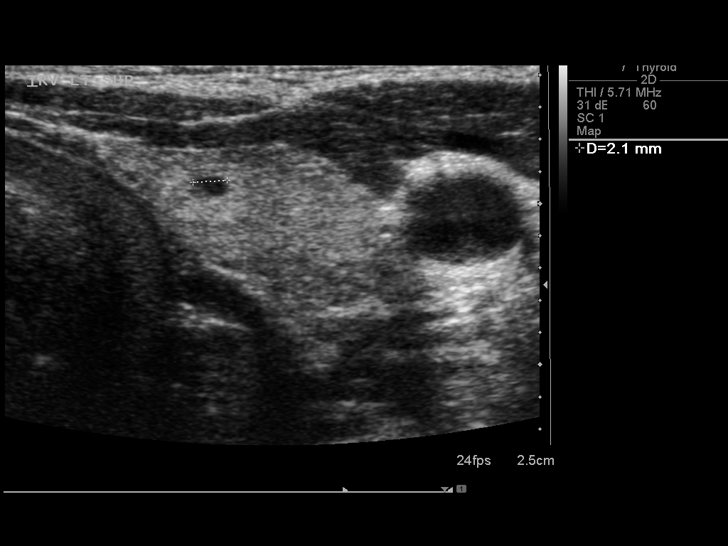
[im 48/77]
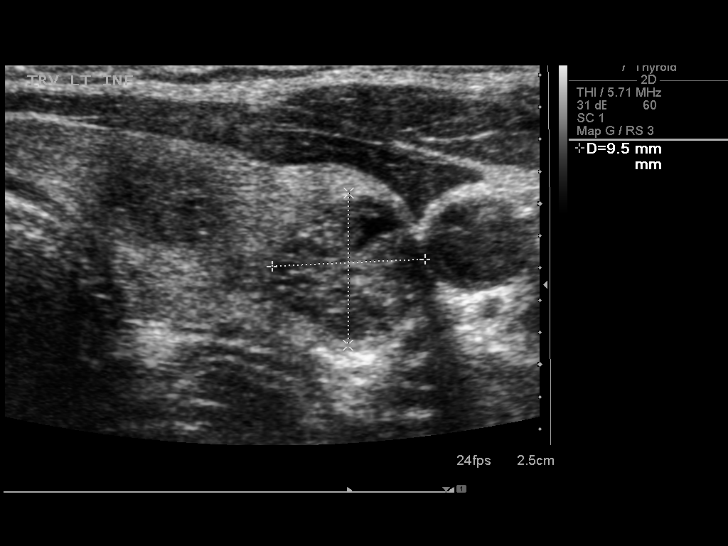
[im 51/77]
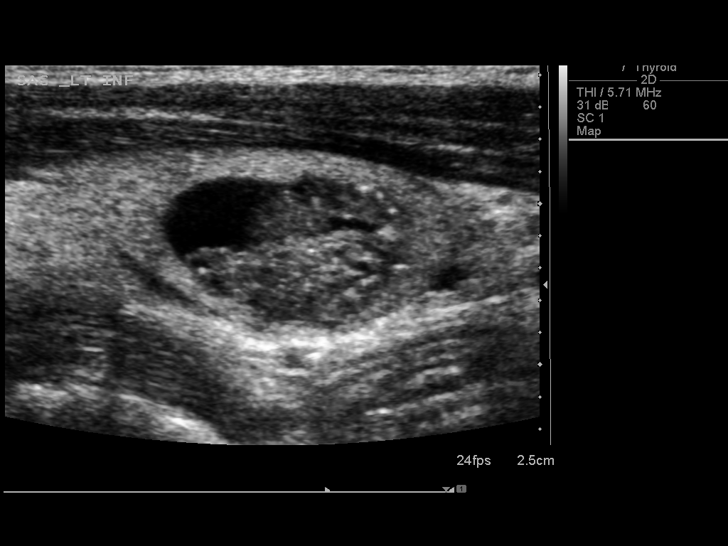
[im 58/77]
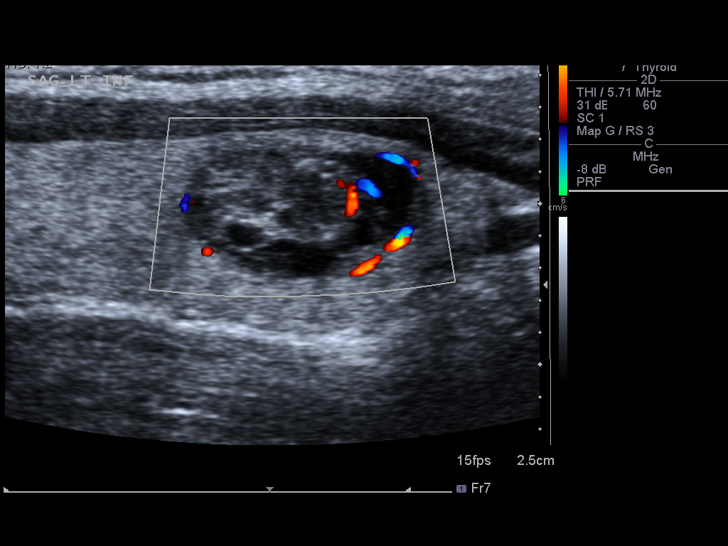
[im 64/77]
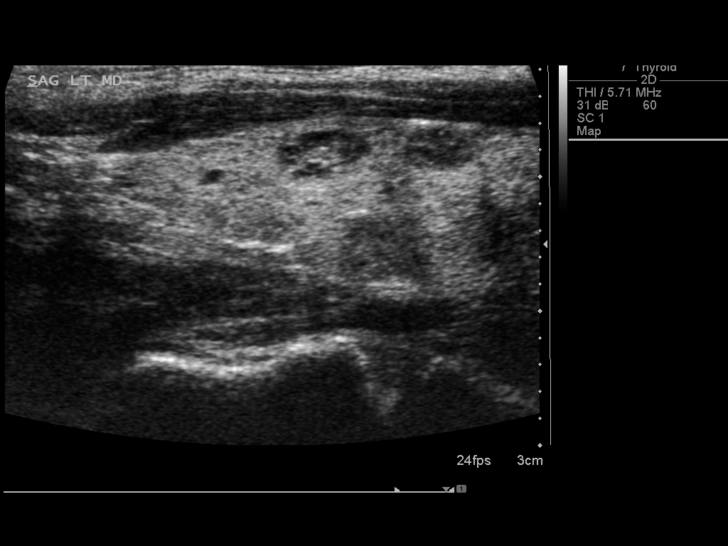
[im 70/77]
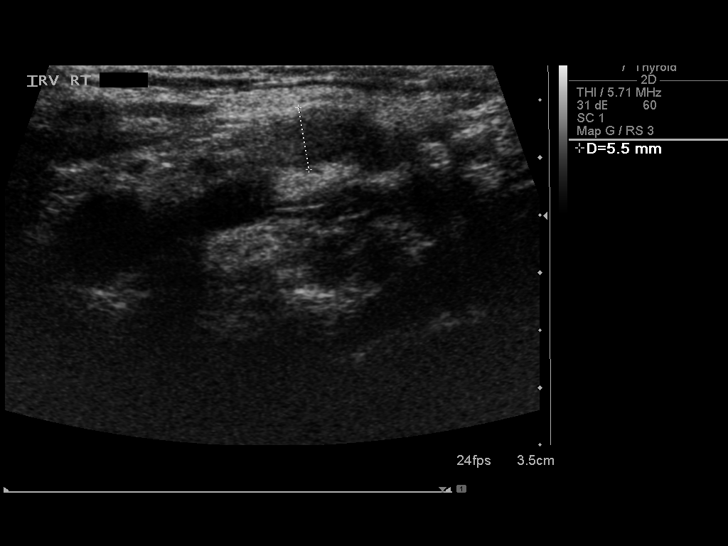
[im 77/77]
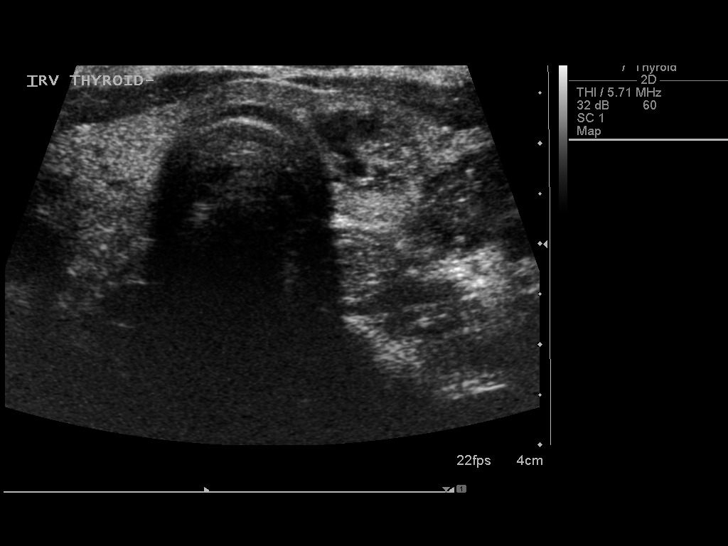

[14 of 25 positions shown; findings below may reference images not displayed]

FINDINGS: Right thyroid lobe

Measurements: 4.3 x 1.3 x 1.6 cm. Solid lower pole nodule measures 9
x 6 x 8 mm. Adjacent complex nodule measures 6 x 12 x 6 mm.

Left thyroid lobe

Measurements: 4.2 x 1.2 x 1.9 cm. Complex lower pole nodule which is
predominately solid measures 16 x 8 x 15 mm. There are internal
calcifications. Similar adjacent nodule measures 10 x 16 x 10 mm.
Subcentimeter mid lobe nodules are noted.

Isthmus

Thickness: 3 mm.  No nodules visualized.

Lymphadenopathy

None visualized.
IMPRESSION: Multiple bilateral thyroid lesions. There are two 16 mm left lower
pole nodules with calcification. Findings meet consensus criteria
for biopsy. Ultrasound-guided fine needle aspiration should be
considered, as per the consensus statement: Management of Thyroid
Nodules Detected at US: Society of Radiologists in Ultrasound

## 2017-09-20 ENCOUNTER — Ambulatory Visit: Payer: Medicaid Other | Admitting: Endocrinology

## 2017-09-20 ENCOUNTER — Encounter: Payer: Self-pay | Admitting: Endocrinology

## 2017-09-20 VITALS — BP 148/78 | HR 117 | Wt 129.0 lb

## 2017-09-20 DIAGNOSIS — E1065 Type 1 diabetes mellitus with hyperglycemia: Secondary | ICD-10-CM

## 2017-09-20 DIAGNOSIS — E1049 Type 1 diabetes mellitus with other diabetic neurological complication: Secondary | ICD-10-CM | POA: Diagnosis not present

## 2017-09-20 DIAGNOSIS — IMO0002 Reserved for concepts with insufficient information to code with codable children: Secondary | ICD-10-CM

## 2017-09-20 LAB — POCT GLYCOSYLATED HEMOGLOBIN (HGB A1C): Hemoglobin A1C: 9.3

## 2017-09-20 NOTE — Patient Instructions (Addendum)
Please take these settings: basal rate of 0.5 units/hr, 10 PM-6 AM, and 1 unit/hr, 6 AM-10 PM.  Suspend for 1-2 hrs for activity.   No mealtime bolus.   correction bolus (which some people call "sensitivity," or "insulin sensitivity ratio," or just "isr") of 1 unit for each 100 by which your glucose exceeds 100.   Please come back for a follow-up appointment in 2 weeks.

## 2017-09-20 NOTE — Progress Notes (Signed)
Subjective:    Patient ID: [name], female    DOB: [birth year], 42 y.o.   MRN: [id number]  HPI Pt returns for f/u of diabetes mellitus: DM type: 1 Dx'ed: 1017 Complications: polyneuropathy and retinopathy. Therapy: insulin since dx, pump rx since 2017 DKA: never Severe hypoglycemia: never Pancreatitis: never Other: therapy has been limited by chronic noncompliance; she has had TAH; She declines continuous glucose monitor.  Interval history: she takes these pump settings: basal rate of 0.4 units/hr, 24 hrs per day.  Suspend for 1-2 hrs for activity.   mealtime bolus of 1 unit/ 18 grams carbohydrate.  correction bolus (which some people call "sensitivity," or "insulin sensitivity ratio," or just "isr") of 1 unit for each 100 by which your glucose exceeds 100.  TDD is approx 30 units/day.   no cbg record, but states cbg's are persistently over 200. Meter is downloaded today, and the printout is scanned into the record.  cbg varies from [phone].  There is no trend throughout the day. Past Medical History:  Diagnosis Date  . Anxiety state, unspecified 03/26/2009  . ASTHMA 02/02/2007  . DEPRESSION 02/16/2009  . DIABETES MELLITUS, TYPE I 02/02/2007  . Endometriosis   . GERD 12/21/2008  . Lesion of vocal cord   . Shortness of breath 09/25/2007  . Vocal fold leukoplakia     Past Surgical History:  Procedure Laterality Date  . ABDOMINAL HYSTERECTOMY  2009   BSO  . DIRECT LARYNGOSCOPY N/A 08/17/2017   Procedure: DIRECT LARYNGOSCOPY WITH OPERATING TELESCOPE WITH BIOPSY;  Surgeon: Helayne Seminole, MD;  Location: [facility];  Service: ENT;  Laterality: N/A;  . NASOPHARYNGEAL BIOPSY Right 08/17/2017   Procedure: NASOPHARYNGEAL BIOPSY RIGHT NASAL LESION;  Surgeon: Helayne Seminole, MD;  Location: [facility];  Service: ENT;  Laterality: Right;    Social History   Socioeconomic History  . Marital status: Legally Separated    Spouse name: Not on file  . Number of children: 2  . Years of  education: Not on file  . Highest education level: Not on file  Social Needs  . Financial resource strain: Not on file  . Food insecurity - worry: Not on file  . Food insecurity - inability: Not on file  . Transportation needs - medical: Not on file  . Transportation needs - non-medical: Not on file  Occupational History    Employer: UNEMPLOYED  Tobacco Use  . Smoking status: Current Every Day Smoker    Packs/day: 1.50    Years: 25.00    Pack years: 37.50    Types: Cigarettes  . Smokeless tobacco: Never Used  . Tobacco comment: form given 06/05/14  Substance and Sexual Activity  . Alcohol use: No    Alcohol/week: 0.0 oz  . Drug use: No  . Sexual activity: Not on file  Other Topics Concern  . Not on file  Social History Narrative   2 children--pt has custody. Husband pays child support.   Daily Caffeine Use-3 2 liters a day   Pt does not get regular exercise.    Current Outpatient Medications on File Prior to Visit  Medication Sig Dispense Refill  . albuterol (PROVENTIL HFA;VENTOLIN HFA) 108 (90 Base) MCG/ACT inhaler Inhale 2 puffs into the lungs every 6 (six) hours as needed for wheezing or shortness of breath. 1 Inhaler 2  . atorvastatin (LIPITOR) 10 MG tablet Take 10 mg by mouth at bedtime.    . Blood Glucose Monitoring Suppl (ACCU-CHEK AVIVA PLUS) W/DEVICE KIT  1 Device by Does not apply route once. 1 kit 0  . cholecalciferol (VITAMIN D) 1000 units tablet Take 1,000 Units by mouth daily.    Marland Kitchen gabapentin (NEURONTIN) 300 MG capsule Take 900 mg by mouth at bedtime.     Marland Kitchen glucagon 1 MG injection Inject 1 mg into the muscle once as needed. 1 each 12  . glucose blood (ACCU-CHEK AVIVA PLUS) test strip USE TO TEST BLOOD SUGAR 7 TIMES DAILY, and lancets 7/day. DX E10.41 250 each 11  . HYDROcodone-acetaminophen (NORCO/VICODIN) 5-325 MG tablet Take 1 tablet by mouth every 4 (four) hours as needed for moderate pain. 20 tablet 0  . ibuprofen (ADVIL,MOTRIN) 200 MG tablet Take 600-800 mg  by mouth every 8 (eight) hours as needed (for pain.).    . Insulin Pen Needle 31G X 8 MM MISC Use as directed     . Multiple Vitamin (MULTIVITAMIN WITH MINERALS) TABS tablet Take 1 tablet by mouth daily.    . mupirocin ointment (BACTROBAN) 2 % Apply to right nasal septum twice daily for 7 days. 22 g 0  . NOVOLOG 100 UNIT/ML injection INJECT 40 UNITS DAILY PER INSULIN PUMP (MAX DOSEAGE) 20 mL 3  . omeprazole (PRILOSEC) 40 MG capsule Take 40 mg by mouth daily before breakfast.  2  . ondansetron (ZOFRAN) 4 MG tablet Take 1 tablet (4 mg total) by mouth every 8 (eight) hours as needed for nausea or vomiting. 30 tablet 1  . PARoxetine (PAXIL) 40 MG tablet Take 1 tablet (40 mg total) by mouth daily. 30 tablet 0  . rifaximin (XIFAXAN) 550 MG TABS tablet Take 1 tablet (550 mg total) by mouth 3 (three) times daily. 42 tablet 0  . rOPINIRole (REQUIP) 0.5 MG tablet TAKE THREE TABLETS BY MOUTH AT BEDTIME 90 tablet 1  . rOPINIRole (REQUIP) 1 MG tablet Take 1 mg by mouth at bedtime.    . clonazePAM (KLONOPIN) 1 MG tablet Take 1 mg by mouth at bedtime as needed for anxiety.     . cyclobenzaprine (FLEXERIL) 10 MG tablet Take 1 tablet (10 mg total) by mouth 3 (three) times daily as needed for muscle spasms. (Patient not taking: Reported on 09/20/2017) 30 tablet 0  . meloxicam (MOBIC) 15 MG tablet Take 1 tablet (15 mg total) by mouth daily. (Patient not taking: Reported on 09/20/2017) 30 tablet 0   No current facility-administered medications on file prior to visit.     No Known Allergies  Family History  Problem Relation Age of Onset  . Diabetes Mother   . Diabetes Maternal Aunt   . Emphysema Maternal Aunt   . Emphysema Maternal Uncle   . Diabetes Maternal Grandfather   . Lung cancer Maternal Aunt   . Colon cancer Neg Hx   . Kidney disease Neg Hx   . Liver disease Neg Hx     BP (!) 148/78 (BP Location: Left Arm, Patient Position: Sitting, Cuff Size: Normal)   Pulse (!) 117   Wt 129 lb (58.5 kg)   SpO2  96%   BMI 21.30 kg/m   Review of Systems She denies hypoglycemia.      Objective:   Physical Exam VITAL SIGNS:  See vs page GENERAL: no distress Pulses: dorsalis pedis intact bilat.   MSK: no deformity of the feet CV: no leg edema Skin:  no ulcer on the feet.  normal color and temp on the feet. Neuro: sensation is intact to touch on the feet.   Lab Results  Component  Value Date   HGBA1C 9.3 09/20/2017      Assessment & Plan:  Type 1 DM, with DR: worse Noncompliance with cbg recording and f/u ov's: plan will be to simplify pump settings   Patient Instructions  Please take these settings: basal rate of 0.5 units/hr, 10 PM-6 AM, and 1 unit/hr, 6 AM-10 PM.  Suspend for 1-2 hrs for activity.   No mealtime bolus.   correction bolus (which some people call "sensitivity," or "insulin sensitivity ratio," or just "isr") of 1 unit for each 100 by which your glucose exceeds 100.   Please come back for a follow-up appointment in 2 weeks.

## 2017-10-08 ENCOUNTER — Ambulatory Visit: Payer: Medicaid Other | Admitting: Endocrinology

## 2017-10-09 ENCOUNTER — Encounter: Payer: Self-pay | Admitting: Emergency Medicine

## 2017-10-09 ENCOUNTER — Other Ambulatory Visit: Payer: Self-pay

## 2017-10-09 DIAGNOSIS — R42 Dizziness and giddiness: Secondary | ICD-10-CM | POA: Diagnosis present

## 2017-10-09 DIAGNOSIS — Z79899 Other long term (current) drug therapy: Secondary | ICD-10-CM | POA: Insufficient documentation

## 2017-10-09 DIAGNOSIS — F17210 Nicotine dependence, cigarettes, uncomplicated: Secondary | ICD-10-CM | POA: Insufficient documentation

## 2017-10-09 DIAGNOSIS — Z794 Long term (current) use of insulin: Secondary | ICD-10-CM | POA: Diagnosis not present

## 2017-10-09 DIAGNOSIS — E109 Type 1 diabetes mellitus without complications: Secondary | ICD-10-CM | POA: Insufficient documentation

## 2017-10-09 DIAGNOSIS — H81392 Other peripheral vertigo, left ear: Secondary | ICD-10-CM | POA: Diagnosis not present

## 2017-10-09 DIAGNOSIS — R110 Nausea: Secondary | ICD-10-CM | POA: Insufficient documentation

## 2017-10-09 DIAGNOSIS — J449 Chronic obstructive pulmonary disease, unspecified: Secondary | ICD-10-CM | POA: Diagnosis not present

## 2017-10-09 LAB — CBC
HEMATOCRIT: 39.5 % (ref 35.0–47.0)
HEMOGLOBIN: 13.8 g/dL (ref 12.0–16.0)
MCH: 31.1 pg (ref 26.0–34.0)
MCHC: 34.9 g/dL (ref 32.0–36.0)
MCV: 89.2 fL (ref 80.0–100.0)
Platelets: 206 10*3/uL (ref 150–440)
RBC: 4.42 MIL/uL (ref 3.80–5.20)
RDW: 12.7 % (ref 11.5–14.5)
WBC: 7.2 10*3/uL (ref 3.6–11.0)

## 2017-10-09 LAB — BASIC METABOLIC PANEL
ANION GAP: 10 (ref 5–15)
BUN: 8 mg/dL (ref 6–20)
CALCIUM: 9.4 mg/dL (ref 8.9–10.3)
CO2: 25 mmol/L (ref 22–32)
Chloride: 94 mmol/L — ABNORMAL LOW (ref 101–111)
Creatinine, Ser: 0.59 mg/dL (ref 0.44–1.00)
Glucose, Bld: 186 mg/dL — ABNORMAL HIGH (ref 65–99)
POTASSIUM: 3.8 mmol/L (ref 3.5–5.1)
SODIUM: 129 mmol/L — AB (ref 135–145)

## 2017-10-09 LAB — GLUCOSE, CAPILLARY: Glucose-Capillary: 152 mg/dL — ABNORMAL HIGH (ref 65–99)

## 2017-10-09 NOTE — ED Triage Notes (Addendum)
Pt to ed with c/o dizziness that started after lunch today.  Pt with elevated BP at 207/106 but pt states this is abnormal for her.  Pt denies chest pain but states she does have a headache and pressure in her head. Pt also reports she is having some mild nausea. Denies vomiting. No slurred speech noted, no facila drooping noted, no unilateral weakness noted. No aphasia noted. Pt alert but tearful at triage, appears anxious.  Hx of insulin dependent diabetes.

## 2017-10-10 ENCOUNTER — Emergency Department
Admission: EM | Admit: 2017-10-10 | Discharge: 2017-10-10 | Disposition: A | Discharge status: Home / Self Care | Payer: Medicaid Other | Attending: Emergency Medicine | Admitting: Emergency Medicine

## 2017-10-10 DIAGNOSIS — H81392 Other peripheral vertigo, left ear: Secondary | ICD-10-CM

## 2017-10-10 MED ORDER — MECLIZINE HCL 25 MG PO TABS: 25.0000 mg | ORAL_TABLET | Freq: Three times a day (TID) | ORAL | 0 refills | Status: DC | PRN

## 2017-10-10 MED ORDER — MECLIZINE HCL 25 MG PO TABS
25.0000 mg | ORAL_TABLET | Freq: Once | ORAL | Status: AC
  Administered 2017-10-10: 25 mg via ORAL
  Filled 2017-10-10: qty 1

## 2017-10-10 NOTE — Discharge Instructions (Signed)
Please take your vertigo medication as needed for severe symptoms and follow-up with your primary care physician as needed.  Return to the emergency department for any concerns whatsoever.  It was a pleasure to take care of you today, and thank you for coming to our emergency department.  If you have any questions or concerns before leaving please ask the nurse to grab me and I'm more than happy to go through your aftercare instructions again.  If you were prescribed any opioid pain medication today such as Norco, Vicodin, Percocet, morphine, hydrocodone, or oxycodone please make sure you do not drive when you are taking this medication as it can alter your ability to drive safely.  If you have any concerns once you are home that you are not improving or are in fact getting worse before you can make it to your follow-up appointment, please do not hesitate to call 911 and come back for further evaluation.  Darel Hong, MD  Results for orders placed or performed during the hospital encounter of 37/10/62  Basic metabolic panel  Result Value Ref Range   Sodium 129 (L) 135 - 145 mmol/L   Potassium 3.8 3.5 - 5.1 mmol/L   Chloride 94 (L) 101 - 111 mmol/L   CO2 25 22 - 32 mmol/L   Glucose, Bld 186 (H) 65 - 99 mg/dL   BUN 8 6 - 20 mg/dL   Creatinine, Ser 0.59 0.44 - 1.00 mg/dL   Calcium 9.4 8.9 - 10.3 mg/dL   GFR calc non Af Amer >60 >60 mL/min   GFR calc Af Amer >60 >60 mL/min   Anion gap 10 5 - 15  CBC  Result Value Ref Range   WBC 7.2 3.6 - 11.0 K/uL   RBC 4.42 3.80 - 5.20 MIL/uL   Hemoglobin 13.8 12.0 - 16.0 g/dL   HCT 39.5 35.0 - 47.0 %   MCV 89.2 80.0 - 100.0 fL   MCH 31.1 26.0 - 34.0 pg   MCHC 34.9 32.0 - 36.0 g/dL   RDW 12.7 11.5 - 14.5 %   Platelets 206 150 - 440 K/uL  Glucose, capillary  Result Value Ref Range   Glucose-Capillary 152 (H) 65 - 99 mg/dL

## 2017-10-10 NOTE — ED Provider Notes (Signed)
[facility] Emergency Department Provider Note  ____________________________________________   First MD Initiated Contact with Patient 10/10/17 0009     (approximate)  I have reviewed the triage vital signs and the nursing notes.   HISTORY  Chief Complaint No chief complaint on file.   HPI [name] is a 42 y.o. female who self presents to the emergency department with moderate severity sudden onset spinning vertigo that began roughly at noon.  She has had intermittent episodes for the past several hours.  Episodes last somewhere between 5 and 10 seconds.  They seem to be somewhat worse when looking to the left and somewhat improved with rest.  Associated with nausea but no vomiting.  No double vision or blurred vision.  No chest pain shortness of breath abdominal pain.  She is concerned that she may be having a stroke.  She denies numbness or weakness.  She does have a history of diabetes mellitus and is concerned because normally her blood sugars very high but today it was quite normal.  Past Medical History:  Diagnosis Date  . Anxiety state, unspecified 03/26/2009  . ASTHMA 02/02/2007  . DEPRESSION 02/16/2009  . DIABETES MELLITUS, TYPE I 02/02/2007  . Endometriosis   . GERD 12/21/2008  . Lesion of vocal cord   . Shortness of breath 09/25/2007  . Vocal fold leukoplakia     Patient Active Problem List   Diagnosis Date Noted  . Multinodular goiter 07/29/2015  . Type 1 diabetes mellitus with neurological manifestations, uncontrolled ([place]) 01/27/2014  . Restless leg 11/07/2013  . Current tobacco use 11/07/2013  . Triggering of digit 08/13/2013  . Hematuria 03/26/2012  . Chronic obstructive pulmonary disease ([place]) 02/15/2012  . CN (constipation) 01/01/2012  . H/O respiratory system disease 10/04/2011  . Cholesteatoma 09/11/2011  . Allergy to environmental factors 09/11/2011  . Anxiety 05/01/2011  . Myalgia 11/10/2010  . AIRWAY INFLAMMATION  06/08/2010  . Nonspecific (abnormal) findings on radiological and other examination of body structure 05/12/2010  . [place] LUNG FIELD 05/12/2010  . Type 1 diabetes mellitus ([place]) 04/01/2010  . Acid reflux 04/01/2010  . Absence of bladder continence 04/01/2010  . VULVAR ABSCESS 03/18/2010  . UTI 03/09/2010  . INSOMNIA 02/18/2010  . FOLLICULITIS 65/79/0383  . ANXIETY STATE, UNSPECIFIED 03/26/2009  . NAUSEA 02/19/2009  . DEPRESSION 02/16/2009  . GERD 12/21/2008  . ASTHMATIC BRONCHITIS, ACUTE 08/12/2008  . ACUTE MAXILLARY SINUSITIS 05/04/2008  . MOLE 01/29/2008  . Diarrhea 10/16/2007  . SMOKER 09/25/2007  . SHORTNESS OF BREATH 09/25/2007  . ASTHMA 02/02/2007    Past Surgical History:  Procedure Laterality Date  . ABDOMINAL HYSTERECTOMY  2009   BSO  . DIRECT LARYNGOSCOPY N/A 08/17/2017   Procedure: DIRECT LARYNGOSCOPY WITH OPERATING TELESCOPE WITH BIOPSY;  Surgeon: Helayne Seminole, MD;  Location: [facility];  Service: ENT;  Laterality: N/A;  . NASOPHARYNGEAL BIOPSY Right 08/17/2017   Procedure: NASOPHARYNGEAL BIOPSY RIGHT NASAL LESION;  Surgeon: Helayne Seminole, MD;  Location: [facility];  Service: ENT;  Laterality: Right;    Prior to Admission medications   Medication Sig Start Date End Date Taking? Authorizing Provider  albuterol (PROVENTIL HFA;VENTOLIN HFA) 108 (90 Base) MCG/ACT inhaler Inhale 2 puffs into the lungs every 6 (six) hours as needed for wheezing or shortness of breath. 05/12/16   Earleen [place], MD  atorvastatin (LIPITOR) 10 MG tablet Take 10 mg by mouth at bedtime.    [provider]  Blood Glucose Monitoring Suppl (ACCU-CHEK AVIVA PLUS) W/DEVICE KIT 1 Device by Does not apply route once. 01/27/14   Renato Shin, MD  cholecalciferol (VITAMIN D) 1000 units tablet Take 1,000 Units by mouth daily.    [provider]  clonazePAM (KLONOPIN) 1 MG tablet Take 1 mg by mouth at bedtime as needed for anxiety.     [provider]  cyclobenzaprine (FLEXERIL) 10 MG tablet Take 1 tablet (10 mg total) by mouth 3 (three) times daily as needed for muscle spasms. Patient not taking: Reported on 09/20/2017 08/11/17   Sherrie George B, FNP  gabapentin (NEURONTIN) 300 MG capsule Take 900 mg by mouth at bedtime.  12/03/13   [provider]  glucagon 1 MG injection Inject 1 mg into the muscle once as needed. 07/29/15   Renato Shin, MD  glucose blood (ACCU-CHEK AVIVA PLUS) test strip USE TO TEST BLOOD SUGAR 7 TIMES DAILY, and lancets 7/day. DX E10.41 07/29/15   Renato Shin, MD  HYDROcodone-acetaminophen (NORCO/VICODIN) 5-325 MG tablet Take 1 tablet by mouth every 4 (four) hours as needed for moderate pain. 08/11/17 08/11/18  Triplett, Johnette Abraham B, FNP  ibuprofen (ADVIL,MOTRIN) 200 MG tablet Take 600-800 mg by mouth every 8 (eight) hours as needed (for pain.).    [provider]  Insulin Pen Needle 31G X 8 MM MISC Use as directed     [provider]  meclizine (ANTIVERT) 25 MG tablet Take 1 tablet (25 mg total) by mouth 3 (three) times daily as needed for dizziness. 10/10/17   Darel Hong, MD  meloxicam (MOBIC) 15 MG tablet Take 1 tablet (15 mg total) by mouth daily. Patient not taking: Reported on 09/20/2017 08/11/17   Sherrie George B, FNP  Multiple Vitamin (MULTIVITAMIN WITH MINERALS) TABS tablet Take 1 tablet by mouth daily.    [provider]  mupirocin ointment (BACTROBAN) 2 % Apply to right nasal septum twice daily for 7 days. 08/17/17 08/17/18  Marcellino, San Jetty, MD  NOVOLOG 100 UNIT/ML injection INJECT 40 UNITS DAILY PER INSULIN PUMP (MAX DOSEAGE) 01/22/17   Renato Shin, MD  omeprazole (PRILOSEC) 40 MG capsule Take 40 mg by mouth daily before breakfast. 08/05/17   [provider]  ondansetron (ZOFRAN) 4 MG tablet Take 1 tablet (4 mg total) by mouth every 8 (eight) hours as needed for nausea or vomiting. 06/19/17   Yetta Flock, MD  PARoxetine (PAXIL) 40 MG tablet Take 1  tablet (40 mg total) by mouth daily. 11/10/13   Renato Shin, MD  rifaximin (XIFAXAN) 550 MG TABS tablet Take 1 tablet (550 mg total) by mouth 3 (three) times daily. 07/13/17   Armbruster, Carlota Raspberry, MD  rOPINIRole (REQUIP) 0.5 MG tablet TAKE THREE TABLETS BY MOUTH AT BEDTIME 08/20/13   Renato Shin, MD  rOPINIRole (REQUIP) 1 MG tablet Take 1 mg by mouth at bedtime.    [provider]    Allergies Patient has no known allergies.  Family History  Problem Relation Age of Onset  . Diabetes Mother   . Diabetes Maternal Aunt   . Emphysema Maternal Aunt   . Emphysema Maternal Uncle   . Diabetes Maternal Grandfather   . Lung cancer Maternal Aunt   . Colon cancer Neg Hx   . Kidney disease Neg Hx   . Liver disease Neg Hx     Social History Social History   Tobacco Use  . Smoking status: Current Every Day Smoker    Packs/day: 1.50    Years:  25.00    Pack years: 37.50    Types: Cigarettes  . Smokeless tobacco: Never Used  . Tobacco comment: form given 06/05/14  Substance Use Topics  . Alcohol use: No    Alcohol/week: 0.0 oz  . Drug use: No    Review of Systems Constitutional: No fever/chills Eyes: No visual changes. ENT: No sore throat. Cardiovascular: Denies chest pain. Respiratory: Denies shortness of breath. Gastrointestinal: No abdominal pain.  Positive for nausea, no vomiting.  No diarrhea.  No constipation. Genitourinary: Negative for dysuria. Musculoskeletal: Negative for back pain. Skin: Negative for rash. Neurological: Positive for headache   ____________________________________________   PHYSICAL EXAM:  VITAL SIGNS: ED Triage Vitals  Enc Vitals Group     BP 10/09/17 1719 (!) 207/106     Pulse Rate 10/09/17 1715 (!) 111     Resp 10/09/17 1715 18     Temp 10/09/17 1715 99.4 F (37.4 C)     Temp Source 10/09/17 1715 Oral     SpO2 10/09/17 1719 99 %     Weight 10/09/17 1719 129 lb (58.5 kg)     Height --      Head Circumference --      Peak Flow  --      Pain Score 10/09/17 1719 9     Pain Loc --      Pain Edu? --      Excl. in [place]? --     Constitutional: Alert and oriented x4 somewhat anxious appearing nontoxic no diaphoresis speaks in full clear sentences Eyes: PERRL EOMI. mid range and brisk.  Fatigable lateral nystagmus on leftward gaze  Head: Atraumatic.  Normal tympanic membranes bilaterally Nose: No congestion/rhinnorhea. Mouth/Throat: No trismus Neck: No stridor.   Cardiovascular: Normal rate, regular rhythm. Grossly normal heart sounds.  Good peripheral circulation. Respiratory: Normal respiratory effort.  No retractions. Lungs CTAB and moving good air Gastrointestinal: Soft nontender Musculoskeletal: No lower extremity edema   Neurologic:  Normal speech and language.  No pronator drift 5 out of 5 grips biceps triceps hip flexion hip extension plantar flexion dorsiflexion Normal finger-nose-finger no dysdiadochokinesis Skin:  Skin is warm, dry and intact. No rash noted. Psychiatric: Mood and affect are normal. Speech and behavior are normal.    ____________________________________________   DIFFERENTIAL includes but not limited to  Peripheral vertigo, central vertigo, dehydration, DKA ____________________________________________   LABS (all labs ordered are listed, but only abnormal results are displayed)  Labs Reviewed  BASIC METABOLIC PANEL - Abnormal; Notable for the following components:      Result Value   Sodium 129 (*)    Chloride 94 (*)    Glucose, Bld 186 (*)    All other components within normal limits  GLUCOSE, CAPILLARY - Abnormal; Notable for the following components:   Glucose-Capillary 152 (*)    All other components within normal limits  CBC  URINALYSIS, COMPLETE (UACMP) WITH MICROSCOPIC  CBG MONITORING, ED    Lab work reviewed by me with slight hyponatremia and slightly elevated glucose but no signs of DKA __________________________________________  EKG  ED ECG REPORT I, Darel Hong, the attending physician, personally viewed and interpreted this ECG.  Date: 10/10/2017 EKG Time:  Rate: 110 Rhythm: Sinus tachycardia QRS Axis: normal Intervals: normal ST/T Wave abnormalities: normal Narrative Interpretation: no evidence of acute ischemia  ____________________________________________  RADIOLOGY   ____________________________________________   PROCEDURES  Procedure(s) performed: no  Procedures  Critical Care performed: no  Observation: no ____________________________________________   INITIAL IMPRESSION / ASSESSMENT  AND PLAN / ED COURSE  Pertinent labs & imaging results that were available during my care of the patient were reviewed by me and considered in my medical decision making (see chart for details).  The patient arrives with fatigable unilateral brief episodes of vertigo.  No other neurological symptoms.  Doubt central etiology and no indication for neuroimaging at this time.  She does feel somewhat improved after meclizine.  I had a lengthy discussion with the patient regarding the diagnostic uncertainty and the importance of primary care follow-up.  Strict return precautions have been given and the patient is discharged home in improved condition.  She verbalizes understanding and agreement with the plan.      ____________________________________________   FINAL CLINICAL IMPRESSION(S) / ED DIAGNOSES  Final diagnoses:  Peripheral vertigo involving left ear      NEW MEDICATIONS STARTED DURING THIS VISIT:  New Prescriptions   MECLIZINE (ANTIVERT) 25 MG TABLET    Take 1 tablet (25 mg total) by mouth 3 (three) times daily as needed for dizziness.     Note:  This document was prepared using Dragon voice recognition software and may include unintentional dictation errors.     Darel Hong, MD 10/10/17 [phone]

## 2017-10-17 ENCOUNTER — Encounter: Payer: Medicaid Other | Attending: Endocrinology | Admitting: Nutrition

## 2017-10-17 DIAGNOSIS — E1065 Type 1 diabetes mellitus with hyperglycemia: Secondary | ICD-10-CM | POA: Diagnosis not present

## 2017-10-17 NOTE — Patient Instructions (Signed)
Call if blood sugars do not come down, or if they drop too low. Test before meals and at bedtime. Call when Dexcom comes, for training.

## 2017-10-17 NOTE — Progress Notes (Signed)
Patient reports that Dr. [name] wants her to make changes to her pump settings, and she does not know how.   Says blood sugars have been running high.  She reports that she is bolusing now for correction boluses, and then giving set doses of insulin for meals.   I explained to her that she will not be doing boluses with these new basal rates.  She did not feel comfortable with this, but agreed to try this.  She was warned to not delay any meals.  She was very hesitant to increase the basal rate up to 1.0u/hr from 6AM to 10PM, but did this just the same.  She had no final questions. She is interested in the Dexcom CGM, so paperwork was filled out for this and faxed in.  She will call me when she get this in.

## 2017-10-24 ENCOUNTER — Ambulatory Visit: Payer: Medicaid Other | Admitting: Endocrinology

## 2017-11-27 ENCOUNTER — Telehealth: Payer: Self-pay | Admitting: Gastroenterology

## 2017-11-27 ENCOUNTER — Telehealth: Payer: Self-pay

## 2017-11-27 MED ORDER — METRONIDAZOLE 500 MG PO TABS: 500.0000 mg | ORAL_TABLET | Freq: Three times a day (TID) | ORAL | 0 refills | Status: DC

## 2017-11-27 NOTE — Telephone Encounter (Signed)
Thanks for the note Jan. She has a diagnosis of SIBO based on prior breath test exam. Why don't we try her on Flagyl as it has been difficult to get Rifaximin in the past. Would try flagyl 500mg  TID for 10 days and see if this helps if no medication interactions. Thanks

## 2017-11-27 NOTE — Telephone Encounter (Signed)
Called and spoke to pt.  She is having a lot of gas and has had about oily stool a day, not really diarrhea.  She said xifaxan didn't help that much in the past but is eager to try something. Symptoms have been going on for several months.  I scheduled her for an appt with Tye Savoy, NP for Friday afternoon.  Please advise if there is anything she can try until Friday's appt.

## 2017-11-27 NOTE — Telephone Encounter (Signed)
Called pt.

## 2017-11-27 NOTE — Telephone Encounter (Signed)
Called pt and advised her of Dr. Doyne Keel recommendation.  She was in agreement to try Flagyl. Sent to [facility] in [place].  She is scheduled to see Nevin Bloodgood on Friday.

## 2017-11-29 ENCOUNTER — Encounter: Payer: Self-pay | Admitting: Neurology

## 2017-11-29 ENCOUNTER — Ambulatory Visit: Payer: Medicaid Other | Admitting: Neurology

## 2017-11-29 ENCOUNTER — Telehealth: Payer: Self-pay | Admitting: Neurology

## 2017-11-29 VITALS — BP 127/77 | HR 88 | Ht 65.5 in | Wt 140.5 lb

## 2017-11-29 DIAGNOSIS — R292 Abnormal reflex: Secondary | ICD-10-CM | POA: Diagnosis not present

## 2017-11-29 DIAGNOSIS — G629 Polyneuropathy, unspecified: Secondary | ICD-10-CM | POA: Insufficient documentation

## 2017-11-29 DIAGNOSIS — M542 Cervicalgia: Secondary | ICD-10-CM

## 2017-11-29 DIAGNOSIS — M5442 Lumbago with sciatica, left side: Secondary | ICD-10-CM | POA: Diagnosis not present

## 2017-11-29 HISTORY — DX: Polyneuropathy, unspecified: G62.9

## 2017-11-29 MED ORDER — DULOXETINE HCL 60 MG PO CPEP: 60.0000 mg | ORAL_CAPSULE | Freq: Every day | ORAL | 12 refills | Status: DC

## 2017-11-29 MED ORDER — ALPRAZOLAM 1 MG PO TABS: 1.0000 mg | ORAL_TABLET | Freq: Every evening | ORAL | 0 refills | Status: DC | PRN

## 2017-11-29 NOTE — Telephone Encounter (Signed)
medicaid order sent to [facility]. They obtain the auth and will reach out to the pt to schedule.  °

## 2017-11-29 NOTE — Telephone Encounter (Signed)
Patient currently on Paxil. She is curious as to what the cymbalta is being prescribed for? Please advise.

## 2017-11-29 NOTE — Progress Notes (Signed)
PATIENT: [name] DOB: 09-Oct-1975  Chief Complaint  Patient presents with  . New Patient (Initial Visit)    PCP: Dr. Tamsen Roers. Referring Provider: Dr. Melina Schools. Patient is alone. Pt reports having pain mostly in her left leg.      HISTORICAL  [name] is a 42 year old female, seen in refer by her primary care physician Dr. Tamsen Roers for evaluation of left leg pain, initial evaluation was on Nov 29, 2017.  I have reviewed and summarized the referring note, she has past medical history of hyperlipidemia, diabetes, type 1 diabetes since she was 42 years old, no use insulin pump,  November 2018, she was lying in her bed, stretching out to opposite direction to reach her nightstand,  she developed worsening low back pain, gradually getting worse, back pain gradually improved, but she had progressively worsening bilateral lower extremity pain  She has baseline mild lower extremity deep achy pain since 2017, was diagnosed with restless legs, she complains of increased achy pain after resting, urged to move, such as sitting down watching TV, or trying to go to sleep, she like to massage move her leg to improve her symptoms,  Now she complains of almost constant 8 out of 10 bilateral lower extremity deep achy pain, worsening low back pain, radiating pain to left leg, she denies gait abnormality, no bowel bladder incontinence, and bilateral fingertips paresthesia, she has been taking gabapentin 300 mg 3 tablets at nighttime, along with Requip 0.5 mg, which has been helpful,   She had laryngeal biopsy under general anesthesia on July 17, 2017, noticed increased bilateral lower extremity discomfort achy pain since the procedure   laboratory evaluation in March 2019, A1c was 9.3, BMP was elevated glucose 186, sodium was decreased 129, normal CBC,  REVIEW OF SYSTEMS: Full 14 system review of systems performed and notable only for: Restless leg, joint pain, joint  swelling, aching muscles  ALLERGIES: No Known Allergies  HOME MEDICATIONS: Current Outpatient Medications  Medication Sig Dispense Refill  . albuterol (PROVENTIL HFA;VENTOLIN HFA) 108 (90 Base) MCG/ACT inhaler Inhale 2 puffs into the lungs every 6 (six) hours as needed for wheezing or shortness of breath. 1 Inhaler 2  . atorvastatin (LIPITOR) 10 MG tablet Take 10 mg by mouth at bedtime.    . Blood Glucose Monitoring Suppl (ACCU-CHEK AVIVA PLUS) W/DEVICE KIT 1 Device by Does not apply route once. 1 kit 0  . cholecalciferol (VITAMIN D) 1000 units tablet Take 1,000 Units by mouth daily.    . clonazePAM (KLONOPIN) 1 MG tablet Take 1 mg by mouth at bedtime as needed for anxiety.     . gabapentin (NEURONTIN) 300 MG capsule Take 900 mg by mouth at bedtime.     Marland Kitchen glucagon 1 MG injection Inject 1 mg into the muscle once as needed. 1 each 12  . glucose blood (ACCU-CHEK AVIVA PLUS) test strip USE TO TEST BLOOD SUGAR 7 TIMES DAILY, and lancets 7/day. DX E10.41 250 each 11  . HYDROcodone-acetaminophen (NORCO/VICODIN) 5-325 MG tablet Take 1 tablet by mouth every 4 (four) hours as needed for moderate pain. 20 tablet 0  . ibuprofen (ADVIL,MOTRIN) 200 MG tablet Take 600-800 mg by mouth every 8 (eight) hours as needed (for pain.).    . Insulin Pen Needle 31G X 8 MM MISC Use as directed     . meclizine (ANTIVERT) 25 MG tablet Take 1 tablet (25 mg total) by mouth 3 (three) times daily as needed for dizziness.  30 tablet 0  . meloxicam (MOBIC) 15 MG tablet Take 1 tablet (15 mg total) by mouth daily. 30 tablet 0  . metroNIDAZOLE (FLAGYL) 500 MG tablet Take 1 tablet (500 mg total) by mouth 3 (three) times daily. 30 tablet 0  . Multiple Vitamin (MULTIVITAMIN WITH MINERALS) TABS tablet Take 1 tablet by mouth daily.    . mupirocin ointment (BACTROBAN) 2 % Apply to right nasal septum twice daily for 7 days. 22 g 0  . NOVOLOG 100 UNIT/ML injection INJECT 40 UNITS DAILY PER INSULIN PUMP (MAX DOSEAGE) 20 mL 3  .  omeprazole (PRILOSEC) 40 MG capsule Take 40 mg by mouth daily before breakfast.  2  . ondansetron (ZOFRAN) 4 MG tablet Take 1 tablet (4 mg total) by mouth every 8 (eight) hours as needed for nausea or vomiting. 30 tablet 1  . PARoxetine (PAXIL) 40 MG tablet Take 1 tablet (40 mg total) by mouth daily. 30 tablet 0  . rifaximin (XIFAXAN) 550 MG TABS tablet Take 1 tablet (550 mg total) by mouth 3 (three) times daily. 42 tablet 0  . rOPINIRole (REQUIP) 0.5 MG tablet TAKE THREE TABLETS BY MOUTH AT BEDTIME 90 tablet 1  . rOPINIRole (REQUIP) 1 MG tablet Take 1 mg by mouth at bedtime.     No current facility-administered medications for this visit.     PAST MEDICAL HISTORY: Past Medical History:  Diagnosis Date  . Anxiety state, unspecified 03/26/2009  . ASTHMA 02/02/2007  . DEPRESSION 02/16/2009  . DIABETES MELLITUS, TYPE I 02/02/2007  . Endometriosis   . GERD 12/21/2008  . Lesion of vocal cord   . Shortness of breath 09/25/2007  . Vocal fold leukoplakia     PAST SURGICAL HISTORY: Past Surgical History:  Procedure Laterality Date  . ABDOMINAL HYSTERECTOMY  2009   BSO  . DIRECT LARYNGOSCOPY N/A 08/17/2017   Procedure: DIRECT LARYNGOSCOPY WITH OPERATING TELESCOPE WITH BIOPSY;  Surgeon: Helayne Seminole, MD;  Location: [facility];  Service: ENT;  Laterality: N/A;  . NASOPHARYNGEAL BIOPSY Right 08/17/2017   Procedure: NASOPHARYNGEAL BIOPSY RIGHT NASAL LESION;  Surgeon: Helayne Seminole, MD;  Location: [facility];  Service: ENT;  Laterality: Right;    FAMILY HISTORY: Family History  Problem Relation Age of Onset  . Diabetes Mother   . Diabetes Maternal Aunt   . Emphysema Maternal Aunt   . Emphysema Maternal Uncle   . Diabetes Maternal Grandfather   . Lung cancer Maternal Aunt   . Colon cancer Neg Hx   . Kidney disease Neg Hx   . Liver disease Neg Hx     SOCIAL HISTORY:  Social History   Socioeconomic History  . Marital status: Legally Separated    Spouse name: Not on file  . Number of  children: 2  . Years of education: Not on file  . Highest education level: Not on file  Occupational History    Employer: UNEMPLOYED  Social Needs  . Financial resource strain: Not on file  . Food insecurity:    Worry: Not on file    Inability: Not on file  . Transportation needs:    Medical: Not on file    Non-medical: Not on file  Tobacco Use  . Smoking status: Current Every Day Smoker    Packs/day: 1.50    Years: 25.00    Pack years: 37.50    Types: Cigarettes  . Smokeless tobacco: Never Used  . Tobacco comment: form given 06/05/14  Substance and Sexual Activity  .  Alcohol use: No    Alcohol/week: 0.0 oz  . Drug use: No  . Sexual activity: Not on file  Lifestyle  . Physical activity:    Days per week: Not on file    Minutes per session: Not on file  . Stress: Not on file  Relationships  . Social connections:    Talks on phone: Not on file    Gets together: Not on file    Attends religious service: Not on file    Active member of club or organization: Not on file    Attends meetings of clubs or organizations: Not on file    Relationship status: Not on file  . Intimate partner violence:    Fear of current or ex partner: Not on file    Emotionally abused: Not on file    Physically abused: Not on file    Forced sexual activity: Not on file  Other Topics Concern  . Not on file  Social History Narrative   2 children--pt has custody. Husband pays child support.   Daily Caffeine Use-3 2 liters a day   Pt does not get regular exercise.     PHYSICAL EXAM   Vitals:   11/29/17 0959  BP: 127/77  Pulse: 88  Weight: 140 lb 8 oz (63.7 kg)  Height: 5' 5.5" (1.664 m)    Not recorded      Body mass index is 23.02 kg/m.  PHYSICAL EXAMNIATION:  Gen: NAD, conversant, well nourised, obese, well groomed                     Cardiovascular: Regular rate rhythm, no peripheral edema, warm, nontender. Eyes: Conjunctivae clear without exudates or hemorrhage Neck:  Supple, no carotid bruits. Pulmonary: Clear to auscultation bilaterally   NEUROLOGICAL EXAM:  MENTAL STATUS: Speech:    Speech is normal; fluent and spontaneous with normal comprehension.  Cognition:     Orientation to time, place and person     Normal recent and remote memory     Normal Attention span and concentration     Normal Language, naming, repeating,spontaneous speech     Fund of knowledge   CRANIAL NERVES: CN II: Visual fields are full to confrontation. Fundoscopic exam is normal with sharp discs and no vascular changes. Pupils are round equal and briskly reactive to light. CN III, IV, VI: extraocular movement are normal. No ptosis. CN V: Facial sensation is intact to pinprick in all 3 divisions bilaterally. Corneal responses are intact.  CN VII: Face is symmetric with normal eye closure and smile. CN VIII: Hearing is normal to rubbing fingers CN IX, X: Palate elevates symmetrically. Phonation is normal. CN XI: Head turning and shoulder shrug are intact CN XII: Tongue is midline with normal movements and no atrophy.  MOTOR: There is no pronator drift of out-stretched arms. Muscle bulk and tone are normal. Muscle strength is normal.  REFLEXES: Reflexes are 2+ and symmetric at the biceps, triceps, 3/3 knees, and ankles. Plantar responses are flexor.  SENSORY: Length dependent decreased to light touch, pinprick at bilateral ankle  COORDINATION: Rapid alternating movements and fine finger movements are intact. There is no dysmetria on finger-to-nose and heel-knee-shin.    GAIT/STANCE: Posture is normal. Gait is steady with normal steps, base, arm swing, and turning. Heel and toe walking are normal. Tandem gait is normal.  Romberg is absent.   DIAGNOSTIC DATA (LABS, IMAGING, TESTING) - I reviewed patient records, labs, notes, testing and imaging myself where  available.   ASSESSMENT AND PLAN  [name] is a 42 y.o. female   History of insulin-dependent  diabetes, Worsening bilateral lower extremity paresthesia, worsening neck, low back pain   hyperreflexia on examinations,  Differentiation diagnosis include peripheral neuropathy, lumbosacral radiculopathy with superimposed cervical myelopathy  EMG nerve conduction study  MRI of cervical and lumbar spine  Laboratory evaluations for potential other etiology for peripheral neuropathy    Marcial Pacas, M.D. Ph.D.  [facility] [address] [place], [place] [address] Ph: [phone] Fax: [phone]  CC: Tamsen Roers, MD

## 2017-11-29 NOTE — Telephone Encounter (Signed)
Pt called stating she is currently on an anti-depressant, so is unclear on why she had been put on DULoxetine (CYMBALTA) 60 MG capsule. Stating she read being on 2 medications can cause a bad reaction please call to discuss

## 2017-11-30 ENCOUNTER — Ambulatory Visit: Payer: Medicaid Other | Admitting: Nurse Practitioner

## 2017-11-30 NOTE — Telephone Encounter (Signed)
Please call patient, Cymbalta was added on for complains of deep muscle achy pain,

## 2017-11-30 NOTE — Telephone Encounter (Signed)
Spoke to patient - she is aware that Dr. Krista Blue is aware she is on Paxil 40mg  daily in addition to the duloxetine 60mg  daily prescribed by her.  The patient is aware of the reason for duloxetine.

## 2017-12-03 ENCOUNTER — Telehealth: Payer: Self-pay | Admitting: *Deleted

## 2017-12-03 DIAGNOSIS — G629 Polyneuropathy, unspecified: Secondary | ICD-10-CM | POA: Diagnosis not present

## 2017-12-03 DIAGNOSIS — E782 Mixed hyperlipidemia: Secondary | ICD-10-CM | POA: Diagnosis not present

## 2017-12-03 DIAGNOSIS — E1065 Type 1 diabetes mellitus with hyperglycemia: Secondary | ICD-10-CM | POA: Diagnosis not present

## 2017-12-03 NOTE — Telephone Encounter (Signed)
Spoke to patient she is aware of results

## 2017-12-03 NOTE — Telephone Encounter (Signed)
-----   Message from Marcial Pacas, MD sent at 12/03/2017  5:06 PM EDT ----- Please call patient for normal laboratory result

## 2017-12-04 LAB — SEDIMENTATION RATE: SED RATE: 3 mm/h (ref 0–32)

## 2017-12-04 LAB — ANA W/REFLEX IF POSITIVE: Anti Nuclear Antibody(ANA): NEGATIVE

## 2017-12-04 LAB — IMMUNOFIXATION ELECTROPHORESIS
IGG (IMMUNOGLOBIN G), SERUM: 746 mg/dL (ref 700–1600)
IgA/Immunoglobulin A, Serum: 125 mg/dL (ref 87–352)
IgM (Immunoglobulin M), Srm: 138 mg/dL (ref 26–217)
TOTAL PROTEIN: 6.6 g/dL (ref 6.0–8.5)

## 2017-12-04 LAB — COPPER, SERUM: COPPER: 112 ug/dL (ref 72–166)

## 2017-12-04 LAB — RPR: RPR: NONREACTIVE

## 2017-12-04 LAB — VITAMIN B12: VITAMIN B 12: 727 pg/mL (ref 232–1245)

## 2017-12-04 LAB — FOLATE: FOLATE: 17 ng/mL (ref >3.0)

## 2017-12-04 LAB — VITAMIN D 25 HYDROXY (VIT D DEFICIENCY, FRACTURES): Vit D, 25-Hydroxy: 38.9 ng/mL (ref 30.0–100.0)

## 2017-12-04 LAB — TSH: TSH: 1.14 u[IU]/mL (ref 0.450–4.500)

## 2017-12-04 LAB — HIV ANTIBODY (ROUTINE TESTING W REFLEX): HIV Screen 4th Generation wRfx: NONREACTIVE

## 2017-12-04 LAB — C-REACTIVE PROTEIN: CRP: 0.4 mg/L (ref 0.0–4.9)

## 2017-12-05 ENCOUNTER — Telehealth: Payer: Self-pay | Admitting: Neurology

## 2017-12-05 NOTE — Telephone Encounter (Signed)
error 

## 2017-12-05 NOTE — Telephone Encounter (Signed)
[name] with [facility] informed me that the MRI Cervical spine has been approved but Medicaid did not approve the MRI Lumbar spine.   A peer to peer is an option the phone number for the peer to peer is [phone] and the case number is [id number]. The patient is scheduled right now to have the exam's done on Saturday at 12/08/17 at [place].

## 2017-12-05 NOTE — Telephone Encounter (Signed)
MRI cervical approved.  MRI lumbar requires peer-to-peer.  This has been scheduled for 12/06/17 at 11:30am.  Dr. Cletis Athens will try Dr. Rhea Belton mobile number first.  If no answer, he will call our office number to complete the case.

## 2017-12-05 NOTE — Telephone Encounter (Addendum)
Ok

## 2017-12-06 ENCOUNTER — Telehealth: Payer: Self-pay | Admitting: Neurology

## 2017-12-06 NOTE — Telephone Encounter (Signed)
Noted, thank you

## 2017-12-06 NOTE — Telephone Encounter (Signed)
[id number] MRI lumbar

## 2017-12-08 ENCOUNTER — Ambulatory Visit
Admission: RE | Admit: 2017-12-08 | Discharge: 2017-12-08 | Disposition: A | Discharge status: Home / Self Care | Payer: Medicaid Other | Source: Ambulatory Visit | Attending: Neurology | Admitting: Neurology

## 2017-12-08 DIAGNOSIS — M5442 Lumbago with sciatica, left side: Secondary | ICD-10-CM | POA: Diagnosis not present

## 2017-12-08 DIAGNOSIS — M542 Cervicalgia: Secondary | ICD-10-CM

## 2017-12-08 DIAGNOSIS — R292 Abnormal reflex: Secondary | ICD-10-CM

## 2017-12-08 DIAGNOSIS — M545 Low back pain: Secondary | ICD-10-CM | POA: Diagnosis not present

## 2017-12-08 DIAGNOSIS — G629 Polyneuropathy, unspecified: Secondary | ICD-10-CM

## 2017-12-17 ENCOUNTER — Emergency Department
Admission: EM | Admit: 2017-12-17 | Discharge: 2017-12-17 | Disposition: A | Discharge status: Home / Self Care | Payer: Medicaid Other | Attending: Emergency Medicine | Admitting: Emergency Medicine

## 2017-12-17 ENCOUNTER — Other Ambulatory Visit: Payer: Self-pay

## 2017-12-17 DIAGNOSIS — F17210 Nicotine dependence, cigarettes, uncomplicated: Secondary | ICD-10-CM | POA: Insufficient documentation

## 2017-12-17 DIAGNOSIS — I10 Essential (primary) hypertension: Secondary | ICD-10-CM | POA: Diagnosis not present

## 2017-12-17 DIAGNOSIS — R42 Dizziness and giddiness: Secondary | ICD-10-CM

## 2017-12-17 DIAGNOSIS — E860 Dehydration: Secondary | ICD-10-CM | POA: Diagnosis not present

## 2017-12-17 DIAGNOSIS — Z794 Long term (current) use of insulin: Secondary | ICD-10-CM | POA: Insufficient documentation

## 2017-12-17 DIAGNOSIS — Z79899 Other long term (current) drug therapy: Secondary | ICD-10-CM | POA: Diagnosis not present

## 2017-12-17 DIAGNOSIS — E871 Hypo-osmolality and hyponatremia: Secondary | ICD-10-CM | POA: Diagnosis not present

## 2017-12-17 DIAGNOSIS — E119 Type 2 diabetes mellitus without complications: Secondary | ICD-10-CM | POA: Insufficient documentation

## 2017-12-17 DIAGNOSIS — R000 Tachycardia, unspecified: Secondary | ICD-10-CM | POA: Diagnosis not present

## 2017-12-17 DIAGNOSIS — R52 Pain, unspecified: Secondary | ICD-10-CM | POA: Diagnosis not present

## 2017-12-17 LAB — TSH: TSH: 1.127 u[IU]/mL (ref 0.350–4.500)

## 2017-12-17 LAB — CBC WITH DIFFERENTIAL/PLATELET
BASOS ABS: 0 10*3/uL (ref 0–0.1)
BASOS PCT: 1 %
Eosinophils Absolute: 0 10*3/uL (ref 0–0.7)
Eosinophils Relative: 0 %
HEMATOCRIT: 42.7 % (ref 35.0–47.0)
Hemoglobin: 15.2 g/dL (ref 12.0–16.0)
Lymphocytes Relative: 14 %
Lymphs Abs: 1.1 10*3/uL (ref 1.0–3.6)
MCH: 32.3 pg (ref 26.0–34.0)
MCHC: 35.6 g/dL (ref 32.0–36.0)
MCV: 90.8 fL (ref 80.0–100.0)
Monocytes Absolute: 0.7 10*3/uL (ref 0.2–0.9)
Monocytes Relative: 9 %
Neutro Abs: 6.1 10*3/uL (ref 1.4–6.5)
Neutrophils Relative %: 76 %
Platelets: 231 10*3/uL (ref 150–440)
RBC: 4.71 MIL/uL (ref 3.80–5.20)
RDW: 12.9 % (ref 11.5–14.5)
WBC: 8 10*3/uL (ref 3.6–11.0)

## 2017-12-17 LAB — BASIC METABOLIC PANEL
ANION GAP: 13 (ref 5–15)
BUN: 7 mg/dL (ref 6–20)
CO2: 23 mmol/L (ref 22–32)
Calcium: 9.8 mg/dL (ref 8.9–10.3)
Chloride: 94 mmol/L — ABNORMAL LOW (ref 101–111)
Creatinine, Ser: 0.62 mg/dL (ref 0.44–1.00)
Glucose, Bld: 229 mg/dL — ABNORMAL HIGH (ref 65–99)
POTASSIUM: 3.5 mmol/L (ref 3.5–5.1)
Sodium: 130 mmol/L — ABNORMAL LOW (ref 135–145)

## 2017-12-17 LAB — TROPONIN I: Troponin I: <0.03 ng/mL (ref <0.03)

## 2017-12-17 MED ORDER — SODIUM CHLORIDE 0.9 % IV BOLUS
1000.0000 mL | Freq: Once | INTRAVENOUS | Status: AC
Start: 2017-12-17 — End: 2017-12-17
  Administered 2017-12-17: 1000 mL via INTRAVENOUS

## 2017-12-17 NOTE — ED Triage Notes (Signed)
Patient states "I was feeling funny like I was going to pass out, weak and dizzy".  Patient reports thought her sugar was low so she checked it and it was "fine".  Reports blood pressure has been high but told by her doctor due to her pain high was normal.  Reports at home her blood pressure was 72/49.

## 2017-12-17 NOTE — ED Notes (Signed)
Patient observed walking outside with steady gait.

## 2017-12-17 NOTE — ED Notes (Signed)
Pt ambulatory with steady gait to stat registration to inquire about wait time; pt updated; verbalized understand

## 2017-12-17 NOTE — ED Notes (Signed)
Pt waiting patiently in [place] with visitor;

## 2017-12-17 NOTE — ED Provider Notes (Signed)
[facility] Emergency Department Provider Note   ____________________________________________   First MD Initiated Contact with Patient 12/17/17 [phone]     (approximate)  I have reviewed the triage vital signs and the nursing notes.   HISTORY  Chief Complaint Hypotension and Dizziness    HPI [name] is a 42 y.o. female who comes into the hospital today with some dizziness and low blood pressure.  The patient states that last night she got up to use the bathroom and went outside to smoke.  She states that she was not feeling well and thought maybe her sugar was low.  She checked her sugar and it was in the 200 but she started feeling really dizzy sweaty and thought she was in a pass out.  She checked her blood pressure and it was 79/42.  She called her mother and was told to come into the hospital.  She does feel improved at this time but has not been feeling well for the past week.  The patient denies any chest pain but did have some shortness of breath at the time.  She states that right before this occurred she did take a pain pill for some hand pain.  She also took her gabapentin and her other neuropathy medications.  She states that she did not eat as much as she normally does today and she drinks a lot of caffeinated drinks.  She is here today for evaluation of her symptoms.   Past Medical History:  Diagnosis Date  . Anxiety state, unspecified 03/26/2009  . ASTHMA 02/02/2007  . DEPRESSION 02/16/2009  . DIABETES MELLITUS, TYPE I 02/02/2007  . Endometriosis   . GERD 12/21/2008  . Lesion of vocal cord   . Shortness of breath 09/25/2007  . Vocal fold leukoplakia     Patient Active Problem List   Diagnosis Date Noted  . Hyperreflexia 11/29/2017  . Low back pain with left-sided sciatica 11/29/2017  . Peripheral polyneuropathy 11/29/2017  . Multinodular goiter 07/29/2015  . Type 1 diabetes mellitus with neurological manifestations, uncontrolled ([place])  01/27/2014  . Restless leg 11/07/2013  . Current tobacco use 11/07/2013  . Triggering of digit 08/13/2013  . Hematuria 03/26/2012  . Chronic obstructive pulmonary disease ([place]) 02/15/2012  . CN (constipation) 01/01/2012  . H/O respiratory system disease 10/04/2011  . Cholesteatoma 09/11/2011  . Allergy to environmental factors 09/11/2011  . Anxiety 05/01/2011  . Myalgia 11/10/2010  . AIRWAY INFLAMMATION 06/08/2010  . Nonspecific (abnormal) findings on radiological and other examination of body structure 05/12/2010  . [place] LUNG FIELD 05/12/2010  . Type 1 diabetes mellitus ([place]) 04/01/2010  . Acid reflux 04/01/2010  . Absence of bladder continence 04/01/2010  . VULVAR ABSCESS 03/18/2010  . UTI 03/09/2010  . INSOMNIA 02/18/2010  . FOLLICULITIS 82/80/0349  . ANXIETY STATE, UNSPECIFIED 03/26/2009  . NAUSEA 02/19/2009  . DEPRESSION 02/16/2009  . GERD 12/21/2008  . ASTHMATIC BRONCHITIS, ACUTE 08/12/2008  . ACUTE MAXILLARY SINUSITIS 05/04/2008  . MOLE 01/29/2008  . Diarrhea 10/16/2007  . SMOKER 09/25/2007  . SHORTNESS OF BREATH 09/25/2007  . ASTHMA 02/02/2007    Past Surgical History:  Procedure Laterality Date  . ABDOMINAL HYSTERECTOMY  2009   BSO  . DIRECT LARYNGOSCOPY N/A 08/17/2017   Procedure: DIRECT LARYNGOSCOPY WITH OPERATING TELESCOPE WITH BIOPSY;  Surgeon: Helayne Seminole, MD;  Location: [facility];  Service: ENT;  Laterality: N/A;  . NASOPHARYNGEAL BIOPSY Right 08/17/2017   Procedure: NASOPHARYNGEAL BIOPSY  RIGHT NASAL LESION;  Surgeon: Helayne Seminole, MD;  Location: [facility];  Service: ENT;  Laterality: Right;    Prior to Admission medications   Medication Sig Start Date End Date Taking? Authorizing Provider  albuterol (PROVENTIL HFA;VENTOLIN HFA) 108 (90 Base) MCG/ACT inhaler Inhale 2 puffs into the lungs every 6 (six) hours as needed for wheezing or shortness of breath. 05/12/16   Earleen Newport, MD  ALPRAZolam Duanne Moron) 1 MG  tablet Take 1 tablet (1 mg total) by mouth at bedtime as needed for anxiety. 11/29/17   Marcial Pacas, MD  atorvastatin (LIPITOR) 10 MG tablet Take 10 mg by mouth at bedtime.    [provider]  Blood Glucose Monitoring Suppl (ACCU-CHEK AVIVA PLUS) W/DEVICE KIT 1 Device by Does not apply route once. 01/27/14   Renato Shin, MD  cholecalciferol (VITAMIN D) 1000 units tablet Take 1,000 Units by mouth daily.    [provider]  clonazePAM (KLONOPIN) 1 MG tablet Take 1 mg by mouth at bedtime as needed for anxiety.     [provider]  DULoxetine (CYMBALTA) 60 MG capsule Take 1 capsule (60 mg total) by mouth daily. 11/29/17   Marcial Pacas, MD  gabapentin (NEURONTIN) 300 MG capsule Take 900 mg by mouth at bedtime.  12/03/13   [provider]  glucagon 1 MG injection Inject 1 mg into the muscle once as needed. 07/29/15   Renato Shin, MD  glucose blood (ACCU-CHEK AVIVA PLUS) test strip USE TO TEST BLOOD SUGAR 7 TIMES DAILY, and lancets 7/day. DX E10.41 07/29/15   Renato Shin, MD  HYDROcodone-acetaminophen (NORCO/VICODIN) 5-325 MG tablet Take 1 tablet by mouth every 4 (four) hours as needed for moderate pain. 08/11/17 08/11/18  Triplett, Johnette Abraham B, FNP  ibuprofen (ADVIL,MOTRIN) 200 MG tablet Take 600-800 mg by mouth every 8 (eight) hours as needed (for pain.).    [provider]  Insulin Pen Needle 31G X 8 MM MISC Use as directed     [provider]  meclizine (ANTIVERT) 25 MG tablet Take 1 tablet (25 mg total) by mouth 3 (three) times daily as needed for dizziness. 10/10/17   Darel Hong, MD  meloxicam (MOBIC) 15 MG tablet Take 1 tablet (15 mg total) by mouth daily. 08/11/17   Triplett, Johnette Abraham B, FNP  metroNIDAZOLE (FLAGYL) 500 MG tablet Take 1 tablet (500 mg total) by mouth 3 (three) times daily. 11/27/17   Armbruster, Carlota Raspberry, MD  Multiple Vitamin (MULTIVITAMIN WITH MINERALS) TABS tablet Take 1 tablet by mouth daily.    [provider]  mupirocin ointment  (BACTROBAN) 2 % Apply to right nasal septum twice daily for 7 days. 08/17/17 08/17/18  Marcellino, San Jetty, MD  NOVOLOG 100 UNIT/ML injection INJECT 40 UNITS DAILY PER INSULIN PUMP (MAX DOSEAGE) 01/22/17   Renato Shin, MD  omeprazole (PRILOSEC) 40 MG capsule Take 40 mg by mouth daily before breakfast. 08/05/17   [provider]  ondansetron (ZOFRAN) 4 MG tablet Take 1 tablet (4 mg total) by mouth every 8 (eight) hours as needed for nausea or vomiting. 06/19/17   Yetta Flock, MD  PARoxetine (PAXIL) 40 MG tablet Take 1 tablet (40 mg total) by mouth daily. 11/10/13   Renato Shin, MD  rifaximin (XIFAXAN) 550 MG TABS tablet Take 1 tablet (550 mg total) by mouth 3 (three) times daily. 07/13/17   Yetta Flock, MD  rOPINIRole (REQUIP) 0.5 MG tablet TAKE THREE TABLETS BY MOUTH AT BEDTIME 08/20/13   Renato Shin, MD  rOPINIRole (REQUIP) 1 MG tablet Take 1 mg by mouth at bedtime.    [provider]    Allergies Patient has no known allergies.  Family History  Problem Relation Age of Onset  . Diabetes Mother   . Diabetes Maternal Aunt   . Emphysema Maternal Aunt   . Emphysema Maternal Uncle   . Diabetes Maternal Grandfather   . Lung cancer Maternal Aunt   . Colon cancer Neg Hx   . Kidney disease Neg Hx   . Liver disease Neg Hx     Social History Social History   Tobacco Use  . Smoking status: Current Every Day Smoker    Packs/day: 1.50    Years: 25.00    Pack years: 37.50    Types: Cigarettes  . Smokeless tobacco: Never Used  . Tobacco comment: form given 06/05/14  Substance Use Topics  . Alcohol use: No    Alcohol/week: 0.0 oz  . Drug use: No    Review of Systems  Constitutional: Sweats Eyes: No visual changes. ENT: No sore throat. Cardiovascular: Denies chest pain. Respiratory: Denies shortness of breath. Gastrointestinal: No abdominal pain.  No nausea, no vomiting.  No diarrhea.  No constipation. Genitourinary: Negative for  dysuria. Musculoskeletal: Negative for back pain. Skin: Negative for rash. Neurological: Dizziness, presyncope   ____________________________________________   PHYSICAL EXAM:  VITAL SIGNS: ED Triage Vitals  Enc Vitals Group     BP 12/17/17 0240 (!) 186/86     Pulse Rate 12/17/17 0240 (!) 108     Resp 12/17/17 0240 20     Temp 12/17/17 0240 98.7 F (37.1 C)     Temp Source 12/17/17 0240 Oral     SpO2 12/17/17 0240 99 %     Weight 12/17/17 0239 130 lb (59 kg)     Height --      Head Circumference --      Peak Flow --      Pain Score 12/17/17 0600 9     Pain Loc --      Pain Edu? --      Excl. in [place]? --     Constitutional: Alert and oriented. Well appearing and in mild distress. Eyes: Conjunctivae are normal. PERRL. EOMI. Head: Atraumatic. Nose: No congestion/rhinnorhea. Mouth/Throat: Mucous membranes are moist.  Oropharynx non-erythematous. Cardiovascular: Normal rate, regular rhythm. Grossly normal heart sounds.  Good peripheral circulation. Respiratory: Normal respiratory effort.  No retractions. Lungs CTAB. Gastrointestinal: Soft and nontender. No distention.  Positive bowel sounds Musculoskeletal: No lower extremity tenderness nor edema.   Neurologic:  Normal speech and language.  Cranial nerves II through XII are grossly intact with no focal motor neuro deficit Skin:  Skin is warm, dry and intact.  Psychiatric: Mood and affect are normal. .  ____________________________________________   LABS (all labs ordered are listed, but only abnormal results are displayed)  Labs Reviewed  BASIC METABOLIC PANEL - Abnormal; Notable for the following components:      Result Value   Sodium 130 (*)    Chloride 94 (*)    Glucose, Bld 229 (*)    All other components within normal limits  CBC WITH DIFFERENTIAL/PLATELET  TROPONIN I  TROPONIN I  TSH   ____________________________________________  EKG  ED ECG REPORT I, Loney Hering, the attending physician,  personally viewed and interpreted this ECG.   Date: 12/17/2017  EKG Time: 238  Rate: 111  Rhythm: sinus tachycardia  Axis: normal  Intervals:none  ST&T Change: none  ____________________________________________  [place]  ED MD interpretation:  none  Official radiology report(s): No results found.  ____________________________________________   PROCEDURES  Procedure(s) performed: None  Procedures  Critical Care performed: No  ____________________________________________   INITIAL IMPRESSION / ASSESSMENT AND PLAN / ED COURSE  As part of my medical decision making, I reviewed the following data within the electronic MEDICAL RECORD NUMBER Notes from prior ED visits and [place] Controlled Substance Database   This is a 42 year old female who comes into the hospital today with some sweats and lightheadedness as well as some low blood pressure at home.  We did check some blood work on the patient and it was unremarkable.  The patient's sodium was 130 and her sugar was 229.  We did check 2 troponins which were negative.  The patient does feel improved.  She will be discharged home and encouraged to follow-up with her primary care physician.      ____________________________________________   FINAL CLINICAL IMPRESSION(S) / ED DIAGNOSES  Final diagnoses:  Dehydration  Dizziness  Hyponatremia     ED Discharge Orders    None       Note:  This document was prepared using Dragon voice recognition software and may include unintentional dictation errors.    Loney Hering, MD 12/17/17 [phone]

## 2017-12-17 NOTE — Discharge Instructions (Addendum)
Please follow up with your primary care physician.

## 2017-12-18 DIAGNOSIS — G629 Polyneuropathy, unspecified: Secondary | ICD-10-CM | POA: Diagnosis not present

## 2017-12-18 DIAGNOSIS — E782 Mixed hyperlipidemia: Secondary | ICD-10-CM | POA: Diagnosis not present

## 2017-12-18 DIAGNOSIS — E1065 Type 1 diabetes mellitus with hyperglycemia: Secondary | ICD-10-CM | POA: Diagnosis not present

## 2018-01-08 DIAGNOSIS — G629 Polyneuropathy, unspecified: Secondary | ICD-10-CM | POA: Diagnosis not present

## 2018-01-08 DIAGNOSIS — E782 Mixed hyperlipidemia: Secondary | ICD-10-CM | POA: Diagnosis not present

## 2018-01-08 DIAGNOSIS — E1065 Type 1 diabetes mellitus with hyperglycemia: Secondary | ICD-10-CM | POA: Diagnosis not present

## 2018-01-11 ENCOUNTER — Ambulatory Visit: Payer: Medicaid Other | Admitting: Neurology

## 2018-01-11 ENCOUNTER — Ambulatory Visit (INDEPENDENT_AMBULATORY_CARE_PROVIDER_SITE_OTHER): Payer: Medicaid Other | Admitting: Neurology

## 2018-01-11 DIAGNOSIS — E1142 Type 2 diabetes mellitus with diabetic polyneuropathy: Secondary | ICD-10-CM | POA: Insufficient documentation

## 2018-01-11 DIAGNOSIS — G629 Polyneuropathy, unspecified: Secondary | ICD-10-CM | POA: Diagnosis not present

## 2018-01-11 DIAGNOSIS — R202 Paresthesia of skin: Secondary | ICD-10-CM | POA: Diagnosis not present

## 2018-01-11 DIAGNOSIS — M5442 Lumbago with sciatica, left side: Secondary | ICD-10-CM

## 2018-01-11 DIAGNOSIS — R292 Abnormal reflex: Secondary | ICD-10-CM

## 2018-01-11 DIAGNOSIS — M542 Cervicalgia: Secondary | ICD-10-CM

## 2018-01-11 NOTE — Procedures (Signed)
Full Name: [name] Gender: Female MRN #: [id number] Date of Birth: [birth year]    Visit Date: 01/11/18 10:29 Age: 42 Years 28 Months Old Examining Physician: Marcial Pacas, MD  Referring Physician: Krista Blue, MD History: 42 years old female, presented with bilateral feet paresthesia, lower extremity deep achy pain, left worse than right, history of diabetes poorly controlled A1c was 9.3  Summary of the tests: Nerve conduction study: Bilateral sural, superficial peroneal sensory responses showed mildly prolonged peak latency, with borderline or mildly decreased to snap amplitude.  Bilateral tibial, peroneal to EDB motor responses were normal.  Electromyography:   Selective needle examination of left lower extremity muscles and left lumbosacral paraspinal muscles is normal.  Conclusion:  This is a mild abnormal study.  There is electrodiagnostic diagnostic evidence of mild length dependent sensorimotor axonal polyneuropathy.  This is most consistent with her poorly controlled diabetes.   ------------------------------- Marcial Pacas, M.D.  [facility] [place], [place] [address] Tel: [phone] Fax: [phone]        [facility]    Nerve / Sites Muscle Latency Ref. Amplitude Ref. Rel Amp Segments Distance Velocity Ref. Area    ms ms mV mV %  cm m/s m/s mVms  R Peroneal - EDB     Ankle EDB 5.8 ?6.5 4.6 ?2.0 100 Ankle - EDB 9   18.1     Fib head EDB 12.4  4.7  101 Fib head - Ankle 29 44 ?44 17.8     Pop fossa EDB 15.1  4.4  94.1 Pop fossa - Fib head 12 44 ?44 17.0         Pop fossa - Ankle      L Peroneal - EDB     Ankle EDB 5.0 ?6.5 7.3 ?2.0 100 Ankle - EDB 9   25.9     Fib head EDB 11.7  6.8  92.4 Fib head - Ankle 29 44 ?44 25.9     Pop fossa EDB 14.4  6.5  96.4 Pop fossa - Fib head 12 44 ?44 24.9         Pop fossa - Ankle      R Tibial - AH     Ankle AH 4.5 ?5.8 11.5 ?4.0 100 Ankle - AH 9   28.9     Pop fossa AH 13.5  8.6  75 Pop fossa -  Ankle 37 41 ?41 25.5  L Tibial - AH     Ankle AH 4.9 ?5.8 12.4 ?4.0 100 Ankle - AH 9   30.3     Pop fossa AH 14.0  9.3  74.9 Pop fossa - Ankle 37 41 ?41 30.5             SNC    Nerve / Sites Rec. Site Peak Lat Ref.  Amp Ref. Segments Distance    ms ms V V  cm  R Sural - Ankle (Calf)     Calf Ankle 4.9 ?4.4 4 ?6 Calf - Ankle 14  L Sural - Ankle (Calf)     Calf Ankle 4.7 ?4.4 6 ?6 Calf - Ankle 14  R Superficial peroneal - Ankle     Lat leg Ankle 4.9 ?4.4 5 ?6 Lat leg - Ankle 14  L Superficial peroneal - Ankle     Lat leg Ankle 4.7 ?4.4 5 ?6 Lat leg - Ankle 14             F  Wave  Nerve F Lat Ref.   ms ms  R Tibial - AH 53.8 ?56.0  L Tibial - AH 53.7 ?56.0         EMG full       EMG Summary Table    Spontaneous MUAP Recruitment  Muscle IA Fib PSW Fasc Other Amp Dur. Poly Pattern  L. Tibialis anterior Normal None None None _______ Normal Normal Normal Normal  L. Tibialis posterior Normal None None None _______ Normal Normal Normal Normal  L. Peroneus longus Normal None None None _______ Normal Normal Normal Normal  L. Vastus lateralis Normal None None None _______ Normal Normal Normal Normal  L. Gastrocnemius (Medial head) Normal None None None _______ Normal Normal Normal Normal  L. Gluteus medius Normal None None None _______ Normal Normal Normal Normal  L. Lumbar paraspinals (mid) Normal None None None _______ Normal Normal Normal Normal  L. Lumbar paraspinals (low) Normal None None None _______ Normal Normal Normal Normal

## 2018-01-11 NOTE — Progress Notes (Signed)
PATIENT: [name] DOB: 08-22-1975  No chief complaint on file.    HISTORICAL  [name] is a 42 year old female, seen in refer by her primary care physician Dr. Tamsen Roers for evaluation of left leg pain, initial evaluation was on Nov 29, 2017.  I have reviewed and summarized the referring note, she has past medical history of hyperlipidemia, diabetes, type 1 diabetes since she was 42 years old, no use insulin pump,  November 2018, she was lying in her bed, stretching out to opposite direction to reach her nightstand,  she developed worsening low back pain, gradually getting worse, back pain gradually improved, but she had progressively worsening bilateral lower extremity pain  She has baseline mild lower extremity deep achy pain since 2017, was diagnosed with restless legs, she complains of increased achy pain after resting, urged to move, such as sitting down watching TV, or trying to go to sleep, she like to massage move her leg to improve her symptoms,  Now she complains of almost constant 8 out of 10 bilateral lower extremity deep achy pain, worsening low back pain, radiating pain to left leg, she denies gait abnormality, no bowel bladder incontinence, and bilateral fingertips paresthesia, she has been taking gabapentin 300 mg 3 tablets at nighttime, along with Requip 0.5 mg, which has been helpful,   She had laryngeal biopsy under general anesthesia on July 17, 2017, noticed increased bilateral lower extremity discomfort achy pain since the procedure   laboratory evaluation in March 2019, A1c was 9.3, BMP was elevated glucose 186, sodium was decreased 129, normal CBC,  UPDATE January 11 2018: Lab evaluation in May 2019: normal or negative RPR, HIV, CRP, TSH, ANA, ESR, Vit D, Vit B12, immunofixation protein electrophoresis, copper level, CBC, A1c was 9.3,  MRI of the cervical and lumbar spine showed multiple mild degenerative changes but no significant canal or   foraminal narrowing  EMG nerve conduction study today showed mild axonal sensorimotor polyneuropathy, consistent with her poorly controlled diabetes.  REVIEW OF SYSTEMS: Full 14 system review of systems performed and notable only for: As above  ALLERGIES: No Known Allergies  HOME MEDICATIONS: Current Outpatient Medications  Medication Sig Dispense Refill  . albuterol (PROVENTIL HFA;VENTOLIN HFA) 108 (90 Base) MCG/ACT inhaler Inhale 2 puffs into the lungs every 6 (six) hours as needed for wheezing or shortness of breath. 1 Inhaler 2  . ALPRAZolam (XANAX) 1 MG tablet Take 1 tablet (1 mg total) by mouth at bedtime as needed for anxiety. 3 tablet 0  . atorvastatin (LIPITOR) 10 MG tablet Take 10 mg by mouth at bedtime.    . Blood Glucose Monitoring Suppl (ACCU-CHEK AVIVA PLUS) W/DEVICE KIT 1 Device by Does not apply route once. 1 kit 0  . cholecalciferol (VITAMIN D) 1000 units tablet Take 1,000 Units by mouth daily.    . clonazePAM (KLONOPIN) 1 MG tablet Take 1 mg by mouth at bedtime as needed for anxiety.     . DULoxetine (CYMBALTA) 60 MG capsule Take 1 capsule (60 mg total) by mouth daily. 30 capsule 12  . gabapentin (NEURONTIN) 300 MG capsule Take 900 mg by mouth at bedtime.     Marland Kitchen glucagon 1 MG injection Inject 1 mg into the muscle once as needed. 1 each 12  . glucose blood (ACCU-CHEK AVIVA PLUS) test strip USE TO TEST BLOOD SUGAR 7 TIMES DAILY, and lancets 7/day. DX E10.41 250 each 11  . HYDROcodone-acetaminophen (NORCO/VICODIN) 5-325 MG tablet Take 1 tablet by  mouth every 4 (four) hours as needed for moderate pain. 20 tablet 0  . ibuprofen (ADVIL,MOTRIN) 200 MG tablet Take 600-800 mg by mouth every 8 (eight) hours as needed (for pain.).    . Insulin Pen Needle 31G X 8 MM MISC Use as directed     . meclizine (ANTIVERT) 25 MG tablet Take 1 tablet (25 mg total) by mouth 3 (three) times daily as needed for dizziness. 30 tablet 0  . meloxicam (MOBIC) 15 MG tablet Take 1 tablet (15 mg total) by  mouth daily. 30 tablet 0  . metroNIDAZOLE (FLAGYL) 500 MG tablet Take 1 tablet (500 mg total) by mouth 3 (three) times daily. 30 tablet 0  . Multiple Vitamin (MULTIVITAMIN WITH MINERALS) TABS tablet Take 1 tablet by mouth daily.    . mupirocin ointment (BACTROBAN) 2 % Apply to right nasal septum twice daily for 7 days. 22 g 0  . NOVOLOG 100 UNIT/ML injection INJECT 40 UNITS DAILY PER INSULIN PUMP (MAX DOSEAGE) 20 mL 3  . omeprazole (PRILOSEC) 40 MG capsule Take 40 mg by mouth daily before breakfast.  2  . ondansetron (ZOFRAN) 4 MG tablet Take 1 tablet (4 mg total) by mouth every 8 (eight) hours as needed for nausea or vomiting. 30 tablet 1  . PARoxetine (PAXIL) 40 MG tablet Take 1 tablet (40 mg total) by mouth daily. 30 tablet 0  . rifaximin (XIFAXAN) 550 MG TABS tablet Take 1 tablet (550 mg total) by mouth 3 (three) times daily. 42 tablet 0  . rOPINIRole (REQUIP) 0.5 MG tablet TAKE THREE TABLETS BY MOUTH AT BEDTIME 90 tablet 1  . rOPINIRole (REQUIP) 1 MG tablet Take 1 mg by mouth at bedtime.     No current facility-administered medications for this visit.     PAST MEDICAL HISTORY: Past Medical History:  Diagnosis Date  . Anxiety state, unspecified 03/26/2009  . ASTHMA 02/02/2007  . DEPRESSION 02/16/2009  . DIABETES MELLITUS, TYPE I 02/02/2007  . Endometriosis   . GERD 12/21/2008  . Lesion of vocal cord   . Shortness of breath 09/25/2007  . Vocal fold leukoplakia     PAST SURGICAL HISTORY: Past Surgical History:  Procedure Laterality Date  . ABDOMINAL HYSTERECTOMY  2009   BSO  . DIRECT LARYNGOSCOPY N/A 08/17/2017   Procedure: DIRECT LARYNGOSCOPY WITH OPERATING TELESCOPE WITH BIOPSY;  Surgeon: Helayne Seminole, MD;  Location: [place];  Service: ENT;  Laterality: N/A;  . NASOPHARYNGEAL BIOPSY Right 08/17/2017   Procedure: NASOPHARYNGEAL BIOPSY RIGHT NASAL LESION;  Surgeon: Helayne Seminole, MD;  Location: [place];  Service: ENT;  Laterality: Right;    FAMILY HISTORY: Family History    Problem Relation Age of Onset  . Diabetes Mother   . Diabetes Maternal Aunt   . Emphysema Maternal Aunt   . Emphysema Maternal Uncle   . Diabetes Maternal Grandfather   . Lung cancer Maternal Aunt   . Colon cancer Neg Hx   . Kidney disease Neg Hx   . Liver disease Neg Hx     SOCIAL HISTORY:  Social History   Socioeconomic History  . Marital status: Legally Separated    Spouse name: Not on file  . Number of children: 2  . Years of education: Not on file  . Highest education level: Not on file  Occupational History    Employer: UNEMPLOYED  Social Needs  . Financial resource strain: Not on file  . Food insecurity:    Worry: Not on file  Inability: Not on file  . Transportation needs:    Medical: Not on file    Non-medical: Not on file  Tobacco Use  . Smoking status: Current Every Day Smoker    Packs/day: 1.50    Years: 25.00    Pack years: 37.50    Types: Cigarettes  . Smokeless tobacco: Never Used  . Tobacco comment: form given 06/05/14  Substance and Sexual Activity  . Alcohol use: No    Alcohol/week: 0.0 oz  . Drug use: No  . Sexual activity: Not on file  Lifestyle  . Physical activity:    Days per week: Not on file    Minutes per session: Not on file  . Stress: Not on file  Relationships  . Social connections:    Talks on phone: Not on file    Gets together: Not on file    Attends religious service: Not on file    Active member of club or organization: Not on file    Attends meetings of clubs or organizations: Not on file    Relationship status: Not on file  . Intimate partner violence:    Fear of current or ex partner: Not on file    Emotionally abused: Not on file    Physically abused: Not on file    Forced sexual activity: Not on file  Other Topics Concern  . Not on file  Social History Narrative   2 children--pt has custody. Husband pays child support.   Daily Caffeine Use-3 2 liters a day   Pt does not get regular exercise.      PHYSICAL EXAM   There were no vitals filed for this visit.  Not recorded      There is no height or weight on file to calculate BMI.  PHYSICAL EXAMNIATION:  Gen: NAD, conversant, well nourised, obese, well groomed                     Cardiovascular: Regular rate rhythm, no peripheral edema, warm, nontender. Eyes: Conjunctivae clear without exudates or hemorrhage Neck: Supple, no carotid bruits. Pulmonary: Clear to auscultation bilaterally   NEUROLOGICAL EXAM:  MENTAL STATUS: Speech:    Speech is normal; fluent and spontaneous with normal comprehension.  Cognition:     Orientation to time, place and person     Normal recent and remote memory     Normal Attention span and concentration     Normal Language, naming, repeating,spontaneous speech     Fund of knowledge   CRANIAL NERVES: CN II: Visual fields are full to confrontation. Fundoscopic exam is normal with sharp discs and no vascular changes. Pupils are round equal and briskly reactive to light. CN III, IV, VI: extraocular movement are normal. No ptosis. CN V: Facial sensation is intact to pinprick in all 3 divisions bilaterally. Corneal responses are intact.  CN VII: Face is symmetric with normal eye closure and smile. CN VIII: Hearing is normal to rubbing fingers CN IX, X: Palate elevates symmetrically. Phonation is normal. CN XI: Head turning and shoulder shrug are intact CN XII: Tongue is midline with normal movements and no atrophy.  MOTOR: There is no pronator drift of out-stretched arms. Muscle bulk and tone are normal. Muscle strength is normal.  REFLEXES: Reflexes are 2+ and symmetric at the biceps, triceps, 3/3 knees, and ankles. Plantar responses are flexor.  SENSORY: Length dependent decreased to light touch, pinprick at bilateral ankle  COORDINATION: Rapid alternating movements and fine finger movements are  intact. There is no dysmetria on finger-to-nose and heel-knee-shin.     GAIT/STANCE: Posture is normal. Gait is steady with normal steps, base, arm swing, and turning. Heel and toe walking are normal. Tandem gait is normal.  Romberg is absent.   DIAGNOSTIC DATA (LABS, IMAGING, TESTING) - I reviewed patient records, labs, notes, testing and imaging myself where available.   ASSESSMENT AND PLAN  ROCHELE LUECK is a 42 y.o. female   History of insulin-dependent diabetes, Worsening bilateral lower extremity paresthesia, worsening neck, low back pain   EMG nerve conduction study confirmed mild axonal sensorimotor polyneuropathy  MRI of cervical and lumbar spine showed mild degenerative changes, no evidence of canal or foraminal narrowing.  extensive laboratory evaluations showed poorly controlled diabetes A1c was 9.3, no other treatable etiology found,  She tried Cymbalta for 1 weeks, without significant side effect of benefit noted, but she is in the process of tapering down Prozac, she would like to try Cymbalta again afterwards.  Return to clinic for new issues  Marcial Pacas, M.D. Ph.D.  [facility] [address] [place], [place] [address] Ph: [phone] Fax: [phone]  CC: Tamsen Roers, MD

## 2018-01-13 IMAGING — CR DG CHEST 2V
2 series · 2 of 2 positions shown · non-contrast
Comparison: 07/06/2012

CLINICAL DATA: Chest wall pain for 3 days, shortness of Breath

EXAM:
CHEST  2 VIEW

[w chest pa]
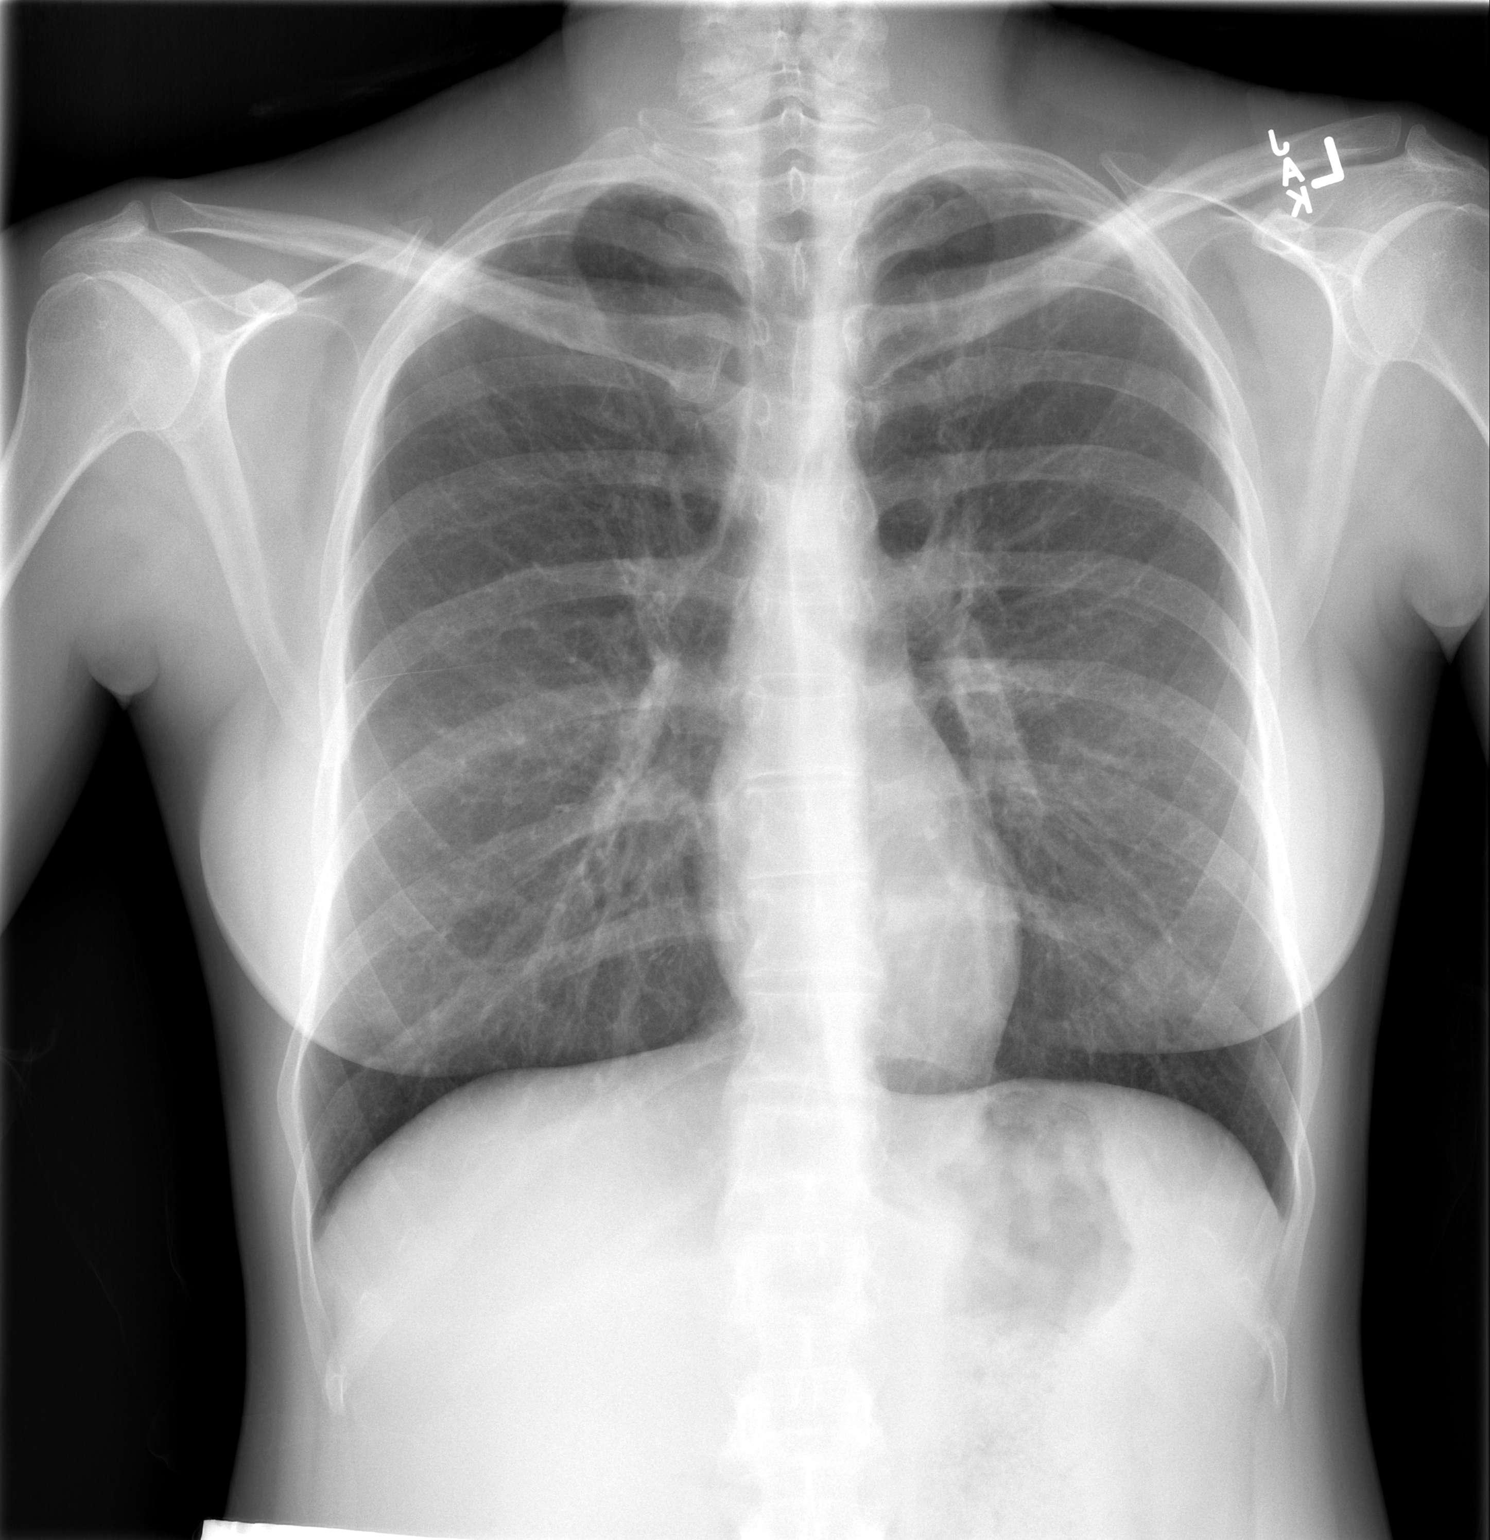

[w chest lat]
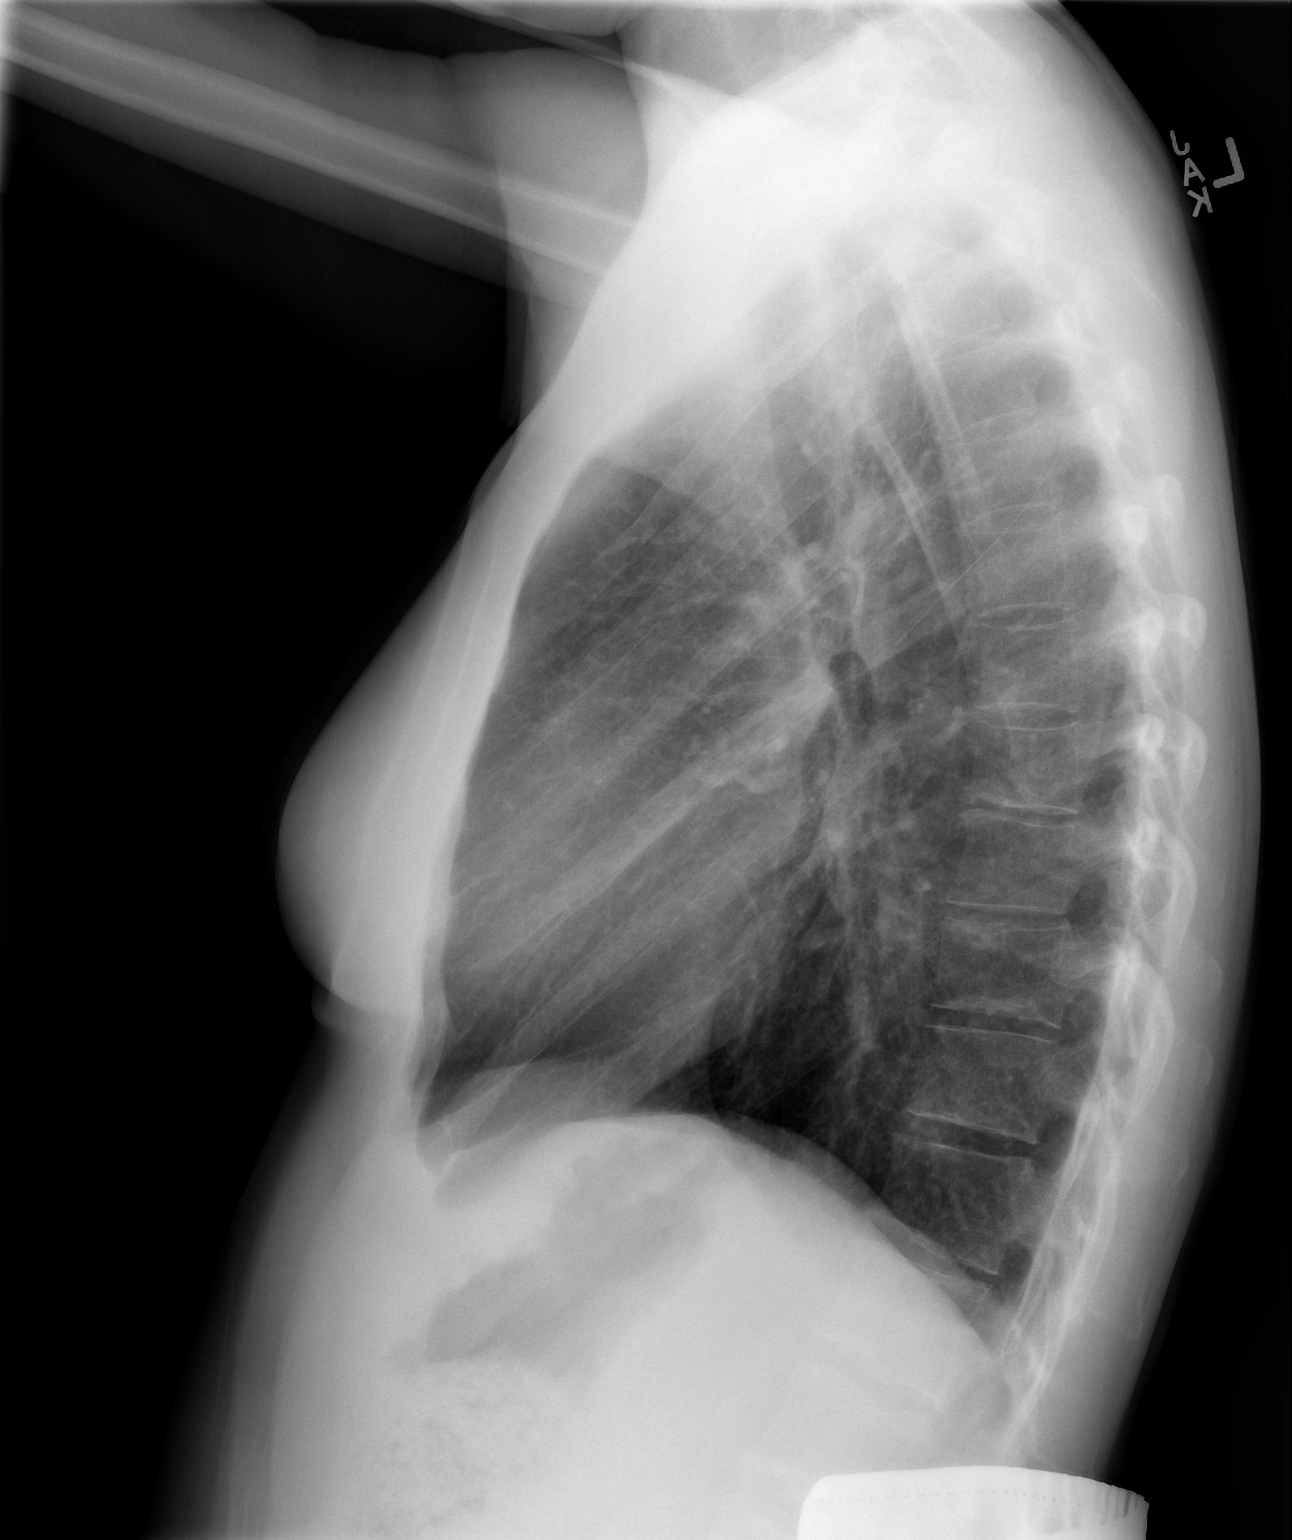

[2 of 2 positions shown; findings below may reference images not displayed]

FINDINGS: Cardiomediastinal silhouette is stable. Mild hyperinflation again
noted. No acute infiltrate or pleural effusion. No pulmonary edema.
Bony thorax is unremarkable.
IMPRESSION: No active cardiopulmonary disease.  Mild hyperinflation again noted.

## 2018-01-22 ENCOUNTER — Other Ambulatory Visit: Payer: Self-pay | Admitting: Endocrinology

## 2018-03-04 DIAGNOSIS — M170 Bilateral primary osteoarthritis of knee: Secondary | ICD-10-CM | POA: Diagnosis not present

## 2018-03-04 DIAGNOSIS — M545 Low back pain: Secondary | ICD-10-CM | POA: Diagnosis not present

## 2018-03-04 DIAGNOSIS — M4310 Spondylolisthesis, site unspecified: Secondary | ICD-10-CM | POA: Diagnosis not present

## 2018-03-05 DIAGNOSIS — E782 Mixed hyperlipidemia: Secondary | ICD-10-CM | POA: Diagnosis not present

## 2018-03-05 DIAGNOSIS — E1065 Type 1 diabetes mellitus with hyperglycemia: Secondary | ICD-10-CM | POA: Diagnosis not present

## 2018-03-05 DIAGNOSIS — G629 Polyneuropathy, unspecified: Secondary | ICD-10-CM | POA: Diagnosis not present

## 2018-03-05 DIAGNOSIS — Z79891 Long term (current) use of opiate analgesic: Secondary | ICD-10-CM | POA: Diagnosis not present

## 2018-03-11 DIAGNOSIS — M25561 Pain in right knee: Secondary | ICD-10-CM | POA: Diagnosis not present

## 2018-03-11 DIAGNOSIS — M25562 Pain in left knee: Secondary | ICD-10-CM | POA: Diagnosis not present

## 2018-03-14 DIAGNOSIS — M67441 Ganglion, right hand: Secondary | ICD-10-CM | POA: Diagnosis not present

## 2018-03-14 DIAGNOSIS — M65331 Trigger finger, right middle finger: Secondary | ICD-10-CM | POA: Diagnosis not present

## 2018-03-26 DIAGNOSIS — L0390 Cellulitis, unspecified: Secondary | ICD-10-CM | POA: Diagnosis not present

## 2018-04-01 DIAGNOSIS — Z79891 Long term (current) use of opiate analgesic: Secondary | ICD-10-CM | POA: Diagnosis not present

## 2018-04-01 DIAGNOSIS — M79642 Pain in left hand: Secondary | ICD-10-CM | POA: Diagnosis not present

## 2018-04-01 DIAGNOSIS — M79641 Pain in right hand: Secondary | ICD-10-CM | POA: Diagnosis not present

## 2018-04-01 DIAGNOSIS — M25569 Pain in unspecified knee: Secondary | ICD-10-CM | POA: Diagnosis not present

## 2018-04-03 ENCOUNTER — Other Ambulatory Visit: Payer: Self-pay

## 2018-04-03 ENCOUNTER — Emergency Department
Admission: EM | Admit: 2018-04-03 | Discharge: 2018-04-03 | Disposition: A | Discharge status: Home / Self Care | Payer: Commercial Managed Care - HMO | Attending: Emergency Medicine | Admitting: Emergency Medicine

## 2018-04-03 ENCOUNTER — Encounter: Payer: Self-pay | Admitting: *Deleted

## 2018-04-03 DIAGNOSIS — M25562 Pain in left knee: Secondary | ICD-10-CM | POA: Insufficient documentation

## 2018-04-03 DIAGNOSIS — Z794 Long term (current) use of insulin: Secondary | ICD-10-CM | POA: Insufficient documentation

## 2018-04-03 DIAGNOSIS — J449 Chronic obstructive pulmonary disease, unspecified: Secondary | ICD-10-CM | POA: Diagnosis not present

## 2018-04-03 DIAGNOSIS — M170 Bilateral primary osteoarthritis of knee: Secondary | ICD-10-CM

## 2018-04-03 DIAGNOSIS — G8929 Other chronic pain: Secondary | ICD-10-CM

## 2018-04-03 DIAGNOSIS — Z79899 Other long term (current) drug therapy: Secondary | ICD-10-CM | POA: Insufficient documentation

## 2018-04-03 DIAGNOSIS — E1040 Type 1 diabetes mellitus with diabetic neuropathy, unspecified: Secondary | ICD-10-CM | POA: Insufficient documentation

## 2018-04-03 DIAGNOSIS — M25561 Pain in right knee: Secondary | ICD-10-CM | POA: Diagnosis not present

## 2018-04-03 DIAGNOSIS — F17210 Nicotine dependence, cigarettes, uncomplicated: Secondary | ICD-10-CM | POA: Insufficient documentation

## 2018-04-03 MED ORDER — MELOXICAM 7.5 MG PO TABS: 7.5000 mg | ORAL_TABLET | Freq: Every day | ORAL | 0 refills | Status: DC

## 2018-04-03 NOTE — Discharge Instructions (Addendum)
Follow-up with Dr. Rex Kras if any continued pain medication as needed.  Begin taking meloxicam 7.5 mg 1 daily with food.  Continue taking your  medication as prescribed by Dr. Rex Kras.  Also follow-up with your orthopedist about your chronic knee pain and osteoarthritis.  Consider total knee replacements in the future.

## 2018-04-03 NOTE — ED Triage Notes (Signed)
Pt has bilateral knee pain.  Hx arthritis.  No known injury.  Pain x 3 days.  Pt alert.

## 2018-04-03 NOTE — ED Provider Notes (Signed)
[facility] Emergency Department Provider Note  ____________________________________________   None    (approximate)  I have reviewed the triage vital signs and the nursing notes.   HISTORY  Chief Complaint Knee Pain   HPI [name] is a 42 y.o. female [name] to the ED with complaint of bilateral knee pain.  Patient states that she has had pain for the last 3 days.  She has history of arthritis and has been told that she needs knee replacements.  She denies any recent injury to her knees.  There is been no fever, chills, nausea or vomiting.  Further history reveals that patient was seen by the orthopedist this morning and was told that they could not write any pain medication due to her signing a management agreement with her PCP for controlled substances.  Patient states that it is different if she comes to the ED.  She continues to take her pain medication.  She rates her pain as an 8 out of 10.  Past Medical History:  Diagnosis Date  . Anxiety state, unspecified 03/26/2009  . ASTHMA 02/02/2007  . DEPRESSION 02/16/2009  . DIABETES MELLITUS, TYPE I 02/02/2007  . Endometriosis   . GERD 12/21/2008  . Lesion of vocal cord   . Shortness of breath 09/25/2007  . Vocal fold leukoplakia     Patient Active Problem List   Diagnosis Date Noted  . Diabetic peripheral neuropathy ([place]) 01/11/2018  . Hyperreflexia 11/29/2017  . Low back pain with left-sided sciatica 11/29/2017  . Peripheral polyneuropathy 11/29/2017  . Multinodular goiter 07/29/2015  . Type 1 diabetes mellitus with neurological manifestations, uncontrolled ([place]) 01/27/2014  . Restless leg 11/07/2013  . Current tobacco use 11/07/2013  . Triggering of digit 08/13/2013  . Hematuria 03/26/2012  . Chronic obstructive pulmonary disease ([place]) 02/15/2012  . CN (constipation) 01/01/2012  . H/O respiratory system disease 10/04/2011  . Cholesteatoma 09/11/2011  . Allergy to environmental factors  09/11/2011  . Anxiety 05/01/2011  . Myalgia 11/10/2010  . AIRWAY INFLAMMATION 06/08/2010  . Nonspecific (abnormal) findings on radiological and other examination of body structure 05/12/2010  . [place] LUNG FIELD 05/12/2010  . Type 1 diabetes mellitus ([place]) 04/01/2010  . Acid reflux 04/01/2010  . Absence of bladder continence 04/01/2010  . VULVAR ABSCESS 03/18/2010  . UTI 03/09/2010  . INSOMNIA 02/18/2010  . FOLLICULITIS 39/76/7341  . ANXIETY STATE, UNSPECIFIED 03/26/2009  . NAUSEA 02/19/2009  . DEPRESSION 02/16/2009  . GERD 12/21/2008  . ASTHMATIC BRONCHITIS, ACUTE 08/12/2008  . ACUTE MAXILLARY SINUSITIS 05/04/2008  . MOLE 01/29/2008  . Diarrhea 10/16/2007  . SMOKER 09/25/2007  . SHORTNESS OF BREATH 09/25/2007  . ASTHMA 02/02/2007    Past Surgical History:  Procedure Laterality Date  . ABDOMINAL HYSTERECTOMY  2009   BSO  . DIRECT LARYNGOSCOPY N/A 08/17/2017   Procedure: DIRECT LARYNGOSCOPY WITH OPERATING TELESCOPE WITH BIOPSY;  Surgeon: Helayne Seminole, MD;  Location: [place];  Service: ENT;  Laterality: N/A;  . NASOPHARYNGEAL BIOPSY Right 08/17/2017   Procedure: NASOPHARYNGEAL BIOPSY RIGHT NASAL LESION;  Surgeon: Helayne Seminole, MD;  Location: [place];  Service: ENT;  Laterality: Right;    Prior to Admission medications   Medication Sig Start Date End Date Taking? Authorizing Provider  albuterol (PROVENTIL HFA;VENTOLIN HFA) 108 (90 Base) MCG/ACT inhaler Inhale 2 puffs into the lungs every 6 (six) hours as needed for wheezing or shortness of breath. 05/12/16   Earleen Newport, MD  ALPRAZolam (XANAX) 1 MG tablet Take 1 tablet (1 mg total) by mouth at bedtime as needed for anxiety. 11/29/17   Marcial Pacas, MD  atorvastatin (LIPITOR) 10 MG tablet Take 10 mg by mouth at bedtime.    [provider]  Blood Glucose Monitoring Suppl (ACCU-CHEK AVIVA PLUS) W/DEVICE KIT 1 Device by Does not apply route once. 01/27/14   Renato Shin, MD    cholecalciferol (VITAMIN D) 1000 units tablet Take 1,000 Units by mouth daily.    [provider]  clonazePAM (KLONOPIN) 1 MG tablet Take 1 mg by mouth at bedtime as needed for anxiety.     [provider]  DULoxetine (CYMBALTA) 60 MG capsule Take 1 capsule (60 mg total) by mouth daily. 11/29/17   Marcial Pacas, MD  gabapentin (NEURONTIN) 300 MG capsule Take 900 mg by mouth at bedtime.  12/03/13   [provider]  glucagon 1 MG injection Inject 1 mg into the muscle once as needed. 07/29/15   Renato Shin, MD  glucose blood (ACCU-CHEK AVIVA PLUS) test strip USE TO TEST BLOOD SUGAR 7 TIMES DAILY, and lancets 7/day. DX E10.41 07/29/15   Renato Shin, MD  HYDROcodone-acetaminophen (NORCO/VICODIN) 5-325 MG tablet Take 1 tablet by mouth every 4 (four) hours as needed for moderate pain. 08/11/17 08/11/18  Triplett, Johnette Abraham B, FNP  ibuprofen (ADVIL,MOTRIN) 200 MG tablet Take 600-800 mg by mouth every 8 (eight) hours as needed (for pain.).    [provider]  Insulin Pen Needle 31G X 8 MM MISC Use as directed     [provider]  meclizine (ANTIVERT) 25 MG tablet Take 1 tablet (25 mg total) by mouth 3 (three) times daily as needed for dizziness. 10/10/17   Darel Hong, MD  meloxicam (MOBIC) 7.5 MG tablet Take 1 tablet (7.5 mg total) by mouth daily. 04/03/18 04/03/19  Johnn Hai, PA-C  metroNIDAZOLE (FLAGYL) 500 MG tablet Take 1 tablet (500 mg total) by mouth 3 (three) times daily. 11/27/17   Armbruster, Carlota Raspberry, MD  Multiple Vitamin (MULTIVITAMIN WITH MINERALS) TABS tablet Take 1 tablet by mouth daily.    [provider]  mupirocin ointment (BACTROBAN) 2 % Apply to right nasal septum twice daily for 7 days. 08/17/17 08/17/18  Marcellino, San Jetty, MD  NOVOLOG 100 UNIT/ML injection INJECT 40 UNITS DAILY PER INSULIN PUMP (MAX DOSAGE) 01/22/18   Renato Shin, MD  omeprazole (PRILOSEC) 40 MG capsule Take 40 mg by mouth daily before breakfast. 08/05/17   [provider]  ondansetron (ZOFRAN) 4 MG tablet Take 1 tablet (4 mg total) by mouth every 8 (eight) hours as needed for nausea or vomiting. 06/19/17   Yetta Flock, MD  PARoxetine (PAXIL) 40 MG tablet Take 1 tablet (40 mg total) by mouth daily. 11/10/13   Renato Shin, MD  rifaximin (XIFAXAN) 550 MG TABS tablet Take 1 tablet (550 mg total) by mouth 3 (three) times daily. 07/13/17   Armbruster, Carlota Raspberry, MD  rOPINIRole (REQUIP) 0.5 MG tablet TAKE THREE TABLETS BY MOUTH AT BEDTIME 08/20/13   Renato Shin, MD  rOPINIRole (REQUIP) 1 MG tablet Take 1 mg by mouth at bedtime.    [provider]    Allergies Patient has no known allergies.  Family History  Problem Relation Age of Onset  . Diabetes Mother   . Diabetes Maternal Aunt   . Emphysema Maternal Aunt   . Emphysema Maternal Uncle   . Diabetes Maternal Grandfather   . Lung cancer Maternal Aunt   .  Colon cancer Neg Hx   . Kidney disease Neg Hx   . Liver disease Neg Hx     Social History Social History   Tobacco Use  . Smoking status: Current Every Day Smoker    Packs/day: 1.50    Years: 25.00    Pack years: 37.50    Types: Cigarettes  . Smokeless tobacco: Never Used  . Tobacco comment: form given 06/05/14  Substance Use Topics  . Alcohol use: No    Alcohol/week: 0.0 standard drinks  . Drug use: No    Review of Systems Constitutional: No fever/chills Cardiovascular: Denies chest pain. Respiratory: Denies shortness of breath. Musculoskeletal: Positive for bilateral knee pain chronically. Skin: Negative for rash. Neurological: Negative for headaches, focal weakness or numbness. ____________________________________________   PHYSICAL EXAM:  VITAL SIGNS: ED Triage Vitals  Enc Vitals Group     BP 04/03/18 1659 (!) 151/83     Pulse Rate 04/03/18 1659 87     Resp 04/03/18 1659 18     Temp 04/03/18 1659 99.8 F (37.7 C)     Temp Source 04/03/18 1659 Oral     SpO2 04/03/18 1659 100 %     Weight  04/03/18 1659 130 lb (59 kg)     Height 04/03/18 1659 '5\' 4"'  (1.626 m)     Head Circumference --      Peak Flow --      Pain Score 04/03/18 1702 8     Pain Loc --      Pain Edu? --      Excl. in [place]? --    Constitutional: Alert and oriented. Well appearing and in no acute distress. Eyes: Conjunctivae are normal.  Head: Atraumatic. Neck: No stridor.   Cardiovascular: Normal rate, regular rhythm. Grossly normal heart sounds.  Good peripheral circulation. Respiratory: Normal respiratory effort.  No retractions. Lungs CTAB. Musculoskeletal: On examination of bilateral knees there is no gross deformity and no effusion present.  All range of motion both knees have marked amount of crepitus.  Patient is able to flex and extend without any restriction.  Skin is intact.  No erythema or warmth is noted.  No evidence of effusion present. Neurologic:  Normal speech and language. No gross focal neurologic deficits are appreciated.  Skin:  Skin is warm, dry and intact. No rash noted. Psychiatric: Mood and affect are normal. Speech and behavior are normal.  ____________________________________________   LABS (all labs ordered are listed, but only abnormal results are displayed)  Labs Reviewed - No data to display  PROCEDURES  Procedure(s) performed: None  Procedures  Critical Care performed: No  ____________________________________________   INITIAL IMPRESSION / ASSESSMENT AND PLAN / ED COURSE  As part of my medical decision making, I reviewed the following data within the electronic MEDICAL RECORD NUMBER Notes from prior ED visits and [place] Controlled Substance Database.  In review patient has been to the orthopedist and all records were reviewed from today's visit.  Also controlled substance database shows that the patient was given a prescription on 9/16 by Dr. Rex Kras for hydrocodone 7.5 mg/acetaminophen #80 for the next 26 days.  Patient was offered Voltaren gel which she states actually made her  knees hurt worse.  She was given a prescription for meloxicam 7.5 mg 1 daily with food.  She is to call her doctor tomorrow to discuss further pain medication however after reading her orthopedic visit today it appears that total knee replacements are strongly advised.  ____________________________________________   FINAL CLINICAL  IMPRESSION(S) / ED DIAGNOSES  Final diagnoses:  Bilateral chronic knee pain  Osteoarthritis of both knees, unspecified osteoarthritis type     ED Discharge Orders         Ordered    meloxicam (MOBIC) 7.5 MG tablet  Daily     04/03/18 1807           Note:  This document was prepared using Dragon voice recognition software and may include unintentional dictation errors.    Johnn Hai, PA-C 04/03/18 1833    Earleen Newport, MD 04/03/18 [phone]

## 2018-04-08 DIAGNOSIS — E039 Hypothyroidism, unspecified: Secondary | ICD-10-CM | POA: Diagnosis not present

## 2018-04-08 DIAGNOSIS — M2550 Pain in unspecified joint: Secondary | ICD-10-CM | POA: Diagnosis not present

## 2018-04-13 DIAGNOSIS — M25562 Pain in left knee: Secondary | ICD-10-CM | POA: Diagnosis not present

## 2018-04-13 DIAGNOSIS — M25561 Pain in right knee: Secondary | ICD-10-CM | POA: Diagnosis not present

## 2018-04-18 DIAGNOSIS — M179 Osteoarthritis of knee, unspecified: Secondary | ICD-10-CM | POA: Insufficient documentation

## 2018-04-18 DIAGNOSIS — M170 Bilateral primary osteoarthritis of knee: Secondary | ICD-10-CM | POA: Diagnosis not present

## 2018-04-18 DIAGNOSIS — M25561 Pain in right knee: Secondary | ICD-10-CM | POA: Diagnosis not present

## 2018-04-18 DIAGNOSIS — M25562 Pain in left knee: Secondary | ICD-10-CM | POA: Diagnosis not present

## 2018-04-23 DIAGNOSIS — M13862 Other specified arthritis, left knee: Secondary | ICD-10-CM | POA: Diagnosis not present

## 2018-04-23 DIAGNOSIS — M13861 Other specified arthritis, right knee: Secondary | ICD-10-CM | POA: Diagnosis not present

## 2018-05-03 DIAGNOSIS — M25512 Pain in left shoulder: Secondary | ICD-10-CM | POA: Diagnosis not present

## 2018-05-03 DIAGNOSIS — M25361 Other instability, right knee: Secondary | ICD-10-CM | POA: Diagnosis not present

## 2018-05-09 DIAGNOSIS — G894 Chronic pain syndrome: Secondary | ICD-10-CM | POA: Diagnosis not present

## 2018-05-09 DIAGNOSIS — M542 Cervicalgia: Secondary | ICD-10-CM | POA: Diagnosis not present

## 2018-05-09 DIAGNOSIS — M545 Low back pain: Secondary | ICD-10-CM | POA: Diagnosis not present

## 2018-05-10 ENCOUNTER — Emergency Department: Payer: 59

## 2018-05-10 ENCOUNTER — Other Ambulatory Visit: Payer: Self-pay

## 2018-05-10 ENCOUNTER — Encounter: Payer: Self-pay | Admitting: Emergency Medicine

## 2018-05-10 ENCOUNTER — Emergency Department
Admission: EM | Admit: 2018-05-10 | Discharge: 2018-05-10 | Disposition: A | Discharge status: Home / Self Care | Payer: 59 | Attending: Student in an Organized Health Care Education/Training Program | Admitting: Student in an Organized Health Care Education/Training Program

## 2018-05-10 DIAGNOSIS — R0789 Other chest pain: Secondary | ICD-10-CM | POA: Diagnosis not present

## 2018-05-10 DIAGNOSIS — E1040 Type 1 diabetes mellitus with diabetic neuropathy, unspecified: Secondary | ICD-10-CM | POA: Diagnosis not present

## 2018-05-10 DIAGNOSIS — E1049 Type 1 diabetes mellitus with other diabetic neurological complication: Secondary | ICD-10-CM | POA: Diagnosis not present

## 2018-05-10 DIAGNOSIS — F17210 Nicotine dependence, cigarettes, uncomplicated: Secondary | ICD-10-CM | POA: Diagnosis not present

## 2018-05-10 DIAGNOSIS — Z79899 Other long term (current) drug therapy: Secondary | ICD-10-CM | POA: Insufficient documentation

## 2018-05-10 DIAGNOSIS — J449 Chronic obstructive pulmonary disease, unspecified: Secondary | ICD-10-CM | POA: Insufficient documentation

## 2018-05-10 DIAGNOSIS — R079 Chest pain, unspecified: Secondary | ICD-10-CM | POA: Diagnosis not present

## 2018-05-10 DIAGNOSIS — R918 Other nonspecific abnormal finding of lung field: Secondary | ICD-10-CM | POA: Diagnosis not present

## 2018-05-10 HISTORY — DX: Hyperlipidemia, unspecified: E78.5

## 2018-05-10 LAB — URINALYSIS, COMPLETE (UACMP) WITH MICROSCOPIC
BILIRUBIN URINE: NEGATIVE
Glucose, UA: 50 mg/dL — AB
Ketones, ur: NEGATIVE mg/dL
LEUKOCYTES UA: NEGATIVE
NITRITE: NEGATIVE
PH: 6 (ref 5.0–8.0)
Protein, ur: NEGATIVE mg/dL
SPECIFIC GRAVITY, URINE: 1.002 — AB (ref 1.005–1.030)

## 2018-05-10 LAB — BASIC METABOLIC PANEL
Anion gap: 8 (ref 5–15)
BUN: 7 mg/dL (ref 6–20)
CALCIUM: 9.3 mg/dL (ref 8.9–10.3)
CO2: 25 mmol/L (ref 22–32)
Chloride: 101 mmol/L (ref 98–111)
Creatinine, Ser: 0.74 mg/dL (ref 0.44–1.00)
GFR calc non Af Amer: >60 mL/min (ref >60)
Glucose, Bld: 204 mg/dL — ABNORMAL HIGH (ref 70–99)
Potassium: 4.4 mmol/L (ref 3.5–5.1)
SODIUM: 134 mmol/L — AB (ref 135–145)

## 2018-05-10 LAB — CBC
HCT: 39.7 % (ref 36.0–46.0)
Hemoglobin: 13.7 g/dL (ref 12.0–15.0)
MCH: 31.6 pg (ref 26.0–34.0)
MCHC: 34.5 g/dL (ref 30.0–36.0)
MCV: 91.5 fL (ref 80.0–100.0)
NRBC: 0 % (ref 0.0–0.2)
PLATELETS: 246 10*3/uL (ref 150–400)
RBC: 4.34 MIL/uL (ref 3.87–5.11)
RDW: 12.4 % (ref 11.5–15.5)
WBC: 7 10*3/uL (ref 4.0–10.5)

## 2018-05-10 LAB — TROPONIN I: Troponin I: <0.03 ng/mL (ref <0.03)

## 2018-05-10 MED ORDER — IOHEXOL 350 MG/ML SOLN
75.0000 mL | Freq: Once | INTRAVENOUS | Status: AC | PRN
  Administered 2018-05-10: 75 mL via INTRAVENOUS

## 2018-05-10 MED ORDER — HYDROCODONE-ACETAMINOPHEN 5-325 MG PO TABS
1.0000 | ORAL_TABLET | Freq: Once | ORAL | Status: AC
  Administered 2018-05-10: 1 via ORAL
  Filled 2018-05-10: qty 1

## 2018-05-10 NOTE — ED Provider Notes (Signed)
[facility] Emergency Department Provider Note    None    (approximate)  I have reviewed the triage vital signs and the nursing notes.   HISTORY  Chief Complaint Shoulder Pain; Jaw Pain; and Dizziness    HPI [name] is a 42 y.o. female history as listed below presents the ER with chest pain for the past week associated with some pain in her teeth.  Does feel like she grinds her teeth.  States that she also has issues with chronic back pain and osteoarthritis.  Is having pain in her left shoulder.  States that she was having pain in her left shoulder today so she came to the ER.  Denies any fevers.  No nausea or vomiting.  Patient ambulating about ER room in no acute distress.    Past Medical History:  Diagnosis Date  . Anxiety state, unspecified 03/26/2009  . ASTHMA 02/02/2007  . DEPRESSION 02/16/2009  . DIABETES MELLITUS, TYPE I 02/02/2007  . Endometriosis   . GERD 12/21/2008  . Hyperlipidemia   . Lesion of vocal cord   . Shortness of breath 09/25/2007  . Vocal fold leukoplakia    Family History  Problem Relation Age of Onset  . Diabetes Mother   . Diabetes Maternal Aunt   . Emphysema Maternal Aunt   . Emphysema Maternal Uncle   . Diabetes Maternal Grandfather   . Lung cancer Maternal Aunt   . Colon cancer Neg Hx   . Kidney disease Neg Hx   . Liver disease Neg Hx    Past Surgical History:  Procedure Laterality Date  . ABDOMINAL HYSTERECTOMY  2009   BSO  . DIRECT LARYNGOSCOPY N/A 08/17/2017   Procedure: DIRECT LARYNGOSCOPY WITH OPERATING TELESCOPE WITH BIOPSY;  Surgeon: Helayne Seminole, MD;  Location: [place];  Service: ENT;  Laterality: N/A;  . NASOPHARYNGEAL BIOPSY Right 08/17/2017   Procedure: NASOPHARYNGEAL BIOPSY RIGHT NASAL LESION;  Surgeon: Helayne Seminole, MD;  Location: [place];  Service: ENT;  Laterality: Right;   Patient Active Problem List   Diagnosis Date Noted  . Diabetic peripheral neuropathy ([place]) 01/11/2018  .  Hyperreflexia 11/29/2017  . Low back pain with left-sided sciatica 11/29/2017  . Peripheral polyneuropathy 11/29/2017  . Multinodular goiter 07/29/2015  . Type 1 diabetes mellitus with neurological manifestations, uncontrolled ([place]) 01/27/2014  . Restless leg 11/07/2013  . Current tobacco use 11/07/2013  . Triggering of digit 08/13/2013  . Hematuria 03/26/2012  . Chronic obstructive pulmonary disease ([place]) 02/15/2012  . CN (constipation) 01/01/2012  . H/O respiratory system disease 10/04/2011  . Cholesteatoma 09/11/2011  . Allergy to environmental factors 09/11/2011  . Anxiety 05/01/2011  . Myalgia 11/10/2010  . AIRWAY INFLAMMATION 06/08/2010  . Nonspecific (abnormal) findings on radiological and other examination of body structure 05/12/2010  . [place] LUNG FIELD 05/12/2010  . Type 1 diabetes mellitus ([place]) 04/01/2010  . Acid reflux 04/01/2010  . Absence of bladder continence 04/01/2010  . VULVAR ABSCESS 03/18/2010  . UTI 03/09/2010  . INSOMNIA 02/18/2010  . FOLLICULITIS 64/33/2951  . ANXIETY STATE, UNSPECIFIED 03/26/2009  . NAUSEA 02/19/2009  . DEPRESSION 02/16/2009  . GERD 12/21/2008  . ASTHMATIC BRONCHITIS, ACUTE 08/12/2008  . ACUTE MAXILLARY SINUSITIS 05/04/2008  . MOLE 01/29/2008  . Diarrhea 10/16/2007  . SMOKER 09/25/2007  . SHORTNESS OF BREATH 09/25/2007  . ASTHMA 02/02/2007      Prior to Admission medications   Medication Sig Start Date End Date Taking?  Authorizing Provider  albuterol (PROVENTIL HFA;VENTOLIN HFA) 108 (90 Base) MCG/ACT inhaler Inhale 2 puffs into the lungs every 6 (six) hours as needed for wheezing or shortness of breath. 05/12/16   Earleen Newport, MD  ALPRAZolam Duanne Moron) 1 MG tablet Take 1 tablet (1 mg total) by mouth at bedtime as needed for anxiety. 11/29/17   Marcial Pacas, MD  atorvastatin (LIPITOR) 10 MG tablet Take 10 mg by mouth at bedtime.    [provider]  Blood Glucose Monitoring Suppl  (ACCU-CHEK AVIVA PLUS) W/DEVICE KIT 1 Device by Does not apply route once. 01/27/14   Renato Shin, MD  cholecalciferol (VITAMIN D) 1000 units tablet Take 1,000 Units by mouth daily.    [provider]  clonazePAM (KLONOPIN) 1 MG tablet Take 1 mg by mouth at bedtime as needed for anxiety.     [provider]  DULoxetine (CYMBALTA) 60 MG capsule Take 1 capsule (60 mg total) by mouth daily. 11/29/17   Marcial Pacas, MD  gabapentin (NEURONTIN) 300 MG capsule Take 900 mg by mouth at bedtime.  12/03/13   [provider]  glucagon 1 MG injection Inject 1 mg into the muscle once as needed. 07/29/15   Renato Shin, MD  glucose blood (ACCU-CHEK AVIVA PLUS) test strip USE TO TEST BLOOD SUGAR 7 TIMES DAILY, and lancets 7/day. DX E10.41 07/29/15   Renato Shin, MD  HYDROcodone-acetaminophen (NORCO/VICODIN) 5-325 MG tablet Take 1 tablet by mouth every 4 (four) hours as needed for moderate pain. 08/11/17 08/11/18  Triplett, Johnette Abraham B, FNP  ibuprofen (ADVIL,MOTRIN) 200 MG tablet Take 600-800 mg by mouth every 8 (eight) hours as needed (for pain.).    [provider]  Insulin Pen Needle 31G X 8 MM MISC Use as directed     [provider]  meclizine (ANTIVERT) 25 MG tablet Take 1 tablet (25 mg total) by mouth 3 (three) times daily as needed for dizziness. 10/10/17   Darel Hong, MD  meloxicam (MOBIC) 7.5 MG tablet Take 1 tablet (7.5 mg total) by mouth daily. 04/03/18 04/03/19  Johnn Hai, PA-C  metroNIDAZOLE (FLAGYL) 500 MG tablet Take 1 tablet (500 mg total) by mouth 3 (three) times daily. 11/27/17   Armbruster, Carlota Raspberry, MD  Multiple Vitamin (MULTIVITAMIN WITH MINERALS) TABS tablet Take 1 tablet by mouth daily.    [provider]  mupirocin ointment (BACTROBAN) 2 % Apply to right nasal septum twice daily for 7 days. 08/17/17 08/17/18  Marcellino, San Jetty, MD  NOVOLOG 100 UNIT/ML injection INJECT 40 UNITS DAILY PER INSULIN PUMP (MAX DOSAGE) 01/22/18   Renato Shin, MD    omeprazole (PRILOSEC) 40 MG capsule Take 40 mg by mouth daily before breakfast. 08/05/17   [provider]  ondansetron (ZOFRAN) 4 MG tablet Take 1 tablet (4 mg total) by mouth every 8 (eight) hours as needed for nausea or vomiting. 06/19/17   Yetta Flock, MD  PARoxetine (PAXIL) 40 MG tablet Take 1 tablet (40 mg total) by mouth daily. 11/10/13   Renato Shin, MD  rifaximin (XIFAXAN) 550 MG TABS tablet Take 1 tablet (550 mg total) by mouth 3 (three) times daily. 07/13/17   Armbruster, Carlota Raspberry, MD  rOPINIRole (REQUIP) 0.5 MG tablet TAKE THREE TABLETS BY MOUTH AT BEDTIME 08/20/13   Renato Shin, MD  rOPINIRole (REQUIP) 1 MG tablet Take 1 mg by mouth at bedtime.    [provider]    Allergies Patient has no known allergies.    Social History Social  History   Tobacco Use  . Smoking status: Current Every Day Smoker    Packs/day: 1.50    Years: 25.00    Pack years: 37.50    Types: Cigarettes  . Smokeless tobacco: Never Used  . Tobacco comment: form given 06/05/14  Substance Use Topics  . Alcohol use: No    Alcohol/week: 0.0 standard drinks  . Drug use: No    Review of Systems Patient denies headaches, rhinorrhea, blurry vision, numbness, shortness of breath, chest pain, edema, cough, abdominal pain, nausea, vomiting, diarrhea, dysuria, fevers, rashes or hallucinations unless otherwise stated above in HPI. ____________________________________________   PHYSICAL EXAM:  VITAL SIGNS: Vitals:   05/10/18 2043 05/10/18 2100  BP: (!) 173/90   Pulse: 81 71  Resp:    Temp:    SpO2: 98% 98%    Constitutional: Alert and oriented.  Eyes: Conjunctivae are normal.  Head: Atraumatic. Nose: No congestion/rhinnorhea. Mouth/Throat: Mucous membranes are moist.   Neck: No stridor. Painless ROM.  Cardiovascular: Normal rate, regular rhythm. Grossly normal heart sounds.  Good peripheral circulation. Respiratory: Normal respiratory effort.  No retractions. Lungs  CTAB. Gastrointestinal: Soft and nontender. No distention. No abdominal bruits. No CVA tenderness. Genitourinary:  Musculoskeletal: No lower extremity tenderness nor edema.  No joint effusions. Neurologic:  Normal speech and language. No gross focal neurologic deficits are appreciated. No facial droop Skin:  Skin is warm, dry and intact. No rash noted. Psychiatric: Mood and affect are normal. Speech and behavior are normal.  ____________________________________________   LABS (all labs ordered are listed, but only abnormal results are displayed)  Results for orders placed or performed during the hospital encounter of 05/10/18 (from the past 24 hour(s))  Basic metabolic panel     Status: Abnormal   Collection Time: 05/10/18  7:58 PM  Result Value Ref Range   Sodium 134 (L) 135 - 145 mmol/L   Potassium 4.4 3.5 - 5.1 mmol/L   Chloride 101 98 - 111 mmol/L   CO2 25 22 - 32 mmol/L   Glucose, Bld 204 (H) 70 - 99 mg/dL   BUN 7 6 - 20 mg/dL   Creatinine, Ser 0.74 0.44 - 1.00 mg/dL   Calcium 9.3 8.9 - 10.3 mg/dL   GFR calc non Af Amer >60 >60 mL/min   GFR calc Af Amer >60 >60 mL/min   Anion gap 8 5 - 15  CBC     Status: None   Collection Time: 05/10/18  7:58 PM  Result Value Ref Range   WBC 7.0 4.0 - 10.5 K/uL   RBC 4.34 3.87 - 5.11 MIL/uL   Hemoglobin 13.7 12.0 - 15.0 g/dL   HCT 39.7 36.0 - 46.0 %   MCV 91.5 80.0 - 100.0 fL   MCH 31.6 26.0 - 34.0 pg   MCHC 34.5 30.0 - 36.0 g/dL   RDW 12.4 11.5 - 15.5 %   Platelets 246 150 - 400 K/uL   nRBC 0.0 0.0 - 0.2 %  Urinalysis, Complete w Microscopic     Status: Abnormal   Collection Time: 05/10/18  7:58 PM  Result Value Ref Range   Color, Urine COLORLESS (A) YELLOW   APPearance CLEAR (A) CLEAR   Specific Gravity, Urine 1.002 (L) 1.005 - 1.030   pH 6.0 5.0 - 8.0   Glucose, UA 50 (A) NEGATIVE mg/dL   Hgb urine dipstick SMALL (A) NEGATIVE   Bilirubin Urine NEGATIVE NEGATIVE   Ketones, ur NEGATIVE NEGATIVE mg/dL   Protein, ur NEGATIVE  NEGATIVE  mg/dL   Nitrite NEGATIVE NEGATIVE   Leukocytes, UA NEGATIVE NEGATIVE   RBC / HPF 0-5 0 - 5 RBC/hpf   WBC, UA 0-5 0 - 5 WBC/hpf   Bacteria, UA RARE (A) NONE SEEN   Squamous Epithelial / LPF 0-5 0 - 5   Mucus PRESENT   Troponin I     Status: None   Collection Time: 05/10/18  8:13 PM  Result Value Ref Range   Troponin I <0.03 <0.03 ng/mL   ____________________________________________  EKG My review and personal interpretation at Time: 20:05   Indication: chest pain  Rate: 90  Rhythm: sinus Axis: normal Other: normal intervals, no stemi ____________________________________________  RADIOLOGY  I personally reviewed all radiographic images ordered to evaluate for the above acute complaints and reviewed radiology reports and findings.  These findings were personally discussed with the patient.  Please see medical record for radiology report.  ____________________________________________   PROCEDURES  Procedure(s) performed:  Procedures    Critical Care performed: no ____________________________________________   INITIAL IMPRESSION / ASSESSMENT AND PLAN / ED COURSE  Pertinent labs & imaging results that were available during my care of the patient were reviewed by me and considered in my medical decision making (see chart for details).   DDX: ACS, pericarditis, esophagitis, boerhaaves, pe, dissection, pna, bronchitis, costochondritis   [name] is a 42 y.o. who presents to the ED with symptoms as described above.  Seems atypical for ACS.  Seems most likely musculoskeletal.  Troponin negative.  Given her episodes of pain for several days do feel one troponin sufficient.  No signs of bronchitis.  Doubt PE.  Chest x-ray will be ordered to evaluate for the above differential.  Will give pain medication for muscle skeletal pain.  Clinical Course as of May 10 2316  Fri May 10, 2018  2129 Chest x-ray does show evidence of left apical nodule.  Given her new  symptom further evaluation CT.  Angiogram to include exclude aneurysm dissection given her presentation.   [PR]    Clinical Course User Index [PR] Merlyn Lot, MD   CT Joetta Manners does not show any evidence of dissection or aneurysm.  Nodule seen on chest x-ray likely bone island.  At this point patient stable and appropriate for outpatient follow-up.  As part of my medical decision making, I reviewed the following data within the [place] notes reviewed and incorporated, Labs reviewed, notes from prior ED visits and [place] Controlled Substance Database   ____________________________________________   FINAL CLINICAL IMPRESSION(S) / ED DIAGNOSES  Final diagnoses:  Atypical chest pain      NEW MEDICATIONS STARTED DURING THIS VISIT:  New Prescriptions   No medications on file     Note:  This document was prepared using Dragon voice recognition software and may include unintentional dictation errors.    Merlyn Lot, MD 05/10/18 [phone]

## 2018-05-10 NOTE — ED Notes (Signed)
Pt has insulin pump disconnected, DM type1

## 2018-05-10 NOTE — ED Notes (Signed)
Pt to the er for multiple medical complaints that started over three days ago. Pt had vision changes intermittently over the last 3 days that she sees spots or flashes of light. Pt c/o teeth and jaw pain over the jaw bilaterally. Pt states she has trigger finger, a bad back, osteoarthritis. Pt denies pain now. Pt denies chest pain. Pt denies pain when opening jaw. No TMJ detected. Pt has pain in her shoulders. Pt reports chest pain in the morning when she wakes up but says she smokes a lot especially at night. Pt VSS. Hx of HTN.

## 2018-05-10 NOTE — ED Triage Notes (Signed)
Pt reports on Wednesday she started to have left shoulder pain, jaw pain, vision change. (seeing flash lights) Pt walks with steady gait, pt talks in complete sentences no distress noted

## 2018-05-10 NOTE — ED Notes (Signed)
Pt to the desk asking to go to the vending machine for chips. Told pt to come right back.

## 2018-05-10 NOTE — ED Notes (Signed)
Pt asking for diet drink. Pt given a diet [place].

## 2018-05-15 ENCOUNTER — Other Ambulatory Visit: Payer: Self-pay | Admitting: Endocrinology

## 2018-05-15 DIAGNOSIS — M25561 Pain in right knee: Secondary | ICD-10-CM | POA: Diagnosis not present

## 2018-05-15 DIAGNOSIS — M25562 Pain in left knee: Secondary | ICD-10-CM | POA: Diagnosis not present

## 2018-05-15 DIAGNOSIS — E103491 Type 1 diabetes mellitus with severe nonproliferative diabetic retinopathy without macular edema, right eye: Secondary | ICD-10-CM | POA: Diagnosis not present

## 2018-05-15 DIAGNOSIS — H3561 Retinal hemorrhage, right eye: Secondary | ICD-10-CM | POA: Diagnosis not present

## 2018-05-15 DIAGNOSIS — E103492 Type 1 diabetes mellitus with severe nonproliferative diabetic retinopathy without macular edema, left eye: Secondary | ICD-10-CM | POA: Diagnosis not present

## 2018-05-15 DIAGNOSIS — M179 Osteoarthritis of knee, unspecified: Secondary | ICD-10-CM | POA: Diagnosis not present

## 2018-05-15 DIAGNOSIS — Z79891 Long term (current) use of opiate analgesic: Secondary | ICD-10-CM | POA: Diagnosis not present

## 2018-05-16 DIAGNOSIS — H01029 Squamous blepharitis unspecified eye, unspecified eyelid: Secondary | ICD-10-CM | POA: Diagnosis not present

## 2018-05-21 DIAGNOSIS — R05 Cough: Secondary | ICD-10-CM | POA: Diagnosis not present

## 2018-05-21 DIAGNOSIS — R5383 Other fatigue: Secondary | ICD-10-CM | POA: Diagnosis not present

## 2018-05-21 DIAGNOSIS — R5381 Other malaise: Secondary | ICD-10-CM | POA: Diagnosis not present

## 2018-05-22 ENCOUNTER — Ambulatory Visit (INDEPENDENT_AMBULATORY_CARE_PROVIDER_SITE_OTHER): Payer: 59 | Admitting: Internal Medicine

## 2018-05-22 ENCOUNTER — Encounter: Payer: Self-pay | Admitting: Internal Medicine

## 2018-05-22 VITALS — BP 132/78 | HR 91 | Ht 64.0 in | Wt 125.0 lb

## 2018-05-22 DIAGNOSIS — R0600 Dyspnea, unspecified: Secondary | ICD-10-CM | POA: Diagnosis not present

## 2018-05-22 DIAGNOSIS — IMO0002 Reserved for concepts with insufficient information to code with codable children: Secondary | ICD-10-CM

## 2018-05-22 DIAGNOSIS — R079 Chest pain, unspecified: Secondary | ICD-10-CM | POA: Diagnosis not present

## 2018-05-22 DIAGNOSIS — E1065 Type 1 diabetes mellitus with hyperglycemia: Secondary | ICD-10-CM | POA: Diagnosis not present

## 2018-05-22 NOTE — Consult Note (Signed)
Cardiology Office Note:    Date:  05/31/2018   ID:  [name], DOB 05/22/1976, MRN [id number]  PCP:  Alejandra Roers, MD  Cardiologist:  No primary care provider on file.  Electrophysiologist:  None   Referring MD: Alejandra Roers, MD   ED f/u for chest pain  History of Present Illness:    [name] is a 42 y.o. female with a hx of asthma, depression, type 1 diabetes, endometriosis, GERD, hyperlipidemia, dyspnea.  She presents today for ED follow-up of chest discomfort.   She describes a chest heaviness that radiates to her teeth, jaw, and left shoulder when exerting herself, improving with rest. She has exertional shortness of breath as well. She also notes a sensation of difficulty breathing as soon as she wakes up in the morning. She will wake up in the morning with blurred vision, and feels that her blood sugar is usually 120-150 mg/dL at that time. She also has palpitations, those can occur separately from the chest pain and shortness of breath.   She denies PND, orthopnea, or leg swelling. Denies syncope or presyncope. Denies dizziness or lightheadedness. Endorses snoring and apnea and has not be evaluated for sleep apnea. Epworth score 0.  She has had PFTs several years ago which showed normal FEV1 but subtle airflow obstruction, afterwhich she saw pulmonary medicine. Smoking cessation was advised. She has smoked since age 46, equivalent to approximately 46 pk/yrs. She continues to smoke about 1 ppd.   She has had insulin dependent diabetes (DM1) since age 30. She has end organ sequela such as neuropathy of the lower extremities and diabetic retinopathy per her report. She does not think she has nephropathy, and her most recent creatinine was normal in October. She is aware that her diet needs adjustment, and that she could participate in aerobic physical activity.  Her mother and maternal grandfather were also type 1 diabetics. She believes her son is as well. MGF had  an MI in his 37s, and her aunt had an MI in her 80s. No sudden cardiac death.   Past Medical History:  Diagnosis Date  . Anxiety state, unspecified 03/26/2009  . ASTHMA 02/02/2007  . DEPRESSION 02/16/2009  . DIABETES MELLITUS, TYPE I 02/02/2007  . Endometriosis   . GERD 12/21/2008  . Hyperlipidemia   . Lesion of vocal cord   . Shortness of breath 09/25/2007  . Vocal fold leukoplakia     Past Surgical History:  Procedure Laterality Date  . ABDOMINAL HYSTERECTOMY  2009   BSO  . DIRECT LARYNGOSCOPY N/A 08/17/2017   Procedure: DIRECT LARYNGOSCOPY WITH OPERATING TELESCOPE WITH BIOPSY;  Surgeon: Helayne Seminole, MD;  Location: [facility];  Service: ENT;  Laterality: N/A;  . NASOPHARYNGEAL BIOPSY Right 08/17/2017   Procedure: NASOPHARYNGEAL BIOPSY RIGHT NASAL LESION;  Surgeon: Helayne Seminole, MD;  Location: [facility];  Service: ENT;  Laterality: Right;    Current Medications: Current Meds  Medication Sig  . albuterol (PROVENTIL HFA;VENTOLIN HFA) 108 (90 Base) MCG/ACT inhaler Inhale 2 puffs into the lungs every 6 (six) hours as needed for wheezing or shortness of breath.  . ALPRAZolam (XANAX) 0.25 MG tablet Take 0.25 mg by mouth 2 (two) times daily.  Marland Kitchen atorvastatin (LIPITOR) 10 MG tablet Take 10 mg by mouth at bedtime.  . Blood Glucose Monitoring Suppl (ACCU-CHEK AVIVA PLUS) W/DEVICE KIT 1 Device by Does not apply route once.  . Cholecalciferol (VITAMIN D3) 50 MCG (2000 UT) capsule Take 2,000 Units by mouth daily.  Marland Kitchen  gabapentin (NEURONTIN) 300 MG capsule Take 900 mg by mouth at bedtime.   Marland Kitchen glucagon 1 MG injection Inject 1 mg into the muscle once as needed.  Marland Kitchen glucose blood (ACCU-CHEK AVIVA PLUS) test strip USE TO TEST BLOOD SUGAR 7 TIMES DAILY, and lancets 7/day. DX E10.41  . HYDROcodone-acetaminophen (NORCO/VICODIN) 5-325 MG tablet Take 1 tablet by mouth every 4 (four) hours as needed for moderate pain.  Marland Kitchen ibuprofen (ADVIL,MOTRIN) 200 MG tablet Take 600-800 mg by mouth every 8 (eight) hours as  needed (for pain.).  . Insulin Pen Needle 31G X 8 MM MISC Use as directed   . meclizine (ANTIVERT) 25 MG tablet Take 1 tablet (25 mg total) by mouth 3 (three) times daily as needed for dizziness.  . metoprolol succinate (TOPROL-XL) 25 MG 24 hr tablet Take 25 mg by mouth daily.  . Multiple Vitamin (MULTIVITAMIN WITH MINERALS) TABS tablet Take 1 tablet by mouth daily.  . mupirocin ointment (BACTROBAN) 2 % Apply to right nasal septum twice daily for 7 days.  Marland Kitchen NOVOLOG 100 UNIT/ML injection INJECT 40 UNITS DAILY PER INSULIN PUMP (MAX DOSAGE)  . omeprazole (PRILOSEC) 40 MG capsule Take 40 mg by mouth daily before breakfast.  . PARoxetine (PAXIL) 40 MG tablet Take 1 tablet (40 mg total) by mouth daily.  Marland Kitchen rOPINIRole (REQUIP) 1 MG tablet Take 1 mg by mouth at bedtime.     Allergies:   Patient has no known allergies.   Social History   Socioeconomic History  . Marital status: Legally Separated    Spouse name: Not on file  . Number of children: 2  . Years of education: Not on file  . Highest education level: Not on file  Occupational History    Employer: UNEMPLOYED  Social Needs  . Financial resource strain: Not on file  . Food insecurity:    Worry: Not on file    Inability: Not on file  . Transportation needs:    Medical: Not on file    Non-medical: Not on file  Tobacco Use  . Smoking status: Current Every Day Smoker    Packs/day: 1.50    Years: 25.00    Pack years: 37.50    Types: Cigarettes  . Smokeless tobacco: Never Used  . Tobacco comment: form given 06/05/14  Substance and Sexual Activity  . Alcohol use: No    Alcohol/week: 0.0 standard drinks  . Drug use: No  . Sexual activity: Not on file  Lifestyle  . Physical activity:    Days per week: Not on file    Minutes per session: Not on file  . Stress: Not on file  Relationships  . Social connections:    Talks on phone: Not on file    Gets together: Not on file    Attends religious service: Not on file    Active  member of club or organization: Not on file    Attends meetings of clubs or organizations: Not on file    Relationship status: Not on file  Other Topics Concern  . Not on file  Social History Narrative   2 children--pt has custody. Husband pays child support.   Daily Caffeine Use-3 2 liters a day   Pt does not get regular exercise.     Family History: The patient's family history includes Diabetes in her maternal aunt, maternal grandfather, and mother; Emphysema in her maternal aunt and maternal uncle; Lung cancer in her maternal aunt. There is no history of Colon cancer, Kidney disease,  or Liver disease.  ROS:   Please see the history of present illness.    All other systems reviewed and are negative.  EKGs/Labs/Other Studies Reviewed:    The following studies were reviewed today:  EKG:  EKG is ordered today.  The ekg ordered today demonstrates sinus rhythm with sinus arrhythmia.  Recent Labs: 06/19/2017: ALT 13 12/17/2017: TSH 1.127 05/10/2018: BUN 7; Creatinine, Ser 0.74; Hemoglobin 13.7; Platelets 246; Potassium 4.4; Sodium 134  Recent Lipid Panel    Component Value Date/Time   CHOL 220 (H) 05/12/2010 1152   TRIG 200.0 (H) 05/12/2010 1152   HDL 49.90 05/12/2010 1152   CHOLHDL 4 05/12/2010 1152   VLDL 40.0 05/12/2010 1152   LDLDIRECT 142.7 05/12/2010 1152    Physical Exam:    VS:  BP 132/78   Pulse 91   Ht '5\' 4"'  (1.626 m)   Wt 125 lb 0.6 oz (56.7 kg)   BMI 21.46 kg/m     Wt Readings from Last 3 Encounters:  05/27/18 125 lb (56.7 kg)  05/22/18 125 lb 0.6 oz (56.7 kg)  05/10/18 130 lb (59 kg)     Constitutional: No acute distress Eyes: pupils equally round and reactive to light, sclera non-icteric, normal conjunctiva and lids ENMT: normal dentition, moist mucous membranes Cardiovascular: regular rhythm, normal rate, no murmurs. S1 and S2 normal. Radial pulses normal bilaterally. No jugular venous distention.  Respiratory: clear to auscultation bilaterally GI :  normal bowel sounds, soft and nontender. No distention.   MSK: extremities warm, well perfused. No edema.  LYMPH: No lymphadenopathy noted of the head and neck NEURO: grossly nonfocal exam, moves all extremities. PSYCH: alert and oriented x 3, normal mood and affect.   ASSESSMENT:    1. Dyspnea, unspecified type   2. Chest pain, unspecified type   3. Diabetes mellitus type 1, uncontrolled, insulin dependent ([place])    PLAN:    1. Dyspnea, unspecified type   2. Chest pain, unspecified type   3. Diabetes mellitus type 1, uncontrolled, insulin dependent ([facility])    Her history of DM1 that is uncontrolled with a last recorded HbA1c of 9.3 in March suggests she is a risk for coronary artery disease. Her CTPE study does not clearly demonstrate the coronary lumens due to cardiac motion/protocol, so it would be reasonable to consider functional testing to better evaluate the coronary arteries. We will obtain a treadmill nuclear stress test. I have asked her to report her symptoms while exercising. I am also aware that we may see balanced ischemia on stress due to long standing diabetes. If stress is equivocal, we will consider an alternate investigation for coronary disease.   I will see her back in 3 months or sooner if needed. We will need to work aggressively on prevention of coronary artery disease with her history of DM1 and smoking.   Medication Adjustments/Labs and Tests Ordered: Current medicines are reviewed at length with the patient today.  Concerns regarding medicines are outlined above.  Orders Placed This Encounter  Procedures  . Myocardial Perfusion Imaging  . EKG 12-Lead   No orders of the defined types were placed in this encounter.   Patient Instructions  Medication Instructions:  Your physician recommends that you continue on your current medications as directed. Please refer to the Current Medication list given to you today.   HOLD Metoprolol the morning of your test  If  you need a refill on your cardiac medications before your next appointment, please call your pharmacy.  Lab work: None ordered   Testing/Procedures: Your physician has requested that you have en exercise stress myoview. For further information please visit [email or web address]. Please follow instruction sheet, as given.    Follow-Up: At [facility], you and your health needs are our priority.  As part of our continuing mission to provide you with exceptional heart care, we have created designated Provider Care Teams.  These Care Teams include your primary Cardiologist (physician) and Advanced Practice Providers (APPs -  Physician Assistants and Nurse Practitioners) who all work together to provide you with the care you need, when you need it.  Your physician recommends that you schedule a follow-up appointment in:3 months with Dr.Raijon Lindfors at the [facility] office   Any Other Special Instructions Will Be Listed Below (If Applicable).   [facility] at [facility] [address], [place], [place] [address] Phone: [phone]  May 22, 2018    [name] DOB: 21-Jun-1976 MRN: [id number] [address] [place] [address]   Dear Ms. Richeson,  You are scheduled for a Myocardial Perfusion Imaging Study on:                  Marland Kitchen  Please arrive 15 minutes prior to your appointment time for registration and insurance purposes.  The test will take approximately 3 to 4 hours to complete; you may bring reading material.  If someone comes with you to your appointment, they will need to remain in the main lobby due to limited space in the testing area. **If you are pregnant or breastfeeding, please notify the nuclear lab prior to your appointment**  How to prepare for your Myocardial Perfusion Test: . Do not eat or drink 3 hours prior to your test, except you may have water. . Do not consume products containing caffeine (regular or decaffeinated)  12 hours prior to your test. (ex: coffee, chocolate, sodas, tea). Do bring a list of your current medications with you.  If not listed below, you may take your medications as normal. . Do not take metoprolol (Lopressor, Toprol) for 24 hours prior to the test.  Bring the medication to your appointment as you may be required to take it once the test is complete. . Do wear comfortable clothes (no dresses or overalls) and walking shoes, tennis shoes preferred (No heels or open toe shoes are allowed). . Do NOT wear cologne, perfume, aftershave, or lotions (deodorant is allowed). . If these instructions are not followed, your test will have to be rescheduled.  Please report to [address], [address] for your test.  If you have questions or concerns about your appointment, you can call the Nuclear Lab at [phone].  If you cannot keep your appointment, please provide 24 hours notification to the Nuclear Lab, to avoid a possible $50 charge to your account.      Signed, Elouise Munroe, MD  05/31/2018 11:03 AM    [place]

## 2018-05-22 NOTE — Patient Instructions (Signed)
Medication Instructions:  Your physician recommends that you continue on your current medications as directed. Please refer to the Current Medication list given to you today.   HOLD Metoprolol the morning of your test  If you need a refill on your cardiac medications before your next appointment, please call your pharmacy.   Lab work: None ordered   Testing/Procedures: Your physician has requested that you have en exercise stress myoview. For further information please visit [email or web address]. Please follow instruction sheet, as given.    Follow-Up: At [facility], you and your health needs are our priority.  As part of our continuing mission to provide you with exceptional heart care, we have created designated Provider Care Teams.  These Care Teams include your primary Cardiologist (physician) and Advanced Practice Providers (APPs -  Physician Assistants and Nurse Practitioners) who all work together to provide you with the care you need, when you need it.  Your physician recommends that you schedule a follow-up appointment in:3 months with Dr.Acharya at the [facility] office   Any Other Special Instructions Will Be Listed Below (If Applicable).   [facility] at [facility] [address], [place], [place] [address] Phone: [phone]  May 22, 2018    [name] DOB: January 16, 1976 MRN: [id number] [address] [place] [address]   Dear [name],  You are scheduled for a Myocardial Perfusion Imaging Study on:                  Marland Kitchen  Please arrive 15 minutes prior to your appointment time for registration and insurance purposes.  The test will take approximately 3 to 4 hours to complete; you may bring reading material.  If someone comes with you to your appointment, they will need to remain in the main lobby due to limited space in the testing area. **If you are pregnant or breastfeeding, please notify the nuclear lab prior  to your appointment**  How to prepare for your Myocardial Perfusion Test: . Do not eat or drink 3 hours prior to your test, except you may have water. . Do not consume products containing caffeine (regular or decaffeinated) 12 hours prior to your test. (ex: coffee, chocolate, sodas, tea). Do bring a list of your current medications with you.  If not listed below, you may take your medications as normal. . Do not take metoprolol (Lopressor, Toprol) for 24 hours prior to the test.  Bring the medication to your appointment as you may be required to take it once the test is complete. . Do wear comfortable clothes (no dresses or overalls) and walking shoes, tennis shoes preferred (No heels or open toe shoes are allowed). . Do NOT wear cologne, perfume, aftershave, or lotions (deodorant is allowed). . If these instructions are not followed, your test will have to be rescheduled.  Please report to [address], [address] for your test.  If you have questions or concerns about your appointment, you can call the Nuclear Lab at [phone].  If you cannot keep your appointment, please provide 24 hours notification to the Nuclear Lab, to avoid a possible $50 charge to your account.

## 2018-05-23 ENCOUNTER — Telehealth (HOSPITAL_COMMUNITY): Payer: Self-pay | Admitting: *Deleted

## 2018-05-23 NOTE — Telephone Encounter (Signed)
Left message on voicemail per DPR in reference to upcoming appointment scheduled on 05/27/18 at 7:15 with detailed instructions given per Myocardial Perfusion Study Information Sheet for the test. LM to arrive 15 minutes early, and that it is imperative to arrive on time for appointment to keep from having the test rescheduled. If you need to cancel or reschedule your appointment, please call the office within 24 hours of your appointment. Failure to do so may result in a cancellation of your appointment, and a $50 no show fee. Phone number given for call back for any questions. [name]

## 2018-05-27 ENCOUNTER — Ambulatory Visit (HOSPITAL_COMMUNITY): Payer: Commercial Managed Care - HMO | Attending: Cardiology

## 2018-05-27 DIAGNOSIS — R079 Chest pain, unspecified: Secondary | ICD-10-CM | POA: Insufficient documentation

## 2018-05-27 DIAGNOSIS — R0600 Dyspnea, unspecified: Secondary | ICD-10-CM | POA: Diagnosis not present

## 2018-05-27 LAB — MYOCARDIAL PERFUSION IMAGING
CHL CUP NUCLEAR SRS: 1
CHL CUP NUCLEAR SSS: 5
CHL CUP RESTING HR STRESS: 81 {beats}/min
CSEPED: 8 min
CSEPEDS: 0 s
CSEPPHR: 162 {beats}/min
Estimated workload: 10.1 METS
LV dias vol: 56 mL (ref 46–106)
LV sys vol: 17 mL
MPHR: 178 {beats}/min
Percent HR: 91 %
SDS: 4
TID: 0.95

## 2018-05-27 MED ORDER — TECHNETIUM TC 99M TETROFOSMIN IV KIT
10.9000 | PACK | Freq: Once | INTRAVENOUS | Status: AC | PRN
  Administered 2018-05-27: 10.9 via INTRAVENOUS
  Filled 2018-05-27: qty 11

## 2018-05-27 MED ORDER — TECHNETIUM TC 99M TETROFOSMIN IV KIT
32.6000 | PACK | Freq: Once | INTRAVENOUS | Status: AC | PRN
  Administered 2018-05-27: 32.6 via INTRAVENOUS
  Filled 2018-05-27: qty 33

## 2018-05-29 ENCOUNTER — Telehealth: Payer: Self-pay

## 2018-05-29 DIAGNOSIS — J383 Other diseases of vocal cords: Secondary | ICD-10-CM | POA: Diagnosis not present

## 2018-05-29 DIAGNOSIS — H938X3 Other specified disorders of ear, bilateral: Secondary | ICD-10-CM | POA: Diagnosis not present

## 2018-05-29 DIAGNOSIS — F17210 Nicotine dependence, cigarettes, uncomplicated: Secondary | ICD-10-CM | POA: Diagnosis not present

## 2018-05-29 NOTE — Telephone Encounter (Signed)
Called to give pt myoview results.lmtcb 

## 2018-05-29 NOTE — Telephone Encounter (Signed)
-----   Message from Elouise Munroe, MD sent at 05/27/2018  3:40 PM EST ----- Normal stress test - we will discuss risk factor modification at her next visit.  Exercise recommendations: Goal of exercising for at least 30 minutes a day, at least 5 times per week.  Please exercise to a moderate exertion.  This means that while exercising it is difficult to speak in full sentences, however you are not so short of breath that you feel you must stop, and not so comfortable that you can carry on a full conversation.  Exertion level should be approximately a 5/10, if 10 is the most exertion you can perform.  Diet recommendations: Recommend a heart healthy diet such as the Mediterranean diet.  This diet consists of plant based foods, healthy fats, lean meats, olive oil.  It suggests limiting the intake of simple carbohydrates such as white breads, pastries, and pastas.  It also limits the amount of red meat, wine, and dairy products such as cheese that one should consume on a daily basis.

## 2018-05-31 ENCOUNTER — Other Ambulatory Visit: Payer: Self-pay

## 2018-05-31 ENCOUNTER — Encounter: Payer: Self-pay | Admitting: *Deleted

## 2018-05-31 ENCOUNTER — Emergency Department
Admission: EM | Admit: 2018-05-31 | Discharge: 2018-05-31 | Disposition: A | Discharge status: Home / Self Care | Payer: Commercial Managed Care - HMO | Attending: Emergency Medicine | Admitting: Emergency Medicine

## 2018-05-31 ENCOUNTER — Emergency Department: Payer: Commercial Managed Care - HMO

## 2018-05-31 DIAGNOSIS — J40 Bronchitis, not specified as acute or chronic: Secondary | ICD-10-CM | POA: Diagnosis not present

## 2018-05-31 DIAGNOSIS — R0789 Other chest pain: Secondary | ICD-10-CM | POA: Diagnosis not present

## 2018-05-31 DIAGNOSIS — R0602 Shortness of breath: Secondary | ICD-10-CM | POA: Diagnosis not present

## 2018-05-31 DIAGNOSIS — E109 Type 1 diabetes mellitus without complications: Secondary | ICD-10-CM | POA: Insufficient documentation

## 2018-05-31 DIAGNOSIS — R05 Cough: Secondary | ICD-10-CM | POA: Diagnosis not present

## 2018-05-31 DIAGNOSIS — R509 Fever, unspecified: Secondary | ICD-10-CM | POA: Diagnosis not present

## 2018-05-31 DIAGNOSIS — Z794 Long term (current) use of insulin: Secondary | ICD-10-CM | POA: Diagnosis not present

## 2018-05-31 DIAGNOSIS — F419 Anxiety disorder, unspecified: Secondary | ICD-10-CM | POA: Diagnosis not present

## 2018-05-31 DIAGNOSIS — R079 Chest pain, unspecified: Secondary | ICD-10-CM | POA: Diagnosis not present

## 2018-05-31 DIAGNOSIS — Z79899 Other long term (current) drug therapy: Secondary | ICD-10-CM | POA: Insufficient documentation

## 2018-05-31 DIAGNOSIS — F17210 Nicotine dependence, cigarettes, uncomplicated: Secondary | ICD-10-CM | POA: Diagnosis not present

## 2018-05-31 LAB — BASIC METABOLIC PANEL
Anion gap: 9 (ref 5–15)
BUN: 7 mg/dL (ref 6–20)
CHLORIDE: 101 mmol/L (ref 98–111)
CO2: 26 mmol/L (ref 22–32)
CREATININE: 0.55 mg/dL (ref 0.44–1.00)
Calcium: 9.1 mg/dL (ref 8.9–10.3)
GFR calc Af Amer: >60 mL/min (ref >60)
Glucose, Bld: 184 mg/dL — ABNORMAL HIGH (ref 70–99)
POTASSIUM: 4.3 mmol/L (ref 3.5–5.1)
SODIUM: 136 mmol/L (ref 135–145)

## 2018-05-31 LAB — CBC
HEMATOCRIT: 39.3 % (ref 36.0–46.0)
HEMOGLOBIN: 13.3 g/dL (ref 12.0–15.0)
MCH: 31.2 pg (ref 26.0–34.0)
MCHC: 33.8 g/dL (ref 30.0–36.0)
MCV: 92.3 fL (ref 80.0–100.0)
Platelets: 197 10*3/uL (ref 150–400)
RBC: 4.26 MIL/uL (ref 3.87–5.11)
RDW: 12.4 % (ref 11.5–15.5)
WBC: 5.5 10*3/uL (ref 4.0–10.5)
nRBC: 0 % (ref 0.0–0.2)

## 2018-05-31 LAB — TROPONIN I: Troponin I: <0.03 ng/mL (ref <0.03)

## 2018-05-31 MED ORDER — ALBUTEROL SULFATE HFA 108 (90 BASE) MCG/ACT IN AERS: 2.0000 | INHALATION_SPRAY | Freq: Four times a day (QID) | RESPIRATORY_TRACT | 2 refills | Status: DC | PRN

## 2018-05-31 MED ORDER — IPRATROPIUM-ALBUTEROL 0.5-2.5 (3) MG/3ML IN SOLN
3.0000 mL | Freq: Once | RESPIRATORY_TRACT | Status: AC
  Administered 2018-05-31: 3 mL via RESPIRATORY_TRACT
  Filled 2018-05-31: qty 3

## 2018-05-31 MED ORDER — AZITHROMYCIN 500 MG PO TABS
500.0000 mg | ORAL_TABLET | Freq: Once | ORAL | Status: AC
  Administered 2018-05-31: 500 mg via ORAL
  Filled 2018-05-31: qty 1

## 2018-05-31 MED ORDER — AZITHROMYCIN 250 MG PO TABS: ORAL_TABLET | ORAL | 0 refills | Status: DC

## 2018-05-31 MED ORDER — PREDNISONE 20 MG PO TABS
40.0000 mg | ORAL_TABLET | Freq: Once | ORAL | Status: DC
  Filled 2018-05-31: qty 2

## 2018-05-31 MED ORDER — PREDNISONE 20 MG PO TABS: 40.0000 mg | ORAL_TABLET | Freq: Every day | ORAL | 0 refills | Status: AC

## 2018-05-31 NOTE — ED Triage Notes (Signed)
Pt to ED reporting continued chest pain since last visit 1.5 weeks ago. Pain is extreme in the mornings and decreases throughout the day. Pt reports feeling as though she can not breath in the morning. Congested cough wit green phlegm. NO fevers.

## 2018-05-31 NOTE — ED Notes (Signed)
Dr. Alfred Levins was made aware that pt would like a dose of Xanax. Dr. Alfred Levins has already told pt she can not prescribe it for her at this time and since she is driving we can not give her a dose here.

## 2018-05-31 NOTE — ED Provider Notes (Signed)
[facility] Emergency Department Provider Note  ____________________________________________  Time seen: Approximately 1:33 PM  I have reviewed the triage vital signs and the nursing notes.   HISTORY  Chief Complaint Chest Pain   HPI [name] is a 42 y.o. female history of anxiety, asthma, smoking, type 1 diabetes who presents for evaluation of chest pain or shortness of breath.  Patient reports 3 weeks of chest pain or shortness of breath.  She reports that she usually wakes up in the morning with shortness of breath.  It is very hard for her to take the first few breaths.  Her chest feels tight and she wheezes.  As the day progresses the pain and shortness of breath go away.  She reports that she stopped smoking 2 days ago and the symptoms are not getting better.  She also has had a cough productive of green sputum.  Has had subjective fevers and chills.  No nausea or vomiting, diarrhea, body aches.  Patient also reports that her anxiety has been severe.  She is on Xanax for it.  She is requesting a stronger prescription for anxiety.  She is very concerned because she was seen here 3 weeks ago for the same complaint and was told she had a pulmonary nodule in her lung and she is worried she will have lung cancer.  Past Medical History:  Diagnosis Date  . Anxiety state, unspecified 03/26/2009  . ASTHMA 02/02/2007  . DEPRESSION 02/16/2009  . DIABETES MELLITUS, TYPE I 02/02/2007  . Endometriosis   . GERD 12/21/2008  . Hyperlipidemia   . Lesion of vocal cord   . Shortness of breath 09/25/2007  . Vocal fold leukoplakia     Patient Active Problem List   Diagnosis Date Noted  . Diabetic peripheral neuropathy ([place]) 01/11/2018  . Hyperreflexia 11/29/2017  . Low back pain with left-sided sciatica 11/29/2017  . Peripheral polyneuropathy 11/29/2017  . Multinodular goiter 07/29/2015  . Type 1 diabetes mellitus with neurological manifestations, uncontrolled ([place])  01/27/2014  . Restless leg 11/07/2013  . Current tobacco use 11/07/2013  . Triggering of digit 08/13/2013  . Hematuria 03/26/2012  . Chronic obstructive pulmonary disease ([place]) 02/15/2012  . CN (constipation) 01/01/2012  . H/O respiratory system disease 10/04/2011  . Cholesteatoma 09/11/2011  . Allergy to environmental factors 09/11/2011  . Anxiety 05/01/2011  . Myalgia 11/10/2010  . AIRWAY INFLAMMATION 06/08/2010  . Nonspecific (abnormal) findings on radiological and other examination of body structure 05/12/2010  . [place] LUNG FIELD 05/12/2010  . Type 1 diabetes mellitus ([place]) 04/01/2010  . Acid reflux 04/01/2010  . Absence of bladder continence 04/01/2010  . VULVAR ABSCESS 03/18/2010  . UTI 03/09/2010  . INSOMNIA 02/18/2010  . FOLLICULITIS 17/40/8144  . ANXIETY STATE, UNSPECIFIED 03/26/2009  . NAUSEA 02/19/2009  . DEPRESSION 02/16/2009  . GERD 12/21/2008  . ASTHMATIC BRONCHITIS, ACUTE 08/12/2008  . ACUTE MAXILLARY SINUSITIS 05/04/2008  . MOLE 01/29/2008  . Diarrhea 10/16/2007  . SMOKER 09/25/2007  . SHORTNESS OF BREATH 09/25/2007  . ASTHMA 02/02/2007    Past Surgical History:  Procedure Laterality Date  . ABDOMINAL HYSTERECTOMY  2009   BSO  . DIRECT LARYNGOSCOPY N/A 08/17/2017   Procedure: DIRECT LARYNGOSCOPY WITH OPERATING TELESCOPE WITH BIOPSY;  Surgeon: Helayne Seminole, MD;  Location: [facility];  Service: ENT;  Laterality: N/A;  . NASOPHARYNGEAL BIOPSY Right 08/17/2017   Procedure: NASOPHARYNGEAL BIOPSY RIGHT NASAL LESION;  Surgeon: Helayne Seminole, MD;  Location: [facility] OR;  Service: ENT;  Laterality: Right;    Prior to Admission medications   Medication Sig Start Date End Date Taking? Authorizing Provider  albuterol (PROVENTIL HFA;VENTOLIN HFA) 108 (90 Base) MCG/ACT inhaler Inhale 2 puffs into the lungs every 6 (six) hours as needed for wheezing or shortness of breath. 05/12/16   Earleen Newport, MD  ALPRAZolam Duanne Moron) 0.25 MG  tablet Take 0.25 mg by mouth 2 (two) times daily. 05/21/18   [provider]  atorvastatin (LIPITOR) 10 MG tablet Take 10 mg by mouth at bedtime.    [provider]  Blood Glucose Monitoring Suppl (ACCU-CHEK AVIVA PLUS) W/DEVICE KIT 1 Device by Does not apply route once. 01/27/14   Renato Shin, MD  Cholecalciferol (VITAMIN D3) 50 MCG (2000 UT) capsule Take 2,000 Units by mouth daily.    [provider]  clonazePAM (KLONOPIN) 1 MG tablet Take 1 mg by mouth at bedtime as needed for anxiety.     [provider]  DULoxetine (CYMBALTA) 60 MG capsule Take 1 capsule (60 mg total) by mouth daily. Patient not taking: Reported on 05/22/2018 11/29/17   Marcial Pacas, MD  gabapentin (NEURONTIN) 300 MG capsule Take 900 mg by mouth at bedtime.  12/03/13   [provider]  glucagon 1 MG injection Inject 1 mg into the muscle once as needed. 07/29/15   Renato Shin, MD  glucose blood (ACCU-CHEK AVIVA PLUS) test strip USE TO TEST BLOOD SUGAR 7 TIMES DAILY, and lancets 7/day. DX E10.41 07/29/15   Renato Shin, MD  HYDROcodone-acetaminophen (NORCO/VICODIN) 5-325 MG tablet Take 1 tablet by mouth every 4 (four) hours as needed for moderate pain. 08/11/17 08/11/18  Triplett, Johnette Abraham B, FNP  ibuprofen (ADVIL,MOTRIN) 200 MG tablet Take 600-800 mg by mouth every 8 (eight) hours as needed (for pain.).    [provider]  Insulin Pen Needle 31G X 8 MM MISC Use as directed     [provider]  meclizine (ANTIVERT) 25 MG tablet Take 1 tablet (25 mg total) by mouth 3 (three) times daily as needed for dizziness. 10/10/17   Darel Hong, MD  meloxicam (MOBIC) 7.5 MG tablet Take 1 tablet (7.5 mg total) by mouth daily. Patient not taking: Reported on 05/22/2018 04/03/18 04/03/19  Johnn Hai, PA-C  metoprolol succinate (TOPROL-XL) 25 MG 24 hr tablet Take 25 mg by mouth daily. 05/21/18   [provider]  metroNIDAZOLE (FLAGYL) 500 MG tablet Take 1 tablet (500 mg total)  by mouth 3 (three) times daily. Patient not taking: Reported on 05/22/2018 11/27/17   Yetta Flock, MD  Multiple Vitamin (MULTIVITAMIN WITH MINERALS) TABS tablet Take 1 tablet by mouth daily.    [provider]  mupirocin ointment (BACTROBAN) 2 % Apply to right nasal septum twice daily for 7 days. 08/17/17 08/17/18  Marcellino, San Jetty, MD  NOVOLOG 100 UNIT/ML injection INJECT 40 UNITS DAILY PER INSULIN PUMP (MAX DOSAGE) 05/15/18   Renato Shin, MD  omeprazole (PRILOSEC) 40 MG capsule Take 40 mg by mouth daily before breakfast. 08/05/17   [provider]  ondansetron (ZOFRAN) 4 MG tablet Take 1 tablet (4 mg total) by mouth every 8 (eight) hours as needed for nausea or vomiting. Patient not taking: Reported on 05/22/2018 06/19/17   Yetta Flock, MD  PARoxetine (PAXIL) 40 MG tablet Take 1 tablet (40 mg total) by mouth daily. 11/10/13   Renato Shin, MD  rOPINIRole (REQUIP) 0.5 MG tablet TAKE THREE TABLETS BY MOUTH AT BEDTIME  Patient not taking: Reported on 05/22/2018 08/20/13   Renato Shin, MD  rOPINIRole (REQUIP) 1 MG tablet Take 1 mg by mouth at bedtime.    [provider]    Allergies Patient has no known allergies.  Family History  Problem Relation Age of Onset  . Diabetes Mother   . Diabetes Maternal Aunt   . Emphysema Maternal Aunt   . Emphysema Maternal Uncle   . Diabetes Maternal Grandfather   . Lung cancer Maternal Aunt   . Colon cancer Neg Hx   . Kidney disease Neg Hx   . Liver disease Neg Hx     Social History Social History   Tobacco Use  . Smoking status: Current Every Day Smoker    Packs/day: 1.50    Years: 25.00    Pack years: 37.50    Types: Cigarettes  . Smokeless tobacco: Never Used  . Tobacco comment: form given 06/05/14  Substance Use Topics  . Alcohol use: No    Alcohol/week: 0.0 standard drinks  . Drug use: No    Review of Systems  Constitutional: + subjective fever and chills Eyes: Negative for visual  changes. ENT: Negative for sore throat. Neck: No neck pain  Cardiovascular: + chest tightness Respiratory: + shortness of breath, cough Gastrointestinal: Negative for abdominal pain, vomiting or diarrhea. Genitourinary: Negative for dysuria. Musculoskeletal: Negative for back pain. Skin: Negative for rash. Neurological: Negative for headaches, weakness or numbness. Psych: No SI or HI. + anxiety  ____________________________________________   PHYSICAL EXAM:  VITAL SIGNS: ED Triage Vitals [05/31/18 1222]  Enc Vitals Group     BP (!) 151/92     Pulse Rate 89     Resp 16     Temp 98.2 F (36.8 C)     Temp Source Oral     SpO2 98 %     Weight 125 lb 0.4 oz (56.7 kg)     Height _0  (1.626 m)     Head Circumference      Peak Flow      Pain Score 8     Pain Loc      Pain Edu?      Excl. in [place]?     Constitutional: Alert and oriented, appears anxious, crying, no other distress. HEENT:      Head: Normocephalic and atraumatic.         Eyes: Conjunctivae are normal. Sclera is non-icteric.       Mouth/Throat: Mucous membranes are moist.       Neck: Supple with no signs of meningismus. Cardiovascular: Regular rate and rhythm. No murmurs, gallops, or rubs. 2+ symmetrical distal pulses are present in all extremities. No JVD. Respiratory: Normal respiratory effort. Lungs are clear to auscultation bilaterally. No wheezes, crackles, or rhonchi.  Gastrointestinal: Soft, non tender, and non distended with positive bowel sounds. No rebound or guarding. Musculoskeletal: Nontender with normal range of motion in all extremities. No edema, cyanosis, or erythema of extremities. Neurologic: Normal speech and language. Face is symmetric. Moving all extremities. No gross focal neurologic deficits are appreciated. Skin: Skin is warm, dry and intact. No rash noted. Psychiatric: Mood and affect are normal. Speech and behavior are normal.  ____________________________________________   LABS (all  labs ordered are listed, but only abnormal results are displayed)  Labs Reviewed  BASIC METABOLIC PANEL - Abnormal; Notable for the following components:      Result Value   Glucose, Bld 184 (*)    All other components within normal  limits  CBC  TROPONIN I   ____________________________________________  EKG  ED ECG REPORT I, Rudene Re, the attending physician, personally viewed and interpreted this ECG.  Normal sinus rhythm, rate of 86, normal intervals, normal axis, no ST elevations or depressions.  Normal EKG. ____________________________________________  RADIOLOGY  I have personally reviewed the images performed during this visit and I agree with the Radiologist's read.   Interpretation by Radiologist:  Dg Chest 2 View  Result Date: 05/31/2018 CLINICAL DATA:  Persistent chest pain since last visit 10 days ago. Pain is greatest in the mornings and decreases during the day. Difficulty breathing with congested cough productive of per unit sputum. History of asthma-COPD. Current smoker. EXAM: CHEST - 2 VIEW COMPARISON:  Chest x-ray and chest CT scan of May 10, 2018 FINDINGS: The lungs remain mildly hyperinflated. The interstitial markings are coarse. There is no alveolar infiltrate or pleural effusion. Subtle coarse densities in the apices persist but have been evaluated on the recent CT scan. The heart and pulmonary vascularity are normal. The mediastinum is normal in width. The trachea is midline. The bony thorax exhibits no acute abnormality. IMPRESSION: There is no acute pneumonia nor CHF. Mildly increased lung markings bilaterally are compatible with reactive airway disease and/or COPD in the patient's smoking history. Electronically Signed   By: David  Martinique M.D.   On: 05/31/2018 12:49     ____________________________________________   PROCEDURES  Procedure(s) performed: None Procedures Critical Care performed:   None ____________________________________________   INITIAL IMPRESSION / ASSESSMENT AND PLAN / ED COURSE  42 y.o. female history of anxiety, asthma, smoking, type 1 diabetes who presents for evaluation of chest tightness, shortness of breath, wheezing, and productive cough.  Patient was seen here 3 weeks ago for same illness with a negative CT angiogram.  She is a smoker and has a history of asthma therefore presentation is most likely concerning for bronchitis.  She has no evidence of pneumonia on her chest x-ray.  Patient is also very concerned about the pulmonary nodules found on CT done 3 weeks ago and she is very anxious about having lung cancer.  I explained to her that she needs repeat CT in 6 to 9 months to make sure these nodules are not increasing in size but at this time there is no indication that these are cancer.  Patient also requesting a stronger prescription for her anxiety.  I recommended that she discuss that with her primary care doctor or her psychiatrist as I do not feel comfortable changing her anxiety medication in the emergency room.  She has no SI or HI, no indication for IVC.  Her labs showed no acute findings.  Her troponin is negative.  With symptoms ongoing for 3 weeks do not believe patient warrants a second troponin.  Her EKG shows no evidence of ischemia.  Will treat with prednisone, Z-Pak, and albuterol.  Recommend close follow-up with her primary care doctor.      As part of my medical decision making, I reviewed the following data within the [place] notes reviewed and incorporated, Labs reviewed , EKG interpreted , Old EKG reviewed, Old chart reviewed, Radiograph reviewed , Notes from prior ED visits and [place] Controlled Substance Database    Pertinent labs & imaging results that were available during my care of the patient were reviewed by me and considered in my medical decision making (see chart for  details).    ____________________________________________   FINAL CLINICAL IMPRESSION(S) / ED  DIAGNOSES  Final diagnoses:  Bronchitis      NEW MEDICATIONS STARTED DURING THIS VISIT:  ED Discharge Orders    None       Note:  This document was prepared using Dragon voice recognition software and may include unintentional dictation errors.    Rudene Re, MD 05/31/18 1341

## 2018-06-05 ENCOUNTER — Ambulatory Visit: Payer: Medicaid Other | Admitting: Nurse Practitioner

## 2018-06-06 DIAGNOSIS — M25512 Pain in left shoulder: Secondary | ICD-10-CM | POA: Diagnosis not present

## 2018-06-10 DIAGNOSIS — R52 Pain, unspecified: Secondary | ICD-10-CM | POA: Diagnosis not present

## 2018-06-10 DIAGNOSIS — J9809 Other diseases of bronchus, not elsewhere classified: Secondary | ICD-10-CM | POA: Diagnosis not present

## 2018-06-10 DIAGNOSIS — M25512 Pain in left shoulder: Secondary | ICD-10-CM | POA: Diagnosis not present

## 2018-06-10 DIAGNOSIS — Z79891 Long term (current) use of opiate analgesic: Secondary | ICD-10-CM | POA: Diagnosis not present

## 2018-06-11 ENCOUNTER — Ambulatory Visit: Payer: 59 | Admitting: Cardiology

## 2018-06-17 ENCOUNTER — Ambulatory Visit: Payer: 59 | Admitting: Family Medicine

## 2018-07-01 DIAGNOSIS — E1065 Type 1 diabetes mellitus with hyperglycemia: Secondary | ICD-10-CM | POA: Diagnosis not present

## 2018-07-01 DIAGNOSIS — E782 Mixed hyperlipidemia: Secondary | ICD-10-CM | POA: Diagnosis not present

## 2018-07-01 DIAGNOSIS — J202 Acute bronchitis due to streptococcus: Secondary | ICD-10-CM | POA: Diagnosis not present

## 2018-07-01 DIAGNOSIS — Z79891 Long term (current) use of opiate analgesic: Secondary | ICD-10-CM | POA: Diagnosis not present

## 2018-07-01 DIAGNOSIS — G629 Polyneuropathy, unspecified: Secondary | ICD-10-CM | POA: Diagnosis not present

## 2018-07-03 ENCOUNTER — Ambulatory Visit: Payer: 59 | Admitting: Family Medicine

## 2018-07-07 ENCOUNTER — Other Ambulatory Visit: Payer: Self-pay | Admitting: Endocrinology

## 2018-07-15 DIAGNOSIS — J383 Other diseases of vocal cords: Secondary | ICD-10-CM | POA: Diagnosis not present

## 2018-07-15 DIAGNOSIS — R05 Cough: Secondary | ICD-10-CM | POA: Diagnosis not present

## 2018-07-15 DIAGNOSIS — H938X3 Other specified disorders of ear, bilateral: Secondary | ICD-10-CM | POA: Diagnosis not present

## 2018-07-18 DIAGNOSIS — J202 Acute bronchitis due to streptococcus: Secondary | ICD-10-CM | POA: Diagnosis not present

## 2018-07-23 DIAGNOSIS — G8929 Other chronic pain: Secondary | ICD-10-CM | POA: Diagnosis not present

## 2018-07-26 ENCOUNTER — Ambulatory Visit (INDEPENDENT_AMBULATORY_CARE_PROVIDER_SITE_OTHER): Payer: 59 | Admitting: Pulmonary Disease

## 2018-07-26 ENCOUNTER — Encounter: Payer: Self-pay | Admitting: Pulmonary Disease

## 2018-07-26 VITALS — BP 126/64 | HR 95 | Ht 64.0 in | Wt 126.0 lb

## 2018-07-26 DIAGNOSIS — R0600 Dyspnea, unspecified: Secondary | ICD-10-CM

## 2018-07-26 MED ORDER — TIOTROPIUM BROMIDE MONOHYDRATE 2.5 MCG/ACT IN AERS: 2.0000 | INHALATION_SPRAY | Freq: Every day | RESPIRATORY_TRACT | 0 refills | Status: DC

## 2018-07-26 NOTE — Patient Instructions (Addendum)
Shortness of breath Suspected Asthma COPD overlap syndrome --We will order pulmonary function test to evaluate your breathing --After your breathing test, you will be seen by NP to determine if additional inhaler needed (eg ICS) --START Spiriva Respimat 2.5 mcg 2 puffs daily --CONTINUE Albuterol inhaler 2 puffs every 4 hours as needed for shortness of breath or wheezing   Follow-up with me in 3 months

## 2018-07-26 NOTE — Progress Notes (Addendum)
Synopsis: Referred in 07/26/18 for asthma  Subjective:   PATIENT ID: [name] GENDER: female DOB: 10-04-1975, MRN: [id number]   HPI  Chief Complaint  Patient presents with  . Consult    c.pain and prod cough upon waking.  Hx asthma / Nov CXR [place] and told inflammation   [name] is a 43 year old female active smoker with hx of asthma who presents for "abnormal CXR" with inflammation.  She reports long-standing hx of chronic productive cough with sputum that will range in color from tan to yellow-green. Associated with shortness of breath. Occasional wheezing. Symptoms have gradually worsened in the last few months when she was seen in November 2019. Worsens with smoking activity and cleaning the house. Albuterol does help. States she cannot take deep inspirations due to pain/tightness located bilaterally on her upper chest and sternum area.  She reports husband states she snores and struggles to breath when she is sleeping. He may have even seen her stop breathing a few times. When she wakes up, she has chest pain. Denies morning headaches.  ACT 16   Social History: Active smoker. Started when 13 years. 2ppd x 20 years. Currently 1ppd.   Environmental exposures: Denies any exposures.  I have personally reviewed patient's past medical/family/social history, allergies, current medications.  Past Medical History:  Diagnosis Date  . Anxiety state, unspecified 03/26/2009  . ASTHMA 02/02/2007  . DEPRESSION 02/16/2009  . DIABETES MELLITUS, TYPE I 02/02/2007  . Endometriosis   . GERD 12/21/2008  . Hyperlipidemia   . Lesion of vocal cord   . Shortness of breath 09/25/2007  . Vocal fold leukoplakia      Family History  Problem Relation Age of Onset  . Diabetes Mother   . Diabetes Maternal Aunt   . Emphysema Maternal Aunt   . Emphysema Maternal Uncle   . Diabetes Maternal Grandfather   . Lung cancer Maternal Aunt   . Colon cancer Neg Hx   . Kidney disease  Neg Hx   . Liver disease Neg Hx      Social History   Occupational History    Employer: UNEMPLOYED  Tobacco Use  . Smoking status: Current Every Day Smoker    Packs/day: 1.50    Years: 25.00    Pack years: 37.50    Types: Cigarettes  . Smokeless tobacco: Never Used  . Tobacco comment: 1PPD as of 07/2018  Substance and Sexual Activity  . Alcohol use: No    Alcohol/week: 0.0 standard drinks  . Drug use: No  . Sexual activity: Not on file    No Known Allergies   Outpatient Medications Prior to Visit  Medication Sig Dispense Refill  . albuterol (PROVENTIL HFA;VENTOLIN HFA) 108 (90 Base) MCG/ACT inhaler Inhale 2 puffs into the lungs every 6 (six) hours as needed for wheezing or shortness of breath. 1 Inhaler 2  . ALPRAZolam (XANAX) 0.25 MG tablet Take 0.25 mg by mouth 3 (three) times daily as needed.     Marland Kitchen atorvastatin (LIPITOR) 10 MG tablet Take 10 mg by mouth at bedtime.    Marland Kitchen azithromycin (ZITHROMAX) 250 MG tablet Take 1 a day for 4 days 4 each 0  . Blood Glucose Monitoring Suppl (ACCU-CHEK AVIVA PLUS) W/DEVICE KIT 1 Device by Does not apply route once. 1 kit 0  . Cholecalciferol (VITAMIN D3) 50 MCG (2000 UT) capsule Take 2,000 Units by mouth daily.    . clonazePAM (KLONOPIN) 1 MG tablet Take 1 mg by mouth  at bedtime as needed for anxiety.     . gabapentin (NEURONTIN) 300 MG capsule Take by mouth 3 (three) times daily. As needed    . glucose blood (ACCU-CHEK AVIVA PLUS) test strip USE TO TEST BLOOD SUGAR 7 TIMES DAILY, and lancets 7/day. DX E10.41 250 each 11  . HYDROcodone-acetaminophen (NORCO/VICODIN) 5-325 MG tablet Take 1 tablet by mouth every 4 (four) hours as needed for moderate pain. 20 tablet 0  . Insulin Pen Needle 31G X 8 MM MISC Use as directed     . metoprolol succinate (TOPROL-XL) 25 MG 24 hr tablet Take 25 mg by mouth daily.    . metroNIDAZOLE (FLAGYL) 500 MG tablet Take 1 tablet (500 mg total) by mouth 3 (three) times daily. 30 tablet 0  . Multiple Vitamin  (MULTIVITAMIN WITH MINERALS) TABS tablet Take 1 tablet by mouth daily.    Marland Kitchen NOVOLOG 100 UNIT/ML injection INJECT 40 UNITS DAILY PER INSULIN PUMP (MAX DOSAGE) 10 mL 0  . omeprazole (PRILOSEC) 40 MG capsule Take 40 mg by mouth daily before breakfast.  2  . ondansetron (ZOFRAN) 4 MG tablet Take 1 tablet (4 mg total) by mouth every 8 (eight) hours as needed for nausea or vomiting. 30 tablet 1  . PARoxetine (PAXIL) 40 MG tablet Take 1 tablet (40 mg total) by mouth daily. (Patient taking differently: Take 10 mg by mouth daily. ) 30 tablet 0  . rOPINIRole (REQUIP) 1 MG tablet Take 1 mg by mouth at bedtime.    . DULoxetine (CYMBALTA) 60 MG capsule Take 1 capsule (60 mg total) by mouth daily. 30 capsule 12  . mupirocin ointment (BACTROBAN) 2 % Apply to right nasal septum twice daily for 7 days. 22 g 0  . rOPINIRole (REQUIP) 0.5 MG tablet TAKE THREE TABLETS BY MOUTH AT BEDTIME 90 tablet 1  . glucagon 1 MG injection Inject 1 mg into the muscle once as needed. (Patient not taking: Reported on 07/26/2018) 1 each 12  . ibuprofen (ADVIL,MOTRIN) 200 MG tablet Take 600-800 mg by mouth every 8 (eight) hours as needed (for pain.).    Marland Kitchen meclizine (ANTIVERT) 25 MG tablet Take 1 tablet (25 mg total) by mouth 3 (three) times daily as needed for dizziness. (Patient not taking: Reported on 07/26/2018) 30 tablet 0  . meloxicam (MOBIC) 7.5 MG tablet Take 1 tablet (7.5 mg total) by mouth daily. (Patient not taking: Reported on 07/26/2018) 30 tablet 0   No facility-administered medications prior to visit.     Review of Systems  Constitutional: Negative for chills, diaphoresis, fever, malaise/fatigue and weight loss.  HENT: Negative for congestion and sore throat.   Respiratory: Positive for cough, sputum production and shortness of breath. Negative for hemoptysis and wheezing.   Cardiovascular: Positive for chest pain and PND. Negative for palpitations, orthopnea and leg swelling.  Gastrointestinal: Negative for abdominal  pain, heartburn and nausea.  Genitourinary: Negative for frequency.  Musculoskeletal: Negative for myalgias.  Skin: Negative for rash.  Neurological: Negative for dizziness, weakness and headaches.  Endo/Heme/Allergies: Does not bruise/bleed easily.     Objective:   Vitals:   07/26/18 1355  BP: 126/64  Pulse: 95  SpO2: 99%  Weight: 126 lb (57.2 kg)  Height: _0  (1.626 m)   SpO2: 99 % O2 Device: None (Room air)  Physical Exam General: Well-appearing, no acute distress HENT: [place], AT, OP clear, MMM Eyes: EOMI, no scleral icterus Respiratory: Clear to auscultation bilaterally.  No crackles, wheezing or rales Cardiovascular: RRR, -M/R/G, no  JVD GI: BS+, soft, nontender Extremities:-Edema,-tenderness Neuro: AAO x4, CNII-XII grossly intact Skin: Intact, no rashes or bruising Psych: Normal mood, normal affect  Chest imaging: CXR 05/31/18- No infiltrate, effusion or edema CTA Chest Aorta 05/10/18- Apical scarring bilaterally. No evidence of RUL nodular opacity previously seen on last CXR. 1-2 cm nodular opacity in LUL present.  PFT: None  Imaging, labs and test noted above have been reviewed independently by me.    Assessment & Plan:   Shortness of breath  Discussion: 43 year old female active smoker with hx of asthma who presents with gradually worsening productive cough x 3 months. Associated with shortness of breath and occasional wheezing.  Shortness of breath Suspected Asthma COPD overlap syndrome --We will order pulmonary function test to evaluate your breathing --START Spiriva Respimat 2.5 mcg 2 puffs daily --CONTINUE Albuterol inhaler 2 puffs every 4 hours as needed for shortness of breath or wheezing --After your breathing test, you will be seen by NP to determine if additional inhaler needed (eg ICS).  **If PFTs show obstruction present, please refill Spiriva +/- add ICS/LABA if indicated**  Tobacco abuse Patient is an active smoker. We discussed smoking  cessation for 5 minutes. We discussed triggers and stressors and ways to deal with them. We discussed barriers to continued smoking and benefits of smoking cessation. Provided patient with information cessation techniques and interventions including [place] quitline.  Suspected OSA At next visit, will formally assess with Epworth Sleepiness Scale and order home sleep study when patient is less symptomatic with the above issues  Orders Placed This Encounter  Procedures  . Pulmonary Function Test    Standing Status:   Future    Standing Expiration Date:   07/27/2019    Order Specific Question:   Where should this test be performed?    Answer:   [facility]    Order Specific Question:   Full PFT: includes the following: basic spirometry, spirometry pre & post bronchodilator, diffusion capacity (DLCO), lung volumes    Answer:   Full PFT    Order Specific Question:   MIP/MEP    Answer:   No    Order Specific Question:   6 minute walk    Answer:   No    Order Specific Question:   ABG    Answer:   No    Order Specific Question:   Diffusion capacity (DLCO)    Answer:   Yes    Order Specific Question:   Lung volumes    Answer:   Yes    Order Specific Question:   Methacholine challenge    Answer:   No   Meds ordered this encounter  Medications  . Tiotropium Bromide Monohydrate (SPIRIVA RESPIMAT) 2.5 MCG/ACT AERS    Sig: Inhale 2 puffs into the lungs daily for 1 day.    Dispense:  1 Inhaler    Refill:  0    Order Specific Question:   Lot Number?    Answer:   [id number]    Order Specific Question:   Expiration Date?    Answer:   08/26/2018    Order Specific Question:   Quantity    Answer:   1    Return in about 3 months (around 10/25/2018).  Jacqueline Delapena Rodman Pickle, MD [facility] 07/29/2018 9:29 PM  Personal pager: [phone] If unanswered, please page CCM On-call: [phone]

## 2018-07-29 ENCOUNTER — Encounter: Payer: Self-pay | Admitting: Pulmonary Disease

## 2018-07-29 DIAGNOSIS — M13861 Other specified arthritis, right knee: Secondary | ICD-10-CM | POA: Diagnosis not present

## 2018-07-29 DIAGNOSIS — M13862 Other specified arthritis, left knee: Secondary | ICD-10-CM | POA: Diagnosis not present

## 2018-08-02 ENCOUNTER — Encounter: Payer: Self-pay | Admitting: Family Medicine

## 2018-08-02 ENCOUNTER — Ambulatory Visit: Payer: 59 | Admitting: Family Medicine

## 2018-08-02 VITALS — BP 128/72 | HR 96 | Temp 98.1°F | Ht 64.0 in | Wt 128.0 lb

## 2018-08-02 DIAGNOSIS — Z23 Encounter for immunization: Secondary | ICD-10-CM

## 2018-08-02 DIAGNOSIS — E1049 Type 1 diabetes mellitus with other diabetic neurological complication: Secondary | ICD-10-CM | POA: Diagnosis not present

## 2018-08-02 DIAGNOSIS — L309 Dermatitis, unspecified: Secondary | ICD-10-CM | POA: Diagnosis not present

## 2018-08-02 DIAGNOSIS — IMO0002 Reserved for concepts with insufficient information to code with codable children: Secondary | ICD-10-CM

## 2018-08-02 DIAGNOSIS — R0789 Other chest pain: Secondary | ICD-10-CM

## 2018-08-02 DIAGNOSIS — E1065 Type 1 diabetes mellitus with hyperglycemia: Secondary | ICD-10-CM

## 2018-08-02 HISTORY — DX: Other chest pain: R07.89

## 2018-08-02 HISTORY — DX: Dermatitis, unspecified: L30.9

## 2018-08-02 MED ORDER — TRIAMCINOLONE ACETONIDE 0.1 % EX CREA: 1.0000 "application " | TOPICAL_CREAM | Freq: Two times a day (BID) | CUTANEOUS | 0 refills | Status: DC

## 2018-08-02 NOTE — Assessment & Plan Note (Signed)
-  No mass palpated over tender area.  Given location, this is likely more of a costochondritis.  Recommend trying ice/heat for comfort.  May try anti-inflammatory as well if needed.

## 2018-08-02 NOTE — Progress Notes (Signed)
[name] - 43 y.o. female MRN [id number]  Date of birth: [birth year]  Subjective Chief Complaint  Patient presents with  . Mass    mass near her chest/rib bone-tender to the touch. Rash ongoing for one month on her left lower abdomen     HPI [name] is a 43 y.o. female with history of T1DM with neuropathy, asthma, HLD, depression and anxiety, and nicotine dependence here today to establish care with new pcp. She has concern of rash and tender nodule over R ribs.  She reports following with Dr. [name] for management of diabetes but has not been seen in several months.  She also has upcoming appt with pulmonology for evaluation of COPD in addition to her asthma.    -Rash: Scaly, dry appearing patch on L abdominal wall.  Denies itching or pain.  She has not tried anything for treatment.    -Rib nodule:  Rib nodule present for a couple of years with pain that comes and goes, more tender recently.  Denies any change in sizes, redness or drainage.  She has not tried anything for treatment.    ROS:  A comprehensive ROS was completed and negative except as noted per HPI  No Known Allergies  Past Medical History:  Diagnosis Date  . Anxiety state, unspecified 03/26/2009  . ASTHMA 02/02/2007  . DEPRESSION 02/16/2009  . DIABETES MELLITUS, TYPE I 02/02/2007  . Endometriosis   . GERD 12/21/2008  . Hyperlipidemia   . Lesion of vocal cord   . Shortness of breath 09/25/2007  . Vocal fold leukoplakia     Past Surgical History:  Procedure Laterality Date  . ABDOMINAL HYSTERECTOMY  2009   BSO  . DIRECT LARYNGOSCOPY N/A 08/17/2017   Procedure: DIRECT LARYNGOSCOPY WITH OPERATING TELESCOPE WITH BIOPSY;  Surgeon: Helayne Seminole, MD;  Location: [place];  Service: ENT;  Laterality: N/A;  . NASOPHARYNGEAL BIOPSY Right 08/17/2017   Procedure: NASOPHARYNGEAL BIOPSY RIGHT NASAL LESION;  Surgeon: Helayne Seminole, MD;  Location: [place];  Service: ENT;  Laterality: Right;    Social  History   Socioeconomic History  . Marital status: Legally Separated    Spouse name: Not on file  . Number of children: 2  . Years of education: Not on file  . Highest education level: Not on file  Occupational History    Employer: UNEMPLOYED  Social Needs  . Financial resource strain: Not on file  . Food insecurity:    Worry: Not on file    Inability: Not on file  . Transportation needs:    Medical: Not on file    Non-medical: Not on file  Tobacco Use  . Smoking status: Current Every Day Smoker    Packs/day: 1.50    Years: 25.00    Pack years: 37.50    Types: Cigarettes  . Smokeless tobacco: Never Used  . Tobacco comment: 1PPD as of 07/2018  Substance and Sexual Activity  . Alcohol use: No    Alcohol/week: 0.0 standard drinks  . Drug use: No  . Sexual activity: Not on file  Lifestyle  . Physical activity:    Days per week: Not on file    Minutes per session: Not on file  . Stress: Not on file  Relationships  . Social connections:    Talks on phone: Not on file    Gets together: Not on file    Attends religious service: Not on file    Active member of club or  organization: Not on file    Attends meetings of clubs or organizations: Not on file    Relationship status: Not on file  Other Topics Concern  . Not on file  Social History Narrative   2 children--pt has custody. Husband pays child support.   Daily Caffeine Use-3 2 liters a day   Pt does not get regular exercise.    Family History  Problem Relation Age of Onset  . Diabetes Mother   . Diabetes Maternal Aunt   . Emphysema Maternal Aunt   . Emphysema Maternal Uncle   . Diabetes Maternal Grandfather   . Lung cancer Maternal Aunt   . Colon cancer Neg Hx   . Kidney disease Neg Hx   . Liver disease Neg Hx     Health Maintenance  Topic Date Due  . PNEUMOCOCCAL POLYSACCHARIDE VACCINE AGE 41-64 HIGH RISK  04/14/1978  . TETANUS/TDAP  07/18/2009  . PAP SMEAR-Modifier  02/17/2012  . URINE MICROALBUMIN   12/30/2016  . OPHTHALMOLOGY EXAM  10/19/2017  . INFLUENZA VACCINE  02/14/2018  . HEMOGLOBIN A1C  03/23/2018  . FOOT EXAM  09/21/2018  . HIV Screening  Completed    ----------------------------------------------------------------------------------------------------------------------------------------------------------------------------------------------------------------- Physical Exam BP 128/72   Pulse 96   Temp 98.1 F (36.7 C) (Oral)   Ht 5\' 4"  (1.626 m)   Wt 128 lb (58.1 kg)   SpO2 96%   BMI 21.97 kg/m   Physical Exam Exam conducted with a chaperone present (Rebeca Morris).  Constitutional:      Appearance: Normal appearance.  HENT:     Head: Normocephalic and atraumatic.     Nose: Nose normal.     Mouth/Throat:     Mouth: Mucous membranes are moist.  Eyes:     General: No scleral icterus. Neck:     Musculoskeletal: Neck supple.  Cardiovascular:     Rate and Rhythm: Normal rate and regular rhythm.  Chest:     Chest wall: Tenderness (chest wall tenderness at sterno-chondral junction.  No mass palpated. ) present.  Skin:    General: Skin is warm and dry.     Findings: Rash (scaly patch L abdominal wall. ) present.  Neurological:     General: No focal deficit present.     Mental Status: She is alert.  Psychiatric:        Mood and Affect: Mood normal.        Behavior: Behavior normal.     ------------------------------------------------------------------------------------------------------------------------------------------------------------------------------------------------------------------- Assessment and Plan  Chest wall pain -No mass palpated over tender area.  Given location, this is likely more of a costochondritis.  Recommend trying ice/heat for comfort.  May try anti-inflammatory as well if needed.   Type 1 diabetes mellitus with neurological manifestations, uncontrolled -Several months since she was seen by endocrinologist, instructed to schedule  f/u appt.   Eczema rx for triamcinolone cream.

## 2018-08-02 NOTE — Patient Instructions (Addendum)
**  Schedule a follow up with Dr. [name] for your Diabetes** Try steroid cream on area on your abdomen, let me know if not improving.   Costochondritis Costochondritis is swelling and irritation (inflammation) of the tissue (cartilage) that connects your ribs to your breastbone (sternum). This causes pain in the front of your chest. Usually, the pain:  Starts gradually.  Is in more than one rib. This condition usually goes away on its own over time. Follow these instructions at home:  Do not do anything that makes your pain worse.  If directed, put ice on the painful area: ? Put ice in a plastic bag. ? Place a towel between your skin and the bag. ? Leave the ice on for 20 minutes, 2-3 times a day.  If directed, put heat on the affected area as often as told by your doctor. Use the heat source that your doctor tells you to use, such as a moist heat pack or a heating pad. ? Place a towel between your skin and the heat source. ? Leave the heat on for 20-30 minutes. ? Take off the heat if your skin turns bright red. This is very important if you cannot feel pain, heat, or cold. You may have a greater risk of getting burned.  Take over-the-counter and prescription medicines only as told by your doctor.  Return to your normal activities as told by your doctor. Ask your doctor what activities are safe for you.  Keep all follow-up visits as told by your doctor. This is important. Contact a doctor if:  You have chills or a fever.  Your pain does not go away or it gets worse.  You have a cough that does not go away. Get help right away if:  You are short of breath. This information is not intended to replace advice given to you by your health care provider. Make sure you discuss any questions you have with your health care provider. Document Released: 12/20/2007 Document Revised: 01/21/2016 Document Reviewed: 10/27/2015 Elsevier Interactive Patient Education  2019 [employer].

## 2018-08-02 NOTE — Assessment & Plan Note (Signed)
-  Several months since she was seen by endocrinologist, instructed to schedule f/u appt.

## 2018-08-02 NOTE — Assessment & Plan Note (Signed)
rx for triamcinolone cream.

## 2018-08-12 ENCOUNTER — Encounter: Payer: Self-pay | Admitting: Nurse Practitioner

## 2018-08-12 ENCOUNTER — Ambulatory Visit (INDEPENDENT_AMBULATORY_CARE_PROVIDER_SITE_OTHER): Payer: 59 | Admitting: Pulmonary Disease

## 2018-08-12 ENCOUNTER — Ambulatory Visit (INDEPENDENT_AMBULATORY_CARE_PROVIDER_SITE_OTHER): Payer: 59 | Admitting: Nurse Practitioner

## 2018-08-12 VITALS — BP 130/74 | HR 117 | Ht 64.0 in | Wt 127.0 lb

## 2018-08-12 DIAGNOSIS — R911 Solitary pulmonary nodule: Secondary | ICD-10-CM | POA: Insufficient documentation

## 2018-08-12 DIAGNOSIS — R0600 Dyspnea, unspecified: Secondary | ICD-10-CM | POA: Diagnosis not present

## 2018-08-12 DIAGNOSIS — J4520 Mild intermittent asthma, uncomplicated: Secondary | ICD-10-CM

## 2018-08-12 DIAGNOSIS — R0683 Snoring: Secondary | ICD-10-CM | POA: Insufficient documentation

## 2018-08-12 DIAGNOSIS — N63 Unspecified lump in unspecified breast: Secondary | ICD-10-CM | POA: Insufficient documentation

## 2018-08-12 HISTORY — DX: Mild intermittent asthma, uncomplicated: J45.20

## 2018-08-12 LAB — PULMONARY FUNCTION TEST
DL/VA % pred: 96 %
DL/VA: 4.64 ml/min/mmHg/L
DLCO UNC % PRED: 82 %
DLCO UNC: 19.91 ml/min/mmHg
FEF 25-75 PRE: 1.74 L/s
FEF 25-75 Post: 2.34 L/sec
FEF2575-%Change-Post: 34 %
FEF2575-%PRED-POST: 76 %
FEF2575-%Pred-Pre: 56 %
FEV1-%CHANGE-POST: 11 %
FEV1-%PRED-POST: 85 %
FEV1-%PRED-PRE: 76 %
FEV1-POST: 2.55 L
FEV1-Pre: 2.28 L
FEV1FVC-%Change-Post: 10 %
FEV1FVC-%Pred-Pre: 85 %
FEV6-%CHANGE-POST: 2 %
FEV6-%PRED-POST: 91 %
FEV6-%PRED-PRE: 88 %
FEV6-POST: 3.28 L
FEV6-PRE: 3.2 L
FEV6FVC-%CHANGE-POST: 0 %
FEV6FVC-%PRED-POST: 101 %
FEV6FVC-%PRED-PRE: 101 %
FVC-%Change-Post: 0 %
FVC-%PRED-POST: 89 %
FVC-%Pred-Pre: 88 %
FVC-Post: 3.28 L
FVC-Pre: 3.25 L
POST FEV6/FVC RATIO: 100 %
PRE FEV6/FVC RATIO: 100 %
Post FEV1/FVC ratio: 78 %
Pre FEV1/FVC ratio: 70 %
RV % PRED: 137 %
RV: 2.24 L
TLC % pred: 103 %
TLC: 5.23 L

## 2018-08-12 MED ORDER — BUDESONIDE-FORMOTEROL FUMARATE 80-4.5 MCG/ACT IN AERO: 2.0000 | INHALATION_SPRAY | Freq: Two times a day (BID) | RESPIRATORY_TRACT | 0 refills | Status: DC

## 2018-08-12 NOTE — Progress Notes (Signed)
Full PFT performed today. °

## 2018-08-12 NOTE — Patient Instructions (Signed)
Will start symbicort 80/4.5 2 puffs twice daily May stop Spiriva Continue albuterol as needed Please quit smoking Follow up with Dr. Donnal Moat or me in 6-8 weeks or sooner if needed

## 2018-08-12 NOTE — Progress Notes (Signed)
_0  ID: [name], female    DOB: [birth year], 43 y.o.   MRN: [id number]  Chief Complaint  Patient presents with  . Follow-up    Stoped the sprivia and it dropped her blood sugar.    Referring provider: Tamsen Roers, MD  HPI 43 year old female active smoker with history of asthma followed by Dr. [name].  Tests:  Imaging: CXR 11/15/219>>There is no acute pneumonia nor CHF. Mildly increased lung markings bilaterally are compatible with reactive airway disease and/or COPD in the patient's smoking history. CTA 05/10/18>>No thoracic aortic aneurysm or dissection. No demonstrable pulmonary embolus. No edema or consolidation. 2 mm nodular opacity in the left upper lobe anteriorly. There is no nodular opacity in the right upper lobe as well as question on recent chest radiograph. It is possible that the nodular opacity question on chest radiograph represents a bone island. No follow-up needed if patient is low-risk. Non-contrast chest CT can be considered in 12 months if patient is high-risk.  PFT: PFT Results Latest Ref Rng & Units 08/12/2018  FVC-Pre L 3.25  FVC-Predicted Pre % 88  FVC-Post L 3.28  FVC-Predicted Post % 89  Pre FEV1/FVC % % 70  Post FEV1/FCV % % 78  FEV1-Pre L 2.28  FEV1-Predicted Pre % 76  FEV1-Post L 2.55  DLCO UNC% % 82  DLCO COR %Predicted % 96  TLC L 5.23  TLC % Predicted % 103  RV % Predicted % 137   Epworth Scale:  Results of the Epworth flowsheet 08/12/2018  Sitting and reading 0  Watching TV 0  Sitting, inactive in a public place (e.g. a theatre or a meeting) 0  As a passenger in a car for an hour without a break 0  Lying down to rest in the afternoon when circumstances permit 0  Sitting and talking to someone 0  Sitting quietly after a lunch without alcohol 0  In a car, while stopped for a few minutes in traffic 0  Total score 0   OV 08/12/18 - follow up after PFT Patient presents today for follow-up with PFT.  She was last seen by  Dr. Ebony Hail on 07/17/2018.  She was started with Spiriva.  She states that she had to quit her Spiriva due to it causing low blood sugars.  She complains today of a lump in her right breast near her sternum.  She is concerned that it is causing her shortness of breath.  He did not get her mammogram last year.  She has been using her albuterol inhaler as needed.  She denies any recent fevers.  Her vitals today in the office are stable.  Her O2 sats are 98% on room air.  She denies any chest pain or edema.  She did have a cardiac work-up and states that everything was normal.  No Known Allergies  Immunization History  Administered Date(s) Administered  . Influenza Split 04/11/2012  . Influenza,inj,Quad PF,6+ Mos 08/02/2018  . Td 07/19/1999    Past Medical History:  Diagnosis Date  . Anxiety state, unspecified 03/26/2009  . ASTHMA 02/02/2007  . DEPRESSION 02/16/2009  . DIABETES MELLITUS, TYPE I 02/02/2007  . Endometriosis   . GERD 12/21/2008  . Hyperlipidemia   . Lesion of vocal cord   . Shortness of breath 09/25/2007  . Vocal fold leukoplakia     Tobacco History: Social History   Tobacco Use  Smoking Status Current Every Day Smoker  . Packs/day: 1.50  . Years: 25.00  . Pack years:  37.50  . Types: Cigarettes  Smokeless Tobacco Never Used  Tobacco Comment   1PPD as of 07/2018   Ready to quit: Not Answered Counseling given: Not Answered Comment: 1PPD as of 07/2018   Outpatient Encounter Medications as of 08/12/2018  Medication Sig  . albuterol (PROVENTIL HFA;VENTOLIN HFA) 108 (90 Base) MCG/ACT inhaler Inhale 2 puffs into the lungs every 6 (six) hours as needed for wheezing or shortness of breath.  . ALPRAZolam (XANAX) 0.25 MG tablet Take 0.25 mg by mouth 3 (three) times daily as needed.   Marland Kitchen atorvastatin (LIPITOR) 10 MG tablet Take 10 mg by mouth at bedtime.  . Blood Glucose Monitoring Suppl (ACCU-CHEK AVIVA PLUS) W/DEVICE KIT 1 Device by Does not apply route once.  . Cholecalciferol  (VITAMIN D3) 50 MCG (2000 UT) capsule Take 2,000 Units by mouth daily.  Marland Kitchen gabapentin (NEURONTIN) 300 MG capsule Take by mouth 3 (three) times daily. As needed  . glucagon 1 MG injection Inject 1 mg into the muscle once as needed.  Marland Kitchen glucose blood (ACCU-CHEK AVIVA PLUS) test strip USE TO TEST BLOOD SUGAR 7 TIMES DAILY, and lancets 7/day. DX E10.41  . HYDROcodone-acetaminophen (NORCO) 7.5-325 MG tablet TAKE 1 TABLET BY MOUTH THREE TIMES A DAY AS NEEDED FOR PAIN  . ibuprofen (ADVIL,MOTRIN) 200 MG tablet Take 600-800 mg by mouth every 8 (eight) hours as needed (for pain.).  . Insulin Pen Needle 31G X 8 MM MISC Use as directed   . meclizine (ANTIVERT) 25 MG tablet Take 1 tablet (25 mg total) by mouth 3 (three) times daily as needed for dizziness.  . meloxicam (MOBIC) 7.5 MG tablet Take 1 tablet (7.5 mg total) by mouth daily.  . metoprolol succinate (TOPROL-XL) 25 MG 24 hr tablet Take 25 mg by mouth daily.  . metroNIDAZOLE (FLAGYL) 500 MG tablet Take 1 tablet (500 mg total) by mouth 3 (three) times daily.  . Multiple Vitamin (MULTIVITAMIN WITH MINERALS) TABS tablet Take 1 tablet by mouth daily.  Marland Kitchen NOVOLOG 100 UNIT/ML injection INJECT 40 UNITS DAILY PER INSULIN PUMP (MAX DOSAGE)  . omeprazole (PRILOSEC) 40 MG capsule Take 40 mg by mouth daily before breakfast.  . PARoxetine (PAXIL) 40 MG tablet Take 1 tablet (40 mg total) by mouth daily. (Patient taking differently: Take 20 mg by mouth daily. )  . rOPINIRole (REQUIP) 1 MG tablet Take 1 mg by mouth at bedtime.  . triamcinolone cream (KENALOG) 0.1 % Apply 1 application topically 2 (two) times daily.  . budesonide-formoterol (SYMBICORT) 80-4.5 MCG/ACT inhaler Inhale 2 puffs into the lungs 2 (two) times daily.  . [DISCONTINUED] Tiotropium Bromide Monohydrate (SPIRIVA RESPIMAT) 2.5 MCG/ACT AERS Inhale 2 puffs into the lungs daily for 1 day.   No facility-administered encounter medications on file as of 08/12/2018.      Review of Systems  Review of  Systems  Constitutional: Negative.  Negative for chills and fever.  HENT: Negative.   Respiratory: Positive for shortness of breath. Negative for cough and wheezing.   Cardiovascular: Negative.  Negative for chest pain, palpitations and leg swelling.  Gastrointestinal: Negative.   Allergic/Immunologic: Negative.   Neurological: Negative.   Psychiatric/Behavioral: Negative.        Physical Exam  BP 130/74 (BP Location: Left Arm, Patient Position: Sitting, Cuff Size: Normal)   Pulse (!) 117   Ht 5' 4" (1.626 m)   Wt 127 lb (57.6 kg)   SpO2 98%   BMI 21.80 kg/m   Wt Readings from Last 5 Encounters:  08/12/18  127 lb (57.6 kg)  08/02/18 128 lb (58.1 kg)  07/26/18 126 lb (57.2 kg)  05/31/18 125 lb 0.4 oz (56.7 kg)  05/27/18 125 lb (56.7 kg)     Physical Exam Vitals signs and nursing note reviewed.  Constitutional:      General: She is not in acute distress.    Appearance: She is well-developed.  HENT:     Mouth/Throat:     Comments: MP CLASS I Cardiovascular:     Rate and Rhythm: Normal rate and regular rhythm.  Pulmonary:     Effort: Pulmonary effort is normal. No respiratory distress.     Breath sounds: Rhonchi present. No wheezing.  Musculoskeletal:        General: No swelling.  Neurological:     Mental Status: She is alert and oriented to person, place, and time.        Assessment & Plan:   Snoring Patient reports snoring. She has MP class I airway. Her Epworth score was 0. Will discuss with Dr. [name] if HST is needed.   Breast lump Right breast lump noted at 3 o'clock position. Lump is mobile and tender to palpation. Most likely a cyst, but recommended patient contact her PCP or GYN for referral for mammogram. She missed her mammogram last year.   Mild intermittent asthma without complication Discussion: PFT today demonstrates post bronchodilator response indicating an asthmatic component. No obstruction noted. Will trial Symbicort. Will give sample.     Patient Instructions  Will start symbicort 80/4.5 2 puffs twice daily May stop Spiriva Continue albuterol as needed Please quit smoking Follow up with Dr. Donnal Moat or me in 6-8 weeks or sooner if needed    Lung nodule Active smoker - Will need follow up CT in 1 year     Fenton Foy, NP 08/12/2018

## 2018-08-12 NOTE — Assessment & Plan Note (Signed)
Discussion: PFT today demonstrates post bronchodilator response indicating an asthmatic component. No obstruction noted. Will trial Symbicort. Will give sample.   Patient Instructions  Will start symbicort 80/4.5 2 puffs twice daily May stop Spiriva Continue albuterol as needed Please quit smoking Follow up with Dr. Donnal Moat or me in 6-8 weeks or sooner if needed

## 2018-08-12 NOTE — Assessment & Plan Note (Addendum)
Active smoker - Will need follow up CT in 1 year

## 2018-08-12 NOTE — Assessment & Plan Note (Signed)
Patient reports snoring. She has MP class I airway. Her Epworth score was 0. Will discuss with Dr. [name] if HST is needed.

## 2018-08-12 NOTE — Assessment & Plan Note (Signed)
Right breast lump noted at 3 o'clock position. Lump is mobile and tender to palpation. Most likely a cyst, but recommended patient contact her PCP or GYN for referral for mammogram. She missed her mammogram last year.

## 2018-08-14 ENCOUNTER — Encounter: Payer: Self-pay | Admitting: Family Medicine

## 2018-08-14 ENCOUNTER — Ambulatory Visit (INDEPENDENT_AMBULATORY_CARE_PROVIDER_SITE_OTHER): Payer: 59 | Admitting: Family Medicine

## 2018-08-14 VITALS — BP 138/74 | HR 82 | Temp 97.6°F | Ht 64.0 in | Wt 126.0 lb

## 2018-08-14 DIAGNOSIS — M2550 Pain in unspecified joint: Secondary | ICD-10-CM | POA: Insufficient documentation

## 2018-08-14 DIAGNOSIS — M25561 Pain in right knee: Secondary | ICD-10-CM

## 2018-08-14 DIAGNOSIS — M25512 Pain in left shoulder: Secondary | ICD-10-CM | POA: Diagnosis not present

## 2018-08-14 DIAGNOSIS — G8929 Other chronic pain: Secondary | ICD-10-CM

## 2018-08-14 DIAGNOSIS — M25562 Pain in left knee: Secondary | ICD-10-CM

## 2018-08-14 HISTORY — DX: Pain in unspecified joint: M25.50

## 2018-08-14 LAB — SEDIMENTATION RATE: Sed Rate: 22 mm/hr — ABNORMAL HIGH (ref 0–20)

## 2018-08-14 LAB — HIGH SENSITIVITY CRP: CRP, High Sensitivity: 1.11 mg/L (ref 0.000–5.000)

## 2018-08-14 NOTE — Progress Notes (Signed)
[name] - 43 y.o. female MRN [id number]  Date of birth: 1975/11/13  Subjective Chief Complaint  Patient presents with  . Hand Pain    Left hand wrist pain has been ongoing for 4 days. Denies injury-she has been wearing a brace.    HPI [name] is a 43 y.o. female here today with complaint of L wrist pain.  She reports that this started about 4 days ago.  She denies any injury or overuse.  She has had similar episodes in the past.  She denies numbness or tingling.  She is using a brace but this is a little uncomfortable to wear.  She reports that her current pain medicine of hydrocodone 7.6m is not helping and she feels like this hasn't been very helpful for her chronic pain recently either and is requesting a higher dose.   She also reports symptoms of fingers and toes turning white at times, worse with cold weather.  Sometimes these will turn blue.  Denies pain with this.   ROS:  A comprehensive ROS was completed and negative except as noted per HPI  No Known Allergies  Past Medical History:  Diagnosis Date  . Anxiety state, unspecified 03/26/2009  . ASTHMA 02/02/2007  . DEPRESSION 02/16/2009  . DIABETES MELLITUS, TYPE I 02/02/2007  . Endometriosis   . GERD 12/21/2008  . Hyperlipidemia   . Lesion of vocal cord   . Shortness of breath 09/25/2007  . Vocal fold leukoplakia     Past Surgical History:  Procedure Laterality Date  . ABDOMINAL HYSTERECTOMY  2009   BSO  . DIRECT LARYNGOSCOPY N/A 08/17/2017   Procedure: DIRECT LARYNGOSCOPY WITH OPERATING TELESCOPE WITH BIOPSY;  Surgeon: MHelayne Seminole MD;  Location: [facility]  Service: ENT;  Laterality: N/A;  . NASOPHARYNGEAL BIOPSY Right 08/17/2017   Procedure: NASOPHARYNGEAL BIOPSY RIGHT NASAL LESION;  Surgeon: MHelayne Seminole MD;  Location: [facility]  Service: ENT;  Laterality: Right;    Social History   Socioeconomic History  . Marital status: Legally Separated    Spouse name: Not on file  . Number of  children: 2  . Years of education: Not on file  . Highest education level: Not on file  Occupational History    Employer: UNEMPLOYED  Social Needs  . Financial resource strain: Not on file  . Food insecurity:    Worry: Not on file    Inability: Not on file  . Transportation needs:    Medical: Not on file    Non-medical: Not on file  Tobacco Use  . Smoking status: Current Every Day Smoker    Packs/day: 1.50    Years: 25.00    Pack years: 37.50    Types: Cigarettes  . Smokeless tobacco: Never Used  . Tobacco comment: 1PPD as of 07/2018  Substance and Sexual Activity  . Alcohol use: No    Alcohol/week: 0.0 standard drinks  . Drug use: No  . Sexual activity: Not on file  Lifestyle  . Physical activity:    Days per week: Not on file    Minutes per session: Not on file  . Stress: Not on file  Relationships  . Social connections:    Talks on phone: Not on file    Gets together: Not on file    Attends religious service: Not on file    Active member of club or organization: Not on file    Attends meetings of clubs or organizations: Not on file  Relationship status: Not on file  Other Topics Concern  . Not on file  Social History Narrative   2 children--pt has custody. Husband pays child support.   Daily Caffeine Use-3 2 liters a day   Pt does not get regular exercise.    Family History  Problem Relation Age of Onset  . Diabetes Mother   . Diabetes Maternal Aunt   . Emphysema Maternal Aunt   . Emphysema Maternal Uncle   . Diabetes Maternal Grandfather   . Lung cancer Maternal Aunt   . Colon cancer Neg Hx   . Kidney disease Neg Hx   . Liver disease Neg Hx     Health Maintenance  Topic Date Due  . PNEUMOCOCCAL POLYSACCHARIDE VACCINE AGE 68-64 HIGH RISK  04/14/1978  . TETANUS/TDAP  07/18/2009  . PAP SMEAR-Modifier  02/17/2012  . URINE MICROALBUMIN  12/30/2016  . OPHTHALMOLOGY EXAM  10/19/2017  . HEMOGLOBIN A1C  03/23/2018  . FOOT EXAM  09/21/2018  . INFLUENZA  VACCINE  Completed  . HIV Screening  Completed    ----------------------------------------------------------------------------------------------------------------------------------------------------------------------------------------------------------------- Physical Exam BP 138/74   Pulse 82   Temp 97.6 F (36.4 C) (Oral)   Ht '5\' 4"'  (1.626 m)   Wt 126 lb (57.2 kg)   SpO2 96%   BMI 21.63 kg/m   Physical Exam Constitutional:      Appearance: Normal appearance.  HENT:     Head: Normocephalic and atraumatic.  Cardiovascular:     Rate and Rhythm: Normal rate and regular rhythm.  Pulmonary:     Effort: Pulmonary effort is normal.     Breath sounds: Normal breath sounds.  Musculoskeletal:     Comments: TTP along radial portion of wrist extending from thumb to mid forearm.  ROM of thumb is normal but painful.  Pain with resisted thumb extension and abduction.  +Finkelstein test.   Neurological:     General: No focal deficit present.     Mental Status: She is alert.  Psychiatric:        Mood and Affect: Mood normal.        Behavior: Behavior normal.     ------------------------------------------------------------------------------------------------------------------------------------------------------------------------------------------------------------------- Assessment and Plan  Arthralgia -Seems to be more a tendonitis/tenonsynovitis.  Trial of pennsaid to area, sample provided. Continue brace and icing as tolerated.  If not improving will have her see Dr. Raeford Razor.  -Check ESR given recurrent issues and reynaud.  -Discussed I would not increased pain medication dose, interested in pain mgmt. Referral placed.

## 2018-08-14 NOTE — Patient Instructions (Signed)
Try pennsaid to area for the next few days.  Use 1/4-1/2 pack twice per day.  Continue icing area and wear brace as tolerated I have placed a referral to pain management for you as well.

## 2018-08-14 NOTE — Assessment & Plan Note (Signed)
-  Seems to be more a tendonitis/tenonsynovitis.  Trial of pennsaid to area. Continue brace and icing as tolerated.  If not improving will have her see Dr. Raeford Razor.  -Check ESR given recurrent issues and reynaud.  -Discussed I would not increased pain medication dose, interested in pain mgmt. Referral placed.

## 2018-08-15 ENCOUNTER — Other Ambulatory Visit: Payer: Self-pay | Admitting: Endocrinology

## 2018-08-16 NOTE — Progress Notes (Signed)
Inflammatory markers very mildly elevated, similar to when checked in the past.  Not at a level that would be indicative of auto-immune process.

## 2018-08-19 ENCOUNTER — Telehealth: Payer: Self-pay | Admitting: Family Medicine

## 2018-08-19 MED ORDER — ALPRAZOLAM 0.25 MG PO TABS: 0.2500 mg | ORAL_TABLET | Freq: Three times a day (TID) | ORAL | 0 refills | Status: DC | PRN

## 2018-08-19 MED ORDER — HYDROCODONE-ACETAMINOPHEN 7.5-325 MG PO TABS: ORAL_TABLET | ORAL | 0 refills | Status: DC

## 2018-08-19 NOTE — Telephone Encounter (Signed)
Dr. Zigmund Daniel please advise, pt has requested refills for hydrocodone-acetaminophen 7.5-325 mg tab was last refilled12/19/2019 and also requests Alprazolam (xanax) 0.2 mg last refilled 05/21/2018. Neither of the rx have been prescribed by you.

## 2018-08-19 NOTE — Telephone Encounter (Signed)
Copied from [place] [phone]. Topic: Quick Communication - Rx Refill/Question >> Aug 19, 2018 10:40 AM Lionel December wrote: Medication: HYDROcodone-acetaminophen (NORCO) 7.5-325 MG tablet,   ALPRAZolam (XANAX) 0.25 MG tablet  Has the patient contacted their pharmacy? No. (Agent: If no, request that the patient contact the pharmacy for the refill.) (Agent: If yes, when and what did the pharmacy advise?)  Preferred Pharmacy (with phone number or street name): [id number] [address], [place]. [phone] (Phone) [phone] (Fax)    Agent: Please be advised that RX refills may take up to 3 business days. We ask that you follow-up with your pharmacy.

## 2018-08-19 NOTE — Telephone Encounter (Signed)
[place] PDMP reviewed and it appears that she had both of these filled from another prescriber on 07/29/2018.  Refill not appropriate at this time.

## 2018-08-21 NOTE — Telephone Encounter (Signed)
Patient has been made aware of the provider's recommendations and verbalizes understanding. She did not have any further questions or concerns prior to call ending.

## 2018-08-23 ENCOUNTER — Other Ambulatory Visit: Payer: Self-pay

## 2018-08-23 ENCOUNTER — Telehealth: Payer: Self-pay

## 2018-08-23 NOTE — Telephone Encounter (Signed)
Copied from [place] [phone]. Topic: Quick Communication - Rx Refill/Question >> Aug 19, 2018 10:40 AM Lionel December wrote: Medication: HYDROcodone-acetaminophen (NORCO) 7.5-325 MG tablet,   ALPRAZolam (XANAX) 0.25 MG tablet  Has the patient contacted their pharmacy? No. (Agent: If no, request that the patient contact the pharmacy for the refill.) (Agent: If yes, when and what did the pharmacy advise?)  Preferred Pharmacy (with phone number or street name): [id number] [address], [place]. [phone] (Phone) [phone] (Fax)    Agent: Please be advised that RX refills may take up to 3 business days. We ask that you follow-up with your pharmacy. >> Aug 23, 2018 10:21 AM Yvette Rack wrote: Pt called back requesting a refill on both the Alprazolam and Hydrocodone.

## 2018-08-23 NOTE — Telephone Encounter (Signed)
Patient has an appointment scheduled for 08/26/2018, FYI

## 2018-08-23 NOTE — Telephone Encounter (Signed)
I think we need to have her come in to discuss these prescriptions. PDMP reviewed once again and she has gotten #180 alprazolam (2 month supply) since 07/15/2018 (refilled 07/15/18 and 07/29/18).  Also wouldn't be due for pain medication until next week (hydrocodone last filled 07/29/18) .  She is once again asking for early fill of medication. I am concerned she is not taking these as directed and would like to talk with her about her continued use of this medication.

## 2018-08-23 NOTE — Telephone Encounter (Signed)
Pt was scheduled to see Dr.Achraya at the [facility] office on 08/27/18. Dr.Acharya will no longer see patients at that location. Dr.Acharya would like the patient to f/u with her at the [facility] office. Called pt to reschedule appt. She would prefer to be seen on [place] and is agreeable with the location change. Offered the same appt date that she was previously scehduled. She would prefer to go out a week. appt scheduled with Dr.Acharya 09/04/18 @ 11am, asked her to arrive 68min prior for check in. Pt made aware of appt, date, time and location, also provided the office phone number.

## 2018-08-26 ENCOUNTER — Ambulatory Visit: Payer: 59 | Admitting: Family Medicine

## 2018-08-26 ENCOUNTER — Encounter: Payer: Self-pay | Admitting: Family Medicine

## 2018-08-26 ENCOUNTER — Ambulatory Visit (INDEPENDENT_AMBULATORY_CARE_PROVIDER_SITE_OTHER): Payer: 59 | Admitting: Family Medicine

## 2018-08-26 VITALS — BP 128/66 | HR 72 | Temp 97.9°F | Ht 64.0 in | Wt 125.0 lb

## 2018-08-26 DIAGNOSIS — M5442 Lumbago with sciatica, left side: Secondary | ICD-10-CM | POA: Diagnosis not present

## 2018-08-26 DIAGNOSIS — Z79899 Other long term (current) drug therapy: Secondary | ICD-10-CM | POA: Diagnosis not present

## 2018-08-26 DIAGNOSIS — F419 Anxiety disorder, unspecified: Secondary | ICD-10-CM | POA: Diagnosis not present

## 2018-08-26 DIAGNOSIS — Z79891 Long term (current) use of opiate analgesic: Secondary | ICD-10-CM | POA: Diagnosis not present

## 2018-08-26 NOTE — Assessment & Plan Note (Signed)
-  We discussed the potential complications of opiod and benzodiazepine use and discussed limiting alprazolam use. -Discussed that medication medication would not be refilled until the end of this month.

## 2018-08-26 NOTE — Assessment & Plan Note (Signed)
>>  ASSESSMENT AND PLAN FOR ANXIETY WRITTEN ON 08/26/2018  9:32 AM BY MATTHEWS, CODY, DO  -We discussed the potential complications of opiod and benzodiazepine use and discussed limiting alprazolam  use. -Discussed that medication medication would not be refilled until the end of this month.

## 2018-08-26 NOTE — Assessment & Plan Note (Signed)
-  Chronic low back pain, currently treated with opioid pain medication.  -Discussed that given her pain medication is not as effective as previous that I would recommend that she see pain management to discuss further treatment of her pain.  -I will plan to fill her pain medication until she is seen by pain management once her UDS returns.  -Opioid agreement signed today. -PDMP reviewed.

## 2018-08-26 NOTE — Progress Notes (Signed)
[name] - 43 y.o. female MRN [id number]  Date of birth: [birth year]  Subjective Chief Complaint  Patient presents with  . Medication Refill    HPI [name] is a 43 y.o. female with history of T1DM with neuropathy, chronic low back pain and and anxiety here today to discuss medications.  She reports that she has been taking Norco 7.5mg  managed by her previous pcp, Dr. Rex Kras.  She has been doing fairly well with this although at her last visit she expressed that she didn't feel that it was working as well as it had been previously.  A referral was made to pain management at that time but she is close to being out of medication.  She also recently requested a refill of alprazolam.  She previously reported that she is not using these that frequently however PDMP review indicates refills picked up on 07/15/18 and 07/29/18.  She reports that she still has over 1/2 bottle of this left.  She denies side effects from medication including oversedation or significant constipation.    She is also due for follow up with endocrinology.  She reports that she has an upcoming appointment. .    ROS:  A comprehensive ROS was completed and negative except as noted per HPI  No Known Allergies  Past Medical History:  Diagnosis Date  . Anxiety state, unspecified 03/26/2009  . ASTHMA 02/02/2007  . DEPRESSION 02/16/2009  . DIABETES MELLITUS, TYPE I 02/02/2007  . Endometriosis   . GERD 12/21/2008  . Hyperlipidemia   . Lesion of vocal cord   . Shortness of breath 09/25/2007  . Vocal fold leukoplakia     Past Surgical History:  Procedure Laterality Date  . ABDOMINAL HYSTERECTOMY  2009   BSO  . DIRECT LARYNGOSCOPY N/A 08/17/2017   Procedure: DIRECT LARYNGOSCOPY WITH OPERATING TELESCOPE WITH BIOPSY;  Surgeon: Helayne Seminole, MD;  Location: [facility];  Service: ENT;  Laterality: N/A;  . NASOPHARYNGEAL BIOPSY Right 08/17/2017   Procedure: NASOPHARYNGEAL BIOPSY RIGHT NASAL LESION;  Surgeon:  Helayne Seminole, MD;  Location: [facility];  Service: ENT;  Laterality: Right;    Social History   Socioeconomic History  . Marital status: Legally Separated    Spouse name: Not on file  . Number of children: 2  . Years of education: Not on file  . Highest education level: Not on file  Occupational History    Employer: UNEMPLOYED  Social Needs  . Financial resource strain: Not on file  . Food insecurity:    Worry: Not on file    Inability: Not on file  . Transportation needs:    Medical: Not on file    Non-medical: Not on file  Tobacco Use  . Smoking status: Current Every Day Smoker    Packs/day: 1.50    Years: 25.00    Pack years: 37.50    Types: Cigarettes  . Smokeless tobacco: Never Used  . Tobacco comment: 1PPD as of 07/2018  Substance and Sexual Activity  . Alcohol use: No    Alcohol/week: 0.0 standard drinks  . Drug use: No  . Sexual activity: Not on file  Lifestyle  . Physical activity:    Days per week: Not on file    Minutes per session: Not on file  . Stress: Not on file  Relationships  . Social connections:    Talks on phone: Not on file    Gets together: Not on file    Attends religious service: Not  on file    Active member of club or organization: Not on file    Attends meetings of clubs or organizations: Not on file    Relationship status: Not on file  Other Topics Concern  . Not on file  Social History Narrative   2 children--pt has custody. Husband pays child support.   Daily Caffeine Use-3 2 liters a day   Pt does not get regular exercise.    Family History  Problem Relation Age of Onset  . Diabetes Mother   . Diabetes Maternal Aunt   . Emphysema Maternal Aunt   . Emphysema Maternal Uncle   . Diabetes Maternal Grandfather   . Lung cancer Maternal Aunt   . Colon cancer Neg Hx   . Kidney disease Neg Hx   . Liver disease Neg Hx     Health Maintenance  Topic Date Due  . PNEUMOCOCCAL POLYSACCHARIDE VACCINE AGE 38-64 HIGH RISK   04/14/1978  . TETANUS/TDAP  07/18/2009  . PAP SMEAR-Modifier  02/17/2012  . URINE MICROALBUMIN  12/30/2016  . OPHTHALMOLOGY EXAM  10/19/2017  . HEMOGLOBIN A1C  03/23/2018  . FOOT EXAM  09/21/2018  . INFLUENZA VACCINE  Completed  . HIV Screening  Completed    ----------------------------------------------------------------------------------------------------------------------------------------------------------------------------------------------------------------- Physical Exam BP 128/66   Pulse 72   Temp 97.9 F (36.6 C) (Oral)   Ht 5\' 4"  (1.626 m)   Wt 125 lb (56.7 kg)   SpO2 97%   BMI 21.46 kg/m   Physical Exam Constitutional:      Appearance: Normal appearance.  HENT:     Head: Normocephalic and atraumatic.  Eyes:     General: No scleral icterus. Cardiovascular:     Rate and Rhythm: Normal rate and regular rhythm.  Pulmonary:     Effort: Pulmonary effort is normal.     Breath sounds: Normal breath sounds.  Skin:    General: Skin is warm and dry.  Neurological:     General: No focal deficit present.     Mental Status: She is alert.  Psychiatric:        Mood and Affect: Mood normal.        Behavior: Behavior normal.     ------------------------------------------------------------------------------------------------------------------------------------------------------------------------------------------------------------------- Assessment and Plan  Low back pain with left-sided sciatica -Chronic low back pain, currently treated with opioid pain medication.  -Discussed that given her pain medication is not as effective as previous that I would recommend that she see pain management to discuss further treatment of her pain.  -I will plan to fill her pain medication until she is seen by pain management once her UDS returns.  -Opioid agreement signed today. -PDMP reviewed.   Anxiety -We discussed the potential complications of opiod and benzodiazepine use and  discussed limiting alprazolam use. -Discussed that medication medication would not be refilled until the end of this month.

## 2018-08-26 NOTE — Patient Instructions (Signed)
Based on current fill patterns you are not due for a fill on your alprazolam until the end of February.  I will refill pain medication once your drug screen returns up until you are seen by pain management. Be sure to see Dr. [name] for follow up of your diabetes.

## 2018-08-27 ENCOUNTER — Ambulatory Visit: Payer: 59 | Admitting: Internal Medicine

## 2018-08-28 NOTE — Telephone Encounter (Signed)
Waiting on UDS to return.

## 2018-08-28 NOTE — Telephone Encounter (Signed)
Refill appropriate 

## 2018-08-28 NOTE — Telephone Encounter (Signed)
Pt called to follow up on refill for hydrocodone. Please advise. CB#[phone]

## 2018-08-29 NOTE — Telephone Encounter (Signed)
Patient notified-verbalized understanding. Will be notified when UDS is resulted.

## 2018-08-30 ENCOUNTER — Other Ambulatory Visit: Payer: Self-pay | Admitting: Family Medicine

## 2018-08-30 LAB — TOXASSURE SELECT 13 (MW), URINE

## 2018-08-30 NOTE — Progress Notes (Signed)
No medications detected in urine.  This is not consistent with what she is being prescribed and current fill patterns as both alprazolam and hydrocodone would be expected in her urine.  No refills will be provided for controlled medications (alprazolam or hydrocodone).

## 2018-09-04 ENCOUNTER — Encounter: Payer: Self-pay | Admitting: *Deleted

## 2018-09-04 ENCOUNTER — Ambulatory Visit: Payer: 59 | Admitting: Internal Medicine

## 2018-09-06 ENCOUNTER — Encounter: Payer: Self-pay | Admitting: Emergency Medicine

## 2018-09-06 ENCOUNTER — Other Ambulatory Visit: Payer: Self-pay

## 2018-09-06 ENCOUNTER — Emergency Department
Admission: EM | Admit: 2018-09-06 | Discharge: 2018-09-06 | Disposition: A | Discharge status: Home / Self Care | Payer: 59 | Attending: Emergency Medicine | Admitting: Emergency Medicine

## 2018-09-06 DIAGNOSIS — G8929 Other chronic pain: Secondary | ICD-10-CM | POA: Insufficient documentation

## 2018-09-06 DIAGNOSIS — F17210 Nicotine dependence, cigarettes, uncomplicated: Secondary | ICD-10-CM | POA: Insufficient documentation

## 2018-09-06 DIAGNOSIS — E109 Type 1 diabetes mellitus without complications: Secondary | ICD-10-CM | POA: Diagnosis not present

## 2018-09-06 DIAGNOSIS — M5441 Lumbago with sciatica, right side: Secondary | ICD-10-CM | POA: Insufficient documentation

## 2018-09-06 DIAGNOSIS — Z794 Long term (current) use of insulin: Secondary | ICD-10-CM | POA: Insufficient documentation

## 2018-09-06 DIAGNOSIS — M25562 Pain in left knee: Secondary | ICD-10-CM | POA: Diagnosis not present

## 2018-09-06 DIAGNOSIS — M5442 Lumbago with sciatica, left side: Secondary | ICD-10-CM | POA: Diagnosis not present

## 2018-09-06 DIAGNOSIS — M25561 Pain in right knee: Secondary | ICD-10-CM | POA: Insufficient documentation

## 2018-09-06 DIAGNOSIS — Z79899 Other long term (current) drug therapy: Secondary | ICD-10-CM | POA: Diagnosis not present

## 2018-09-06 DIAGNOSIS — E1040 Type 1 diabetes mellitus with diabetic neuropathy, unspecified: Secondary | ICD-10-CM | POA: Insufficient documentation

## 2018-09-06 DIAGNOSIS — J45909 Unspecified asthma, uncomplicated: Secondary | ICD-10-CM | POA: Insufficient documentation

## 2018-09-06 MED ORDER — MELOXICAM 15 MG PO TABS: 15.0000 mg | ORAL_TABLET | Freq: Every day | ORAL | 0 refills | Status: DC

## 2018-09-06 MED ORDER — OXYCODONE-ACETAMINOPHEN 5-325 MG PO TABS
1.0000 | ORAL_TABLET | Freq: Once | ORAL | Status: AC
  Administered 2018-09-06: 1 via ORAL
  Filled 2018-09-06: qty 1

## 2018-09-06 MED ORDER — MELOXICAM 7.5 MG PO TABS
15.0000 mg | ORAL_TABLET | Freq: Once | ORAL | Status: AC
  Administered 2018-09-06: 15 mg via ORAL
  Filled 2018-09-06: qty 2

## 2018-09-06 NOTE — ED Provider Notes (Signed)
[facility] Emergency Department Provider Note  ____________________________________________  Time seen: Approximately 4:08 PM  I have reviewed the triage vital signs and the nursing notes.   HISTORY  Chief Complaint Knee Pain    HPI [name] is a 43 y.o. female who presents the emergency department complaining of increased pain after running out of her regular narcotic and benzo prescription.  Patient reports that her primary care has retired, she was seeing a new primary care within the office.  Patient reports that prior to refilling her medications, and the provider ran a urinary drug screen.  Patient reports that this came back with no indication of Vicodin or Xanax which she is prescribed and therefore her primary care has refused to fill any further narcotic prescriptions.  He has referred her to pain management but states that that appointment is in 1 month.  Patient has already undergone her withdrawal symptoms body aches, nausea, sweating.  Patient reports that she had some of her Xanax left and was able to manage her withdrawal symptoms with Xanax.  Patient is complaining of increased pain as she is no longer on her narcotic.  She is requesting an kind of medicinal help to alleviate her symptoms.  I have reviewed the patient's medical record and it reveals that the story that she has given me is born up by her medical record.  Patient did have a urinary drug screen 11 days prior that returned negative for any controlled substance.  According to the primary care's note, he was concerned that patient may be diverting her medications elsewhere and refused to fill a prescription.  He did refer her to pain management.  Reviewing the patient's controlled substance database record, patient has been routinely receiving 7.5 mg of Vicodin as well as Xanax.  According to the records, patient should be out of both her Vicodin and her Xanax at this time.    Past  Medical History:  Diagnosis Date  . Anxiety state, unspecified 03/26/2009  . ASTHMA 02/02/2007  . DEPRESSION 02/16/2009  . DIABETES MELLITUS, TYPE I 02/02/2007  . Endometriosis   . GERD 12/21/2008  . Hyperlipidemia   . Lesion of vocal cord   . Shortness of breath 09/25/2007  . Vocal fold leukoplakia     Patient Active Problem List   Diagnosis Date Noted  . Arthralgia 08/14/2018  . Mild intermittent asthma without complication 41/63/8453  . Snoring 08/12/2018  . Breast lump 08/12/2018  . Lung nodule 08/12/2018  . Chest wall pain 08/02/2018  . Eczema 08/02/2018  . Diabetic peripheral neuropathy ([place]) 01/11/2018  . Low back pain with left-sided sciatica 11/29/2017  . Peripheral polyneuropathy 11/29/2017  . Multinodular goiter 07/29/2015  . Type 1 diabetes mellitus with neurological manifestations, uncontrolled ([place]) 01/27/2014  . Restless leg 11/07/2013  . Cholesteatoma 09/11/2011  . Allergy to environmental factors 09/11/2011  . Anxiety 05/01/2011  . Acid reflux 04/01/2010  . Absence of bladder continence 04/01/2010  . INSOMNIA 02/18/2010  . GERD 12/21/2008  . SMOKER 09/25/2007    Past Surgical History:  Procedure Laterality Date  . ABDOMINAL HYSTERECTOMY  2009   BSO  . DIRECT LARYNGOSCOPY N/A 08/17/2017   Procedure: DIRECT LARYNGOSCOPY WITH OPERATING TELESCOPE WITH BIOPSY;  Surgeon: Helayne Seminole, MD;  Location: [facility] [facility];  Service: ENT;  Laterality: N/A;  . NASOPHARYNGEAL BIOPSY Right 08/17/2017   Procedure: NASOPHARYNGEAL BIOPSY RIGHT NASAL LESION;  Surgeon: Helayne Seminole, MD;  Location: [facility];  Service: ENT;  Laterality:  Right;    Prior to Admission medications   Medication Sig Start Date End Date Taking? Authorizing Provider  albuterol (PROVENTIL HFA;VENTOLIN HFA) 108 (90 Base) MCG/ACT inhaler Inhale 2 puffs into the lungs every 6 (six) hours as needed for wheezing or shortness of breath. 05/31/18   Rudene Re, MD  atorvastatin (LIPITOR) 10 MG tablet Take  10 mg by mouth at bedtime.    [provider]  Blood Glucose Monitoring Suppl (ACCU-CHEK AVIVA PLUS) W/DEVICE KIT 1 Device by Does not apply route once. 01/27/14   Renato Shin, MD  budesonide-formoterol [facility]) 80-4.5 MCG/ACT inhaler Inhale 2 puffs into the lungs 2 (two) times daily. 08/12/18   Fenton Foy, NP  Cholecalciferol (VITAMIN D3) 50 MCG (2000 UT) capsule Take 2,000 Units by mouth daily.    [provider]  gabapentin (NEURONTIN) 300 MG capsule Take by mouth 3 (three) times daily. As needed 12/03/13   [provider]  glucagon 1 MG injection Inject 1 mg into the muscle once as needed. 07/29/15   Renato Shin, MD  glucose blood (ACCU-CHEK AVIVA PLUS) test strip USE TO TEST BLOOD SUGAR 7 TIMES DAILY, and lancets 7/day. DX E10.41 07/29/15   Renato Shin, MD  ibuprofen (ADVIL,MOTRIN) 200 MG tablet Take 600-800 mg by mouth every 8 (eight) hours as needed (for pain.).    [provider]  Insulin Pen Needle 31G X 8 MM MISC Use as directed     [provider]  meclizine (ANTIVERT) 25 MG tablet Take 1 tablet (25 mg total) by mouth 3 (three) times daily as needed for dizziness. 10/10/17   Darel Hong, MD  meloxicam (MOBIC) 15 MG tablet Take 1 tablet (15 mg total) by mouth daily. 09/06/18   , Charline Bills, PA-C  metoprolol succinate (TOPROL-XL) 25 MG 24 hr tablet Take 25 mg by mouth daily. 05/21/18   [provider]  Multiple Vitamin (MULTIVITAMIN WITH MINERALS) TABS tablet Take 1 tablet by mouth daily.    [provider]  NOVOLOG 100 UNIT/ML injection INJECT 40 UNITS DAILY PER INSULIN PUMP (MAX DOSAGE) 08/15/18   Renato Shin, MD  omeprazole (PRILOSEC) 40 MG capsule Take 40 mg by mouth daily before breakfast. 08/05/17   [provider]  PARoxetine (PAXIL) 40 MG tablet Take 1 tablet (40 mg total) by mouth daily. Patient taking differently: Take 20 mg by mouth daily.  11/10/13   Renato Shin, MD  rOPINIRole (REQUIP)  1 MG tablet Take 1 mg by mouth at bedtime.    [provider]  triamcinolone cream (KENALOG) 0.1 % Apply 1 application topically 2 (two) times daily. 08/02/18   Luetta Nutting, DO    Allergies Patient has no known allergies.  Family History  Problem Relation Age of Onset  . Diabetes Mother   . Diabetes Maternal Aunt   . Emphysema Maternal Aunt   . Emphysema Maternal Uncle   . Diabetes Maternal Grandfather   . Lung cancer Maternal Aunt   . Colon cancer Neg Hx   . Kidney disease Neg Hx   . Liver disease Neg Hx     Social History Social History   Tobacco Use  . Smoking status: Current Every Day Smoker    Packs/day: 1.50    Years: 25.00    Pack years: 37.50    Types: Cigarettes  . Smokeless tobacco: Never Used  . Tobacco comment: 1PPD as of 07/2018  Substance Use Topics  . Alcohol use: No    Alcohol/week: 0.0 standard drinks  .  Drug use: No     Review of Systems  Constitutional: No fever/chills Eyes: No visual changes.  Cardiovascular: no chest pain. Respiratory: no cough. No SOB. Gastrointestinal: No abdominal pain.  No nausea, no vomiting.   Musculoskeletal: Positive for knee and lower back pain Skin: Negative for rash, abrasions, lacerations, ecchymosis. Neurological: Negative for headaches, focal weakness or numbness. 10-point ROS otherwise negative.  ____________________________________________   PHYSICAL EXAM:  VITAL SIGNS: ED Triage Vitals  Enc Vitals Group     BP 09/06/18 1506 (!) 168/91     Pulse Rate 09/06/18 1506 95     Resp 09/06/18 1506 20     Temp 09/06/18 1506 97.7 F (36.5 C)     Temp Source 09/06/18 1506 Oral     SpO2 09/06/18 1506 98 %     Weight 09/06/18 1506 130 lb (59 kg)     Height 09/06/18 1506 '5\' 4"'  (1.626 m)     Head Circumference --      Peak Flow --      Pain Score 09/06/18 1511 9     Pain Loc --      Pain Edu? --      Excl. in [place]? --      Constitutional: Alert and oriented. Well appearing and in no acute  distress. Eyes: Conjunctivae are normal. PERRL. EOMI. Head: Atraumatic. Neck: No stridor.    Cardiovascular: Normal rate, regular rhythm. Normal S1 and S2.  Good peripheral circulation. Respiratory: Normal respiratory effort without tachypnea or retractions. Lungs CTAB. Good air entry to the bases with no decreased or absent breath sounds. Musculoskeletal: Full range of motion to all extremities. No gross deformities appreciated.  No gross findings in the lumbar spine on exam.  Diffuse tenderness without point specific tenderness.  Mild tenderness over bilateral sciatic notches.  Dorsalis pedis pulse and sensation intact distally.  Examination of the right knee reveals no gross erythema, edema.  Full range of motion to the knee.  Patient's main tenderness is to palpation over the patella.  No palpable abnormality.  No ballottement.  Varus, valgus, Lachman's, McMurray's is negative. Neurologic:  Normal speech and language. No gross focal neurologic deficits are appreciated.  Skin:  Skin is warm, dry and intact. No rash noted. Psychiatric: Mood and affect are normal. Speech and behavior are normal. Patient exhibits appropriate insight and judgement.   ____________________________________________   LABS (all labs ordered are listed, but only abnormal results are displayed)  Labs Reviewed - No data to display ____________________________________________  EKG   ____________________________________________  RADIOLOGY   No results found.  ____________________________________________    PROCEDURES  Procedure(s) performed:    Procedures    Medications  oxyCODONE-acetaminophen (PERCOCET/ROXICET) 5-325 MG per tablet 1 tablet (has no administration in time range)  meloxicam (MOBIC) tablet 15 mg (has no administration in time range)     ____________________________________________   INITIAL IMPRESSION / ASSESSMENT AND PLAN / ED COURSE  Pertinent labs & imaging results that  were available during my care of the patient were reviewed by me and considered in my medical decision making (see chart for details).  Review of the [place] CSRS was performed in accordance of the [place] prior to dispensing any controlled drugs.      Patient's diagnosis is consistent with low back pain, knee pain, chronic opioid use.  Patient presents emergency department requesting prescription for opioids after her primary care has refused to fill any further prescriptions.  Patient failed a urine drug screen by having no  narcotic or benzo in her system which she was supposed to be taking.  Patient reports that she was taking these medications but has been cut off by her primary care.  At this point, no narcotic or controlled substance prescription will be filled and she may follow-up with either pain management or opted for surgery at this time via her orthopedic surgeon..  I will prescribe meloxicam.  Follow-up with primary care and orthopedics as needed.  Patient is given ED precautions to return to the ED for any worsening or new symptoms.     ____________________________________________  FINAL CLINICAL IMPRESSION(S) / ED DIAGNOSES  Final diagnoses:  Chronic pain of both knees  Chronic midline low back pain with bilateral sciatica      NEW MEDICATIONS STARTED DURING THIS VISIT:  ED Discharge Orders         Ordered    meloxicam (MOBIC) 15 MG tablet  Daily     09/06/18 1652              This chart was dictated using voice recognition software/Dragon. Despite best efforts to proofread, errors can occur which can change the meaning. Any change was purely unintentional.    Darletta Moll, PA-C 09/06/18 1654    Eula Listen, MD 09/09/18 [phone]

## 2018-09-06 NOTE — ED Triage Notes (Signed)
Patient reports history of osteoarthritis in bilateral knees. Reports pain worsening for the last week. Patient states that she has been taking hydrocodone for pain but she started seeing a new provider on 2/9 and has not had any pain medication since then.

## 2018-09-10 ENCOUNTER — Encounter: Payer: Self-pay | Admitting: Physical Medicine & Rehabilitation

## 2018-09-11 DIAGNOSIS — M170 Bilateral primary osteoarthritis of knee: Secondary | ICD-10-CM | POA: Diagnosis not present

## 2018-09-11 DIAGNOSIS — M25561 Pain in right knee: Secondary | ICD-10-CM | POA: Diagnosis not present

## 2018-09-11 DIAGNOSIS — M25562 Pain in left knee: Secondary | ICD-10-CM | POA: Diagnosis not present

## 2018-09-13 DIAGNOSIS — M064 Inflammatory polyarthropathy: Secondary | ICD-10-CM | POA: Insufficient documentation

## 2018-09-16 ENCOUNTER — Telehealth: Payer: Self-pay | Admitting: Family Medicine

## 2018-09-16 DIAGNOSIS — F17210 Nicotine dependence, cigarettes, uncomplicated: Secondary | ICD-10-CM | POA: Diagnosis not present

## 2018-09-16 DIAGNOSIS — M179 Osteoarthritis of knee, unspecified: Secondary | ICD-10-CM | POA: Diagnosis not present

## 2018-09-16 DIAGNOSIS — J383 Other diseases of vocal cords: Secondary | ICD-10-CM | POA: Diagnosis not present

## 2018-09-16 NOTE — Telephone Encounter (Signed)
Received fax from [facility] on this patient. Per note pt called over there weekend c/o nausea and dizzness for the past two days and it had gotten worse esp when riding in a car. She was c/o of a headache and rated pain as 3-4/10. They gave patient at home instructions for the dizziness and informed her to follow up with pcp within 24 hours.  Called pt who states she feels like it is getting better and if it gets any worse she will call back to obtain an appt. Nothing further is needed

## 2018-09-17 ENCOUNTER — Ambulatory Visit: Payer: Self-pay | Admitting: *Deleted

## 2018-09-17 NOTE — Telephone Encounter (Signed)
Pt is coming in tomorrow to see provider will address when she comes in

## 2018-09-17 NOTE — Telephone Encounter (Signed)
Pt called with having dizziness, like the room spinning since last week. She thought it would go away but has not. She has nausea but no vomiting. It is worst with movement, riding in a car. She thinks she may be drinking too much water. Denies fever, or numbness. She has a headache every once and a while. Not severe. Advised to be seen more urgently if she starts having increase in dizziness, vomiting, severe headache or weakness. Pt voiced understanding. Appointment scheduled per pt request. Routing to flow at [facility] at [facility].  Reason for Disposition . [1] MILD dizziness (e.g., vertigo; walking normally) AND [2] has NOT been evaluated by physician for this  Answer Assessment - Initial Assessment Questions 1. DESCRIPTION: "Describe your dizziness."     Room spinning 2. VERTIGO: "Do you feel like either you or the room is spinning or tilting?"      Room spinning 3. LIGHTHEADED: "Do you feel lightheaded?" (e.g., somewhat faint, woozy, weak upon standing)     dizzy 4. SEVERITY: "How bad is it?"  "Can you walk?"   - MILD - Feels unsteady but walking normally.   - MODERATE - Feels very unsteady when walking, but not falling; interferes with normal activities (e.g., school, work) .   - SEVERE - Unable to walk without falling (requires assistance).     Mild to moderate 5. ONSET:  "When did the dizziness begin?"     Sometime last week 6. AGGRAVATING FACTORS: "Does anything make it worse?" (e.g., standing, change in head position)     Change in position and movement makes it worst 7. CAUSE: "What do you think is causing the dizziness?"    Not sure 8. RECURRENT SYMPTOM: "Have you had dizziness before?" If so, ask: "When was the last time?" "What happened that time?"     Think so not sure if she has had something like this before 9. OTHER SYMPTOMS: "Do you have any other symptoms?" (e.g., headache, weakness, numbness, vomiting, earache)     Little weakness, some nausea, headache every  once and a while 10. PREGNANCY: "Is there any chance you are pregnant?" "When was your last menstrual period?"       No LMP had hysterectomy  Protocols used: DIZZINESS - VERTIGO-A-AH

## 2018-09-18 ENCOUNTER — Encounter: Payer: Self-pay | Admitting: Family Medicine

## 2018-09-18 ENCOUNTER — Ambulatory Visit (INDEPENDENT_AMBULATORY_CARE_PROVIDER_SITE_OTHER): Payer: 59 | Admitting: Family Medicine

## 2018-09-18 VITALS — Temp 98.6°F | Ht 64.0 in | Wt 125.0 lb

## 2018-09-18 DIAGNOSIS — R42 Dizziness and giddiness: Secondary | ICD-10-CM | POA: Insufficient documentation

## 2018-09-18 DIAGNOSIS — E109 Type 1 diabetes mellitus without complications: Secondary | ICD-10-CM | POA: Diagnosis not present

## 2018-09-18 HISTORY — DX: Dizziness and giddiness: R42

## 2018-09-18 LAB — CBC WITH DIFFERENTIAL/PLATELET
BASOS ABS: 0 10*3/uL (ref 0.0–0.1)
Basophils Relative: 0.7 % (ref 0.0–3.0)
Eosinophils Absolute: 0 10*3/uL (ref 0.0–0.7)
Eosinophils Relative: 0.5 % (ref 0.0–5.0)
HEMATOCRIT: 43.4 % (ref 36.0–46.0)
HEMOGLOBIN: 14.9 g/dL (ref 12.0–15.0)
LYMPHS PCT: 22.8 % (ref 12.0–46.0)
Lymphs Abs: 1.3 10*3/uL (ref 0.7–4.0)
MCHC: 34.2 g/dL (ref 30.0–36.0)
MCV: 91.3 fl (ref 78.0–100.0)
MONOS PCT: 6.2 % (ref 3.0–12.0)
Monocytes Absolute: 0.4 10*3/uL (ref 0.1–1.0)
Neutro Abs: 4.1 10*3/uL (ref 1.4–7.7)
Neutrophils Relative %: 69.8 % (ref 43.0–77.0)
Platelets: 202 10*3/uL (ref 150.0–400.0)
RBC: 4.75 Mil/uL (ref 3.87–5.11)
RDW: 12.9 % (ref 11.5–15.5)
WBC: 5.9 10*3/uL (ref 4.0–10.5)

## 2018-09-18 LAB — BASIC METABOLIC PANEL
BUN: 7 mg/dL (ref 6–23)
CHLORIDE: 100 meq/L (ref 96–112)
CO2: 28 meq/L (ref 19–32)
Calcium: 9.4 mg/dL (ref 8.4–10.5)
Creatinine, Ser: 0.65 mg/dL (ref 0.40–1.20)
GFR: 99.75 mL/min (ref >60.00)
Glucose, Bld: 225 mg/dL — ABNORMAL HIGH (ref 70–99)
Potassium: 4.1 mEq/L (ref 3.5–5.1)
Sodium: 134 mEq/L — ABNORMAL LOW (ref 135–145)

## 2018-09-18 LAB — POCT URINALYSIS DIPSTICK
Bilirubin, UA: NEGATIVE
Blood, UA: NEGATIVE
Glucose, UA: POSITIVE — AB
Ketones, UA: NEGATIVE
Leukocytes, UA: NEGATIVE
NITRITE UA: NEGATIVE
PROTEIN UA: NEGATIVE
Urobilinogen, UA: 0.2 E.U./dL
pH, UA: 6 (ref 5.0–8.0)

## 2018-09-18 LAB — POCT CBG (FASTING - GLUCOSE)-MANUAL ENTRY: Glucose Fasting, POC: 218 mg/dL — AB (ref 70–99)

## 2018-09-18 MED ORDER — MECLIZINE HCL 25 MG PO TABS: 25.0000 mg | ORAL_TABLET | Freq: Three times a day (TID) | ORAL | 0 refills | Status: DC | PRN

## 2018-09-18 NOTE — Assessment & Plan Note (Addendum)
-  She does have some mild tachycardia, ?dehydration.  DKA also in differential.  -Blood sugar in clinic 218 -Urine without ketones, which is reassuring.  -Check stat BMP to evaluate sodium/potassium/anion gap.   -Stat CBC with diff -Will call with results recommendations.  -Recommend increased fluid intake at home, avoid sweetened beverages.

## 2018-09-18 NOTE — Progress Notes (Signed)
MONIKE BRAGDON - 43 y.o. female MRN [id number]  Date of birth: 04/04/1976  Subjective Chief Complaint  Patient presents with  . Acute Visit  . Dizziness    x 1 week, notcied some achyness in shoulder and neck, did not taken meclizine due to taking hydrocodone   . Nausea    HPI SAYDIE GERDTS is a 43 y.o. female wit history of T1DM here today with complaint of dizziness.  She reports that dizziness began about 1 week ago and has been worsening.  She has been more thirsty and feels like she can't get enough to drink.  Dizziness worsened with movement or riding in a car.  Reports blood sugar this morning was 109.  She has had some associated nausea but denies abdominal pain or increased fatigue.  She denies prior history of DKA.    ROS:  A comprehensive ROS was completed and negative except as noted per HPI  No Known Allergies  Past Medical History:  Diagnosis Date  . Anxiety state, unspecified 03/26/2009  . ASTHMA 02/02/2007  . DEPRESSION 02/16/2009  . DIABETES MELLITUS, TYPE I 02/02/2007  . Endometriosis   . GERD 12/21/2008  . Hyperlipidemia   . Lesion of vocal cord   . Shortness of breath 09/25/2007  . Vocal fold leukoplakia     Past Surgical History:  Procedure Laterality Date  . ABDOMINAL HYSTERECTOMY  2009   BSO  . DIRECT LARYNGOSCOPY N/A 08/17/2017   Procedure: DIRECT LARYNGOSCOPY WITH OPERATING TELESCOPE WITH BIOPSY;  Surgeon: Helayne Seminole, MD;  Location: [place];  Service: ENT;  Laterality: N/A;  . NASOPHARYNGEAL BIOPSY Right 08/17/2017   Procedure: NASOPHARYNGEAL BIOPSY RIGHT NASAL LESION;  Surgeon: Helayne Seminole, MD;  Location: [place];  Service: ENT;  Laterality: Right;    Social History   Socioeconomic History  . Marital status: Legally Separated    Spouse name: Not on file  . Number of children: 2  . Years of education: Not on file  . Highest education level: Not on file  Occupational History    Employer: UNEMPLOYED  Social Needs  . Financial  resource strain: Not on file  . Food insecurity:    Worry: Not on file    Inability: Not on file  . Transportation needs:    Medical: Not on file    Non-medical: Not on file  Tobacco Use  . Smoking status: Current Every Day Smoker    Packs/day: 1.50    Years: 25.00    Pack years: 37.50    Types: Cigarettes  . Smokeless tobacco: Never Used  . Tobacco comment: 1PPD as of 07/2018  Substance and Sexual Activity  . Alcohol use: No    Alcohol/week: 0.0 standard drinks  . Drug use: No  . Sexual activity: Not on file  Lifestyle  . Physical activity:    Days per week: Not on file    Minutes per session: Not on file  . Stress: Not on file  Relationships  . Social connections:    Talks on phone: Not on file    Gets together: Not on file    Attends religious service: Not on file    Active member of club or organization: Not on file    Attends meetings of clubs or organizations: Not on file    Relationship status: Not on file  Other Topics Concern  . Not on file  Social History Narrative   2 children--pt has custody. Husband pays child support.  Daily Caffeine Use-3 2 liters a day   Pt does not get regular exercise.    Family History  Problem Relation Age of Onset  . Diabetes Mother   . Diabetes Maternal Aunt   . Emphysema Maternal Aunt   . Emphysema Maternal Uncle   . Diabetes Maternal Grandfather   . Lung cancer Maternal Aunt   . Colon cancer Neg Hx   . Kidney disease Neg Hx   . Liver disease Neg Hx     Health Maintenance  Topic Date Due  . PNEUMOCOCCAL POLYSACCHARIDE VACCINE AGE 67-64 HIGH RISK  04/14/1978  . TETANUS/TDAP  07/18/2009  . PAP SMEAR-Modifier  02/17/2012  . URINE MICROALBUMIN  12/30/2016  . OPHTHALMOLOGY EXAM  10/19/2017  . HEMOGLOBIN A1C  03/23/2018  . FOOT EXAM  09/21/2018  . INFLUENZA VACCINE  Completed  . HIV Screening  Completed     ----------------------------------------------------------------------------------------------------------------------------------------------------------------------------------------------------------------- Physical Exam Temp 98.6 F (37 C) (Oral)   Ht 5\' 4"  (1.626 m)   Wt 125 lb (56.7 kg)   BMI 21.46 kg/m   Physical Exam Constitutional:      Appearance: Normal appearance.     Comments: No acetone/ketone odor noted.   HENT:     Head: Normocephalic and atraumatic.     Right Ear: Tympanic membrane normal.     Left Ear: Tympanic membrane normal.     Nose: Nose normal.     Mouth/Throat:     Mouth: Mucous membranes are moist.  Eyes:     General: No scleral icterus. Neck:     Musculoskeletal: Neck supple.  Cardiovascular:     Rate and Rhythm: Normal rate and regular rhythm.  Pulmonary:     Effort: Pulmonary effort is normal.     Breath sounds: Normal breath sounds.  Skin:    General: Skin is warm and dry.     Findings: No rash.  Neurological:     General: No focal deficit present.     Mental Status: She is alert.  Psychiatric:        Mood and Affect: Mood normal.        Behavior: Behavior normal.     ------------------------------------------------------------------------------------------------------------------------------------------------------------------------------------------------------------------- Assessment and Plan  Dizziness -She does have some mild tachycardia, ?dehydration.  DKA also in differential.  -Blood sugar in clinic 218 -Urine without ketones, which is reassuring.  -Check stat BMP to evaluate sodium/potassium/anion gap.   -Stat CBC with diff -Will call with results recommendations.  -Recommend increased fluid intake at home, avoid sweetened beverages.

## 2018-09-18 NOTE — Progress Notes (Signed)
-  Glucose elevated, similar to in office reading -Sodium is at 134, stable and when corrected to elevated sugar is actually closer to 136-137.  -No signs of anemia -She should push fluids -Ok to take meclizine as directed PRN.  If worsening or continuing past a few days let me know.

## 2018-09-26 ENCOUNTER — Other Ambulatory Visit: Payer: Self-pay | Admitting: Family Medicine

## 2018-09-26 MED ORDER — PAROXETINE HCL 40 MG PO TABS: 20.0000 mg | ORAL_TABLET | Freq: Every day | ORAL | 1 refills | Status: DC

## 2018-09-26 MED ORDER — GABAPENTIN 300 MG PO CAPS: 300.0000 mg | ORAL_CAPSULE | Freq: Three times a day (TID) | ORAL | 1 refills | Status: DC

## 2018-09-26 MED ORDER — ROPINIROLE HCL 1 MG PO TABS: 1.0000 mg | ORAL_TABLET | Freq: Every day | ORAL | 1 refills | Status: DC

## 2018-09-26 MED ORDER — ATORVASTATIN CALCIUM 10 MG PO TABS: 10.0000 mg | ORAL_TABLET | Freq: Every day | ORAL | 1 refills | Status: DC

## 2018-09-26 NOTE — Telephone Encounter (Signed)
Requested medication (s) are due for refill today: Yes  Requested medication (s) are on the active medication list: Yes  Last refill:  By other providers  Future visit scheduled: No  Notes to clinic:  Unable to refill, expired Rx, refilled by another provider     Requested Prescriptions  Pending Prescriptions Disp Refills   PARoxetine (PAXIL) 40 MG tablet 30 tablet 0    Sig: Take 1 tablet (40 mg total) by mouth daily.     Psychiatry:  Antidepressants - SSRI Passed - 09/26/2018 10:20 AM      Passed - Valid encounter within last 6 months    Recent Outpatient Visits          1 week ago Dizziness   [facility] [place], [place], DO   1 month ago Low back pain with left-sided sciatica, unspecified back pain laterality, unspecified chronicity   [facility] [place], [place], DO   1 month ago Arthralgia, unspecified joint   [facility] [place], [place], DO   1 month ago Chest wall pain   [facility] [place], [place], DO   6 years ago [employer], [facility] -Areatha Keas, MD      Future Appointments            In 1 month Margaretha Seeds, MD [facility]          rOPINIRole (REQUIP) 1 MG tablet      Sig: Take 1 tablet (1 mg total) by mouth at bedtime.     Neurology:  Parkinsonian Agents Passed - 09/26/2018 10:20 AM      Passed - Last BP in normal range    BP Readings from Last 1 Encounters:  09/06/18 136/85         Passed - Valid encounter within last 12 months    Recent Outpatient Visits          1 week ago Dizziness   [facility] [place], [place], DO   1 month ago Low back pain with left-sided sciatica, unspecified back pain laterality, unspecified chronicity   [facility] [place], [place], DO   1 month ago Arthralgia, unspecified joint   [facility] [place], [place], DO   1  month ago Chest wall pain   [facility] [place], [place], DO   6 years ago [place], [place], [place], MD      Future Appointments            In 1 month Margaretha Seeds, MD [facility]          atorvastatin (LIPITOR) 10 MG tablet      Sig: Take 1 tablet (10 mg total) by mouth at bedtime.     Cardiovascular:  Antilipid - Statins Failed - 09/26/2018 10:20 AM      Failed - Total Cholesterol in normal range and within 360 days    Cholesterol  Date Value Ref Range Status  05/12/2010 220 (H) 0 - 200 mg/dL Final    Comment:    See lab report for associated comment(s)         Failed - LDL in normal range and within 360 days    No results found for: LDLCALC, LDLC, HIRISKLDL       Failed - HDL in normal range and within 360 days  HDL  Date Value Ref Range Status  05/12/2010 49.90 >39.00 mg/dL Final         Failed - Triglycerides in normal range and within 360 days    Triglycerides  Date Value Ref Range Status  05/12/2010 200.0 (H) 0.0 - 149.0 mg/dL Final    Comment:    See lab report for associated comment(s)         Passed - Patient is not pregnant      Passed - Valid encounter within last 12 months    Recent Outpatient Visits          1 week ago Dizziness   [facility] [place], [place], DO   1 month ago Low back pain with left-sided sciatica, unspecified back pain laterality, unspecified chronicity   [facility] [place], [place], DO   1 month ago Arthralgia, unspecified joint   [facility] [place], [place], DO   1 month ago Chest wall pain   [facility] [place], [place], DO   6 years ago [place], [facility] -Areatha Keas, MD      Future Appointments            In 1 month Margaretha Seeds, MD [facility]          gabapentin (NEURONTIN)  300 MG capsule      Sig: Take by mouth 3 (three) times daily. As needed     Neurology: Anticonvulsants - gabapentin Passed - 09/26/2018 10:20 AM      Passed - Valid encounter within last 12 months    Recent Outpatient Visits          1 week ago Dizziness   [facility] [place], [place], DO   1 month ago Low back pain with left-sided sciatica, unspecified back pain laterality, unspecified chronicity   [facility] [place], [place], DO   1 month ago Arthralgia, unspecified joint   [facility] [place], [place], DO   1 month ago Chest wall pain   [facility] [place], [place], DO   6 years ago [place], [place] [facility] -Areatha Keas, MD      Future Appointments            In 1 month Margaretha Seeds, MD [facility]

## 2018-09-26 NOTE — Telephone Encounter (Signed)
Copied from [place] [id number]. Topic: Quick Communication - Rx Refill/Question >> Sep 26, 2018 10:12 AM Keene Breath wrote: Medication: PARoxetine (PAXIL) 40 MG tablet / rOPINIRole (REQUIP) 1 MG tablet / gabapentin (NEURONTIN) 300 MG capsule / atorvastatin (LIPITOR) 10 MG tablet  Patient called to request a refill for the above medications  Preferred Pharmacy (with phone number or street name): [id number] [place], [place]. [phone] (Phone) [phone] (Fax)

## 2018-10-03 ENCOUNTER — Encounter: Payer: 59 | Attending: Physical Medicine & Rehabilitation

## 2018-10-03 ENCOUNTER — Encounter: Payer: Self-pay | Admitting: Physical Medicine & Rehabilitation

## 2018-10-03 ENCOUNTER — Ambulatory Visit (HOSPITAL_BASED_OUTPATIENT_CLINIC_OR_DEPARTMENT_OTHER): Payer: 59 | Admitting: Physical Medicine & Rehabilitation

## 2018-10-03 ENCOUNTER — Other Ambulatory Visit: Payer: Self-pay

## 2018-10-03 VITALS — BP 143/77 | HR 112 | Resp 14 | Ht 64.0 in | Wt 124.0 lb

## 2018-10-03 DIAGNOSIS — Z836 Family history of other diseases of the respiratory system: Secondary | ICD-10-CM | POA: Diagnosis not present

## 2018-10-03 DIAGNOSIS — F17210 Nicotine dependence, cigarettes, uncomplicated: Secondary | ICD-10-CM | POA: Diagnosis not present

## 2018-10-03 DIAGNOSIS — M545 Low back pain: Secondary | ICD-10-CM | POA: Insufficient documentation

## 2018-10-03 DIAGNOSIS — Z833 Family history of diabetes mellitus: Secondary | ICD-10-CM | POA: Diagnosis not present

## 2018-10-03 DIAGNOSIS — E119 Type 2 diabetes mellitus without complications: Secondary | ICD-10-CM | POA: Diagnosis not present

## 2018-10-03 DIAGNOSIS — M238X2 Other internal derangements of left knee: Secondary | ICD-10-CM | POA: Diagnosis not present

## 2018-10-03 DIAGNOSIS — Z801 Family history of malignant neoplasm of trachea, bronchus and lung: Secondary | ICD-10-CM | POA: Insufficient documentation

## 2018-10-03 DIAGNOSIS — Z5181 Encounter for therapeutic drug level monitoring: Secondary | ICD-10-CM

## 2018-10-03 DIAGNOSIS — Z79891 Long term (current) use of opiate analgesic: Secondary | ICD-10-CM | POA: Diagnosis not present

## 2018-10-03 DIAGNOSIS — G8929 Other chronic pain: Secondary | ICD-10-CM | POA: Diagnosis not present

## 2018-10-03 DIAGNOSIS — G894 Chronic pain syndrome: Secondary | ICD-10-CM | POA: Diagnosis not present

## 2018-10-03 DIAGNOSIS — M238X1 Other internal derangements of right knee: Secondary | ICD-10-CM | POA: Diagnosis not present

## 2018-10-03 DIAGNOSIS — J45909 Unspecified asthma, uncomplicated: Secondary | ICD-10-CM | POA: Insufficient documentation

## 2018-10-03 MED ORDER — DIAZEPAM 10 MG PO TABS: 10.0000 mg | ORAL_TABLET | Freq: Four times a day (QID) | ORAL | 0 refills | Status: DC | PRN

## 2018-10-03 NOTE — Progress Notes (Signed)
Subjective:    Patient ID: [name], female    DOB: 07-15-1976, 42 y.o.   MRN: [id number]  HPI  43 year old female who has history of type 1 diabetes with primary complaint of bilateral knee pain. The patient states she has had knee pain for about 10 years.  She has seen orthopedic surgery for this.  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  The patient states she has not had any falls or trauma recently. She has been prescribed narcotic analgesics in the past for this.  Her last urine screen was in February and did not show signs of hydrocodone or alprazolam both of which she claims she was taking.  She has had corticosteroid injections before but these caused elevation of blood sugar.  In addition patient complains of low back pain.  She states she has been diagnosed with isthmic something.  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  This is a grade 1, mild.  She has no radiating pain from the back down to her legs or feet.  She complains of back stiffness early in the morning. Pain Inventory Average Pain 10 Pain Right Now 9 My pain is sharp, burning, dull, stabbing and aching  In the last 24 hours, has pain interfered with the following? General activity 0 Relation with others 1 Enjoyment of life 1 What TIME of day is your pain at its worst? varies Sleep (in general) Good  Pain is worse with: walking, bending and standing Pain improves with: heat/ice and medication Relief from Meds: 9  Mobility ability to climb steps?  no do you drive?  yes Do you have any goals in this area?  yes  Function disabled: date disabled .  Neuro/Psych weakness anxiety  Prior Studies new  Physicians involved in your care new   Family History  Problem Relation Age of Onset  . Diabetes Mother   . Diabetes Maternal Aunt   . Emphysema Maternal Aunt   . Emphysema Maternal Uncle   . Diabetes Maternal  Grandfather   . Lung cancer Maternal Aunt   . Colon cancer Neg Hx   . Kidney disease Neg Hx   . Liver disease Neg Hx    Social History   Socioeconomic History  . Marital status: Legally Separated    Spouse name: Not on file  . Number of children: 2  . Years of education: Not on file  . Highest education level: Not on file  Occupational History    Employer: UNEMPLOYED  Social Needs  . Financial resource strain: Not on file  . Food insecurity:    Worry: Not on file    Inability: Not on file  . Transportation needs:    Medical: Not on file    Non-medical: Not on file  Tobacco Use  . Smoking status: Current Every Day Smoker    Packs/day: 1.50    Years: 25.00    Pack years: 37.50    Types: Cigarettes  . Smokeless tobacco: Never Used  . Tobacco comment: 1PPD as of 07/2018  Substance and Sexual Activity  . Alcohol use: No    Alcohol/week: 0.0 standard drinks  . Drug use: No  . Sexual activity: Not on file  Lifestyle  . Physical activity:    Days per week: Not on file    Minutes per session: Not on file  . Stress: Not on file  Relationships  . Social connections:  Talks on phone: Not on file    Gets together: Not on file    Attends religious service: Not on file    Active member of club or organization: Not on file    Attends meetings of clubs or organizations: Not on file    Relationship status: Not on file  Other Topics Concern  . Not on file  Social History Narrative   2 children--pt has custody. Husband pays child support.   Daily Caffeine Use-3 2 liters a day   Pt does not get regular exercise.   Past Surgical History:  Procedure Laterality Date  . ABDOMINAL HYSTERECTOMY  2009   BSO  . DIRECT LARYNGOSCOPY N/A 08/17/2017   Procedure: DIRECT LARYNGOSCOPY WITH OPERATING TELESCOPE WITH BIOPSY;  Surgeon: Helayne Seminole, MD;  Location: [place];  Service: ENT;  Laterality: N/A;  . NASOPHARYNGEAL BIOPSY Right 08/17/2017   Procedure: NASOPHARYNGEAL BIOPSY RIGHT  NASAL LESION;  Surgeon: Helayne Seminole, MD;  Location: [place];  Service: ENT;  Laterality: Right;   Past Medical History:  Diagnosis Date  . Anxiety state, unspecified 03/26/2009  . ASTHMA 02/02/2007  . DEPRESSION 02/16/2009  . DIABETES MELLITUS, TYPE I 02/02/2007  . Endometriosis   . GERD 12/21/2008  . Hyperlipidemia   . Lesion of vocal cord   . Shortness of breath 09/25/2007  . Vocal fold leukoplakia    BP (!) 143/77   Pulse (!) 112   Resp 14   Ht 5\' 4"  (1.626 m)   Wt 124 lb (56.2 kg)   SpO2 95%   BMI 21.28 kg/m   Opioid Risk Score:   Fall Risk Score:  `1  Depression screen PHQ 2/9  Depression screen [facility] 2/9 10/03/2018 12/07/2015  Decreased Interest 0 0  Down, Depressed, Hopeless 0 0  PHQ - 2 Score 0 0  Some recent data might be hidden   Review of Systems  Constitutional: Negative.   HENT: Negative.   Eyes: Negative.   Respiratory: Negative.   Cardiovascular: Negative.   Gastrointestinal: Negative.   Endocrine: Negative.   Genitourinary: Negative.   Musculoskeletal: Positive for arthralgias and back pain.  Skin: Negative.   Allergic/Immunologic: Negative.   Neurological: Positive for weakness.  Psychiatric/Behavioral: The patient is nervous/anxious.   All other systems reviewed and are negative.      Objective:   Physical Exam Vitals signs and nursing note reviewed.  Constitutional:      Appearance: Normal appearance.  HENT:     Head: Normocephalic and atraumatic.     Mouth/Throat:     Mouth: Mucous membranes are moist.     Pharynx: Oropharynx is clear.  Eyes:     Extraocular Movements: Extraocular movements intact.     Conjunctiva/sclera: Conjunctivae normal.     Pupils: Pupils are equal, round, and reactive to light.  Cardiovascular:     Rate and Rhythm: Normal rate and regular rhythm.     Heart sounds: No murmur.  Pulmonary:     Effort: Pulmonary effort is normal.     Breath sounds: No stridor. Wheezing present. No rales.  Abdominal:      General: Abdomen is flat. Bowel sounds are normal. There is no distension.     Palpations: Abdomen is soft.  Musculoskeletal:     Right knee: She exhibits normal range of motion, no swelling and no effusion. No tenderness found.     Left knee: She exhibits normal range of motion, no swelling and no effusion. No tenderness found.  Neurological:  Mental Status: She is alert.     Comments: Motor strength is 5/5 bilateral deltoid, bicep, tricep, grip, hip flexor, knee extensor, ankle dorsiflexion plantar flexor Negative straight leg raising Sensation intact bilaterally at the C5 C6-C7-C8 L2-L3-L4 L5-S1 dermatome distribution to pinprick and light touch.   Psychiatric:        Attention and Perception: Attention normal.        Mood and Affect: Mood is anxious. Affect is labile.        Speech: Speech normal.        Behavior: Behavior normal.        Cognition and Memory: Cognition normal.        Judgment: Judgment is not impulsive or inappropriate.    There is crepitus bilaterally at the patellar region with knee extension. Ambulates without assist device no evidence of toe drag or knee instability no antalgia noted.   Lumbar spine has no tenderness palpation. She has reduced range of motion approximately 50% flexion extension lateral bending and rotation. She has no spinal deformity noted.  No step-off.  No pelvic obliquity.    Assessment & Plan:  1.  Bilateral knee pain with patellofemoral crepitus, we discussed use of Voltaren gel she states that she took did it twice a day and we discussed that it would be more helpful on a 4 times daily basis. We discussed UDS demonstrating no evidence of opioid or benzodiazepine during the time that patient states she was taking these medications.  We discussed that in this situation I would not feel comfortable prescribing this type of pain medication. We discussed interventional pain procedures including genicular nerve block.  Would recommend doing  the right side first given her increased symptomatology on that side.  We can premedicate patient with Valium 10 mg taken as soon as patient arrives in clinic   2.  Chronic low back pain without sciatic features.  This may be related to her lumbar spondylolisthesis.  She has mildly restricted range of motion no significant pain palpation or deformity.  Medial branch blocks bilateral L4 and bilateral L5 dorsal ramus injections may be an option but given this is a secondary complaint will not schedule at this time.

## 2018-10-28 ENCOUNTER — Ambulatory Visit: Payer: 59 | Admitting: Pulmonary Disease

## 2018-10-30 ENCOUNTER — Encounter: Payer: Self-pay | Admitting: Nurse Practitioner

## 2018-10-30 ENCOUNTER — Ambulatory Visit (INDEPENDENT_AMBULATORY_CARE_PROVIDER_SITE_OTHER): Payer: 59 | Admitting: Nurse Practitioner

## 2018-10-30 ENCOUNTER — Telehealth: Payer: Self-pay | Admitting: Nurse Practitioner

## 2018-10-30 ENCOUNTER — Other Ambulatory Visit: Payer: Self-pay

## 2018-10-30 DIAGNOSIS — J4520 Mild intermittent asthma, uncomplicated: Secondary | ICD-10-CM | POA: Diagnosis not present

## 2018-10-30 MED ORDER — BUDESONIDE-FORMOTEROL FUMARATE 80-4.5 MCG/ACT IN AERO: 2.0000 | INHALATION_SPRAY | Freq: Two times a day (BID) | RESPIRATORY_TRACT | 5 refills | Status: DC

## 2018-10-30 NOTE — Assessment & Plan Note (Signed)
Discussion: Patient was last seen by me on 08/12/2018 and was started on Symbicort.  She was previously trialed on Spiriva, but quit taking her Spiriva due to it causing low blood sugars.  She states that she has felt improvement with the Symbicort.  She takes albuterol occasionally as needed.  Unfortunately, patient continues to smoke.  She has not attempted to quit.  She states that she does still have intermittent shortness of breath.  She states that the shortness of breath is improved from previous.  Patient Instructions  Continue symbicort 80/4.5 2 puffs twice daily Continue albuterol as needed Please quit smoking   Follow up with Dr. [name] or me in 4 months or sooner if needed

## 2018-10-30 NOTE — Patient Instructions (Signed)
Continue symbicort 80/4.5 2 puffs twice daily Continue albuterol as needed Please quit smoking   Follow up with Dr. [name] or me in 4 months or sooner if needed

## 2018-10-30 NOTE — Telephone Encounter (Signed)
Rx of symbicort sent to pt's preferred pharmacy. Called and spoke with pt letting her know this had been done. Pt expressed understanding. Nothing further needed.

## 2018-10-30 NOTE — Progress Notes (Signed)
Virtual Visit via Telephone Note  I connected with [name] on 10/30/18 at 11:00 AM EDT by telephone and verified that I am speaking with the correct person using two identifiers.   I discussed the limitations, risks, security and privacy concerns of performing an evaluation and management service by telephone and the availability of in person appointments. I also discussed with the patient that there may be a patient responsible charge related to this service. The patient expressed understanding and agreed to proceed.   History of Present Illness: 43 year old female active smoker with history of asthma followed by Dr. [name].  Patient has a tele-visit today for a follow-up.  She was last seen by me on 08/12/2018 and was started on Symbicort.  She was previously  on Spiriva, but quit taking her Spiriva due to it causing low blood sugars.  She states that she has felt improvement with the Symbicort.  She takes albuterol occasionally as needed.  Unfortunately, patient continues to smoke.  She has not attempted to quit.  She states that she does still have intermittent shortness of breath.  She states that the shortness of breath is improved from previous. Denies f/c/s, n/v/d, hemoptysis, PND, leg swelling.     Observations/Objective: Imaging: CXR 11/15/219>>There is no acute pneumonia nor CHF. Mildly increased lung markings bilaterally are compatible with reactive airway disease and/or COPD in the patient's smoking history. CTA 05/10/18>>No thoracic aortic aneurysm or dissection. No demonstrable pulmonary embolus. No edema or consolidation. 2 mm nodular opacity in the left upper lobe anteriorly. There is no nodular opacity in the right upper lobe as well as question on recent chest radiograph. It is possible that the nodular opacity question on chest radiograph represents a bone island. No follow-up needed if patient is low-risk. Non-contrast chest CT can be considered in 12 months if  patient is high-risk.  PFT: PFT Results Latest Ref Rng & Units 08/12/2018  FVC-Pre L 3.25  FVC-Predicted Pre % 88  FVC-Post L 3.28  FVC-Predicted Post % 89  Pre FEV1/FVC % % 70  Post FEV1/FCV % % 78  FEV1-Pre L 2.28  FEV1-Predicted Pre % 76  FEV1-Post L 2.55  DLCO UNC% % 82  DLCO COR %Predicted % 96  TLC L 5.23  TLC % Predicted % 103  RV % Predicted % 137     Assessment and Plan: Discussion: Patient was last seen by me on 08/12/2018 and was started on Symbicort.  She was previously trialed on Spiriva, but quit taking her Spiriva due to it causing low blood sugars.  She states that she has felt improvement with the Symbicort.  She takes albuterol occasionally as needed.  Unfortunately, patient continues to smoke.  She has not attempted to quit.  She states that she does still have intermittent shortness of breath.  She states that the shortness of breath is improved from previous.  Patient Instructions  Continue symbicort 80/4.5 2 puffs twice daily Continue albuterol as needed Please quit smoking    Follow Up Instructions:  Follow up with Dr. [name] or me in 4 months or sooner if needed    I discussed the assessment and treatment plan with the patient. The patient was provided an opportunity to ask questions and all were answered. The patient agreed with the plan and demonstrated an understanding of the instructions.   The patient was advised to call back or seek an in-person evaluation if the symptoms worsen or if the condition fails to improve as anticipated.  I provided 22 minutes of non-face-to-face time during this encounter.   Fenton Foy, NP

## 2018-11-05 ENCOUNTER — Other Ambulatory Visit: Payer: Self-pay | Admitting: Endocrinology

## 2018-11-11 ENCOUNTER — Ambulatory Visit (INDEPENDENT_AMBULATORY_CARE_PROVIDER_SITE_OTHER): Payer: 59 | Admitting: Family Medicine

## 2018-11-11 ENCOUNTER — Encounter: Payer: Self-pay | Admitting: Family Medicine

## 2018-11-11 DIAGNOSIS — G894 Chronic pain syndrome: Secondary | ICD-10-CM | POA: Diagnosis not present

## 2018-11-11 DIAGNOSIS — F419 Anxiety disorder, unspecified: Secondary | ICD-10-CM | POA: Diagnosis not present

## 2018-11-13 ENCOUNTER — Encounter: Payer: Self-pay | Admitting: Family Medicine

## 2018-11-13 DIAGNOSIS — G894 Chronic pain syndrome: Secondary | ICD-10-CM | POA: Insufficient documentation

## 2018-11-13 NOTE — Assessment & Plan Note (Signed)
-  Multifactorial with neuropathy and arthropathy.  -I suspect the majority of this may be coming from her diabetes, once again encouraged to schedule f/u with endocrinology.  -I declined to provide opioid medication.  Discussed that if primary care sports medicine provider felt that this is something she needed to continue on she would need to have him continue to write this.  -She will have notes sent over form Dr. Delilah Shan as well.   -She will continue meloxicam, gabapentin and topical diclofenac.

## 2018-11-13 NOTE — Progress Notes (Addendum)
[name] - 43 y.o. female MRN [id number]  Date of birth: 07/27/1975   This visit type was conducted due to national recommendations for restrictions regarding the COVID-19 Pandemic (e.g. social distancing).  This format is felt to be most appropriate for this patient at this time.  All issues noted in this document were discussed and addressed.  No physical exam was performed (except for noted visual exam findings with Video Visits).  I discussed the limitations of evaluation and management by telemedicine and the availability of in person appointments. The patient expressed understanding and agreed to proceed.  I connected with@ on 11/11/18 at  2:30 PM EDT by a video enabled telemedicine application and verified that I am speaking with the correct person using two identifiers.  Interactive audio and video telecommunications were attempted between this provider and patient, however failed, due to patient having technical difficulties OR patient did not have access to video capability.  We continued and completed visit with audio only.     Patient Location: Home [place] [address]   Provider location:   [facility]  Chief Complaint  Patient presents with  . Follow-up    about of test preformed months ago for Knees/back pain     HPI  [name] is a 43 y.o. female who presents via audio/video conferencing for a telehealth visit today.  She is following up today for chronic pain and anxiety.    -Chronic pain:  She has a history of chronic low back, joint pain and neuropathic pain.  She has  been treated with hydrocodone 7.5/325mg.  This was being managed by her previous pcp. She had requested that I take over prescribing this until she was able to see pain management, however UDS was negative for opioids and and benzodiazepine despite having active prescription for these.  I declined to continue these medications and offered alternatives to opioids  including continuing NSAIDS and gabapentin.  She was also referred to pain management and seen on 10/03/2018 and offered non-opioid alternatives for management of her pain as well.   She has since followed up with Dr. Delilah Shan at her orthopedic office who has started managing her opioids with refills provided over the past 2 months.  She reports today that he has told her that her pcp will need to take over her prescription to continue to receive these.  She also mentions that Dr. Delilah Shan thinks that she has RA.  She reports that he has not checked any lab testing for this.  She did have relatively normal ESR and CRP in 07/2018.    -Anxiety:  Currently managed with paxil.  She was previously on alprazolam as well however UDS was negative for this. Reports she is taking paxil regularly however anxiety is still an issue.  She reports trying other medications in the past but isn't sure what she has tried previously.   ROS:  A comprehensive ROS was completed and negative except as noted per HPI  Past Medical History:  Diagnosis Date  . Anxiety state, unspecified 03/26/2009  . ASTHMA 02/02/2007  . DEPRESSION 02/16/2009  . DIABETES MELLITUS, TYPE I 02/02/2007  . Endometriosis   . GERD 12/21/2008  . Hyperlipidemia   . Lesion of vocal cord   . Shortness of breath 09/25/2007  . Vocal fold leukoplakia     Past Surgical History:  Procedure Laterality Date  . ABDOMINAL HYSTERECTOMY  2009   BSO  . DIRECT LARYNGOSCOPY N/A 08/17/2017  Procedure: DIRECT LARYNGOSCOPY WITH OPERATING TELESCOPE WITH BIOPSY;  Surgeon: Helayne Seminole, MD;  Location: [facility];  Service: ENT;  Laterality: N/A;  . NASOPHARYNGEAL BIOPSY Right 08/17/2017   Procedure: NASOPHARYNGEAL BIOPSY RIGHT NASAL LESION;  Surgeon: Helayne Seminole, MD;  Location: [facility];  Service: ENT;  Laterality: Right;    Family History  Problem Relation Age of Onset  . Diabetes Mother   . Diabetes Maternal Aunt   . Emphysema Maternal Aunt   . Emphysema  Maternal Uncle   . Diabetes Maternal Grandfather   . Lung cancer Maternal Aunt   . Colon cancer Neg Hx   . Kidney disease Neg Hx   . Liver disease Neg Hx     Social History   Socioeconomic History  . Marital status: Legally Separated    Spouse name: Not on file  . Number of children: 2  . Years of education: Not on file  . Highest education level: Not on file  Occupational History    Employer: UNEMPLOYED  Social Needs  . Financial resource strain: Not on file  . Food insecurity:    Worry: Not on file    Inability: Not on file  . Transportation needs:    Medical: Not on file    Non-medical: Not on file  Tobacco Use  . Smoking status: Current Every Day Smoker    Packs/day: 1.50    Years: 25.00    Pack years: 37.50    Types: Cigarettes  . Smokeless tobacco: Never Used  . Tobacco comment: 1PPD as of 07/2018  Substance and Sexual Activity  . Alcohol use: No    Alcohol/week: 0.0 standard drinks  . Drug use: No  . Sexual activity: Not on file  Lifestyle  . Physical activity:    Days per week: Not on file    Minutes per session: Not on file  . Stress: Not on file  Relationships  . Social connections:    Talks on phone: Not on file    Gets together: Not on file    Attends religious service: Not on file    Active member of club or organization: Not on file    Attends meetings of clubs or organizations: Not on file    Relationship status: Not on file  . Intimate partner violence:    Fear of current or ex partner: Not on file    Emotionally abused: Not on file    Physically abused: Not on file    Forced sexual activity: Not on file  Other Topics Concern  . Not on file  Social History Narrative   2 children--pt has custody. Husband pays child support.   Daily Caffeine Use-3 2 liters a day   Pt does not get regular exercise.     Current Outpatient Medications:  .  albuterol (PROVENTIL HFA;VENTOLIN HFA) 108 (90 Base) MCG/ACT inhaler, Inhale 2 puffs into the lungs  every 6 (six) hours as needed for wheezing or shortness of breath., Disp: 1 Inhaler, Rfl: 2 .  ALPRAZolam (XANAX) 0.25 MG tablet, Take 0.25 mg by mouth 3 (three) times daily as needed for anxiety., Disp: , Rfl:  .  atorvastatin (LIPITOR) 10 MG tablet, Take 1 tablet (10 mg total) by mouth at bedtime., Disp: 90 tablet, Rfl: 1 .  Blood Glucose Monitoring Suppl (ACCU-CHEK AVIVA PLUS) W/DEVICE KIT, 1 Device by Does not apply route once., Disp: 1 kit, Rfl: 0 .  budesonide-formoterol (SYMBICORT) 80-4.5 MCG/ACT inhaler, Inhale 2 puffs into the lungs  2 (two) times daily., Disp: 1 Inhaler, Rfl: 5 .  Cholecalciferol (VITAMIN D3) 50 MCG (2000 UT) capsule, Take 2,000 Units by mouth daily., Disp: , Rfl:  .  diazepam (VALIUM) 10 MG tablet, Take 1 tablet (10 mg total) by mouth every 6 (six) hours as needed for anxiety., Disp: 1 tablet, Rfl: 0 .  gabapentin (NEURONTIN) 300 MG capsule, Take 1 capsule (300 mg total) by mouth 3 (three) times daily. As needed, Disp: 270 capsule, Rfl: 1 .  glucagon 1 MG injection, Inject 1 mg into the muscle once as needed., Disp: 1 each, Rfl: 12 .  glucose blood (ACCU-CHEK AVIVA PLUS) test strip, USE TO TEST BLOOD SUGAR 7 TIMES DAILY, and lancets 7/day. DX E10.41, Disp: 250 each, Rfl: 11 .  HYDROcodone-acetaminophen (NORCO) 7.5-325 MG tablet, Take 1 tablet by mouth every 4 (four) hours as needed for moderate pain., Disp: , Rfl:  .  ibuprofen (ADVIL,MOTRIN) 200 MG tablet, Take 600-800 mg by mouth every 8 (eight) hours as needed (for pain.)., Disp: , Rfl:  .  Insulin Pen Needle 31G X 8 MM MISC, Use as directed , Disp: , Rfl:  .  meclizine (ANTIVERT) 25 MG tablet, Take 1 tablet (25 mg total) by mouth 3 (three) times daily as needed for dizziness., Disp: 30 tablet, Rfl: 0 .  meloxicam (MOBIC) 15 MG tablet, Take 1 tablet (15 mg total) by mouth daily., Disp: 30 tablet, Rfl: 0 .  metoprolol succinate (TOPROL-XL) 25 MG 24 hr tablet, Take 25 mg by mouth daily., Disp: , Rfl:  .  Multiple Vitamin  (MULTIVITAMIN WITH MINERALS) TABS tablet, Take 1 tablet by mouth daily., Disp: , Rfl:  .  NOVOLOG 100 UNIT/ML injection, INJECT 40 UNITS DAILY PER INSULIN PUMP (MAX DOSAGE), Disp: 10 mL, Rfl: 0 .  omeprazole (PRILOSEC) 40 MG capsule, Take 40 mg by mouth daily before breakfast., Disp: , Rfl: 2 .  PARoxetine (PAXIL) 40 MG tablet, Take 0.5 tablets (20 mg total) by mouth daily., Disp: 90 tablet, Rfl: 1 .  rOPINIRole (REQUIP) 1 MG tablet, Take 1 tablet (1 mg total) by mouth at bedtime., Disp: 90 tablet, Rfl: 1 .  triamcinolone cream (KENALOG) 0.1 %, Apply 1 application topically 2 (two) times daily., Disp: 30 g, Rfl: 0  EXAM:  VITALS per patient if applicable: Temp 40.9 F (36.6 C) (Axillary) Comment: pt reports  Wt 126 lb (57.2 kg) Comment: Pt reports  BMI 21.63 kg/m   GENERAL: alert, oriented, appears well and in no acute distress  PSYCH/NEURO: pleasant and cooperative initially however a little more insistent and aggressive regarding pain medication towards the end of the call.   ASSESSMENT AND PLAN:  Discussed the following assessment and plan:  Chronic pain syndrome -Multifactorial with neuropathy and arthropathy.  -I suspect the majority of this may be coming from her diabetes, once again encouraged to schedule f/u with endocrinology.  -I declined to provide opioid medication.  Discussed that if primary care sports medicine provider felt that this is something she needed to continue on she would need to have him continue to write this.  -She will have notes sent over form Dr. Delilah Shan as well.   -She will continue meloxicam, gabapentin and topical diclofenac.    Anxiety -Continue paxil -Declined to restart alprazolam at this time -Offered buspar or vistaril in place of this however she declines.     >25 minutes spent with patient with 50% of time spent providing counseling regarding management of pain and anxiety as outlined  above.     I discussed the assessment and treatment  plan with the patient. The patient was provided an opportunity to ask questions and all were answered. The patient agreed with the plan and demonstrated an understanding of the instructions.   The patient was advised to call back or seek an in-person evaluation if the symptoms worsen or if the condition fails to improve as anticipated.    Luetta Nutting, DO

## 2018-11-13 NOTE — Assessment & Plan Note (Signed)
>>  ASSESSMENT AND PLAN FOR ANXIETY WRITTEN ON 11/13/2018 10:45 PM BY MATTHEWS, CODY, DO  -Continue paxil  -Declined to restart alprazolam  at this time -Offered buspar  or vistaril  in place of this however she declines.

## 2018-11-13 NOTE — Assessment & Plan Note (Signed)
-  Continue paxil -Declined to restart alprazolam at this time -Offered buspar or vistaril in place of this however she declines.

## 2018-12-29 ENCOUNTER — Other Ambulatory Visit: Payer: Self-pay | Admitting: Internal Medicine

## 2019-01-14 ENCOUNTER — Encounter: Payer: 59 | Admitting: Endocrinology

## 2019-01-16 ENCOUNTER — Other Ambulatory Visit: Payer: Self-pay

## 2019-01-21 ENCOUNTER — Ambulatory Visit: Payer: 59 | Admitting: Endocrinology

## 2019-01-21 DIAGNOSIS — Z0289 Encounter for other administrative examinations: Secondary | ICD-10-CM

## 2019-02-06 ENCOUNTER — Telehealth: Payer: Self-pay

## 2019-02-06 NOTE — Telephone Encounter (Signed)
Dr. [name] has requested that this pt's nurse visit/A1C appt be changed to a visit with him. LOV 09/20/17 and her next OV was to be within 2 weeks of that appt. This message routed to [employer] for scheduling purposes.

## 2019-02-06 NOTE — Telephone Encounter (Signed)
LMTCB - to advise that Dr Arnoldo Lenis would like tosee her in person for a visit and A1C check at the same time.  This can be done on Friday 02/07/19 or Monday 02/10/2019.

## 2019-02-07 ENCOUNTER — Ambulatory Visit: Payer: 59

## 2019-02-07 ENCOUNTER — Telehealth: Payer: Self-pay | Admitting: Endocrinology

## 2019-02-07 ENCOUNTER — Other Ambulatory Visit (INDEPENDENT_AMBULATORY_CARE_PROVIDER_SITE_OTHER): Payer: 59

## 2019-02-07 ENCOUNTER — Telehealth: Payer: Self-pay

## 2019-02-07 ENCOUNTER — Other Ambulatory Visit: Payer: Self-pay

## 2019-02-07 DIAGNOSIS — E1065 Type 1 diabetes mellitus with hyperglycemia: Secondary | ICD-10-CM | POA: Diagnosis not present

## 2019-02-07 DIAGNOSIS — IMO0002 Reserved for concepts with insufficient information to code with codable children: Secondary | ICD-10-CM

## 2019-02-07 DIAGNOSIS — E1049 Type 1 diabetes mellitus with other diabetic neurological complication: Secondary | ICD-10-CM

## 2019-02-07 LAB — POCT GLYCOSYLATED HEMOGLOBIN (HGB A1C): Hemoglobin A1C: 7.7 % — AB (ref 4.0–5.6)

## 2019-02-07 NOTE — Telephone Encounter (Signed)
Patient dropped off DMV forms for Dr. [name] to fill out. Patient forgot to give forms at appointment. Forms have been placed in Dr. Cordelia Pen box

## 2019-02-07 NOTE — Telephone Encounter (Signed)
Patient currently has a OneTouch insulin pump. She stated that her warranty is now up and she is wanting to get a new pump and wants to speak with L.Spagnola,RN about a new pump to get.  Could you please give her a call?

## 2019-02-07 NOTE — Telephone Encounter (Signed)
Documents have been placed on Dr. Cordelia Pen desk for him to review and complete.

## 2019-02-10 ENCOUNTER — Other Ambulatory Visit: Payer: Self-pay

## 2019-02-10 ENCOUNTER — Ambulatory Visit (INDEPENDENT_AMBULATORY_CARE_PROVIDER_SITE_OTHER): Payer: 59 | Admitting: Nurse Practitioner

## 2019-02-10 ENCOUNTER — Ambulatory Visit (INDEPENDENT_AMBULATORY_CARE_PROVIDER_SITE_OTHER): Payer: 59 | Admitting: Endocrinology

## 2019-02-10 ENCOUNTER — Encounter: Payer: Self-pay | Admitting: Nurse Practitioner

## 2019-02-10 DIAGNOSIS — E1049 Type 1 diabetes mellitus with other diabetic neurological complication: Secondary | ICD-10-CM | POA: Diagnosis not present

## 2019-02-10 DIAGNOSIS — R059 Cough, unspecified: Secondary | ICD-10-CM

## 2019-02-10 DIAGNOSIS — R0683 Snoring: Secondary | ICD-10-CM | POA: Diagnosis not present

## 2019-02-10 DIAGNOSIS — R05 Cough: Secondary | ICD-10-CM

## 2019-02-10 DIAGNOSIS — IMO0002 Reserved for concepts with insufficient information to code with codable children: Secondary | ICD-10-CM

## 2019-02-10 DIAGNOSIS — E1065 Type 1 diabetes mellitus with hyperglycemia: Secondary | ICD-10-CM

## 2019-02-10 DIAGNOSIS — R0602 Shortness of breath: Secondary | ICD-10-CM

## 2019-02-10 DIAGNOSIS — J4520 Mild intermittent asthma, uncomplicated: Secondary | ICD-10-CM

## 2019-02-10 NOTE — Addendum Note (Signed)
Addended by: Nena Polio on: 02/10/2019 04:41 PM   Modules accepted: Orders

## 2019-02-10 NOTE — Telephone Encounter (Signed)
Message left that I have given pump information to the CGM to give to her today.  I am not here this afternoon.  Told her to call me to discuss advantages and disadvantage of each model. And what to do when she decides which one she wants.

## 2019-02-10 NOTE — Patient Instructions (Signed)
Continue symbicort 80/4.5 2 puffs twice daily Continue albuterol as needed Please quit smoking Will order chest x ray and call with results Will order HST   Follow up with Dr. [name] after sleep study in 4-6 weeks months or sooner if needed

## 2019-02-10 NOTE — Progress Notes (Signed)
Virtual Visit via Telephone Note  I connected with [name] on 02/10/19 at  4:15 PM EDT by telephone and verified that I am speaking with the correct person using two identifiers.  Location: Patient: home Provider: office   I discussed the limitations, risks, security and privacy concerns of performing an evaluation and management service by telephone and the availability of in person appointments. I also discussed with the patient that there may be a patient responsible charge related to this service. The patient expressed understanding and agreed to proceed.   History of Present Illness: 43 year old female active smoker with history of asthma who is followed by Dr. [name].  Patient has a tele-visit today for ongoing cough and shortness of breath.  Patient states that she notices this more in the mornings.  She states that her cough is productive of thick white sputum in the mornings.  She complains of throat irritation in the mornings as well.  Patient states that she does snore heavily at night.  She states that she does wake up gasping for breath at night.  Unfortunately, patient continues to smoke.  Patient does have known issues with anxiety.  She is on Xanax.  Patient has Symbicort and albuterol to take as needed.  Patient states that she has had a cardiac work-up that was overall normal.  Patient is currently on Prilosec for GERD.  Patient states that she has had work-up through ENT for throat irritation.  Findings were normal.  Denies f/c/s, n/v/d, hemoptysis, PND, leg swelling.                                                                  Observations/Objective: Imaging: CXR 11/15/219>>There is no acute pneumonia nor CHF. Mildly increased lung markings bilaterally are compatible with reactive airway disease and/or COPD in the patient's smoking history. CTA 05/10/18>>No thoracic aortic aneurysm or dissection. No demonstrable pulmonary embolus. No edema or consolidation. 2  mm nodular opacity in the left upper lobe anteriorly. There is no nodular opacity in the right upper lobe as well as question on recent chest radiograph. It is possible that the nodular opacity question on chest radiograph represents a bone island. No follow-up needed if patient is low-risk. Non-contrast chest CT can be considered in 12 months if patient is high-risk.  PFT: PFT Results Latest Ref Rng & Units 08/12/2018  FVC-Pre L 3.25  FVC-Predicted Pre % 88  FVC-Post L 3.28  FVC-Predicted Post % 89  Pre FEV1/FVC % % 70  Post FEV1/FCV % % 78  FEV1-Pre L 2.28  FEV1-Predicted Pre % 76  FEV1-Post L 2.55  DLCO UNC% % 82  DLCO COR %Predicted % 96  TLC L 5.23  TLC % Predicted % 103  RV % Predicted % 137     Assessment and Plan: Patient has a tele-visit today for ongoing cough and shortness of breath.  Patient states that she notices this more in the mornings.  She states that her cough is productive of thick white sputum in the mornings.  She complains of throat irritation in the mornings as well.  Patient states that she does snore heavily at night.  She states that she does wake up gasping for breath at night.  Unfortunately, patient continues to smoke.  Patient does have known issues with anxiety.  She is on Xanax.  Patient has Symbicort and albuterol to take as needed.  Patient states that she has had a cardiac work-up that was overall normal.  Patient is currently on Prilosec for GERD.  Patient states that she has had work-up through ENT for throat irritation.  Findings were normal.   Patient Instructions  Continue symbicort 80/4.5 2 puffs twice daily Continue albuterol as needed Please quit smoking Will order chest x ray and call with results Will order HST    Follow Up Instructions: Follow up with Dr. [name] after sleep study in 4-6 weeks months or sooner if needed  I discussed the assessment and treatment plan with the patient. The patient was provided an opportunity to ask  questions and all were answered. The patient agreed with the plan and demonstrated an understanding of the instructions.   The patient was advised to call back or seek an in-person evaluation if the symptoms worsen or if the condition fails to improve as anticipated.  I provided 22 minutes of non-face-to-face time during this encounter.   Fenton Foy, NP

## 2019-02-10 NOTE — Assessment & Plan Note (Signed)
Patient has a tele-visit today for ongoing cough and shortness of breath.  Patient states that she notices this more in the mornings.  She states that her cough is productive of thick white sputum in the mornings.  She complains of throat irritation in the mornings as well.  Patient states that she does snore heavily at night.  She states that she does wake up gasping for breath at night.  Unfortunately, patient continues to smoke.  Patient does have known issues with anxiety.  She is on Xanax.  Patient has Symbicort and albuterol to take as needed.  Patient states that she has had a cardiac work-up that was overall normal.  Patient is currently on Prilosec for GERD.  Patient states that she has had work-up through ENT for throat irritation.  Findings were normal.   Patient Instructions  Continue symbicort 80/4.5 2 puffs twice daily Continue albuterol as needed Please quit smoking Will order chest x ray and call with results Will order HST   Follow up with Dr. [name] after sleep study in 4-6 weeks months or sooner if needed

## 2019-02-10 NOTE — Patient Instructions (Addendum)
Please take these settings:  basal rate of 0.4 units/hr, 10 PM-6 AM, and 1 unit/hr, 6 AM-10 PM.  Suspend for 1-2 hrs for activity.   No mealtime bolus.   correction bolus (which some people call "sensitivity," or "insulin sensitivity ratio," or just "isr") of 1 unit for each 100 by which your glucose exceeds 100.   On this type of insulin schedule, you should eat meals on a regular schedule.  If a meal is missed or significantly delayed, your blood sugar could go low.  Please see [name], to help you make these changes.   We'll send the DMV form.  Please come back for a follow-up appointment in 3 months.

## 2019-02-10 NOTE — Progress Notes (Signed)
Subjective:    Patient ID: [name], female    DOB: 12/11/1975, 43 y.o.   MRN: [id number]  HPI telehealth visit today via phone x 11 minutes Alternatives to telehealth are presented to this patient, and the patient agrees to the telehealth visit. Pt is advised of the cost of the visit, and agrees to this, also.   Patient is at home, and I am at the office.   Persons attending the telehealth visit: the patient and I Pt returns for f/u of diabetes mellitus: DM type: 1 Dx'ed: 6213 Complications: polyneuropathy and retinopathy. Therapy: insulin since dx, pump rx since 2017 DKA: never Severe hypoglycemia: never Pancreatitis: never Other: therapy has been limited by chronic noncompliance; she has had TAH;  She declines continuous glucose monitor.   Interval history: she takes these pump settings: basal rate of 0.5 units/hr, 10 PM-6 AM, and 1 unit/hr, 6 AM-10 PM.  Suspend for 1-2 hrs for activity.   No mealtime bolus.   correction bolus (which some people call "sensitivity," or "insulin sensitivity ratio," or just "isr") of 1 unit for each 100 by which your glucose exceeds 100.   TDD is approx 25 units/day.   no cbg record, but pt states cbg's vary from 80-200's.  It is in general higher as the day goes on.   Past Medical History:  Diagnosis Date  . Anxiety state, unspecified 03/26/2009  . ASTHMA 02/02/2007  . DEPRESSION 02/16/2009  . DIABETES MELLITUS, TYPE I 02/02/2007  . Endometriosis   . GERD 12/21/2008  . Hyperlipidemia   . Lesion of vocal cord   . Shortness of breath 09/25/2007  . Vocal fold leukoplakia     Past Surgical History:  Procedure Laterality Date  . ABDOMINAL HYSTERECTOMY  2009   BSO  . DIRECT LARYNGOSCOPY N/A 08/17/2017   Procedure: DIRECT LARYNGOSCOPY WITH OPERATING TELESCOPE WITH BIOPSY;  Surgeon: Helayne Seminole, MD;  Location: [facility];  Service: ENT;  Laterality: N/A;  . NASOPHARYNGEAL BIOPSY Right 08/17/2017   Procedure: NASOPHARYNGEAL BIOPSY RIGHT  NASAL LESION;  Surgeon: Helayne Seminole, MD;  Location: [facility];  Service: ENT;  Laterality: Right;    Social History   Socioeconomic History  . Marital status: Legally Separated    Spouse name: Not on file  . Number of children: 2  . Years of education: Not on file  . Highest education level: Not on file  Occupational History    Employer: UNEMPLOYED  Social Needs  . Financial resource strain: Not on file  . Food insecurity    Worry: Not on file    Inability: Not on file  . Transportation needs    Medical: Not on file    Non-medical: Not on file  Tobacco Use  . Smoking status: Current Every Day Smoker    Packs/day: 1.50    Years: 25.00    Pack years: 37.50    Types: Cigarettes  . Smokeless tobacco: Never Used  . Tobacco comment: 1PPD as of 07/2018  Substance and Sexual Activity  . Alcohol use: No    Alcohol/week: 0.0 standard drinks  . Drug use: No  . Sexual activity: Not on file  Lifestyle  . Physical activity    Days per week: Not on file    Minutes per session: Not on file  . Stress: Not on file  Relationships  . Social Herbalist on phone: Not on file    Gets together: Not on file  Attends religious service: Not on file    Active member of club or organization: Not on file    Attends meetings of clubs or organizations: Not on file    Relationship status: Not on file  . Intimate partner violence    Fear of current or ex partner: Not on file    Emotionally abused: Not on file    Physically abused: Not on file    Forced sexual activity: Not on file  Other Topics Concern  . Not on file  Social History Narrative   2 children--pt has custody. Husband pays child support.   Daily Caffeine Use-3 2 liters a day   Pt does not get regular exercise.    Current Outpatient Medications on File Prior to Visit  Medication Sig Dispense Refill  . albuterol (PROVENTIL HFA;VENTOLIN HFA) 108 (90 Base) MCG/ACT inhaler Inhale 2 puffs into the lungs every 6  (six) hours as needed for wheezing or shortness of breath. 1 Inhaler 2  . ALPRAZolam (XANAX) 0.25 MG tablet Take 0.25 mg by mouth 3 (three) times daily as needed for anxiety.    Marland Kitchen atorvastatin (LIPITOR) 10 MG tablet Take 1 tablet (10 mg total) by mouth at bedtime. 90 tablet 1  . Blood Glucose Monitoring Suppl (ACCU-CHEK AVIVA PLUS) W/DEVICE KIT 1 Device by Does not apply route once. 1 kit 0  . budesonide-formoterol (SYMBICORT) 80-4.5 MCG/ACT inhaler Inhale 2 puffs into the lungs 2 (two) times daily. 1 Inhaler 5  . Cholecalciferol (VITAMIN D3) 50 MCG (2000 UT) capsule Take 2,000 Units by mouth daily.    . diazepam (VALIUM) 10 MG tablet Take 1 tablet (10 mg total) by mouth every 6 (six) hours as needed for anxiety. 1 tablet 0  . gabapentin (NEURONTIN) 300 MG capsule Take 1 capsule (300 mg total) by mouth 3 (three) times daily. As needed 270 capsule 1  . glucagon 1 MG injection Inject 1 mg into the muscle once as needed. 1 each 12  . glucose blood (ACCU-CHEK AVIVA PLUS) test strip USE TO TEST BLOOD SUGAR 7 TIMES DAILY, and lancets 7/day. DX E10.41 250 each 11  . HYDROcodone-acetaminophen (NORCO) 7.5-325 MG tablet Take 1 tablet by mouth every 4 (four) hours as needed for moderate pain.    Marland Kitchen ibuprofen (ADVIL,MOTRIN) 200 MG tablet Take 600-800 mg by mouth every 8 (eight) hours as needed (for pain.).    . Insulin Pen Needle 31G X 8 MM MISC Use as directed     . meclizine (ANTIVERT) 25 MG tablet Take 1 tablet (25 mg total) by mouth 3 (three) times daily as needed for dizziness. 30 tablet 0  . meloxicam (MOBIC) 15 MG tablet Take 1 tablet (15 mg total) by mouth daily. 30 tablet 0  . metoprolol succinate (TOPROL-XL) 25 MG 24 hr tablet Take 25 mg by mouth daily.    . Multiple Vitamin (MULTIVITAMIN WITH MINERALS) TABS tablet Take 1 tablet by mouth daily.    Marland Kitchen NOVOLOG 100 UNIT/ML injection INJECT 40 UNITS DAILY PER INSULIN PUMP (MAX DOSAGE) 10 mL 0  . omeprazole (PRILOSEC) 40 MG capsule Take 40 mg by mouth daily  before breakfast.  2  . PARoxetine (PAXIL) 40 MG tablet Take 0.5 tablets (20 mg total) by mouth daily. 90 tablet 1  . rOPINIRole (REQUIP) 1 MG tablet Take 1 tablet (1 mg total) by mouth at bedtime. 90 tablet 1  . triamcinolone cream (KENALOG) 0.1 % Apply 1 application topically 2 (two) times daily. 30 g 0  No current facility-administered medications on file prior to visit.     No Known Allergies  Family History  Problem Relation Age of Onset  . Diabetes Mother   . Diabetes Maternal Aunt   . Emphysema Maternal Aunt   . Emphysema Maternal Uncle   . Diabetes Maternal Grandfather   . Lung cancer Maternal Aunt   . Colon cancer Neg Hx   . Kidney disease Neg Hx   . Liver disease Neg Hx     There were no vitals taken for this visit.  Review of Systems She denies hypoglycemia    Objective:   Physical Exam   Lab Results  Component Value Date   HGBA1C 7.7 (A) 02/07/2019       Assessment & Plan:  Type 1 DM, with PN: Based on the pattern of her cbg's, she needs some adjustment in her therapy   Patient Instructions  Please take these settings:  basal rate of 0.4 units/hr, 10 PM-6 AM, and 1 unit/hr, 6 AM-10 PM.  Suspend for 1-2 hrs for activity.   No mealtime bolus.   correction bolus (which some people call "sensitivity," or "insulin sensitivity ratio," or just "isr") of 1 unit for each 100 by which your glucose exceeds 100.   On this type of insulin schedule, you should eat meals on a regular schedule.  If a meal is missed or significantly delayed, your blood sugar could go low.  Please see Vaughan Basta, to help you make these changes.   We'll send the DMV form.  Please come back for a follow-up appointment in 3 months.

## 2019-02-11 ENCOUNTER — Telehealth: Payer: Self-pay

## 2019-02-11 NOTE — Progress Notes (Signed)
Changed to nurse visit A1C

## 2019-02-11 NOTE — Telephone Encounter (Signed)
Pt requested a call once [place] DMV Forms were completed by Dr. [name]. At her request, I called pt and left detailed message informing her the ENDOCRINOLOGY portion of [place] DMV forms have been completed and are available for her to pick up at her convenience. Forms have been placed at the front desk for ease of pick up. A copy of these forms have also been retained, faxed to [facility] DMV, labeled and sent to HIM for scanning purposes and our future reference.

## 2019-02-12 ENCOUNTER — Ambulatory Visit (INDEPENDENT_AMBULATORY_CARE_PROVIDER_SITE_OTHER): Payer: 59

## 2019-02-12 DIAGNOSIS — R059 Cough, unspecified: Secondary | ICD-10-CM

## 2019-02-12 DIAGNOSIS — R0602 Shortness of breath: Secondary | ICD-10-CM

## 2019-02-12 DIAGNOSIS — R05 Cough: Secondary | ICD-10-CM

## 2019-02-13 MED ORDER — PREDNISONE 10 MG PO TABS: ORAL_TABLET | ORAL | 0 refills | Status: DC

## 2019-02-13 MED ORDER — AZITHROMYCIN 250 MG PO TABS: ORAL_TABLET | ORAL | 0 refills | Status: DC

## 2019-02-13 NOTE — Progress Notes (Signed)
Please call to let patient know that her chest x ray showed exacerbation. Will order prednisone taper and azithromycin.

## 2019-02-13 NOTE — Progress Notes (Unsigned)
Reviewed NP telephone encounter and CXR which demonstrated increased pulmonary interstitium. Based on patient's reported symptoms, would recommend treating for COPD exacerbation with prednisone 40 mg x 5 days and zpack.

## 2019-02-17 NOTE — Progress Notes (Signed)
Erroneous encounter

## 2019-03-04 ENCOUNTER — Telehealth: Payer: Self-pay | Admitting: Nurse Practitioner

## 2019-03-04 NOTE — Telephone Encounter (Signed)
Christian Mate, I'm sending this your way...looks like it was one you signed out earlier this month.

## 2019-03-04 NOTE — Telephone Encounter (Signed)
[name] will be coming in tomorrow afternoon 8/19 @ 3:00pm to pick home sleep machine and she is aware

## 2019-03-05 ENCOUNTER — Ambulatory Visit: Payer: 59

## 2019-03-05 ENCOUNTER — Other Ambulatory Visit: Payer: Self-pay

## 2019-03-05 DIAGNOSIS — R0683 Snoring: Secondary | ICD-10-CM

## 2019-03-06 DIAGNOSIS — G4733 Obstructive sleep apnea (adult) (pediatric): Secondary | ICD-10-CM | POA: Diagnosis not present

## 2019-03-07 DIAGNOSIS — G4733 Obstructive sleep apnea (adult) (pediatric): Secondary | ICD-10-CM | POA: Diagnosis not present

## 2019-03-13 ENCOUNTER — Telehealth: Payer: Self-pay | Admitting: Pulmonary Disease

## 2019-03-13 NOTE — Telephone Encounter (Signed)
Phone note entered in error 

## 2019-03-17 ENCOUNTER — Telehealth: Payer: Self-pay | Admitting: Pulmonary Disease

## 2019-03-17 DIAGNOSIS — G4730 Sleep apnea, unspecified: Secondary | ICD-10-CM

## 2019-03-17 NOTE — Telephone Encounter (Signed)
lmtcb for pt. I do not see HST results in pt's chart. VS please advise on HST results if you have them.  Thanks!

## 2019-03-17 NOTE — Telephone Encounter (Signed)
Contacted patient regarding home sleep study results. AHI 6.7. We discussed oral appliance vs CPAP therapy. After discussion, patient preferred to try CPAP.  Will scheduled follow-up with me in 4 months.  JE

## 2019-03-17 NOTE — Telephone Encounter (Signed)
ATC patient, unable to reach to make appointment.  Order placed for CPAP therapy   Will need appt with JE in 4 months when she calls back

## 2019-03-17 NOTE — Telephone Encounter (Signed)
Patient of Dr. [name].  Last seen by Lazaro Arms on 02/10/19.  Sleep study reviewed by me on 03/07/19 and scanned into epic on 03/14/19.  Shows mild sleep apnea with AHI 6.7, SpO2 low 81%.  Will route to Dr. [name] to follow up with pt.

## 2019-03-17 NOTE — Telephone Encounter (Signed)
Dr. [name] called and spoke with patient.

## 2019-03-26 ENCOUNTER — Other Ambulatory Visit: Payer: Self-pay

## 2019-03-26 MED ORDER — ROPINIROLE HCL 1 MG PO TABS: 1.0000 mg | ORAL_TABLET | Freq: Every day | ORAL | 0 refills | Status: DC

## 2019-05-29 ENCOUNTER — Other Ambulatory Visit: Payer: Self-pay

## 2019-05-30 ENCOUNTER — Encounter: Payer: Self-pay | Admitting: Obstetrics & Gynecology

## 2019-05-30 ENCOUNTER — Ambulatory Visit (INDEPENDENT_AMBULATORY_CARE_PROVIDER_SITE_OTHER): Payer: 59 | Admitting: Obstetrics & Gynecology

## 2019-05-30 VITALS — BP 130/82 | Ht 65.75 in | Wt 122.0 lb

## 2019-05-30 DIAGNOSIS — Z01419 Encounter for gynecological examination (general) (routine) without abnormal findings: Secondary | ICD-10-CM

## 2019-05-30 DIAGNOSIS — F17210 Nicotine dependence, cigarettes, uncomplicated: Secondary | ICD-10-CM

## 2019-05-30 DIAGNOSIS — Z90710 Acquired absence of both cervix and uterus: Secondary | ICD-10-CM

## 2019-05-30 DIAGNOSIS — Z113 Encounter for screening for infections with a predominantly sexual mode of transmission: Secondary | ICD-10-CM

## 2019-05-30 DIAGNOSIS — Z1151 Encounter for screening for human papillomavirus (HPV): Secondary | ICD-10-CM

## 2019-05-30 DIAGNOSIS — Z1272 Encounter for screening for malignant neoplasm of vagina: Secondary | ICD-10-CM

## 2019-05-30 DIAGNOSIS — K644 Residual hemorrhoidal skin tags: Secondary | ICD-10-CM

## 2019-05-30 DIAGNOSIS — A630 Anogenital (venereal) warts: Secondary | ICD-10-CM
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
## 2019-05-30 NOTE — Patient Instructions (Signed)
1. Encounter for Papanicolaou smear of vagina as part of routine gynecological examination Gynecologic exam status post LAVH with vulvar condylomas and skin tags.  Pap reflex done at the vaginal vault.  Breast exam normal.  Will schedule a screening mammogram now.  Health labs with family physician.  Followed for diabetes mellitus type 2 and hypercholesterolemia.  Good body mass index at 19.84.  Encouraged to exercise regularly and have a healthy diet with vegetables.  2. S/P laparoscopic assisted vaginal hysterectomy (LAVH)  3. 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  4. Screen for STD (sexually transmitted disease) Strongly recommend strict condom use. - Gono-Chlam on Pap - HIV antibody (with reflex) - RPR - Hepatitis C Antibody - Hepatitis B Surface AntiGEN  5. Skin tag of anus We will follow-up for removal of skin tag.  6. Cigarette smoker Strongly recommend to quit smoking.  Other orders - zinc gluconate 50 MG tablet; Take 50 mg by mouth daily.  [name], it was a pleasure meeting you today!  I will inform you of your results as soon as they are available.

## 2019-06-02 LAB — HIV ANTIBODY (ROUTINE TESTING W REFLEX): HIV 1&2 Ab, 4th Generation: NONREACTIVE

## 2019-06-02 LAB — HEPATITIS B SURFACE ANTIGEN: Hepatitis B Surface Ag: NONREACTIVE

## 2019-06-02 LAB — HEPATITIS C ANTIBODY
Hepatitis C Ab: NONREACTIVE
SIGNAL TO CUT-OFF: 0.01 (ref <1.00)

## 2019-06-02 LAB — RPR: RPR Ser Ql: NONREACTIVE

## 2019-06-03 NOTE — Progress Notes (Signed)
Virtual Visit via Telephone Note   This visit type was conducted due to national recommendations for restrictions regarding the COVID-19 Pandemic (e.g. social distancing) in an effort to limit this patient's exposure and mitigate transmission in our community.  Due to her co-morbid illnesses, this patient is at least at moderate risk for complications without adequate follow up.  This format is felt to be most appropriate for this patient at this time.  The patient did not have access to video technology/had technical difficulties with video requiring transitioning to audio format only (telephone).  All issues noted in this document were discussed and addressed.  No physical exam could be performed with this format.  Please refer to the patient's chart for her  consent to telehealth for [facility].  Evaluation Performed:  Follow-up visit  This visit type was conducted due to national recommendations for restrictions regarding the COVID-19 Pandemic (e.g. social distancing).  This format is felt to be most appropriate for this patient at this time.  All issues noted in this document were discussed and addressed.  No physical exam was performed (except for noted visual exam findings with Video Visits).  Please refer to the patient's chart (MyChart message for video visits and phone note for telephone visits) for the patient's consent to telehealth for [facility].  Date:  06/05/2019   ID:  [name], DOB Jul 28, 1975, MRN [id number]  Patient Location:  In her car  Provider location:   Off-site  PCP:  Tamsen Roers, MD  Cardiologist:  Elouise Munroe, MD 05/22/2018 Hosp Consult Electrophysiologist:  None   Chief Complaint:  Chest pain  History of Present Illness:    [name] is a 43 y.o. female who presents via audio/video conferencing for a telehealth visit today.    43 y.o. 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 She has been on   Every morning when she wakes, she has chest pain. It hurts so much she can't breathe well. The symptoms ease off gradually, last about an hour. Has this every day. She will cough, the cough is productive. She has gray sputum. This has been going on for months. It was going on when she did the stress test last year.   The chest pain is tightness and pressure. It reaches a 6/10. It is hard to take a deep breath, when she tries to do so, it makes her cough hard and she loses her breath.   She quit smoking for 3 weeks, it did not change her symptoms.   When she exerts herself, she will get SOB. It does not take much to make her SOB. With housework such as sweeping or vacuuming, she will feel it.   When she gets DOE, she will also feel the chest tightness, but it is not as bad as it is in the mornings.  She admits that she needs to quit smoking, but not ready to try yet.   The patient does not have symptoms concerning for COVID-19 infection (fever, chills, cough, or new shortness of breath).    Prior CV studies:   The following studies were reviewed today:  GXT MYOVIEW: 05/27/2018  Nuclear stress EF: 70%.  Blood pressure demonstrated a  normal response to exercise.  No T wave inversion was noted during stress.  There was no ST segment deviation noted during stress.  This is a low risk study.   Normal perfusion. LVEF 70% with normal wall motion. This is a low risk study.   Past Medical History:  Diagnosis Date  . Anxiety state, unspecified 03/26/2009  . ASTHMA 02/02/2007  . DEPRESSION 02/16/2009  . DIABETES MELLITUS, TYPE I 02/02/2007  . Endometriosis   . GERD 12/21/2008  . Herpes    at 18  . Hyperlipidemia   . Lesion of vocal cord   . Shortness of breath  09/25/2007  . Vocal fold leukoplakia    Past Surgical History:  Procedure Laterality Date  . ABDOMINAL HYSTERECTOMY  2009   BSO  . DIRECT LARYNGOSCOPY N/A 08/17/2017   Procedure: DIRECT LARYNGOSCOPY WITH OPERATING TELESCOPE WITH BIOPSY;  Surgeon: Helayne Seminole, MD;  Location: [facility];  Service: ENT;  Laterality: N/A;  . NASOPHARYNGEAL BIOPSY Right 08/17/2017   Procedure: NASOPHARYNGEAL BIOPSY RIGHT NASAL LESION;  Surgeon: Helayne Seminole, MD;  Location: [facility];  Service: ENT;  Laterality: Right;     Current Meds  Medication Sig  . albuterol (PROVENTIL HFA;VENTOLIN HFA) 108 (90 Base) MCG/ACT inhaler Inhale 2 puffs into the lungs every 6 (six) hours as needed for wheezing or shortness of breath.  . ALPRAZolam (XANAX) 0.25 MG tablet Take 0.25 mg by mouth 3 (three) times daily as needed for anxiety.  Marland Kitchen atorvastatin (LIPITOR) 10 MG tablet Take 1 tablet (10 mg total) by mouth at bedtime.  . Blood Glucose Monitoring Suppl (ACCU-CHEK AVIVA PLUS) W/DEVICE KIT 1 Device by Does not apply route once.  . budesonide-formoterol (SYMBICORT) 80-4.5 MCG/ACT inhaler Inhale 2 puffs into the lungs 2 (two) times daily.  . Cholecalciferol (VITAMIN D3) 50 MCG (2000 UT) capsule Take 1,000 Units by mouth daily.   Marland Kitchen gabapentin (NEURONTIN) 300 MG capsule Take 1 capsule (300 mg total) by mouth 3 (three) times daily. As needed  . glucagon 1 MG injection Inject 1 mg into the muscle once as needed.  Marland Kitchen glucose blood (ACCU-CHEK AVIVA PLUS) test strip USE TO TEST BLOOD SUGAR 7 TIMES DAILY, and lancets 7/day. DX E10.41  . HYDROcodone-acetaminophen (NORCO) 7.5-325 MG tablet Take 1 tablet by mouth every 4 (four) hours as needed for moderate pain.  . Insulin Pen Needle 31G X 8 MM MISC Use as directed   . meclizine (ANTIVERT) 25 MG tablet Take 1 tablet (25 mg total) by mouth 3 (three) times daily as needed for dizziness.  . Multiple Vitamin (MULTIVITAMIN WITH MINERALS) TABS tablet Take 1 tablet by mouth daily.  Marland Kitchen NOVOLOG  100 UNIT/ML injection INJECT 40 UNITS DAILY PER INSULIN PUMP (MAX DOSAGE)  . omeprazole (PRILOSEC) 40 MG capsule Take 40 mg by mouth daily before breakfast.  . PARoxetine (PAXIL) 40 MG tablet Take 0.5 tablets (20 mg total) by mouth daily.  Marland Kitchen rOPINIRole (REQUIP) 1 MG tablet Take 1 tablet (1 mg total) by mouth at bedtime.  Marland Kitchen zinc gluconate 50 MG tablet Take 50 mg by mouth daily.     Allergies:   Patient has no known allergies.   Social History   Tobacco Use  . Smoking status: Current Every Day Smoker    Packs/day: 1.50    Years: 25.00    Pack years: 37.50    Types: Cigarettes  . Smokeless tobacco: Never Used  . Tobacco comment: 1PPD as of 07/2018  Substance Use Topics  .  Alcohol use: No    Alcohol/week: 0.0 standard drinks  . Drug use: No     Family Hx: The patient's family history includes Diabetes in her maternal aunt, maternal grandfather, and mother; Emphysema in her maternal aunt and maternal uncle; Lung cancer in her maternal aunt. There is no history of Colon cancer, Kidney disease, or Liver disease.  ROS:   Please see the history of present illness.    All other systems reviewed and are negative.   Labs/Other Tests and Data Reviewed:    Recent Labs: 09/18/2018: BUN 7; Creatinine, Ser 0.65; Hemoglobin 14.9; Platelets 202.0; Potassium 4.1; Sodium 134   CBC    Component Value Date/Time   WBC 5.9 09/18/2018 1123   RBC 4.75 09/18/2018 1123   HGB 14.9 09/18/2018 1123   HGB 13.9 02/08/2014 2221   HCT 43.4 09/18/2018 1123   HCT 41.0 02/08/2014 2221   PLT 202.0 09/18/2018 1123   PLT 181 02/08/2014 2221   MCV 91.3 09/18/2018 1123   MCV 92 02/08/2014 2221   MCH 31.2 05/31/2018 1225   MCHC 34.2 09/18/2018 1123   RDW 12.9 09/18/2018 1123   RDW 13.2 02/08/2014 2221   LYMPHSABS 1.3 09/18/2018 1123   LYMPHSABS 2.0 02/08/2014 2221   MONOABS 0.4 09/18/2018 1123   MONOABS 0.8 02/08/2014 2221   EOSABS 0.0 09/18/2018 1123   EOSABS 0.1 02/08/2014 2221   BASOSABS 0.0  09/18/2018 1123   BASOSABS 0.1 02/08/2014 2221    CMP Latest Ref Rng & Units 09/18/2018 05/31/2018 05/10/2018  Glucose 70 - 99 mg/dL 225(H) 184(H) 204(H)  BUN 6 - 23 mg/dL '7 7 7  ' Creatinine 0.40 - 1.20 mg/dL 0.65 0.55 0.74  Sodium 135 - 145 mEq/L 134(L) 136 134(L)  Potassium 3.5 - 5.1 mEq/L 4.1 4.3 4.4  Chloride 96 - 112 mEq/L 100 101 101  CO2 19 - 32 mEq/L '28 26 25  ' Calcium 8.4 - 10.5 mg/dL 9.4 9.1 9.3  Total Protein 6.0 - 8.5 g/dL - - -  Total Bilirubin 0.2 - 1.2 mg/dL - - -  Alkaline Phos 39 - 117 U/L - - -  AST 0 - 37 U/L - - -  ALT 0 - 35 U/L - - -     Recent Lipid Panel Lab Results  Component Value Date/Time   CHOL 220 (H) 05/12/2010 11:52 AM   TRIG 200.0 (H) 05/12/2010 11:52 AM   HDL 49.90 05/12/2010 11:52 AM   CHOLHDL 4 05/12/2010 11:52 AM   LDLDIRECT 142.7 05/12/2010 11:52 AM    Wt Readings from Last 3 Encounters:  06/05/19 117 lb (53.1 kg)  05/30/19 122 lb (55.3 kg)  11/11/18 126 lb (57.2 kg)     Objective:    Vital Signs:  Ht 5' 5.75" (1.67 m)   Wt 117 lb (53.1 kg)   BMI 19.03 kg/m    43 y.o. female in no acute distress on the phone but has a raspy, wet cough   ASSESSMENT & PLAN:    1.  Chest pain: I explained why her symptoms were more consistent with a respiratory problem, then a cardiac problem. -However, she is still anxious about the symptoms which are occurring daily. -Coronary calcifications are not mentioned on her CT from 05/10/2018, but there was a 2 mm nodular opacity in the left upper lobe anteriorly, noncontrast CT recommended in 12 months as she is high risk>> she is past due for this -Discuss with Dr. Margaretann Loveless if we should get a cardiac CT.  This  would reassess the nodule, but would also give definitive information on her coronary anatomy  2.  Abnormal chest CT -Chest CT from 05/10/2018 showed a 2 mm left upper lobe nodule, repeat in 1 year -Make sure this is done  3.  Ongoing tobacco use -Patient is aware that her lung issues are due  to her smoking, but she is not ready to quit  4.  OSA on CPAP -She is trying to use it, but says the mask is uncomfortable -She is encouraged to follow-up with pulmonary for this and other respiratory issues  COVID-19 Education: The signs and symptoms of COVID-19 were discussed with the patient and how to seek care for testing (follow up with PCP or arrange E-visit).  The importance of social distancing was discussed today.  Patient Risk:   After full review of this patient's clinical status, I feel that they are at least moderate risk at this time.  Time:   Today, I have spent 18 minutes with the patient with telehealth technology discussing cardiac and respiratory issues.     Medication Adjustments/Labs and Tests Ordered: Current medicines are reviewed at length with the patient today.  Concerns regarding medicines are outlined above.  Tests Ordered: No orders of the defined types were placed in this encounter.  Medication Changes: No orders of the defined types were placed in this encounter.   Disposition:  Follow up with Elouise Munroe, MD   Signed, Rosaria Ferries, PA-C  06/05/2019 10:31 AM    [place]

## 2019-06-05 ENCOUNTER — Encounter: Payer: Self-pay | Admitting: Physician Assistant

## 2019-06-05 ENCOUNTER — Telehealth (INDEPENDENT_AMBULATORY_CARE_PROVIDER_SITE_OTHER): Payer: 59 | Admitting: Physician Assistant

## 2019-06-05 VITALS — Ht 65.75 in | Wt 117.0 lb

## 2019-06-05 DIAGNOSIS — R9389 Abnormal findings on diagnostic imaging of other specified body structures: Secondary | ICD-10-CM

## 2019-06-05 DIAGNOSIS — R079 Chest pain, unspecified: Secondary | ICD-10-CM

## 2019-06-05 DIAGNOSIS — Z9989 Dependence on other enabling machines and devices: Secondary | ICD-10-CM

## 2019-06-05 DIAGNOSIS — G4733 Obstructive sleep apnea (adult) (pediatric): Secondary | ICD-10-CM | POA: Diagnosis not present

## 2019-06-05 DIAGNOSIS — Z720 Tobacco use: Secondary | ICD-10-CM | POA: Diagnosis not present

## 2019-06-05 NOTE — Patient Instructions (Signed)
Medication Instructions:  Your physician recommends that you continue on your current medications as directed. Please refer to the Current Medication list given to you today.  *If you need a refill on your cardiac medications before your next appointment, please call your pharmacy*   Testing/Procedures: We will call you to discuss testing after speaking with Dr. Margaretann Loveless. Due to the holiday, we may not be able to schedule test until the week before or after.  Follow-Up: At [facility], you and your health needs are our priority.  As part of our continuing mission to provide you with exceptional heart care, we have created designated Provider Care Teams.  These Care Teams include your primary Cardiologist (physician) and Advanced Practice Providers (APPs -  Physician Assistants and Nurse Practitioners) who all work together to provide you with the care you need, when you need it.  Your next appointment:   6 month(s)  The format for your next appointment:   Either In Person or Virtual  Provider:   Cherlynn Kaiser, MD

## 2019-06-11 LAB — HPV TYPE 16 AND 18/45 RNA
HPV Type 16 RNA: NOT DETECTED
HPV Type 18/45 RNA: NOT DETECTED

## 2019-06-11 LAB — PAP IG, CT-NG NAA, HPV HIGH-RISK
C. trachomatis RNA, TMA: NOT DETECTED
HPV DNA High Risk: DETECTED — AB
N. gonorrhoeae RNA, TMA: NOT DETECTED

## 2019-06-16 ENCOUNTER — Telehealth: Payer: Self-pay | Admitting: Nutrition

## 2019-06-16 NOTE — Telephone Encounter (Signed)
Message left on my machine that she wants an appointment for a new pump.  She was called back and an appointment was made for Wednesday

## 2019-06-17 ENCOUNTER — Other Ambulatory Visit: Payer: Self-pay | Admitting: Family Medicine

## 2019-06-18 ENCOUNTER — Encounter: Payer: 59 | Attending: Endocrinology | Admitting: Nutrition

## 2019-06-18 ENCOUNTER — Other Ambulatory Visit: Payer: Self-pay

## 2019-06-18 DIAGNOSIS — E1065 Type 1 diabetes mellitus with hyperglycemia: Secondary | ICD-10-CM | POA: Insufficient documentation

## 2019-06-18 DIAGNOSIS — IMO0002 Reserved for concepts with insufficient information to code with codable children: Secondary | ICD-10-CM

## 2019-06-18 DIAGNOSIS — E1049 Type 1 diabetes mellitus with other diabetic neurological complication: Secondary | ICD-10-CM | POA: Insufficient documentation

## 2019-06-19 NOTE — Progress Notes (Signed)
[name] is using [employer] pump.  The screen is barely visible.  She wanted to see the available pumps.  She was shown all three pumps.  She chose the [employer] pump.  Paperwork was filled out and faxed to [employer].  She had no final questions.

## 2019-06-20 NOTE — Patient Instructions (Signed)
When pump comes, call office for training.

## 2019-06-23 ENCOUNTER — Other Ambulatory Visit: Payer: Self-pay | Admitting: Internal Medicine

## 2019-06-25 ENCOUNTER — Telehealth: Payer: Self-pay | Admitting: *Deleted

## 2019-06-25 NOTE — Telephone Encounter (Signed)
Patient called requesting yeast infection white discharge and itching. Patient is type 1 diabetic has recurrent yeast, has used OTC monistat and no relief. Patient asked if diflucan tablet could be sent?

## 2019-06-27 MED ORDER — FLUCONAZOLE 150 MG PO TABS: 150.0000 mg | ORAL_TABLET | Freq: Every day | ORAL | 3 refills | Status: DC

## 2019-06-27 NOTE — Telephone Encounter (Signed)
Patient informed, Rx sent.  

## 2019-06-27 NOTE — Telephone Encounter (Signed)
Yes, can send Fluconazole 150 mg 1 tab PO daily x 3.  #3, refill x 3.

## 2019-07-01 ENCOUNTER — Other Ambulatory Visit: Payer: Self-pay | Admitting: Physician Assistant

## 2019-07-01 DIAGNOSIS — R911 Solitary pulmonary nodule: Secondary | ICD-10-CM

## 2019-07-02 ENCOUNTER — Telehealth: Payer: Self-pay | Admitting: *Deleted

## 2019-07-02 NOTE — Telephone Encounter (Signed)
Left message for patient to call and schedule chest ct without contrast

## 2019-07-03 NOTE — Telephone Encounter (Signed)
Left message for patient to call and schedule chest ct

## 2019-07-14 ENCOUNTER — Other Ambulatory Visit: Payer: Self-pay | Admitting: Internal Medicine

## 2019-07-16 ENCOUNTER — Other Ambulatory Visit: Payer: Self-pay

## 2019-07-17 ENCOUNTER — Other Ambulatory Visit: Payer: Self-pay | Admitting: Obstetrics & Gynecology

## 2019-07-17 ENCOUNTER — Encounter: Payer: Self-pay | Admitting: Obstetrics & Gynecology

## 2019-07-17 ENCOUNTER — Ambulatory Visit (INDEPENDENT_AMBULATORY_CARE_PROVIDER_SITE_OTHER): Payer: 59 | Admitting: Obstetrics & Gynecology

## 2019-07-17 VITALS — BP 180/100

## 2019-07-17 DIAGNOSIS — A630 Anogenital (venereal) warts: Secondary | ICD-10-CM

## 2019-07-17 DIAGNOSIS — K644 Residual hemorrhoidal skin tags: Secondary | ICD-10-CM | POA: Diagnosis not present

## 2019-07-17 DIAGNOSIS — A6004 Herpesviral vulvovaginitis: Secondary | ICD-10-CM

## 2019-07-17 NOTE — Addendum Note (Signed)
Addended by: Lorine Bears on: 07/17/2019 03:22 PM   Modules accepted: Orders

## 2019-07-17 NOTE — Progress Notes (Signed)
    [name] 30-Jan-1976 [id number]        43 y.o.  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  H/O Genital HSV.   OB History  Gravida Para Term Preterm AB Living  2 2     0 2  SAB TAB Ectopic Multiple Live Births      0        # Outcome Date GA Lbr Len/2nd Weight Sex Delivery Anes PTL Lv  2 Para           1 Para             Past medical history,surgical history, problem list, medications, allergies, family history and social history were all reviewed and documented in the EPIC chart.   Directed ROS with pertinent positives and negatives documented in the history of present illness/assessment and plan.  Exam:  Vitals:   07/17/19 1435  BP: (!) 170/94   General appearance:  Normal   Physical Exam Genitourinary:     Excision of skin tags x 2 (red):  Betadine prep.  Local anesthesia with Lidocaine 1%.  Excisions x2 with the scalpel.  Specimens sent to Patho.  Closure with separate stitches of Vicryl 4-0.  Good hemostasis.  No Cx.    Assessment/Plan:  43 y.o. G2P0002   1. Skin tag of anus and vulva Excision of 2 skin tags.  No Cx.  Well tolerated.  Pending pathology.  Post procedure precautions reviewed.  2. 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  3. Herpes simplex vulvovaginitis Patient informed of the recurrence.  Patient will call back for treatment with valacyclovir if has other recurrences.  Counseling on the above issues and coordination of care more than 50% for 10 minutes.  Princess Bruins MD, 2:39 PM 07/17/2019

## 2019-07-17 NOTE — Patient Instructions (Signed)
1. Skin tag of anus and vulva Excision of 2 skin tags.  No Cx.  Well tolerated.  Pending pathology.  Post procedure precautions reviewed.  2. 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  3. Herpes simplex vulvovaginitis Patient informed of the recurrence.  Patient will call back for treatment with valacyclovir if has other recurrences.  Jessieca, it was a pleasure seeing you today!

## 2019-07-21 ENCOUNTER — Telehealth: Payer: Self-pay | Admitting: Internal Medicine

## 2019-07-21 NOTE — Telephone Encounter (Signed)
Chest CT is non-contrast - should not need labs. Left message for patient with this info

## 2019-07-21 NOTE — Telephone Encounter (Signed)
New Message      Pt is calling and says she is suppose to have labs before her CT      Please call

## 2019-07-22 ENCOUNTER — Ambulatory Visit (HOSPITAL_COMMUNITY): Payer: 59

## 2019-07-24 ENCOUNTER — Telehealth: Payer: 59 | Admitting: Internal Medicine

## 2019-07-24 ENCOUNTER — Ambulatory Visit: Payer: 59 | Admitting: Obstetrics & Gynecology

## 2019-07-25 ENCOUNTER — Telehealth: Payer: Self-pay | Admitting: Dietician

## 2019-07-28 NOTE — Telephone Encounter (Signed)
Spoke with patient and informed her of benign pathology result.

## 2019-07-30 NOTE — Telephone Encounter (Signed)
Message left on machine that she will need to call the [employer] representative to get that information.  She was given his name and number

## 2019-07-31 ENCOUNTER — Ambulatory Visit (HOSPITAL_COMMUNITY)
Admission: RE | Admit: 2019-07-31 | Discharge: 2019-07-31 | Disposition: A | Discharge status: Home / Self Care | Payer: 59 | Source: Ambulatory Visit | Attending: Physician Assistant | Admitting: Physician Assistant

## 2019-07-31 ENCOUNTER — Other Ambulatory Visit: Payer: Self-pay

## 2019-07-31 DIAGNOSIS — R911 Solitary pulmonary nodule: Secondary | ICD-10-CM

## 2019-08-04 ENCOUNTER — Ambulatory Visit (INDEPENDENT_AMBULATORY_CARE_PROVIDER_SITE_OTHER): Payer: 59 | Admitting: Obstetrics & Gynecology

## 2019-08-04 ENCOUNTER — Encounter: Payer: Self-pay | Admitting: Obstetrics & Gynecology

## 2019-08-04 ENCOUNTER — Other Ambulatory Visit: Payer: Self-pay

## 2019-08-04 VITALS — BP 130/88

## 2019-08-04 DIAGNOSIS — A630 Anogenital (venereal) warts: Secondary | ICD-10-CM | POA: Diagnosis not present

## 2019-08-04 DIAGNOSIS — R87810 Cervical high risk human papillomavirus (HPV) DNA test positive: Secondary | ICD-10-CM | POA: Diagnosis not present

## 2019-08-04 DIAGNOSIS — B977 Papillomavirus as the cause of diseases classified elsewhere: Secondary | ICD-10-CM

## 2019-08-04 DIAGNOSIS — Z90710 Acquired absence of both cervix and uterus: Secondary | ICD-10-CM

## 2019-08-04 NOTE — Addendum Note (Signed)
Addended by: Thurnell Garbe A on: 08/04/2019 11:51 AM   Modules accepted: Orders

## 2019-08-04 NOTE — Progress Notes (Signed)
    [name] 06-Sep-1975 [id number]        44 y.o.  G2P2L2  Boyfriend  RP: HR HPV positive 05/2019/Vulvar condylomas for Colposcopy  HPI: S/P LAVH.  Pap test 05/30/2019 Negative/HPV HR pos.  HPV 16-18-45 Negative.  Vulvar condylomas.  Vulvar Bxs 06/2019 Nevi.  H/O Genital HSV.   OB History  Gravida Para Term Preterm AB Living  2 2     0 2  SAB TAB Ectopic Multiple Live Births      0        # Outcome Date GA Lbr Len/2nd Weight Sex Delivery Anes PTL Lv  2 Para           1 Para             Past medical history,surgical history, problem list, medications, allergies, family history and social history were all reviewed and documented in the EPIC chart.   Directed ROS with pertinent positives and negatives documented in the history of present illness/assessment and plan.  Exam:  Vitals:   08/04/19 1057  BP: 130/88   General appearance:  Normal  Colposcopy Procedure Note NATARA LEISY 08/04/2019  Indications:  HPV HR positive on vaginal vault 05/2019 and vulvar condylomas  Procedure Details  The risks and benefits of the procedure and Verbal informed consent obtained.  Speculum placed in vagina and excellent visualization of vaginal vault achieved, vagina swabbed x 3 with acetic acid solution.  Findings:  Vaginal colposcopy:  Acetowhite with punctation at central vaginal vault.  Hurricane sprayed.  Biopsy taken.  Silver Nitrate for hemostasis.  Vulvar colposcopy:  Probable small condylomas.  Declines TCA today.  Perirectal colposcopy:  Normal  The cervix was sprayed with Hurricane before performing the cervical biopsies.  Specimens: Vaginal vault biopsy  Complications:  None, good hemostasis with Silver Nitrate . Plan:  Management per vaginal biopsy results   Assessment/Plan:  44 y.o. G2P0002   1. 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  Pap test was negative.  HPV 16-18-45 were negative.  High risk HPV  counseling done.  Colposcopy procedure reviewed with patient.  Colposcopy findings discussed.  Vaginal vault biopsy done.  Management per results.  Post  procedure precautions reviewed.  2. 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  Decision by patient to observe.  Will reevaluate at next annual gynecologic exam.  Will follow-up for TCA treatment if decides to proceed.  3. S/P laparoscopic assisted vaginal hysterectomy (LAVH)  Counseling on above issues and coordination of care more than 50% for 10 minutes.  Princess Bruins MD, 11:06 AM 08/04/2019

## 2019-08-04 NOTE — Patient Instructions (Signed)
1. 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  Pap test was negative.  HPV 16-18-45 were negative.  High risk HPV counseling done.  Colposcopy procedure reviewed with patient.  Colposcopy findings discussed.  Vaginal vault biopsy done.  Management per results.  Post  procedure precautions reviewed.  2. 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  Decision by patient to observe.  Will reevaluate at next annual gynecologic exam.  Will follow-up for TCA treatment if decides to proceed.  3. S/P laparoscopic assisted vaginal hysterectomy (LAVH)  [name], it was a pleasure seeing you today!  I will inform you of your results as soon as they are available.

## 2019-08-06 LAB — TISSUE SPECIMEN

## 2019-08-06 LAB — PATHOLOGY REPORT

## 2019-08-12 NOTE — Telephone Encounter (Signed)
Spoke with patient and informed her. °

## 2019-08-13 ENCOUNTER — Telehealth: Payer: Self-pay | Admitting: Internal Medicine

## 2019-08-13 ENCOUNTER — Telehealth (INDEPENDENT_AMBULATORY_CARE_PROVIDER_SITE_OTHER): Payer: 59 | Admitting: Internal Medicine

## 2019-08-13 DIAGNOSIS — R072 Precordial pain: Secondary | ICD-10-CM | POA: Diagnosis not present

## 2019-08-13 NOTE — Telephone Encounter (Signed)
Left message for patient to call and schedule 6 week follow up appointment with Dr. Margaretann Loveless

## 2019-08-13 NOTE — Progress Notes (Signed)
Virtual Visit via Telephone Note   This visit type was conducted due to national recommendations for restrictions regarding the COVID-19 Pandemic (e.g. social distancing) in an effort to limit this patient's exposure and mitigate transmission in our community.  Due to her co-morbid illnesses, this patient is at least at moderate risk for complications without adequate follow up.  This format is felt to be most appropriate for this patient at this time.  The patient did not have access to video technology/had technical difficulties with video requiring transitioning to audio format only (telephone).  All issues noted in this document were discussed and addressed.  No physical exam could be performed with this format.  Please refer to the patient's chart for her  consent to telehealth for [facility].   Date:  08/13/2019   ID:  [name], DOB Nov 06, 1975, MRN [id number]  Patient Location: Home Provider Location: Home  PCP:  Tamsen Roers, MD  Cardiologist:  Elouise Munroe, MD  Electrophysiologist:  None   Evaluation Performed:  Follow-Up Visit  Chief Complaint:  Chest pain, SOB  History of Present Illness:    [name] is a 44 y.o. female with hx of asthma, depression, type 1 diabetes, endometriosis, GERD, hyperlipidemia, dyspnea.  I initially met the patient in emergency department follow-up in our [facility] office in November 2019.  At that time she describes mid chest heaviness that radiates to her teeth, jaws, left shoulder while exerting herself and improving with rest.  She also had exertional shortness of breath.  She noted a sense of difficulty breathing as soon as she wakes up in the morning.  She has type 1 diabetes and tells me that her mother has coronary artery disease in the setting of type 1 diabetes as well.  We performed a Myoview at that time which was low risk. . She continues to have chest pain in the morning when she wakes up and feels short of  breath.  She has significant productive cough for almost 15 minutes and after she is able to expectorate sputum, her shortness of breath improves.  Coughing however does not improve her chest pain and the chest pain happens as soon as she opens her eyes in the morning.  Discomfort will last for approximately 30 to 45 minutes.  She notices the symptoms primarily now when she is lying down to sleep at night.  She also notes a shortness of breath with activity such as vacuuming.  When she uses her inhaler for asthma it does not help with her chest pain.  She snores and uses a CPAP.  She continues to smoke 1-1/2 packs/day.  She is in the precontemplative phase of smoking cessation.  She is on my colleague Rosaria Ferries, PA-C in virtual follow-up in November 2020.  After around his visit we had intended for the patient to get a coronary CTA with evaluation of the lung fields due to a previously noted 2 mm nodule.  Unfortunately she only was scheduled for only the noncontrast CT chest portion.  Fortunately her 2 mm left upper lobe nodule is stable and no further follow-up is needed.  Study does not clearly show coronary artery calcifications, but these are described on a CT from October 2019.  The patient does not have symptoms concerning for COVID-19 infection (fever, chills, cough, or new shortness of breath).    Past Medical History:  Diagnosis Date  . Anxiety state, unspecified 03/26/2009  . ASTHMA 02/02/2007  . DEPRESSION  02/16/2009  . DIABETES MELLITUS, TYPE I 02/02/2007  . Endometriosis   . GERD 12/21/2008  . Herpes    at 18  . Hyperlipidemia   . Lesion of vocal cord   . Shortness of breath 09/25/2007  . Vocal fold leukoplakia    Past Surgical History:  Procedure Laterality Date  . ABDOMINAL HYSTERECTOMY  2009   BSO  . DIRECT LARYNGOSCOPY N/A 08/17/2017   Procedure: DIRECT LARYNGOSCOPY WITH OPERATING TELESCOPE WITH BIOPSY;  Surgeon: Helayne Seminole, MD;  Location: [facility];  Service: ENT;   Laterality: N/A;  . NASOPHARYNGEAL BIOPSY Right 08/17/2017   Procedure: NASOPHARYNGEAL BIOPSY RIGHT NASAL LESION;  Surgeon: Helayne Seminole, MD;  Location: [facility];  Service: ENT;  Laterality: Right;     Current Meds  Medication Sig  . albuterol (PROVENTIL HFA;VENTOLIN HFA) 108 (90 Base) MCG/ACT inhaler Inhale 2 puffs into the lungs every 6 (six) hours as needed for wheezing or shortness of breath.  . ALPRAZolam (XANAX) 0.25 MG tablet Take 0.25 mg by mouth 3 (three) times daily as needed for anxiety.  Marland Kitchen atorvastatin (LIPITOR) 10 MG tablet Take 1 tablet (10 mg total) by mouth at bedtime.  . Blood Glucose Monitoring Suppl (ACCU-CHEK AVIVA PLUS) W/DEVICE KIT 1 Device by Does not apply route once.  . budesonide-formoterol (SYMBICORT) 80-4.5 MCG/ACT inhaler Inhale 2 puffs into the lungs 2 (two) times daily.  . Cholecalciferol (VITAMIN D3) 50 MCG (2000 UT) capsule Take 1,000 Units by mouth daily.   Marland Kitchen gabapentin (NEURONTIN) 300 MG capsule Take 1 capsule (300 mg total) by mouth 3 (three) times daily. As needed  . glucagon 1 MG injection Inject 1 mg into the muscle once as needed.  Marland Kitchen glucose blood (ACCU-CHEK AVIVA PLUS) test strip USE TO TEST BLOOD SUGAR 7 TIMES DAILY, and lancets 7/day. DX E10.41  . Insulin Pen Needle 31G X 8 MM MISC Use as directed   . meclizine (ANTIVERT) 25 MG tablet Take 1 tablet (25 mg total) by mouth 3 (three) times daily as needed for dizziness.  . Multiple Vitamin (MULTIVITAMIN WITH MINERALS) TABS tablet Take 1 tablet by mouth daily.  Marland Kitchen NOVOLOG 100 UNIT/ML injection INJECT 40 UNITS DAILY PER INSULIN PUMP (MAX DOSAGE)  . omeprazole (PRILOSEC) 40 MG capsule Take 40 mg by mouth daily before breakfast.  . oxyCODONE-acetaminophen (PERCOCET) 10-325 MG tablet SMARTSIG:1 Pill By Mouth 1 to 4 Times Daily PRN  . PARoxetine (PAXIL) 40 MG tablet Take 0.5 tablets (20 mg total) by mouth daily.  Marland Kitchen rOPINIRole (REQUIP) 1 MG tablet TAKE 1 TABLET (1 MG TOTAL) BY MOUTH AT BEDTIME.  Marland Kitchen zinc  gluconate 50 MG tablet Take 50 mg by mouth daily.     Allergies:   Patient has no known allergies.   Social History   Tobacco Use  . Smoking status: Current Every Day Smoker    Packs/day: 1.50    Years: 25.00    Pack years: 37.50    Types: Cigarettes  . Smokeless tobacco: Never Used  . Tobacco comment: 1PPD as of 07/2018  Substance Use Topics  . Alcohol use: Yes    Alcohol/week: 0.0 standard drinks  . Drug use: No     Family Hx: The patient's family history includes Diabetes in her maternal aunt, maternal grandfather, and mother; Emphysema in her maternal aunt and maternal uncle; Lung cancer in her maternal aunt. There is no history of Colon cancer, Kidney disease, or Liver disease.  ROS:   Please see the history of present  illness.     All other systems reviewed and are negative.   Prior CV studies:   The following studies were reviewed today:  Myoview November 2019  Labs/Other Tests and Data Reviewed:    EKG:  No ECG reviewed.  Recent Labs: 09/18/2018: BUN 7; Creatinine, Ser 0.65; Hemoglobin 14.9; Platelets 202.0; Potassium 4.1; Sodium 134   Recent Lipid Panel Lab Results  Component Value Date/Time   CHOL 220 (H) 05/12/2010 11:52 AM   TRIG 200.0 (H) 05/12/2010 11:52 AM   HDL 49.90 05/12/2010 11:52 AM   CHOLHDL 4 05/12/2010 11:52 AM   LDLDIRECT 142.7 05/12/2010 11:52 AM    Wt Readings from Last 3 Encounters:  08/13/19 126 lb (57.2 kg)  06/05/19 117 lb (53.1 kg)  05/30/19 122 lb (55.3 kg)     Objective:    Vital Signs:  Ht 5' 5.5" (1.664 m)   Wt 126 lb (57.2 kg)   BMI 20.65 kg/m    VITAL SIGNS:  reviewed GEN:  no acute distress Respiratory: No increased work of breathing, talks comfortably in full sentences NEURO:  alert and oriented x 3 PSYCH:  normal affect  ASSESSMENT & PLAN:    Chest pain-I remain concerned about her chest pain in the setting of a history of diabetes mellitus type 1, and suboptimally controlled risk factors in the setting of  smoking as well.  We need to define her coronary anatomy we will do this with a CT coronary angiogram.  I described for the patient with the test entails.  She request strongly this test not be scheduled at the hospital, however that is where one of our optimal cardiovascular scanners is.  We will schedule her at her [facility] location in [place] if she chooses to go there.  Given her history of diabetes, there is a chance she will have coronary artery disease, and the best imaging possible is required.  Left upper lobe nodule-stable at 1 year follow-up, no further imaging required.  Tobacco abuse-encourage cessation, she is precontemplative  OSA on CPAP-continue use.  Diabetes mellitus type 1-she is currently on atorvastatin 10 mg daily.  Management of diabetes per primary care.  COVID-19 Education: The signs and symptoms of COVID-19 were discussed with the patient and how to seek care for testing (follow up with PCP or arrange E-visit).  The importance of social distancing was discussed today.  Time:   Today, I have spent 30 minutes on this encounter, with greater than 50% of that time spent discussing with the patient with telehealth technology discussing the above problems.     Medication Adjustments/Labs and Tests Ordered: Current medicines are reviewed at length with the patient today.  Concerns regarding medicines are outlined above.   Tests Ordered: Orders Placed This Encounter  Procedures  . CT CORONARY MORPH W/CTA COR W/SCORE W/CA W/CM &/OR WO/CM  . CT CORONARY FRACTIONAL FLOW RESERVE DATA PREP  . CT CORONARY FRACTIONAL FLOW RESERVE FLUID ANALYSIS  . Basic metabolic panel    Medication Changes: No orders of the defined types were placed in this encounter.   Follow Up:  Follow-up 6 weeks after CT scan is performed.  Signed, Elouise Munroe, MD  08/13/2019 1:57 PM    [facility]

## 2019-08-13 NOTE — Patient Instructions (Signed)
Medication Instructions:  Awaiting metoprolol dose.  *If you need a refill on your cardiac medications before your next appointment, please call your pharmacy*  Lab Work: BMET one week before CT If you have labs (blood work) drawn today and your tests are completely normal, you will receive your results only by: Marland Kitchen MyChart Message (if you have MyChart) OR . A paper copy in the mail If you have any lab test that is abnormal or we need to change your treatment, we will call you to review the results.  Testing/Procedures: Your physician has requested that you have cardiac CT. Cardiac computed tomography (CT) is a painless test that uses an x-ray machine to take clear, detailed pictures of your heart. For further information please visit [email or web address]. Please follow instruction sheet as given.  Follow-Up: At [facility], you and your health needs are our priority.  As part of our continuing mission to provide you with exceptional heart care, we have created designated Provider Care Teams.  These Care Teams include your primary Cardiologist (physician) and Advanced Practice Providers (APPs -  Physician Assistants and Nurse Practitioners) who all work together to provide you with the care you need, when you need it.  Your next appointment:   6 week(s) after CTA is completed.  The format for your next appointment:   Either In Person or Virtual  Provider:   Cherlynn Kaiser, MD  Other Instructions  Your cardiac CT will be scheduled at one of the below locations:   [facility] [address] [place], [place] [address] [phone]  If scheduled at [facility], please arrive 15 mins early for check-in and test prep.  Please follow these instructions carefully (unless otherwise directed):  Hold all erectile dysfunction medications at least 3 days (72 hrs) prior to test.  On the Night Before the Test: . Be  sure to Drink plenty of water. . Do not consume any caffeinated/decaffeinated beverages or chocolate 12 hours prior to your test. . Do not take any antihistamines 12 hours prior to your test.  On the Day of the Test: . Drink plenty of water. Do not drink any water within one hour of the test. . Do not eat any food 4 hours prior to the test. . You may take your regular medications prior to the test.  . Take metoprolol (Lopressor) two hours prior to test. . HOLD Furosemide/Hydrochlorothiazide morning of the test. . FEMALES- please wear underwire-free bra if available   *For Clinical Staff only. Please instruct patient the following:*        -Drink plenty of water       -Hold Furosemide/hydrochlorothiazide morning of the test       -Take metoprolol (Lopressor) 2 hours prior to test (if applicable).                  -If HR is less than 55 BPM- No Beta Blocker                -IF HR is greater than 55 BPM and patient is less than or equal to 19 yrs old Lopressor 100mg  x1.                -If HR is greater than 55 BPM and patient is greater than 65 yrs old Lopressor 50 mg x1.     Do not give Lopressor to patients with an allergy to lopressor or anyone with asthma or active COPD symptoms (currently  taking steroids).       After the Test: . Drink plenty of water. . After receiving IV contrast, you may experience a mild flushed feeling. This is normal. . On occasion, you may experience a mild rash up to 24 hours after the test. This is not dangerous. If this occurs, you can take Benadryl 25 mg and increase your fluid intake. . If you experience trouble breathing, this can be serious. If it is severe call 911 IMMEDIATELY. If it is mild, please call our office. . If you take any of these medications: Glipizide/Metformin, Avandament, Glucavance, please do not take 48 hours after completing test unless otherwise instructed.   Once we have confirmed authorization from your insurance company, we  will call you to set up a date and time for your test.   For non-scheduling related questions, please contact the cardiac imaging nurse navigator should you have any questions/concerns: Marchia Bond, RN Navigator Cardiac Imaging [facility] [phone] Office

## 2019-08-18 ENCOUNTER — Telehealth: Payer: Self-pay | Admitting: Internal Medicine

## 2019-08-18 NOTE — Telephone Encounter (Signed)
New Message    Pt is calling to schedule CT      Please call back

## 2019-08-21 ENCOUNTER — Telehealth: Payer: Self-pay | Admitting: Endocrinology

## 2019-08-21 NOTE — Telephone Encounter (Signed)
MEDICATION: One Touch Ultra Test Strips  PHARMACY:  [facility] [place] [facility] [address] : unknown  IS PATIENT OUT OF MEDICATION: yes  IF NOT; HOW MUCH IS LEFT:   LAST APPOINTMENT DATE: @12 /28/2020  NEXT APPOINTMENT DATE:  ????  DO WE HAVE YOUR PERMISSION TO LEAVE A DETAILED MESSAGE: yes  OTHER COMMENTS: attempted to schedule for follow up, but Patient asked to call back and schedule due to family reasons   **Let patient know to contact pharmacy at the end of the day to make sure medication is ready. **  ** Please notify patient to allow 48-72 hours to process**  **Encourage patient to contact the pharmacy for refills or they can request refills through [facility]**

## 2019-08-22 ENCOUNTER — Telehealth: Payer: Self-pay

## 2019-08-22 NOTE — Telephone Encounter (Signed)
OV is needed

## 2019-08-22 NOTE — Telephone Encounter (Signed)
Per Dr. [name], unable to  Complete CMN for [employer] without an appt. LVM requesting returned call. Letter has also been sent to pt via My Chart re: need to schedule appt. Letter included below:  Letter by Aleatha Borer, LPN on [id number]    [facility] [address], [place], [place]  [address] Phone:  [phone]   Fax:  [phone]  August 22, 2019  SUELLA WILMORE [address] [place] [address]  Dear Ms. Pollmann  We received a refill request from [employer] to complete a Certificate of Medical Necessity for your pump supplies. In order to complete this form and to fulfill you refill request, a follow up office visit is required.  Please call our office to schedule this appointment. Please note - your refill request AND completion of this form remains on hold until you have attended your appointment. We look forward to hearing from you soon.  Sincerely,  [facility]

## 2019-08-22 NOTE — Telephone Encounter (Signed)
Attempted to call pt and schedule f/u visit. Pt did not answer and voicemail box was full. Forwarding to admin pool to follow up

## 2019-08-22 NOTE — Telephone Encounter (Signed)
Pt has not been seen since July 2020. Refill or deny?

## 2019-09-02 ENCOUNTER — Telehealth: Payer: Self-pay | Admitting: Nutrition

## 2019-09-02 NOTE — Telephone Encounter (Signed)
[employer] request forms faxed to [employer] for insurance verification and cost. On 06/18/19

## 2019-09-04 NOTE — Telephone Encounter (Signed)
I left a VM requesting a call back to schedule and also sent My Chart message

## 2019-09-09 ENCOUNTER — Encounter: Payer: Self-pay | Admitting: Endocrinology

## 2019-09-09 ENCOUNTER — Other Ambulatory Visit: Payer: Self-pay | Admitting: Family Medicine

## 2019-09-09 NOTE — Telephone Encounter (Signed)
Please refer to Dr. Ellison's response 

## 2019-09-09 NOTE — Telephone Encounter (Signed)
Can you help with this one. she is/was a [facility] patient but his PCP left. Can you contact the team lead there and have them sort out if patient needs ot be reassigned or if she has switched practices. If she has established with a new practice she will have to get refill there. If she is changing but has not seen them yet. We have an obligation as a practice to cover her meds for 30 days.

## 2019-09-09 NOTE — Telephone Encounter (Signed)
LMTCB x 2 to make an appt, mychart message has been sent and I am now mailing a letter.  No further attempts

## 2019-09-09 NOTE — Telephone Encounter (Signed)
Please do not send refill requests here, unless it is for a reason that I see pt for.

## 2019-09-09 NOTE — Telephone Encounter (Signed)
SE-Can you please see refill request? Dr. Zigmund Daniel is no longer at this office and pt is requesting refill for Ropinerole/plz advise/thx dmf

## 2019-09-10 ENCOUNTER — Telehealth (HOSPITAL_COMMUNITY): Payer: Self-pay | Admitting: Emergency Medicine

## 2019-09-10 ENCOUNTER — Other Ambulatory Visit: Payer: Self-pay

## 2019-09-10 ENCOUNTER — Telehealth: Payer: Self-pay

## 2019-09-10 ENCOUNTER — Encounter (HOSPITAL_COMMUNITY): Payer: Self-pay

## 2019-09-10 ENCOUNTER — Telehealth: Payer: Self-pay | Admitting: Endocrinology

## 2019-09-10 DIAGNOSIS — E1049 Type 1 diabetes mellitus with other diabetic neurological complication: Secondary | ICD-10-CM

## 2019-09-10 DIAGNOSIS — IMO0002 Reserved for concepts with insufficient information to code with codable children: Secondary | ICD-10-CM

## 2019-09-10 MED ORDER — ONETOUCH ULTRA VI STRP: 1.0000 | ORAL_STRIP | Freq: Two times a day (BID) | 0 refills | Status: DC

## 2019-09-10 NOTE — Telephone Encounter (Signed)
Outpatient Medication Detail   Disp Refills Start End   glucose blood (ONETOUCH ULTRA) test strip 60 each 0 09/10/2019    Sig - Route: 1 each by Other route 2 (two) times daily. OVERDUE FOR AN APPT. WILL ONLY PROVIDE 30 DAY SUPPLY - Other   Sent to pharmacy as: glucose blood (ONETOUCH ULTRA) test strip   E-Prescribing Status: Receipt confirmed by pharmacy (09/10/2019  1:03 PM EST)

## 2019-09-10 NOTE — Telephone Encounter (Signed)
MEDICATION: On Touch Ultra  [facility] [address] [facility] [place] [facility]   IS PATIENT OUT OF MEDICATION: yes  IF NOT; HOW MUCH IS LEFT:   LAST APPOINTMENT DATE: @2 /11/2019  NEXT APPOINTMENT DATE:@3 /07/2019  DO WE HAVE YOUR PERMISSION TO LEAVE A DETAILED MESSAGE: yes  OTHER COMMENTS:    **Let patient know to contact pharmacy at the end of the day to make sure medication is ready. **  ** Please notify patient to allow 48-72 hours to process**  **Encourage patient to contact the pharmacy for refills or they can request refills through [facility]**

## 2019-09-10 NOTE — Telephone Encounter (Signed)
I LMOVM for pt to call back to confirm if she wants to sched a TOC visit or if she will be following Dr. Zigmund Daniel or going elsewhere as we received a refill request for Ropinerole/will await return call also noted this in the refill request/thx dmf

## 2019-09-10 NOTE — Telephone Encounter (Signed)
I LMOVM for pt to call back to confirm if she wants to sched a TOC visit or if she will be following Dr. Zigmund Daniel or going elsewhere as we received a refill request/created phone note/thx dmf

## 2019-09-10 NOTE — Telephone Encounter (Signed)
Left message on voicemail with name and callback number Olamide Lahaie RN Navigator Cardiac Imaging [facility] [phone] Office [phone] Cell  

## 2019-09-11 ENCOUNTER — Other Ambulatory Visit: Payer: Self-pay | Admitting: Internal Medicine

## 2019-09-11 ENCOUNTER — Other Ambulatory Visit: Payer: Self-pay

## 2019-09-11 ENCOUNTER — Telehealth (HOSPITAL_COMMUNITY): Payer: Self-pay | Admitting: Emergency Medicine

## 2019-09-11 ENCOUNTER — Ambulatory Visit
Admission: RE | Admit: 2019-09-11 | Discharge: 2019-09-11 | Disposition: A | Discharge status: Home / Self Care | Payer: 59 | Source: Ambulatory Visit | Attending: Internal Medicine | Admitting: Internal Medicine

## 2019-09-11 DIAGNOSIS — R072 Precordial pain: Secondary | ICD-10-CM

## 2019-09-11 LAB — POCT I-STAT CREATININE: Creatinine, Ser: 0.8 mg/dL (ref 0.44–1.00)

## 2019-09-11 MED ORDER — DILTIAZEM HCL 25 MG/5ML IV SOLN
5.0000 mg | Freq: Once | INTRAVENOUS | Status: AC
  Administered 2019-09-11: 5 mg via INTRAVENOUS

## 2019-09-11 MED ORDER — METOPROLOL TARTRATE 100 MG PO TABS
100.0000 mg | ORAL_TABLET | Freq: Once | ORAL | Status: AC
  Administered 2019-09-11: 100 mg via ORAL

## 2019-09-11 MED ORDER — IOHEXOL 350 MG/ML SOLN
75.0000 mL | Freq: Once | INTRAVENOUS | Status: AC | PRN
  Administered 2019-09-11: 75 mL via INTRAVENOUS

## 2019-09-11 MED ORDER — NITROGLYCERIN 0.4 MG SL SUBL
0.8000 mg | SUBLINGUAL_TABLET | Freq: Once | SUBLINGUAL | Status: AC
  Administered 2019-09-11: 0.8 mg via SUBLINGUAL

## 2019-09-11 MED ORDER — METOPROLOL TARTRATE 5 MG/5ML IV SOLN
10.0000 mg | Freq: Once | INTRAVENOUS | Status: AC
  Administered 2019-09-11 (×2): 10 mg via INTRAVENOUS

## 2019-09-11 NOTE — Telephone Encounter (Signed)
Pt called me after hours leaving VM on my answering machine asking for directions to [facility] for her CCTA appt today.   I attempted to call her back but she was not available and her VM box is full and cannot take new messages.   Will try to reach her again soon  Marchia Bond RN [facility] [phone] Office  [phone] Cell

## 2019-09-11 NOTE — Telephone Encounter (Signed)
FYI

## 2019-09-11 NOTE — Progress Notes (Signed)
Unable to achieve heart rate goal of less than 60 for Cardiac CT scan. See MAR for medications given to lower heart rate. Patient also extremely anxious and states she took home prescription of anti-anxiety medication prior to arrival. Patient aware that scan will need to be rescheduled. CT heart navigator nurse, Marchia Bond notified.

## 2019-09-15 ENCOUNTER — Other Ambulatory Visit: Payer: Self-pay

## 2019-09-15 ENCOUNTER — Encounter: Payer: Self-pay | Admitting: Endocrinology

## 2019-09-15 ENCOUNTER — Ambulatory Visit (INDEPENDENT_AMBULATORY_CARE_PROVIDER_SITE_OTHER): Payer: 59 | Admitting: Endocrinology

## 2019-09-15 VITALS — BP 130/80 | HR 98 | Ht 65.5 in | Wt 128.6 lb

## 2019-09-15 DIAGNOSIS — IMO0002 Reserved for concepts with insufficient information to code with codable children: Secondary | ICD-10-CM

## 2019-09-15 DIAGNOSIS — E1049 Type 1 diabetes mellitus with other diabetic neurological complication: Secondary | ICD-10-CM | POA: Diagnosis not present

## 2019-09-15 DIAGNOSIS — E1065 Type 1 diabetes mellitus with hyperglycemia: Secondary | ICD-10-CM

## 2019-09-15 LAB — POCT GLYCOSYLATED HEMOGLOBIN (HGB A1C): Hemoglobin A1C: 9.3 % — AB (ref 4.0–5.6)

## 2019-09-15 MED ORDER — ONETOUCH ULTRA VI STRP: 1.0000 | ORAL_STRIP | Freq: Four times a day (QID) | 3 refills | Status: DC

## 2019-09-15 NOTE — Progress Notes (Signed)
Subjective:    Patient ID: [name], female    DOB: [birth year], 44 y.o.   MRN: [id number]  HPI Pt returns for f/u of diabetes mellitus: DM type: 1 Dx'ed: [id number] Complications: polyneuropathy and retinopathy.  Therapy: insulin since dx, pump rx since 2017.  DKA: never Severe hypoglycemia: never Pancreatitis: never Other: therapy has been limited by chronic noncompliance; she has had TAH;  She declines continuous glucose monitor.   Interval history: she takes these pump settings: basal rate of 0.4 units/hr, 10 PM-6 AM, and 1 unit/hr, 6 AM-10 PM.  Suspend for 1-2 hrs for activity.   No mealtime bolus.   correction bolus (which some people call "sensitivity," or "insulin sensitivity ratio," or just "isr") of 1 unit for each 100 by which glucose exceeds 100.   no cbg record, but states cbg's are approx 200.  There is no trend throughout the day.   Pt says a1c is worse because pump no longer works (especially the basal setting).  She can no longer see the screen, either.   Past Medical History:  Diagnosis Date  . Anxiety state, unspecified 03/26/2009  . ASTHMA 02/02/2007  . DEPRESSION 02/16/2009  . DIABETES MELLITUS, TYPE I 02/02/2007  . Endometriosis   . GERD 12/21/2008  . Herpes    at 18  . Hyperlipidemia   . Lesion of vocal cord   . Shortness of breath 09/25/2007  . Vocal fold leukoplakia     Past Surgical History:  Procedure Laterality Date  . ABDOMINAL HYSTERECTOMY  2009   BSO  . DIRECT LARYNGOSCOPY N/A 08/17/2017   Procedure: DIRECT LARYNGOSCOPY WITH OPERATING TELESCOPE WITH BIOPSY;  Surgeon: Helayne Seminole, MD;  Location: [place];  Service: ENT;  Laterality: N/A;  . NASOPHARYNGEAL BIOPSY Right 08/17/2017   Procedure: NASOPHARYNGEAL BIOPSY RIGHT NASAL LESION;  Surgeon: Helayne Seminole, MD;  Location: [place];  Service: ENT;  Laterality: Right;    Social History   Socioeconomic History  . Marital status: Legally Separated    Spouse name: Not on file  . Number of  children: 2  . Years of education: Not on file  . Highest education level: Not on file  Occupational History    Employer: UNEMPLOYED  Tobacco Use  . Smoking status: Current Every Day Smoker    Packs/day: 1.50    Years: 25.00    Pack years: 37.50    Types: Cigarettes  . Smokeless tobacco: Never Used  . Tobacco comment: 1PPD as of 07/2018  Substance and Sexual Activity  . Alcohol use: Not Currently    Alcohol/week: 0.0 standard drinks  . Drug use: No  . Sexual activity: Yes    Partners: Male    Birth control/protection: None    Comment: 1st intercourse-14, partners- 1, current partner- 4 months  Other Topics Concern  . Not on file  Social History Narrative   2 children--pt has custody. Husband pays child support.   Daily Caffeine Use-3 2 liters a day   Pt does not get regular exercise.   Social Determinants of Health   Financial Resource Strain:   . Difficulty of Paying Living Expenses: Not on file  Food Insecurity:   . Worried About Charity fundraiser in the Last Year: Not on file  . Ran Out of Food in the Last Year: Not on file  Transportation Needs:   . Lack of Transportation (Medical): Not on file  . Lack of Transportation (Non-Medical): Not on file  Physical Activity:   .  Days of Exercise per Week: Not on file  . Minutes of Exercise per Session: Not on file  Stress:   . Feeling of Stress : Not on file  Social Connections:   . Frequency of Communication with Friends and Family: Not on file  . Frequency of Social Gatherings with Friends and Family: Not on file  . Attends Religious Services: Not on file  . Active Member of Clubs or Organizations: Not on file  . Attends Archivist Meetings: Not on file  . Marital Status: Not on file  Intimate Partner Violence:   . Fear of Current or Ex-Partner: Not on file  . Emotionally Abused: Not on file  . Physically Abused: Not on file  . Sexually Abused: Not on file    Current Outpatient Medications on File  Prior to Visit  Medication Sig Dispense Refill  . albuterol (PROVENTIL HFA;VENTOLIN HFA) 108 (90 Base) MCG/ACT inhaler Inhale 2 puffs into the lungs every 6 (six) hours as needed for wheezing or shortness of breath. 1 Inhaler 2  . ALPRAZolam (XANAX) 0.25 MG tablet Take 0.25 mg by mouth 3 (three) times daily as needed for anxiety.    Marland Kitchen atorvastatin (LIPITOR) 10 MG tablet Take 1 tablet (10 mg total) by mouth at bedtime. 90 tablet 1  . budesonide-formoterol (SYMBICORT) 80-4.5 MCG/ACT inhaler Inhale 2 puffs into the lungs 2 (two) times daily. 1 Inhaler 5  . Cholecalciferol (VITAMIN D3) 50 MCG (2000 UT) capsule Take 1,000 Units by mouth daily.     Marland Kitchen gabapentin (NEURONTIN) 300 MG capsule Take 1 capsule (300 mg total) by mouth 3 (three) times daily. As needed 270 capsule 1  . glucagon 1 MG injection Inject 1 mg into the muscle once as needed. 1 each 12  . Insulin Pen Needle 31G X 8 MM MISC Use as directed     . meclizine (ANTIVERT) 25 MG tablet Take 1 tablet (25 mg total) by mouth 3 (three) times daily as needed for dizziness. 30 tablet 0  . Multiple Vitamin (MULTIVITAMIN WITH MINERALS) TABS tablet Take 1 tablet by mouth daily.    Marland Kitchen NOVOLOG 100 UNIT/ML injection INJECT 40 UNITS DAILY PER INSULIN PUMP (MAX DOSAGE) 10 mL 0  . omeprazole (PRILOSEC) 40 MG capsule Take 40 mg by mouth daily before breakfast.  2  . oxyCODONE-acetaminophen (PERCOCET) 10-325 MG tablet SMARTSIG:1 Pill By Mouth 1 to 4 Times Daily PRN    . PARoxetine (PAXIL) 40 MG tablet Take 0.5 tablets (20 mg total) by mouth daily. 90 tablet 1  . rOPINIRole (REQUIP) 1 MG tablet TAKE 1 TABLET (1 MG TOTAL) BY MOUTH AT BEDTIME. 90 tablet 0  . zinc gluconate 50 MG tablet Take 50 mg by mouth daily.     No current facility-administered medications on file prior to visit.    No Known Allergies  Family History  Problem Relation Age of Onset  . Diabetes Mother   . Diabetes Maternal Aunt   . Emphysema Maternal Aunt   . Emphysema Maternal Uncle     . Diabetes Maternal Grandfather   . Lung cancer Maternal Aunt   . Colon cancer Neg Hx   . Kidney disease Neg Hx   . Liver disease Neg Hx     BP 130/80 (BP Location: Left Arm, Patient Position: Sitting, Cuff Size: Normal)   Pulse 98   Ht 5' 5.5" (1.664 m)   Wt 128 lb 9.6 oz (58.3 kg)   SpO2 97%   BMI 21.07 kg/m  Review of Systems She denies hypoglycemia.      Objective:   Physical Exam VITAL SIGNS:  See vs page GENERAL: no distress Pulses: dorsalis pedis intact bilat.   MSK: no deformity of the feet CV: no leg edema Skin:  no ulcer on the feet.  normal color and temp on the feet. Neuro: sensation is intact to touch on the feet  Lab Results  Component Value Date   HGBA1C 9.3 (A) 09/15/2019       Assessment & Plan:  Type 1 DM, with DR: worse.  Device malfunction: we need to address this first.   Patient Instructions  Please take these settings:  basal rate of 0.4 units/hr, 10 PM-6 AM, and 1 unit/hr, 6 AM-10 PM.  Suspend for 1-2 hrs for activity.   No mealtime bolus.   correction bolus (which some people call "sensitivity," or "insulin sensitivity ratio," or just "isr") of 1 unit for each 100 by which your glucose exceeds 100.   On this type of insulin schedule, you should eat meals on a regular schedule.  If a meal is missed or significantly delayed, your blood sugar could go low.  I have asked Vaughan Basta, to see if you can get a new pump.  check your blood sugar 4 times a day: before the 3 meals, and at bedtime.  also check if you have symptoms of your blood sugar being too high or too low.  please keep a record of the readings and bring it to your next appointment here (or you can bring the meter itself).  You can write it on any piece of paper.  please call us sooner if your blood sugar goes below 70, or if you have a lot of readings over 200.  Please come back for a follow-up appointment in 3 months.

## 2019-09-15 NOTE — Patient Instructions (Addendum)
Please take these settings:  basal rate of 0.4 units/hr, 10 PM-6 AM, and 1 unit/hr, 6 AM-10 PM.  Suspend for 1-2 hrs for activity.   No mealtime bolus.   correction bolus (which some people call "sensitivity," or "insulin sensitivity ratio," or just "isr") of 1 unit for each 100 by which your glucose exceeds 100.   On this type of insulin schedule, you should eat meals on a regular schedule.  If a meal is missed or significantly delayed, your blood sugar could go low.  I have asked [name], to see if you can get a new pump.  check your blood sugar 4 times a day: before the 3 meals, and at bedtime.  also check if you have symptoms of your blood sugar being too high or too low.  please keep a record of the readings and bring it to your next appointment here (or you can bring the meter itself).  You can write it on any piece of paper.  please call us sooner if your blood sugar goes below 70, or if you have a lot of readings over 200.  Please come back for a follow-up appointment in 3 months.

## 2019-09-30 ENCOUNTER — Telehealth (INDEPENDENT_AMBULATORY_CARE_PROVIDER_SITE_OTHER): Payer: 59 | Admitting: Internal Medicine

## 2019-09-30 ENCOUNTER — Telehealth: Payer: Self-pay

## 2019-09-30 NOTE — Telephone Encounter (Signed)
Attempted to reach patient, patient did not answer phone calls X3. Voicemail left to ask patient to call back to reschedule. Okay to reschedule virtual visit with an APP.

## 2019-09-30 NOTE — Progress Notes (Signed)
Patient did not answer the telephone for our virtual visit today. Left a message to reschedule.

## 2019-10-03 ENCOUNTER — Other Ambulatory Visit: Payer: Self-pay | Admitting: Endocrinology

## 2019-10-03 ENCOUNTER — Telehealth: Payer: Self-pay | Admitting: Internal Medicine

## 2019-10-03 DIAGNOSIS — E1049 Type 1 diabetes mellitus with other diabetic neurological complication: Secondary | ICD-10-CM

## 2019-10-03 DIAGNOSIS — IMO0002 Reserved for concepts with insufficient information to code with codable children: Secondary | ICD-10-CM

## 2019-10-03 NOTE — Telephone Encounter (Signed)
°  Pt calling back, she said during her virtual appt with Dr. Margaretann Loveless on 09/30/19,p her phone froze and couldn't answer the phone, she went to her phone provider and it just got fix today, she wanted to speak with Dr. Delphina Cahill nurse, she has some questions to discuss with her.  Please call

## 2019-10-03 NOTE — Telephone Encounter (Signed)
Returned call to pt she states that her phone quit working  And was unable to proceed with VV. New appt scheduled and pt would like telephone visit only. She cannot do a video visit.

## 2019-10-06 ENCOUNTER — Ambulatory Visit (INDEPENDENT_AMBULATORY_CARE_PROVIDER_SITE_OTHER): Payer: 59 | Admitting: Gastroenterology

## 2019-10-06 ENCOUNTER — Other Ambulatory Visit: Payer: Self-pay

## 2019-10-06 ENCOUNTER — Encounter: Payer: Self-pay | Admitting: Gastroenterology

## 2019-10-06 VITALS — BP 90/68 | HR 116 | Temp 98.4°F | Ht 65.0 in | Wt 126.5 lb

## 2019-10-06 DIAGNOSIS — R1310 Dysphagia, unspecified: Secondary | ICD-10-CM

## 2019-10-06 DIAGNOSIS — R0789 Other chest pain: Secondary | ICD-10-CM | POA: Diagnosis not present

## 2019-10-06 DIAGNOSIS — Z01818 Encounter for other preprocedural examination: Secondary | ICD-10-CM

## 2019-10-06 DIAGNOSIS — K219 Gastro-esophageal reflux disease without esophagitis: Secondary | ICD-10-CM

## 2019-10-06 MED ORDER — OMEPRAZOLE 40 MG PO CPDR: 40.0000 mg | DELAYED_RELEASE_CAPSULE | Freq: Two times a day (BID) | ORAL | 3 refills | Status: DC

## 2019-10-06 MED ORDER — SUCRALFATE 1 GM/10ML PO SUSP: 1.0000 g | Freq: Every day | ORAL | 1 refills | Status: DC

## 2019-10-06 NOTE — Addendum Note (Signed)
Addended by: Candie Mile on: 10/06/2019 03:20 PM   Modules accepted: Orders

## 2019-10-06 NOTE — Progress Notes (Signed)
HPI :  44 year old female here for a follow-up visit for chest pain, history of reflux and dysphagia.  She was last seen in December 2018.  She has a history of insulin-dependent diabetes complicated by neuropathy.  She had a history of abdominal bloating, diarrhea, reflux in the past.  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  Treated empirically with rifaximin in the past.  She has a history of taking omeprazole 40 mg once a day for symptoms of reflux.  She is been on this dose for a few years.  She states for the past year she has been waking up in the morning feeling discomfort in her chest and significant phlegm and mucus.  She states it kind of feels like reflux but is much more severe than usual.  It last for 15 to 20 minutes before eventually improves.  She was treated for her asthma more aggressively and states this did not help at all.  She is tried a CPAP and humidifier in her room which has not helped.  She feels a burning sensation in her chest up into her throat, makes her feel like it is hard to breathe.  She otherwise has dysphagia that is been bothering her for the past year as well.  She has it often to liquids but also with solids at times, she feels it in her lower chest, usually at least once daily.  She does not have any problems taking her pills.  Over time this seems to be progressing.  She has had an EGD in 2009 with Dr. Deatra Ina which showed LA grade a esophagitis.   Prior workup: Colonoscopy 4/09 - normal EGD 5/09 - esophagitis, normal duodenum CT abdomen - 01/2014 - normal, cholelithiasis CT abdomen 04/2011 - normal, cholelithiasis Gastric emptying study 10/2007 - normal   Past Medical History:  Diagnosis Date  . Anxiety state, unspecified 03/26/2009  . ASTHMA 02/02/2007  . DEPRESSION 02/16/2009  . DIABETES MELLITUS, TYPE I 02/02/2007  . Endometriosis   . GERD 12/21/2008  . Herpes    at 18  . Hyperlipidemia   . Lesion of vocal cord   . Shortness of breath 09/25/2007  .  Vocal fold leukoplakia      Past Surgical History:  Procedure Laterality Date  . ABDOMINAL HYSTERECTOMY  2009   BSO  . DIRECT LARYNGOSCOPY N/A 08/17/2017   Procedure: DIRECT LARYNGOSCOPY WITH OPERATING TELESCOPE WITH BIOPSY;  Surgeon: Helayne Seminole, MD;  Location: [place];  Service: ENT;  Laterality: N/A;  . NASOPHARYNGEAL BIOPSY Right 08/17/2017   Procedure: NASOPHARYNGEAL BIOPSY RIGHT NASAL LESION;  Surgeon: Helayne Seminole, MD;  [facility];  Service: ENT;  Laterality: Right;   Family History  Problem Relation Age of Onset  . Diabetes Mother   . Diabetes Maternal Aunt   . Emphysema Maternal Aunt   . Emphysema Maternal Uncle   . Diabetes Maternal Grandfather   . Lung cancer Maternal Aunt   . Colon cancer Neg Hx   . Kidney disease Neg Hx   . Liver disease Neg Hx    Social History   Tobacco Use  . Smoking status: Current Every Day Smoker    Packs/day: 1.50    Years: 25.00    Pack years: 37.50    Types: Cigarettes  . Smokeless tobacco: Never Used  . Tobacco comment: 1PPD as of 07/2018  Substance Use Topics  . Alcohol use: Not Currently    Alcohol/week: 0.0 standard drinks  . Drug use: No  Current Outpatient Medications  Medication Sig Dispense Refill  . albuterol (PROVENTIL HFA;VENTOLIN HFA) 108 (90 Base) MCG/ACT inhaler Inhale 2 puffs into the lungs every 6 (six) hours as needed for wheezing or shortness of breath. 1 Inhaler 2  . ALPRAZolam (XANAX) 0.25 MG tablet Take 0.25 mg by mouth 3 (three) times daily as needed for anxiety.    Marland Kitchen atorvastatin (LIPITOR) 10 MG tablet Take 1 tablet (10 mg total) by mouth at bedtime. 90 tablet 1  . budesonide-formoterol (SYMBICORT) 80-4.5 MCG/ACT inhaler Inhale 2 puffs into the lungs 2 (two) times daily. 1 Inhaler 5  . Cholecalciferol (VITAMIN D3) 50 MCG (2000 UT) capsule Take 1,000 Units by mouth daily.     . cyclobenzaprine (FLEXERIL) 10 MG tablet Take 10 mg by mouth 3 (three) times daily as needed.    . gabapentin  (NEURONTIN) 300 MG capsule Take 1 capsule (300 mg total) by mouth 3 (three) times daily. As needed (Patient taking differently: Take 300 mg by mouth 4 (four) times daily. As needed) 270 capsule 1  . glucagon 1 MG injection Inject 1 mg into the muscle once as needed. 1 each 12  . glucose blood (ONETOUCH ULTRA) test strip 1 each by Other route 2 (two) times daily. E11.9 180 strip 0  . Insulin Pen Needle 31G X 8 MM MISC Use as directed     . meclizine (ANTIVERT) 25 MG tablet Take 1 tablet (25 mg total) by mouth 3 (three) times daily as needed for dizziness. 30 tablet 0  . meloxicam (MOBIC) 7.5 MG tablet Take 15 mg by mouth daily.    . Multiple Vitamin (MULTIVITAMIN WITH MINERALS) TABS tablet Take 1 tablet by mouth daily.    Marland Kitchen NOVOLOG 100 UNIT/ML injection INJECT 40 UNITS DAILY PER INSULIN PUMP (MAX DOSAGE) 10 mL 0  . omeprazole (PRILOSEC) 40 MG capsule Take 40 mg by mouth daily before breakfast.  2  . oxyCODONE-acetaminophen (PERCOCET) 10-325 MG tablet SMARTSIG:1 Pill By Mouth 1 to 4 Times Daily PRN    . PARoxetine (PAXIL) 40 MG tablet Take 0.5 tablets (20 mg total) by mouth daily. 90 tablet 1  . rOPINIRole (REQUIP) 1 MG tablet TAKE 1 TABLET (1 MG TOTAL) BY MOUTH AT BEDTIME. 90 tablet 0  . zinc gluconate 50 MG tablet Take 50 mg by mouth daily.     No current facility-administered medications for this visit.   No Known Allergies   Review of Systems: All systems reviewed and negative except where noted in HPI.    CT CARDIAC SCORING  Addendum Date: 09/12/2019   ADDENDUM REPORT: 09/12/2019 08:06 EXAM: OVER-READ INTERPRETATION  CT CHEST The following report is an over-read performed by radiologist Dr. Alvino Blood [facility] on 09/12/2019. This over-read does not include interpretation of cardiac or coronary anatomy or pathology. The coronary calcium score interpretation by the cardiologist is attached. COMPARISON:  CT 04/30/2018 FINDINGS: Limited view of the lung parenchyma  demonstrates no suspicious nodularity. Airways are normal. Limited view of the mediastinum demonstrates no adenopathy. Esophagus normal. Limited view of the upper abdomen unremarkable. Limited view of the skeleton and chest wall is unremarkable. IMPRESSION: No significant extracardiac findings. Electronically Signed   By: Suzy Bouchard M.D.   On: 09/12/2019 08:06   Result Date: 09/12/2019 CLINICAL DATA:  Hx of DM, current smoker with symptoms of dyspnea and angina. Patient was scheduled for CCTA but was too anxious while in the scanner, resulting in very elevated heart rates despite metoprolol 100mg  po, IV  Lopressor 20mg  and 5mg  iv cardizem. CCTA was rescheduled. Only calcium scan was performed. EXAM: Coronary Calcium Score TECHNIQUE: The patient was scanned on a Siemens Go.Top scanner. Axial non-contrast 3 mm slices were carried out through the heart. The data set was analyzed on a dedicated work station and scored using the [facility]. FINDINGS: Non-cardiac: See separate report from [facility]. Ascending Aorta: normal Pericardium: Normal Coronary arteries: Normal origin, no calcifications noted. IMPRESSION: Coronary calcium score of 0. Patient is low risk for near term coronary events Electronically Signed: By: Kate Sable M.D. On: 09/11/2019 16:46   Lab Results  Component Value Date   CREATININE 0.80 09/11/2019   BUN 7 09/18/2018   NA 134 (L) 09/18/2018   K 4.1 09/18/2018   CL 100 09/18/2018   CO2 28 09/18/2018    Lab Results  Component Value Date   ALT 13 06/19/2017   AST 13 06/19/2017   ALKPHOS 69 06/19/2017   BILITOT 0.3 06/19/2017    Lab Results  Component Value Date   WBC 5.9 09/18/2018   HGB 14.9 09/18/2018   HCT 43.4 09/18/2018   MCV 91.3 09/18/2018   PLT 202.0 09/18/2018      Physical Exam: BP 90/68   Pulse (!) 116   Temp 98.4 F (36.9 C)   Ht 5\' 5"  (1.651 m)   Wt 126 lb 8 oz (57.4 kg)   BMI 21.05 kg/m  Constitutional: Pleasant,female in no  acute distress. HEENT: Normocephalic and atraumatic. Conjunctivae are normal. No scleral icterus. Neck supple.  Cardiovascular: Normal rate, regular rhythm.  Pulmonary/chest: Effort normal and breath sounds normal. No wheezing, rales or rhonchi. Abdominal: Soft, nondistended, nontender.  There are no masses palpable.  Extremities: no edema Lymphadenopathy: No cervical adenopathy noted. Neurological: Alert and oriented to person place and time. Skin: Skin is warm and dry. No rashes noted. Psychiatric: Normal mood and affect. Behavior is normal.   ASSESSMENT AND PLAN: 44 year old female here for reassessment of the following issues:  GERD / atypical chest pain / dysphagia - as above, experiencing burning chest discomfort upon waking each morning which is relieved over several minutes.  She has also had dysphagia to both solids and liquids for the past year.  Her typical reflux symptoms are controlled however based on description of her symptoms I am concerned she could be having nocturnal reflux leading to the symptoms.  Discussed options.  We discussed long-term risk benefits of chronic PPI use, her kidney function is normal.  Recommend trial of omeprazole 40 mg twice daily, counseled her to take this about 30 minutes prior to a meal and see if we can get control of her symptoms.  I will also give her some liquid Carafate to take nightly to see if this will help minimize symptoms as well.  Hopefully this can provide some benefit.  Otherwise given her dysphagia and these ongoing symptoms I am recommending an upper endoscopy to further evaluate, unclear if she developed a peptic stricture and may need to perform dilation.  I discussed risks and benefits of EGD and anesthesia and she wanted to proceed.  Further recommendations pending results.  All questions answered  [place] Cellar, MD [facility]

## 2019-10-06 NOTE — Patient Instructions (Addendum)
If you are age 44 or older, your body mass index should be between 23-30. Your Body mass index is 21.05 kg/m. If this is out of the aforementioned range listed, please consider follow up with your Primary Care Provider.  If you are age 21 or younger, your body mass index should be between 19-25. Your Body mass index is 21.05 kg/m. If this is out of the aformentioned range listed, please consider follow up with your Primary Care Provider.    You have been scheduled for an endoscopy. Please follow written instructions given to you at your visit today. If you use inhalers (even only as needed), please bring them with you on the day of your procedure. Your physician has requested that you go to www.startemmi.com and enter the access code given to you at your visit today. This web site gives a general overview about your procedure. However, you should still follow specific instructions given to you by our office regarding your preparation for the procedure.  Increase your omeprazole to TWICE a day.  We have sent the following medications to your pharmacy for you to pick up at your convenience: Liquid Carafate:  Take 10 ml by mouth daily at bedtime.  Thank you for entrusting me with your care and for choosing [facility], Dr. Schneider Cellar     .

## 2019-10-06 NOTE — Telephone Encounter (Signed)
Pt indicated she DID need a refill afterall

## 2019-10-08 ENCOUNTER — Ambulatory Visit (INDEPENDENT_AMBULATORY_CARE_PROVIDER_SITE_OTHER): Payer: 59

## 2019-10-08 ENCOUNTER — Other Ambulatory Visit: Payer: Self-pay

## 2019-10-08 DIAGNOSIS — Z1159 Encounter for screening for other viral diseases: Secondary | ICD-10-CM

## 2019-10-08 LAB — SARS CORONAVIRUS 2 (TAT 6-24 HRS): SARS Coronavirus 2: NEGATIVE

## 2019-10-10 ENCOUNTER — Ambulatory Visit (AMBULATORY_SURGERY_CENTER): Payer: 59 | Admitting: Gastroenterology

## 2019-10-10 ENCOUNTER — Other Ambulatory Visit: Payer: Self-pay | Admitting: Family Medicine

## 2019-10-10 ENCOUNTER — Encounter: Payer: Self-pay | Admitting: Gastroenterology

## 2019-10-10 ENCOUNTER — Other Ambulatory Visit: Payer: Self-pay

## 2019-10-10 VITALS — BP 115/65 | HR 79 | Temp 95.9°F | Resp 18 | Ht 65.0 in | Wt 126.0 lb

## 2019-10-10 DIAGNOSIS — R1310 Dysphagia, unspecified: Secondary | ICD-10-CM

## 2019-10-10 DIAGNOSIS — K219 Gastro-esophageal reflux disease without esophagitis: Secondary | ICD-10-CM | POA: Diagnosis present

## 2019-10-10 DIAGNOSIS — K449 Diaphragmatic hernia without obstruction or gangrene: Secondary | ICD-10-CM

## 2019-10-10 DIAGNOSIS — K228 Other specified diseases of esophagus: Secondary | ICD-10-CM

## 2019-10-10 MED ORDER — METOCLOPRAMIDE HCL 5 MG PO TABS: 5.0000 mg | ORAL_TABLET | Freq: Three times a day (TID) | ORAL | 0 refills | Status: DC

## 2019-10-10 MED ORDER — SODIUM CHLORIDE 0.9 % IV SOLN: 500.0000 mL | Freq: Once | INTRAVENOUS | Status: DC

## 2019-10-10 NOTE — Patient Instructions (Signed)
YOU HAD AN ENDOSCOPIC PROCEDURE TODAY AT THE [facility]:   Refer to the procedure report that was given to you for any specific questions about what was found during the examination.  If the procedure report does not answer your questions, please call your gastroenterologist to clarify.  If you requested that your care partner not be given the details of your procedure findings, then the procedure report has been included in a sealed envelope for you to review at your convenience later.  YOU SHOULD EXPECT: Some feelings of bloating in the abdomen. Passage of more gas than usual.  Walking can help get rid of the air that was put into your GI tract during the procedure and reduce the bloating. If you had a lower endoscopy (such as a colonoscopy or flexible sigmoidoscopy) you may notice spotting of blood in your stool or on the toilet paper. If you underwent a bowel prep for your procedure, you may not have a normal bowel movement for a few days.  Please Note:  You might notice some irritation and congestion in your nose or some drainage.  This is from the oxygen used during your procedure.  There is no need for concern and it should clear up in a day or so.  SYMPTOMS TO REPORT IMMEDIATELY:    Following upper endoscopy (EGD)  Vomiting of blood or coffee ground material  New chest pain or pain under the shoulder blades  Painful or persistently difficult swallowing  New shortness of breath  Fever of 100F or higher  Black, tarry-looking stools  For urgent or emergent issues, a gastroenterologist can be reached at any hour by calling [phone]. Do not use MyChart messaging for urgent concerns.    DIET:  We do recommend a small meal at first, but then you may proceed to your regular diet.  Drink plenty of fluids but you should avoid alcoholic beverages for 24 hours.  ACTIVITY:  You should plan to take it easy for the rest of today and you should NOT DRIVE or use heavy machinery  until tomorrow (because of the sedation medicines used during the test).    FOLLOW UP: Our staff will call the number listed on your records 48-72 hours following your procedure to check on you and address any questions or concerns that you may have regarding the information given to you following your procedure. If we do not reach you, we will leave a message.  We will attempt to reach you two times.  During this call, we will ask if you have developed any symptoms of COVID 19. If you develop any symptoms (ie: fever, flu-like symptoms, shortness of breath, cough etc.) before then, please call [phone].  If you test positive for Covid 19 in the 2 weeks post procedure, please call and report this information to us.    If any biopsies were taken you will be contacted by phone or by letter within the next 1-3 weeks.  Please call us at [phone] if you have not heard about the biopsies in 3 weeks.    SIGNATURES/CONFIDENTIALITY: You and/or your care partner have signed paperwork which will be entered into your electronic medical record.  These signatures attest to the fact that that the information above on your After Visit Summary has been reviewed and is understood.  Full responsibility of the confidentiality of this discharge information lies with you and/or your care-partner. 

## 2019-10-10 NOTE — Progress Notes (Signed)
Report given to PACU, vss 

## 2019-10-10 NOTE — Progress Notes (Signed)
Called to room to assist during endoscopic procedure.  Patient ID and intended procedure confirmed with present staff. Received instructions for my participation in the procedure from the performing physician.  

## 2019-10-10 NOTE — Op Note (Signed)
[place] Patient Name: [name] Procedure Date: 10/10/2019 8:55 AM MRN: [id number] Endoscopist: Remo Lipps P. Havery Moros , MD Age: 44 Referring MD:  Date of Birth: 11-04-1975 Gender: Female Account #: [id number] Procedure:                Upper GI endoscopy Indications:              Dysphagia, Follow-up of gastro-esophageal reflux                            disease - transitioned to omeprazole 40mg  twice                            daily with ? benefit thus far Medicines:                Monitored Anesthesia Care Procedure:                Pre-Anesthesia Assessment:                           - Prior to the procedure, a History and Physical                            was performed, and patient medications and                            allergies were reviewed. The patient's tolerance of                            previous anesthesia was also reviewed. The risks                            and benefits of the procedure and the sedation                            options and risks were discussed with the patient.                            All questions were answered, and informed consent                            was obtained. Prior Anticoagulants: The patient has                            taken no previous anticoagulant or antiplatelet                            agents. ASA Grade Assessment: III - A patient with                            severe systemic disease. After reviewing the risks                            and benefits, the patient was deemed in  satisfactory condition to undergo the procedure.                           After obtaining informed consent, the endoscope was                            passed under direct vision. Throughout the                            procedure, the patient's blood pressure, pulse, and                            oxygen saturations were monitored continuously. The                            Endoscope was  introduced through the mouth, and                            advanced to the second part of duodenum. The upper                            GI endoscopy was accomplished without difficulty.                            The patient tolerated the procedure well. Scope In: Scope Out: Findings:                 Esophagogastric landmarks were identified: the                            Z-line was found at 36 cm, the gastroesophageal                            junction was found at 36 cm and the upper extent of                            the gastric folds was found at 40 cm from the                            incisors.                           A 4 cm hiatal hernia was present.                           The exam of the esophagus was otherwise normal. No                            obvious stenosis / stricture or inflammatory                            changes.                           Biopsies were taken with a  cold forceps in the                            upper third of the esophagus, in the middle third                            of the esophagus and in the lower third of the                            esophagus for histology to rule out eosinophilic                            esophagitis.                           There was some residual food was found in the                            gastric body which prohibited some views of the                            stomach. Empiric dilation of the esophagus not done                            given this finding.                           The exam of the stomach was otherwise normal.                            Pylorus was patent.                           The duodenal bulb and second portion of the                            duodenum were normal. Complications:            No immediate complications. Estimated blood loss:                            Minimal. Estimated Blood Loss:     Estimated blood loss was minimal. Impression:               -  Esophagogastric landmarks identified.                           - 4 cm hiatal hernia.                           - Normal esophagus.No obvious stenosis / stricture,                            empiric dilation not performed given some small  residual food in the stomach. Biopsies taken to                            rule out EoE.                           - A small amount of food (residue) in the stomach.                           - Normal stomach otherwise                           - Normal duodenal bulb and second portion of the                            duodenum.                           Findings concerning for development of                            gastroparesis since last evaluated. No erosive                            esophagitis or candidiasis in regards to her                            symptoms. Recommendation:           - Patient has a contact number available for                            emergencies. The signs and symptoms of potential                            delayed complications were discussed with the                            patient. Return to normal activities tomorrow.                            Written discharge instructions were provided to the                            patient.                           - Resume previous diet.                           - Continue present medications. Continue with                            higher dose omeprazole if that has helped, take                            carafate qHS  in case the patient is having                            nonerosive reflux disease                           - Await pathology results.                           - Recommend empiric trial of Reglan 5mg  TID - will                            discuss with patient, versus formal testing with                            gastric emptying study Remo Lipps P. Havery Moros, MD 10/10/2019 9:25:45 AM This report has been signed  electronically.

## 2019-10-13 ENCOUNTER — Other Ambulatory Visit: Payer: Self-pay

## 2019-10-14 ENCOUNTER — Telehealth: Payer: Self-pay | Admitting: *Deleted

## 2019-10-14 NOTE — Telephone Encounter (Signed)
Second attempt, left VM.  

## 2019-10-14 NOTE — Telephone Encounter (Signed)
First attempt, left VM.  

## 2019-10-20 NOTE — Progress Notes (Signed)
Office Visit Note  Patient: [name]             Date of Birth: 07/05/1976           MRN: [id number]             PCP: Tamsen Roers, MD Referring: Tamsen Roers, MD Visit Date: 10/23/2019 Occupation: '@GUAROCC' @  Subjective:  Pain in multiple joints  History of Present Illness: [name] is a 44 y.o. female seen in consultation per request of Dr. Delilah Shan.  According to patient she has had pain and discomfort in her knee joints for the last 12 years.  She was diagnosed with osteoarthritis in her knees initially at [facility] orth[facility].  Then she has been under care of Dr. Delilah Shan.  She states Dr. Delilah Shan has done x-rays and told her that she needs bilateral total knee replacement.  She states over the last 8 years she has been having progressive pain in all the joints.  She complains of pain and discomfort in her bilateral shoulders.  She states she had left rotator cuff tear which was not repaired.  She continues to have some discomfort in her bilateral wrist joints and her bilateral thumb.  She states her hands get stiff because she has right third trigger finger and left fourth trigger finger.  She was also evaluated by doctors at hand center several years ago and was told that she will require surgery for trigger finger.  She complains of discomfort in her knees and ankles.  She denies any discomfort in her feet.  There is no history of Achilles tendinitis or plantar fasciitis.  She has chronic neck pain and stiffness.  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  She states she has a lot of lower back muscle spasms due to lumbar spine.  She has not been able to work in the last 4 years due to ongoing pain and discomfort.  There is no family history of autoimmune disease.  She is gravida 2 para 2.  Activities of Daily Living:  Patient reports morning stiffness for 3 hours.   Patient Denies nocturnal pain.  Difficulty dressing/grooming:  Denies Difficulty climbing stairs: Reports Difficulty getting out of chair: Reports Difficulty using hands for taps, buttons, cutlery, and/or writing: Reports  Review of Systems  Constitutional: Positive for fatigue. Negative for night sweats, weight gain and weight loss.  HENT: Positive for mouth dryness. Negative for mouth sores, trouble swallowing, trouble swallowing and nose dryness.   Eyes: Negative for pain, redness, visual disturbance and dryness.  Respiratory: Positive for shortness of breath. Negative for cough and difficulty breathing.        History of asthma  Cardiovascular: Negative for chest pain, palpitations, hypertension, irregular heartbeat and swelling in legs/feet.  Gastrointestinal: Negative for blood in stool, constipation and diarrhea.  Endocrine: Positive for cold intolerance, excessive thirst and increased urination.  Genitourinary: Negative for difficulty urinating and vaginal dryness.  Musculoskeletal: Positive for arthralgias, joint pain, joint swelling, muscle weakness, morning stiffness and muscle tenderness. Negative for myalgias and myalgias.  Skin: Positive for color change. Negative for rash, hair loss, skin tightness, ulcers and sensitivity to sunlight.  Allergic/Immunologic: Negative for susceptible to infections.  Neurological: Negative for dizziness, memory loss, night sweats and weakness.  Hematological: Negative for bruising/bleeding tendency and swollen glands.  Psychiatric/Behavioral: Positive for depressed mood and sleep disturbance. The patient is nervous/anxious.     PMFS History:  Patient Active Problem List  Diagnosis Date Noted  . Chronic pain syndrome 11/13/2018  . Dizziness 09/18/2018  . Arthralgia 08/14/2018  . Mild intermittent asthma without complication 40/34/7425  . Snoring 08/12/2018  . Breast lump 08/12/2018  . Lung nodule 08/12/2018  . Chest wall pain 08/02/2018  . Eczema 08/02/2018  . Diabetic peripheral neuropathy ([place])  01/11/2018  . Low back pain with left-sided sciatica 11/29/2017  . Peripheral polyneuropathy 11/29/2017  . Multinodular goiter 07/29/2015  . Type 1 diabetes mellitus with neurological manifestations, uncontrolled ([place]) 01/27/2014  . Restless leg 11/07/2013  . Cholesteatoma 09/11/2011  . Allergy to environmental factors 09/11/2011  . Anxiety 05/01/2011  . Acid reflux 04/01/2010  . Absence of bladder continence 04/01/2010  . INSOMNIA 02/18/2010  . GERD 12/21/2008  . SMOKER 09/25/2007    Past Medical History:  Diagnosis Date  . Anxiety state, unspecified 03/26/2009  . ASTHMA 02/02/2007  . Asthma   . DEPRESSION 02/16/2009  . DIABETES MELLITUS, TYPE I 02/02/2007  . Endometriosis   . GERD 12/21/2008  . Herpes    at 18  . Hyperlipidemia   . Lesion of vocal cord   . Osteoarthritis   . Shortness of breath 09/25/2007  . Vocal fold leukoplakia     Family History  Problem Relation Age of Onset  . Diabetes Mother   . Diabetes Maternal Aunt   . Emphysema Maternal Aunt   . Emphysema Maternal Uncle   . Diabetes Maternal Grandfather   . Lung cancer Maternal Aunt   . Colon cancer Neg Hx   . Kidney disease Neg Hx   . Liver disease Neg Hx   . Esophageal cancer Neg Hx   . Rectal cancer Neg Hx   . Stomach cancer Neg Hx    Past Surgical History:  Procedure Laterality Date  . ABDOMINAL HYSTERECTOMY  2009   BSO  . DIRECT LARYNGOSCOPY N/A 08/17/2017   Procedure: DIRECT LARYNGOSCOPY WITH OPERATING TELESCOPE WITH BIOPSY;  Surgeon: Helayne Seminole, MD;  Location: [facility];  Service: ENT;  Laterality: N/A;  . NASOPHARYNGEAL BIOPSY Right 08/17/2017   Procedure: NASOPHARYNGEAL BIOPSY RIGHT NASAL LESION;  Surgeon: Helayne Seminole, MD;  Location: [facility];  Service: ENT;  Laterality: Right;   Social History   Social History Narrative   2 children--pt has custody. Husband pays child support.   Daily Caffeine Use-3 2 liters a day   Pt does not get regular exercise.   Immunization History    Administered Date(s) Administered  . Influenza Split 04/11/2012  . Influenza,inj,Quad PF,6+ Mos 08/02/2018  . Td 07/19/1999     Objective: Vital Signs: BP 120/61 (BP Location: Right Arm, Patient Position: Sitting, Cuff Size: Normal)   Pulse 90   Resp 14   Ht '5\' 5"'  (1.651 m)   Wt 134 lb (60.8 kg)   BMI 22.30 kg/m    Physical Exam Vitals and nursing note reviewed.  Constitutional:      Appearance: She is well-developed.  HENT:     Head: Normocephalic and atraumatic.  Eyes:     Conjunctiva/sclera: Conjunctivae normal.  Cardiovascular:     Rate and Rhythm: Normal rate and regular rhythm.     Heart sounds: Normal heart sounds.  Pulmonary:     Effort: Pulmonary effort is normal.     Breath sounds: Normal breath sounds.  Abdominal:     General: Bowel sounds are normal.     Palpations: Abdomen is soft.  Musculoskeletal:     Cervical back: Normal range of motion.  Lymphadenopathy:     Cervical: No cervical adenopathy.  Skin:    General: Skin is warm and dry.     Capillary Refill: Capillary refill takes less than 2 seconds.  Neurological:     Mental Status: She is alert and oriented to person, place, and time.  Psychiatric:        Behavior: Behavior normal.      Musculoskeletal Exam: Patient is discomfort with range of motion of her cervical and lumbar spine.  No SI joint tenderness was noted.  She had painful range of motion of bilateral shoulder joints although they were in full range of motion.  Elbow joints and wrist joints with good range of motion with no synovitis.  She has some tenderness over bilateral CMC joints.  No MCP PIP or DIP synovitis was noted.  She had good range of motion of bilateral hip joints.  She has good range of motion of bilateral knee joints with discomfort.  No warmth swelling or effusion was noted.  She has tenderness over MTPs and ankle joints.  No synovitis was noted.  CDAI Exam: CDAI Score: -- Patient Global: --; Provider Global:  -- Swollen: --; Tender: -- Joint Exam 10/23/2019   No joint exam has been documented for this visit   There is currently no information documented on the homunculus. Go to the Rheumatology activity and complete the homunculus joint exam.  Investigation: No additional findings.  Imaging: No results found.  Recent Labs: Lab Results  Component Value Date   WBC 5.9 09/18/2018   HGB 14.9 09/18/2018   PLT 202.0 09/18/2018   NA 134 (L) 09/18/2018   K 4.1 09/18/2018   CL 100 09/18/2018   CO2 28 09/18/2018   GLUCOSE 225 (H) 09/18/2018   BUN 7 09/18/2018   CREATININE 0.80 09/11/2019   BILITOT 0.3 06/19/2017   ALKPHOS 69 06/19/2017   AST 13 06/19/2017   ALT 13 06/19/2017   PROT 6.6 11/29/2017   ALBUMIN 4.5 06/19/2017   CALCIUM 9.4 09/18/2018   GFRAA >60 05/31/2018    Speciality Comments: No specialty comments available.  Procedures:  No procedures performed Allergies: Patient has no known allergies.   Assessment / Plan:     Visit Diagnoses: Polyarthralgia -planes of pain and discomfort in multiple joints for over 12 years.  Her autoimmune work-up in the past has been negative.  04/08/18: RF<14, anti-CCP<16, CRP<0.3,ANA-, CBC WNL, Vit D 32, Vit B12 590, TSH 0.65, T4 1.1, Homocysteine 5.9, thyroid peroxidase ab 2, thyroglobulin ab-   Chronic pain of both shoulders-patient gives history of pain and discomfort in her bilateral shoulders.  She states she was diagnosed with rotator cuff tear in the past.  She never had surgery.  Pain in both hands -she complains of pain and discomfort in her bilateral hands with intermittent swelling.  She states the pain and discomfort mostly has been in her CMC joints and her wrist joints.  No synovitis was noted on examination today.  Plan: XR Hand 2 View Right, XR Hand 2 View Left, x-ray of bilateral hands showed mild osteoarthritic changes.  Sedimentation rate, Rheumatoid factor, Cyclic citrul peptide antibody, IgG, 14-3-3 eta Protein, ANA  Right  middle trigger finger and left ring trigger finger-patient has been seen by hand surgeon in the past.  She states she was advised surgery.  She is off-and-on symptoms with the fingers triggering.  Chronic pain of both knees-she has pain and discomfort in her bilateral knee joints.  She states she has  had x-ray of her bilateral knee joints which were consistent with end-stage osteoarthritis and she will require total knee replacement.  Pain in both feet -she complains of pain and discomfort in her bilateral feet and ankles.  No synovitis was noted.  Plan: XR Foot 2 Views Right, XR Foot 2 Views Left.  X-ray of bilateral feet were consistent with osteoarthritis.  Chronic midline low back pain without sciatica -patient states she has had chronic back pain for which she is seen Dr. Delilah Shan in the past.  She also has some neck discomfort.  I do not have any x-rays to review today.  Plan: HLA-B27 antigen  Other medical problems are listed as follows:  Chronic pain syndrome  Type 1 diabetes mellitus with neurological manifestations, uncontrolled ([facility])  Peripheral polyneuropathy  History of hyperlipidemia  History of gastroesophageal reflux (GERD)  Lung nodule  Smoker-smoking cessation was discussed.  Association of smoking with rheumatoid arthritis was emphasized.  Mild intermittent asthma without complication  Eczema, unspecified type  Multinodular goiter  Orders: Orders Placed This Encounter  Procedures  . XR Hand 2 View Right  . XR Hand 2 View Left  . XR Foot 2 Views Right  . XR Foot 2 Views Left  . Sedimentation rate  . Rheumatoid factor  . Cyclic citrul peptide antibody, IgG  . 14-3-3 eta Protein  . ANA  . HLA-B27 antigen   No orders of the defined types were placed in this encounter.   Face-to-face time spent with patient was 50 minutes. Greater than 50% of time was spent in counseling and coordination of care.  Follow-Up Instructions: Return for  Osteoarthritis.   Bo Merino, MD  Note - This record has been created using Editor, commissioning.  Chart creation errors have been sought, but may not always  have been located. Such creation errors do not reflect on  the standard of medical care.

## 2019-10-23 ENCOUNTER — Ambulatory Visit: Payer: Self-pay

## 2019-10-23 ENCOUNTER — Ambulatory Visit (INDEPENDENT_AMBULATORY_CARE_PROVIDER_SITE_OTHER): Payer: 59 | Admitting: Rheumatology

## 2019-10-23 ENCOUNTER — Other Ambulatory Visit: Payer: Self-pay

## 2019-10-23 ENCOUNTER — Encounter: Payer: Self-pay | Admitting: Rheumatology

## 2019-10-23 VITALS — BP 120/61 | HR 90 | Resp 14 | Ht 65.0 in | Wt 134.0 lb

## 2019-10-23 DIAGNOSIS — M545 Low back pain, unspecified: Secondary | ICD-10-CM

## 2019-10-23 DIAGNOSIS — M79642 Pain in left hand: Secondary | ICD-10-CM | POA: Diagnosis not present

## 2019-10-23 DIAGNOSIS — M2550 Pain in unspecified joint: Secondary | ICD-10-CM

## 2019-10-23 DIAGNOSIS — G8929 Other chronic pain: Secondary | ICD-10-CM

## 2019-10-23 DIAGNOSIS — M25511 Pain in right shoulder: Secondary | ICD-10-CM

## 2019-10-23 DIAGNOSIS — R911 Solitary pulmonary nodule: Secondary | ICD-10-CM

## 2019-10-23 DIAGNOSIS — M25561 Pain in right knee: Secondary | ICD-10-CM

## 2019-10-23 DIAGNOSIS — Z8639 Personal history of other endocrine, nutritional and metabolic disease: Secondary | ICD-10-CM

## 2019-10-23 DIAGNOSIS — M79641 Pain in right hand: Secondary | ICD-10-CM

## 2019-10-23 DIAGNOSIS — E1049 Type 1 diabetes mellitus with other diabetic neurological complication: Secondary | ICD-10-CM

## 2019-10-23 DIAGNOSIS — M79671 Pain in right foot: Secondary | ICD-10-CM | POA: Diagnosis not present

## 2019-10-23 DIAGNOSIS — Z8719 Personal history of other diseases of the digestive system: Secondary | ICD-10-CM

## 2019-10-23 DIAGNOSIS — M25562 Pain in left knee: Secondary | ICD-10-CM

## 2019-10-23 DIAGNOSIS — G629 Polyneuropathy, unspecified: Secondary | ICD-10-CM

## 2019-10-23 DIAGNOSIS — G894 Chronic pain syndrome: Secondary | ICD-10-CM

## 2019-10-23 DIAGNOSIS — E042 Nontoxic multinodular goiter: Secondary | ICD-10-CM

## 2019-10-23 DIAGNOSIS — M79672 Pain in left foot: Secondary | ICD-10-CM | POA: Diagnosis not present

## 2019-10-23 DIAGNOSIS — M25512 Pain in left shoulder: Secondary | ICD-10-CM

## 2019-10-23 DIAGNOSIS — F17200 Nicotine dependence, unspecified, uncomplicated: Secondary | ICD-10-CM

## 2019-10-23 DIAGNOSIS — IMO0002 Reserved for concepts with insufficient information to code with codable children: Secondary | ICD-10-CM

## 2019-10-23 DIAGNOSIS — M65342 Trigger finger, left ring finger: Secondary | ICD-10-CM

## 2019-10-23 DIAGNOSIS — M65331 Trigger finger, right middle finger: Secondary | ICD-10-CM

## 2019-10-23 DIAGNOSIS — J4520 Mild intermittent asthma, uncomplicated: Secondary | ICD-10-CM

## 2019-10-23 DIAGNOSIS — L309 Dermatitis, unspecified: Secondary | ICD-10-CM

## 2019-10-23 DIAGNOSIS — E1065 Type 1 diabetes mellitus with hyperglycemia: Secondary | ICD-10-CM

## 2019-10-28 ENCOUNTER — Other Ambulatory Visit: Payer: Self-pay | Admitting: Endocrinology

## 2019-10-28 DIAGNOSIS — E1049 Type 1 diabetes mellitus with other diabetic neurological complication: Secondary | ICD-10-CM

## 2019-10-28 DIAGNOSIS — IMO0002 Reserved for concepts with insufficient information to code with codable children: Secondary | ICD-10-CM

## 2019-10-29 ENCOUNTER — Telehealth: Payer: Self-pay

## 2019-10-29 ENCOUNTER — Other Ambulatory Visit: Payer: 59

## 2019-10-29 LAB — RHEUMATOID FACTOR: Rheumatoid fact SerPl-aCnc: <14 IU/mL (ref <14)

## 2019-10-29 LAB — ANA: Anti Nuclear Antibody (ANA): POSITIVE — AB

## 2019-10-29 LAB — 14-3-3 ETA PROTEIN: 14-3-3 eta Protein: <0.2 ng/mL (ref <0.2)

## 2019-10-29 LAB — SEDIMENTATION RATE: Sed Rate: 11 mm/h (ref 0–20)

## 2019-10-29 LAB — ANTI-NUCLEAR AB-TITER (ANA TITER)
ANA TITER: 1:40 {titer} — ABNORMAL HIGH
ANA Titer 1: 1:40 {titer} — ABNORMAL HIGH

## 2019-10-29 LAB — HLA-B27 ANTIGEN: HLA-B27 Antigen: NEGATIVE

## 2019-10-29 LAB — CYCLIC CITRUL PEPTIDE ANTIBODY, IGG: Cyclic Citrullin Peptide Ab: <16 UNITS

## 2019-10-29 NOTE — Telephone Encounter (Signed)
FAXED [place]: [employer]  Document: CMN for [employer] pump and supplies Other records requested: Last 3 months of office notes, recent C-Peptide (2017) and fasting glucose (2017) - BOTH C-Peptide and Fasting Glucose indicate pt WAS fasting, are BOTH dated and BOTH have been signed by Dr. [name] with his name printed as well.  NOTE - If [employer] requires a more current C-Peptide and Fasting Glucose, pt will need to be scheduled for a lab appt to have labs obtained  All above requested information has been faxed successfully to the Company listed above. Documents and fax confirmation have been placed in the faxed file for future reference.

## 2019-10-30 ENCOUNTER — Telehealth: Payer: Self-pay | Admitting: Dietician

## 2019-10-30 NOTE — Telephone Encounter (Signed)
Patient called and left 2 messages on our office phone. Her old pump just quit working and she is inquiring about the status of her new pump.   Chart reviewed, lab work and other documentation sent by Dr. Cordelia Pen office to [employer].  Left message on Karianne's cell noting the above.  Antonieta Iba, RD, LDN, CDE

## 2019-10-30 NOTE — Progress Notes (Signed)
I will discuss results at the follow-up visit.

## 2019-10-31 ENCOUNTER — Telehealth: Payer: Self-pay | Admitting: Endocrinology

## 2019-10-31 ENCOUNTER — Ambulatory Visit: Payer: 59 | Admitting: Internal Medicine

## 2019-10-31 NOTE — Telephone Encounter (Signed)
please contact patient: I got form for continuous glucose monitor.  Why don't you see [name], to start the new pump and continuous glucose monitor?  OK with you?

## 2019-10-31 NOTE — Telephone Encounter (Signed)
Called pt and left a detailed VM informing her to call the office to schedule an appt with Vaughan Basta for education re: pump and CGM.  Vaughan Basta - please continue efforts to reach pt to schedule appt with you. Forms are still being held by Dr. [name] for his completion.

## 2019-11-03 ENCOUNTER — Telehealth: Payer: Self-pay | Admitting: Rheumatology

## 2019-11-03 ENCOUNTER — Telehealth: Payer: Self-pay

## 2019-11-03 NOTE — Telephone Encounter (Signed)
Patient calling to see about making appointment with Vaughan Basta, she said she had a VM left for her on Friday.

## 2019-11-03 NOTE — Telephone Encounter (Signed)
LMOM for patient to call and schedule NPT FU appointment with Dr. Estanislado Pandy.

## 2019-11-03 NOTE — Telephone Encounter (Signed)
-----   Message from Shona Needles, RT sent at 11/03/2019  8:26 AM EDT ----- Regarding: NP F/U APPT - PLEASE SCHEDULE

## 2019-11-04 ENCOUNTER — Ambulatory Visit (INDEPENDENT_AMBULATORY_CARE_PROVIDER_SITE_OTHER): Payer: 59 | Admitting: Ophthalmology

## 2019-11-04 ENCOUNTER — Encounter (INDEPENDENT_AMBULATORY_CARE_PROVIDER_SITE_OTHER): Payer: Self-pay | Admitting: Ophthalmology

## 2019-11-04 ENCOUNTER — Other Ambulatory Visit: Payer: Self-pay

## 2019-11-04 DIAGNOSIS — H3561 Retinal hemorrhage, right eye: Secondary | ICD-10-CM

## 2019-11-04 DIAGNOSIS — E103491 Type 1 diabetes mellitus with severe nonproliferative diabetic retinopathy without macular edema, right eye: Secondary | ICD-10-CM | POA: Diagnosis not present

## 2019-11-04 DIAGNOSIS — E103492 Type 1 diabetes mellitus with severe nonproliferative diabetic retinopathy without macular edema, left eye: Secondary | ICD-10-CM

## 2019-11-04 HISTORY — DX: Retinal hemorrhage, right eye: H35.61

## 2019-11-04 HISTORY — DX: Type 1 diabetes mellitus with severe nonproliferative diabetic retinopathy without macular edema, right eye: E10.3491

## 2019-11-04 NOTE — Telephone Encounter (Signed)
Called patient again at 10:45AM to try to schedule pump training, but not able to leave a voice mail.   11;00am:  PT. Called saying she does not have her pump, but was needing to come in for blood work.  She was told that her blood work was faxed to [employer] again yesterday, and to call the [employer] rep. To see if he received it, and we can cancel her appointment tomorrow.  She agreed to do this.

## 2019-11-04 NOTE — Patient Instructions (Signed)
The nature of severe nonproliferative diabetic retinopathy discussed with the patient as well as the need for more frequent follow up and likely progression to proliferative disease in the near future. The options of continued observation versus panretinal photocoagulation at this time were reviewed as well as the risks, benefits, and alternatives. More recent option includes the use of ocular injectable medications to slow progression of retinal disease. Tight control of glucose, blood pressure, and serum lipid levels were recommended under the direction of general physician or endocrinologist, as well as avoidance of smoking and maintenance of normal body weight. The 2-year risk of progression to proliferative diabetic retinopathy is 60%.  OU with mid peripheral and anterior retinal nonperfusion however no evidence of neovascularization thus will observe yet closely

## 2019-11-04 NOTE — Progress Notes (Signed)
11/04/2019     CHIEF COMPLAINT Patient presents for Diabetic Eye Exam   HISTORY OF PRESENT ILLNESS: [name] is a 44 y.o. female who presents to the clinic today for:   HPI    Diabetic Eye Exam    Vision is stable.  Associated Symptoms Negative for Flashes and Floaters.  Diabetes characteristics include Type 1.  Blood sugar level fluctuates.  Last Blood Glucose 197.  Last A1C 9.2.  I, the attending physician,  performed the HPI with the patient and updated documentation appropriately.          Comments    5 Month Diabetic Exam OU. FP. PRP OD  Pt states no changes in eyes or vision. Denies FOL and floaters. Pt states her pump is not working correctly so BGL are fluctuating.       Last edited by Tilda Franco on 11/04/2019  1:20 PM. (History)      Referring physician: Tamsen Roers, MD [place],  [place] [address]  HISTORICAL INFORMATION:   Selected notes from the MEDICAL RECORD NUMBER    Lab Results  Component Value Date   HGBA1C 9.3 (A) 09/15/2019     CURRENT MEDICATIONS: No current outpatient medications on file. (Ophthalmic Drugs)   No current facility-administered medications for this visit. (Ophthalmic Drugs)   Current Outpatient Medications (Other)  Medication Sig  . albuterol (PROVENTIL HFA;VENTOLIN HFA) 108 (90 Base) MCG/ACT inhaler Inhale 2 puffs into the lungs every 6 (six) hours as needed for wheezing or shortness of breath.  . ALPRAZolam (XANAX) 1 MG tablet Take 1 mg by mouth 4 (four) times daily.  Marland Kitchen atorvastatin (LIPITOR) 10 MG tablet Take 1 tablet (10 mg total) by mouth at bedtime.  . B-D UF III MINI PEN NEEDLES 31G X 5 MM MISC 4 (four) times daily.  . budesonide-formoterol (SYMBICORT) 80-4.5 MCG/ACT inhaler Inhale 2 puffs into the lungs 2 (two) times daily.  . Cholecalciferol (VITAMIN D3) 50 MCG (2000 UT) capsule Take 1,000 Units by mouth daily.   . cyclobenzaprine (FLEXERIL) 10 MG tablet Take 10 mg by mouth 3 (three) times  daily as needed.  Marland Kitchen escitalopram (LEXAPRO) 20 MG tablet Take 20 mg by mouth daily.  Marland Kitchen gabapentin (NEURONTIN) 300 MG capsule Take 1 capsule (300 mg total) by mouth 3 (three) times daily. As needed (Patient taking differently: Take 300 mg by mouth 4 (four) times daily. As needed)  . glucagon 1 MG injection Inject 1 mg into the muscle once as needed.  Marland Kitchen glucose blood (ONETOUCH ULTRA) test strip 1 each by Other route 2 (two) times daily. E11.9  . HUMALOG KWIKPEN 100 UNIT/ML KwikPen SMARTSIG:5 Unit(s) SUB-Q 3 Times Daily  . Insulin Human (INSULIN PUMP) SOLN Inject into the skin. As directed  . LANTUS SOLOSTAR 100 UNIT/ML Solostar Pen SMARTSIG:15 Unit(s) SUB-Q Daily  . meclizine (ANTIVERT) 25 MG tablet Take 1 tablet (25 mg total) by mouth 3 (three) times daily as needed for dizziness.  . meloxicam (MOBIC) 7.5 MG tablet Take 15 mg by mouth daily.  . metoCLOPramide (REGLAN) 5 MG tablet Take 1 tablet (5 mg total) by mouth 3 (three) times daily before meals.  . Multiple Vitamin (MULTIVITAMIN WITH MINERALS) TABS tablet Take 1 tablet by mouth daily.  Marland Kitchen NOVOLOG 100 UNIT/ML injection INJECT 40 UNITS DAILY PER INSULIN PUMP (MAX DOSAGE)  . omeprazole (PRILOSEC) 40 MG capsule Take 1 capsule (40 mg total) by mouth in the morning and at bedtime.  Marland Kitchen  oxyCODONE-acetaminophen (PERCOCET) 10-325 MG tablet SMARTSIG:1 Pill By Mouth 1 to 4 Times Daily PRN  . PARoxetine (PAXIL) 20 MG tablet   . rOPINIRole (REQUIP) 1 MG tablet Take 1qhs (Plz sched appt with new provider)  . sucralfate (CARAFATE) 1 GM/10ML suspension Take 10 mLs (1 g total) by mouth at bedtime.  Marland Kitchen zinc gluconate 50 MG tablet Take 50 mg by mouth daily.   No current facility-administered medications for this visit. (Other)      REVIEW OF SYSTEMS:    ALLERGIES No Known Allergies  PAST MEDICAL HISTORY Past Medical History:  Diagnosis Date  . Anxiety state, unspecified 03/26/2009  . ASTHMA 02/02/2007  . Asthma   . DEPRESSION 02/16/2009  . DIABETES  MELLITUS, TYPE I 02/02/2007  . Endometriosis   . GERD 12/21/2008  . Herpes    at 18  . Hyperlipidemia   . Lesion of vocal cord   . Osteoarthritis   . Shortness of breath 09/25/2007  . Vocal fold leukoplakia    Past Surgical History:  Procedure Laterality Date  . ABDOMINAL HYSTERECTOMY  2009   BSO  . DIRECT LARYNGOSCOPY N/A 08/17/2017   Procedure: DIRECT LARYNGOSCOPY WITH OPERATING TELESCOPE WITH BIOPSY;  Surgeon: Helayne Seminole, MD;  Location: [facility];  Service: ENT;  Laterality: N/A;  . NASOPHARYNGEAL BIOPSY Right 08/17/2017   Procedure: NASOPHARYNGEAL BIOPSY RIGHT NASAL LESION;  Surgeon: Helayne Seminole, MD;  Location: [facility];  Service: ENT;  Laterality: Right;    FAMILY HISTORY Family History  Problem Relation Age of Onset  . Diabetes Mother   . Diabetes Maternal Aunt   . Emphysema Maternal Aunt   . Emphysema Maternal Uncle   . Diabetes Maternal Grandfather   . Lung cancer Maternal Aunt   . Colon cancer Neg Hx   . Kidney disease Neg Hx   . Liver disease Neg Hx   . Esophageal cancer Neg Hx   . Rectal cancer Neg Hx   . Stomach cancer Neg Hx     SOCIAL HISTORY Social History   Tobacco Use  . Smoking status: Current Every Day Smoker    Packs/day: 1.50    Years: 25.00    Pack years: 37.50    Types: Cigarettes  . Smokeless tobacco: Never Used  . Tobacco comment: 1PPD as of 07/2018  Substance Use Topics  . Alcohol use: Not Currently    Alcohol/week: 0.0 standard drinks  . Drug use: No         OPHTHALMIC EXAM:  Base Eye Exam    Visual Acuity (Snellen - Linear)      Right Left   Dist cc 20/20 20/20 -2   Correction: Glasses       Tonometry (Tonopen, 1:26 PM)      Right Left   Pressure 12 13       Pupils      Pupils Dark Light Shape React APD   Right PERRL 4.5 3 Round Brisk None   Left PERRL 4.5 3 Round Brisk None       Visual Fields (Counting fingers)      Left Right    Full Full       Neuro/Psych    Oriented x3: Yes   Mood/Affect: Normal         Dilation    Both eyes: 1.0% Mydriacyl, 2.5% Phenylephrine @ 1:26 PM        Slit Lamp and Fundus Exam    External Exam      Right  Left   External Normal Normal       Slit Lamp Exam      Right Left   Lids/Lashes Normal Normal   Conjunctiva/Sclera White and quiet White and quiet   Cornea Clear Clear   Anterior Chamber Deep and quiet Deep and quiet   Iris Round and reactive Round and reactive   Lens 1+ Nuclear sclerosis 1+ Nuclear sclerosis   Anterior Vitreous Normal Normal       Fundus Exam      Right Left   Posterior Vitreous Normal Normal   Disc Normal Normal   C/D Ratio 0.3 0.2   Macula Microaneurysms, no macular thickening, no exudates Microaneurysms, no macular thickening, no exudates   Vessels Severe nonproliferative diabetic retinopathy, peripheral nonperfusion Severe nonproliferative diabetic retinopathy, peripheral nonperfusion   Periphery No retinal holes or tears, mid peripheral and anterior dot blot hemorrhages of nonperfusion No retinal holes or tears, mid peripheral and anterior dot blot hemorrhages of nonperfusion          IMAGING AND PROCEDURES  Imaging and Procedures for @TODAY @  Color Fundus Photography Optos - OU - Both Eyes       Right Eye Progression has been stable. Disc findings include normal observations. Macula : microaneurysms.   Left Eye Progression has been stable. Disc findings include normal observations. Macula : microaneurysms.   Notes OU with severe nonproliferative diabetic retinopathy largely anterior to the equator.  Some microaneurysms are present in the macula.  There is no clinically significant macular edema.  There is no need for intervention therapeutically OU at this time                ASSESSMENT/PLAN:  No problem-specific Assessment & Plan notes found for this encounter.      ICD-10-CM   1. Severe nonproliferative diabetic retinopathy of right eye, without macular edema, associated with type 1  diabetes mellitus (HCC)  [id number] Color Fundus Photography Optos - OU - Both Eyes  2. Severe nonproliferative diabetic retinopathy of left eye, without macular edema, associated with type 1 diabetes mellitus (HCC)  [id number] Color Fundus Photography Optos - OU - Both Eyes  3. Retinal hemorrhage of right eye  H35.61     1.  Observe the retinal periphery closely looking for signs of neovascularization  2. Stable OU today 3.  Ophthalmic Meds Ordered this visit:  No orders of the defined types were placed in this encounter.      No follow-ups on file.  Patient Instructions  The nature of severe nonproliferative diabetic retinopathy discussed with the patient as well as the need for more frequent follow up and likely progression to proliferative disease in the near future. The options of continued observation versus panretinal photocoagulation at this time were reviewed as well as the risks, benefits, and alternatives. More recent option includes the use of ocular injectable medications to slow progression of retinal disease. Tight control of glucose, blood pressure, and serum lipid levels were recommended under the direction of general physician or endocrinologist, as well as avoidance of smoking and maintenance of normal body weight. The 2-year risk of progression to proliferative diabetic retinopathy is 60%.  OU with mid peripheral and anterior retinal nonperfusion however no evidence of neovascularization thus will observe yet closely    Explained the diagnoses, plan, and follow up with the patient and they expressed understanding.  Patient expressed understanding of the importance of proper follow up care.   Clent Demark Yoshie Kosel M.D. Diseases & Surgery of  the Retina and Vitreous Retina & Diabetic [place] @TODAY @     Abbreviations: M myopia (nearsighted); A astigmatism; H hyperopia (farsighted); P presbyopia; Mrx spectacle prescription;  CTL contact lenses; OD right eye; OS left eye;  OU both eyes  XT exotropia; ET esotropia; PEK punctate epithelial keratitis; PEE punctate epithelial erosions; DES dry eye syndrome; MGD meibomian gland dysfunction; ATs artificial tears; PFAT's preservative free artificial tears; [place] nuclear sclerotic cataract; PSC posterior subcapsular cataract; ERM epi-retinal membrane; PVD posterior vitreous detachment; RD retinal detachment; DM diabetes mellitus; DR diabetic retinopathy; NPDR non-proliferative diabetic retinopathy; PDR proliferative diabetic retinopathy; CSME clinically significant macular edema; DME diabetic macular edema; dbh dot blot hemorrhages; CWS cotton wool spot; POAG primary open angle glaucoma; C/D cup-to-disc ratio; HVF humphrey visual field; GVF goldmann visual field; OCT optical coherence tomography; IOP intraocular pressure; BRVO Branch retinal vein occlusion; CRVO central retinal vein occlusion; CRAO central retinal artery occlusion; BRAO branch retinal artery occlusion; RT retinal tear; SB scleral buckle; PPV pars plana vitrectomy; VH Vitreous hemorrhage; PRP panretinal laser photocoagulation; IVK intravitreal kenalog; VMT vitreomacular traction; MH Macular hole;  NVD neovascularization of the disc; NVE neovascularization elsewhere; AREDS age related eye disease study; ARMD age related macular degeneration; POAG primary open angle glaucoma; EBMD epithelial/anterior basement membrane dystrophy; ACIOL anterior chamber intraocular lens; IOL intraocular lens; PCIOL posterior chamber intraocular lens; Phaco/IOL phacoemulsification with intraocular lens placement; [place] photorefractive keratectomy; LASIK laser assisted in situ keratomileusis; HTN hypertension; DM diabetes mellitus; COPD chronic obstructive pulmonary disease

## 2019-11-04 NOTE — Telephone Encounter (Signed)
Not able to leave a voice mail to schedule appointment for pump start X2

## 2019-11-05 ENCOUNTER — Other Ambulatory Visit: Payer: 59

## 2019-11-11 ENCOUNTER — Other Ambulatory Visit: Payer: Self-pay | Admitting: Endocrinology

## 2019-11-11 DIAGNOSIS — IMO0002 Reserved for concepts with insufficient information to code with codable children: Secondary | ICD-10-CM

## 2019-11-11 DIAGNOSIS — E1049 Type 1 diabetes mellitus with other diabetic neurological complication: Secondary | ICD-10-CM

## 2019-11-18 ENCOUNTER — Other Ambulatory Visit: Payer: Self-pay

## 2019-11-18 ENCOUNTER — Telehealth: Payer: Self-pay | Admitting: Endocrinology

## 2019-11-18 DIAGNOSIS — IMO0002 Reserved for concepts with insufficient information to code with codable children: Secondary | ICD-10-CM

## 2019-11-18 DIAGNOSIS — E1049 Type 1 diabetes mellitus with other diabetic neurological complication: Secondary | ICD-10-CM

## 2019-11-18 MED ORDER — ACCU-CHEK GUIDE ME W/DEVICE KIT: 1.0000 | PACK | Freq: Four times a day (QID) | 0 refills | Status: DC

## 2019-11-18 MED ORDER — ACCU-CHEK GUIDE VI STRP: 1.0000 | ORAL_STRIP | Freq: Four times a day (QID) | 0 refills | Status: DC

## 2019-11-18 MED ORDER — ACCU-CHEK SOFTCLIX LANCETS MISC: 1.0000 | Freq: Four times a day (QID) | 0 refills | Status: DC

## 2019-11-18 NOTE — Telephone Encounter (Signed)
Patient called saying she has heard back from someone about her pump that was being ordered, and they have "still not received paperwork requested from Dr. Cordelia Pen office.  She was not certain if she was talking to the [employer] people, or her distributor that she was getting the pump from.  I phoned the [employer] representative, and left a message to call me about the status of her pump.

## 2019-11-18 NOTE — Telephone Encounter (Signed)
Outpatient Medication Detail   Disp Refills Start End   Accu-Chek Softclix Lancets lancets 360 each 0 11/18/2019    Sig - Route: 1 each by Other route 4 (four) times daily. E11.9 - Other   Sent to pharmacy as: Accu-Chek Softclix Lancets lancets   E-Prescribing Status: Receipt confirmed by pharmacy (11/18/2019  2:04 PM EDT)    Outpatient Medication Detail   Disp Refills Start End   Blood Glucose Monitoring Suppl (ACCU-CHEK GUIDE ME) w/Device KIT 1 kit 0 11/18/2019    Sig - Route: 1 each by Does not apply route 4 (four) times daily. E11.9 - Does not apply   Sent to pharmacy as: Blood Glucose Monitoring Suppl (ACCU-CHEK GUIDE ME) w/Device Kit   E-Prescribing Status: Receipt confirmed by pharmacy (11/18/2019  2:04 PM EDT)    Outpatient Medication Detail   Disp Refills Start End   glucose blood (ACCU-CHEK GUIDE) test strip 360 each 0 11/18/2019    Sig - Route: 1 each by Other route 4 (four) times daily. E11.9 - Other   Sent to pharmacy as: glucose blood (ACCU-CHEK GUIDE) test strip   E-Prescribing Status: Receipt confirmed by pharmacy (11/18/2019  2:04 PM EDT)

## 2019-11-18 NOTE — Telephone Encounter (Signed)
Patient states her insurance does not cover One touch and will cover accu chek - patient requests rx for accu chek (machine and strips) to be called in for her.  Pharmacy -   [id number] - [place], [address] Phone:  [phone]  Fax:  [phone]

## 2019-11-24 ENCOUNTER — Telehealth: Payer: Self-pay

## 2019-11-24 NOTE — Telephone Encounter (Signed)
Unable to complete CMN, Rx and [place] PA for CGM without an appt. LVM requesting returned call.

## 2019-11-30 DIAGNOSIS — M19072 Primary osteoarthritis, left ankle and foot: Secondary | ICD-10-CM | POA: Insufficient documentation

## 2019-11-30 DIAGNOSIS — M19041 Primary osteoarthritis, right hand: Secondary | ICD-10-CM

## 2019-11-30 DIAGNOSIS — M170 Bilateral primary osteoarthritis of knee: Secondary | ICD-10-CM | POA: Insufficient documentation

## 2019-11-30 DIAGNOSIS — M19071 Primary osteoarthritis, right ankle and foot: Secondary | ICD-10-CM

## 2019-11-30 HISTORY — DX: Primary osteoarthritis, right hand: M19.041

## 2019-11-30 HISTORY — DX: Primary osteoarthritis, left ankle and foot: M19.071

## 2019-11-30 NOTE — Progress Notes (Deleted)
Office Visit Note  Patient: [name]             Date of Birth: 15-Oct-1975           MRN: [id number]             PCP: Tamsen Roers, MD Referring: Tamsen Roers, MD Visit Date: 12/04/2019 Occupation: '@GUAROCC' @  Subjective:  No chief complaint on file.   History of Present Illness: [name] is a 44 y.o. female ***   Activities of Daily Living:  Patient reports morning stiffness for *** {minute/hour:19697}.   Patient {ACTIONS;DENIES/REPORTS:21021675::"Denies"} nocturnal pain.  Difficulty dressing/grooming: {ACTIONS;DENIES/REPORTS:21021675::"Denies"} Difficulty climbing stairs: {ACTIONS;DENIES/REPORTS:21021675::"Denies"} Difficulty getting out of chair: {ACTIONS;DENIES/REPORTS:21021675::"Denies"} Difficulty using hands for taps, buttons, cutlery, and/or writing: {ACTIONS;DENIES/REPORTS:21021675::"Denies"}  No Rheumatology ROS completed.   PMFS History:  Patient Active Problem List   Diagnosis Date Noted  . Severe nonproliferative diabetic retinopathy of right eye, without macular edema, associated with type 1 diabetes mellitus ([place]) 11/04/2019  . Severe nonproliferative diabetic retinopathy of left eye, without macular edema, associated with type 1 diabetes mellitus ([place]) 11/04/2019  . Retinal hemorrhage of right eye 11/04/2019  . Chronic pain syndrome 11/13/2018  . Dizziness 09/18/2018  . Arthralgia 08/14/2018  . Mild intermittent asthma without complication 85/63/1497  . Snoring 08/12/2018  . Breast lump 08/12/2018  . Lung nodule 08/12/2018  . Chest wall pain 08/02/2018  . Eczema 08/02/2018  . Diabetic peripheral neuropathy ([place]) 01/11/2018  . Low back pain with left-sided sciatica 11/29/2017  . Peripheral polyneuropathy 11/29/2017  . Multinodular goiter 07/29/2015  . Type 1 diabetes mellitus with neurological manifestations, uncontrolled ([place]) 01/27/2014  . Restless leg 11/07/2013  . Cholesteatoma 09/11/2011  . Allergy to environmental factors  09/11/2011  . Anxiety 05/01/2011  . Acid reflux 04/01/2010  . Absence of bladder continence 04/01/2010  . INSOMNIA 02/18/2010  . GERD 12/21/2008  . SMOKER 09/25/2007    Past Medical History:  Diagnosis Date  . Anxiety state, unspecified 03/26/2009  . ASTHMA 02/02/2007  . Asthma   . DEPRESSION 02/16/2009  . DIABETES MELLITUS, TYPE I 02/02/2007  . Endometriosis   . GERD 12/21/2008  . Herpes    at 18  . Hyperlipidemia   . Lesion of vocal cord   . Osteoarthritis   . Shortness of breath 09/25/2007  . Vocal fold leukoplakia     Family History  Problem Relation Age of Onset  . Diabetes Mother   . Diabetes Maternal Aunt   . Emphysema Maternal Aunt   . Emphysema Maternal Uncle   . Diabetes Maternal Grandfather   . Lung cancer Maternal Aunt   . Colon cancer Neg Hx   . Kidney disease Neg Hx   . Liver disease Neg Hx   . Esophageal cancer Neg Hx   . Rectal cancer Neg Hx   . Stomach cancer Neg Hx    Past Surgical History:  Procedure Laterality Date  . ABDOMINAL HYSTERECTOMY  2009   BSO  . DIRECT LARYNGOSCOPY N/A 08/17/2017   Procedure: DIRECT LARYNGOSCOPY WITH OPERATING TELESCOPE WITH BIOPSY;  Surgeon: Helayne Seminole, MD;  Location: [facility];  Service: ENT;  Laterality: N/A;  . NASOPHARYNGEAL BIOPSY Right 08/17/2017   Procedure: NASOPHARYNGEAL BIOPSY RIGHT NASAL LESION;  Surgeon: Helayne Seminole, MD;  Location: [facility];  Service: ENT;  Laterality: Right;   Social History   Social History Narrative   2 children--pt has custody. Husband pays child support.   Daily Caffeine Use-3 2 liters a day  Pt does not get regular exercise.   Immunization History  Administered Date(s) Administered  . Influenza Split 04/11/2012  . Influenza,inj,Quad PF,6+ Mos 08/02/2018  . Td 07/19/1999     Objective: Vital Signs: There were no vitals taken for this visit.   Physical Exam   Musculoskeletal Exam: ***  CDAI Exam: CDAI Score: -- Patient Global: --; Provider Global: -- Swollen: --;  Tender: -- Joint Exam 12/04/2019   No joint exam has been documented for this visit   There is currently no information documented on the homunculus. Go to the Rheumatology activity and complete the homunculus joint exam.  Investigation: No additional findings.  Imaging: Color Fundus Photography Optos - OU - Both Eyes  Result Date: 11/04/2019 Right Eye Progression has been stable. Disc findings include normal observations. Macula : microaneurysms. Left Eye Progression has been stable. Disc findings include normal observations. Macula : microaneurysms. Notes OU with severe nonproliferative diabetic retinopathy largely anterior to the equator.  Some microaneurysms are present in the macula.  There is no clinically significant macular edema.  There is no need for intervention therapeutically OU at this time   Recent Labs: Lab Results  Component Value Date   WBC 5.9 09/18/2018   HGB 14.9 09/18/2018   PLT 202.0 09/18/2018   NA 134 (L) 09/18/2018   K 4.1 09/18/2018   CL 100 09/18/2018   CO2 28 09/18/2018   GLUCOSE 225 (H) 09/18/2018   BUN 7 09/18/2018   CREATININE 0.80 09/11/2019   BILITOT 0.3 06/19/2017   ALKPHOS 69 06/19/2017   AST 13 06/19/2017   ALT 13 06/19/2017   PROT 6.6 11/29/2017   ALBUMIN 4.5 06/19/2017   CALCIUM 9.4 09/18/2018   GFRAA >60 05/31/2018  October 23, 2019 ANA 1: 40 cytoplasmic, RF negative, anti-CCP negative, '14 3 3 ' eta negative, HLA-B27 negative, ESR 11  Speciality Comments: No specialty comments available.  Procedures:  No procedures performed Allergies: Patient has no known allergies.   Assessment / Plan:     Visit Diagnoses: No diagnosis found.  Orders: No orders of the defined types were placed in this encounter.  No orders of the defined types were placed in this encounter.   Face-to-face time spent with patient was *** minutes. Greater than 50% of time was spent in counseling and coordination of care.  Follow-Up Instructions: No follow-ups  on file.   Bo Merino, MD  Note - This record has been created using Editor, commissioning.  Chart creation errors have been sought, but may not always  have been located. Such creation errors do not reflect on  the standard of medical care.

## 2019-12-04 ENCOUNTER — Ambulatory Visit: Payer: 59 | Admitting: Rheumatology

## 2019-12-05 NOTE — Progress Notes (Deleted)
Office Visit Note  Patient: [name]             Date of Birth: [birth year]           MRN: [id number]             PCP: Tamsen Roers, MD Referring: Tamsen Roers, MD Visit Date: 12/11/2019 Occupation: _0 @  Subjective:  No chief complaint on file.   History of Present Illness: [name] is a 44 y.o. female ***   Activities of Daily Living:  Patient reports morning stiffness for *** {minute/hour:19697}.   Patient {ACTIONS;DENIES/REPORTS:21021675::"Denies"} nocturnal pain.  Difficulty dressing/grooming: {ACTIONS;DENIES/REPORTS:21021675::"Denies"} Difficulty climbing stairs: {ACTIONS;DENIES/REPORTS:21021675::"Denies"} Difficulty getting out of chair: {ACTIONS;DENIES/REPORTS:21021675::"Denies"} Difficulty using hands for taps, buttons, cutlery, and/or writing: {ACTIONS;DENIES/REPORTS:21021675::"Denies"}  No Rheumatology ROS completed.   PMFS History:  Patient Active Problem List   Diagnosis Date Noted  . Primary osteoarthritis of both knees 11/30/2019  . Primary osteoarthritis of both feet 11/30/2019  . Primary osteoarthritis of both hands 11/30/2019  . Severe nonproliferative diabetic retinopathy of right eye, without macular edema, associated with type 1 diabetes mellitus ([place]) 11/04/2019  . Severe nonproliferative diabetic retinopathy of left eye, without macular edema, associated with type 1 diabetes mellitus ([place]) 11/04/2019  . Retinal hemorrhage of right eye 11/04/2019  . Chronic pain syndrome 11/13/2018  . Dizziness 09/18/2018  . Arthralgia 08/14/2018  . Mild intermittent asthma without complication 70/96/2836  . Snoring 08/12/2018  . Breast lump 08/12/2018  . Lung nodule 08/12/2018  . Chest wall pain 08/02/2018  . Eczema 08/02/2018  . Diabetic peripheral neuropathy ([place]) 01/11/2018  . Low back pain with left-sided sciatica 11/29/2017  . Peripheral polyneuropathy 11/29/2017  . Multinodular goiter 07/29/2015  . Type 1 diabetes mellitus with  neurological manifestations, uncontrolled ([place]) 01/27/2014  . Restless leg 11/07/2013  . Cholesteatoma 09/11/2011  . Allergy to environmental factors 09/11/2011  . Anxiety 05/01/2011  . Acid reflux 04/01/2010  . Absence of bladder continence 04/01/2010  . INSOMNIA 02/18/2010  . GERD 12/21/2008  . SMOKER 09/25/2007    Past Medical History:  Diagnosis Date  . Anxiety state, unspecified 03/26/2009  . ASTHMA 02/02/2007  . Asthma   . DEPRESSION 02/16/2009  . DIABETES MELLITUS, TYPE I 02/02/2007  . Endometriosis   . GERD 12/21/2008  . Herpes    at 18  . Hyperlipidemia   . Lesion of vocal cord   . Osteoarthritis   . Shortness of breath 09/25/2007  . Vocal fold leukoplakia     Family History  Problem Relation Age of Onset  . Diabetes Mother   . Diabetes Maternal Aunt   . Emphysema Maternal Aunt   . Emphysema Maternal Uncle   . Diabetes Maternal Grandfather   . Lung cancer Maternal Aunt   . Colon cancer Neg Hx   . Kidney disease Neg Hx   . Liver disease Neg Hx   . Esophageal cancer Neg Hx   . Rectal cancer Neg Hx   . Stomach cancer Neg Hx    Past Surgical History:  Procedure Laterality Date  . ABDOMINAL HYSTERECTOMY  2009   BSO  . DIRECT LARYNGOSCOPY N/A 08/17/2017   Procedure: DIRECT LARYNGOSCOPY WITH OPERATING TELESCOPE WITH BIOPSY;  Surgeon: Helayne Seminole, MD;  Location: [facility];  Service: ENT;  Laterality: N/A;  . NASOPHARYNGEAL BIOPSY Right 08/17/2017   Procedure: NASOPHARYNGEAL BIOPSY RIGHT NASAL LESION;  Surgeon: Helayne Seminole, MD;  Location: [facility];  Service: ENT;  Laterality: Right;   Social History  Social History Narrative   2 children--pt has custody. Husband pays child support.   Daily Caffeine Use-3 2 liters a day   Pt does not get regular exercise.   Immunization History  Administered Date(s) Administered  . Influenza Split 04/11/2012  . Influenza,inj,Quad PF,6+ Mos 08/02/2018  . Td 07/19/1999     Objective: Vital Signs: There were no vitals  taken for this visit.   Physical Exam   Musculoskeletal Exam: ***  CDAI Exam: CDAI Score: -- Patient Global: --; Provider Global: -- Swollen: --; Tender: -- Joint Exam 12/11/2019   No joint exam has been documented for this visit   There is currently no information documented on the homunculus. Go to the Rheumatology activity and complete the homunculus joint exam.  Investigation: No additional findings.  Imaging: No results found.  Recent Labs: Lab Results  Component Value Date   WBC 5.9 09/18/2018   HGB 14.9 09/18/2018   PLT 202.0 09/18/2018   NA 134 (L) 09/18/2018   K 4.1 09/18/2018   CL 100 09/18/2018   CO2 28 09/18/2018   GLUCOSE 225 (H) 09/18/2018   BUN 7 09/18/2018   CREATININE 0.80 09/11/2019   BILITOT 0.3 06/19/2017   ALKPHOS 69 06/19/2017   AST 13 06/19/2017   ALT 13 06/19/2017   PROT 6.6 11/29/2017   ALBUMIN 4.5 06/19/2017   CALCIUM 9.4 09/18/2018   GFRAA >60 05/31/2018  October 23, 2019 ANA 1: 40 speckled, ESR 11, RF negative, anti-CCP negative, _0 eta negative, HLA-B27 negative  04/08/18: RF<14, anti-CCP<16, CRP<0.3,ANA-, CBC WNL, Vit D 32, Vit B12 590, TSH 0.65, T4 1.1, Homocysteine 5.9, thyroid peroxidase ab 2, thyroglobulin ab-   Speciality Comments: No specialty comments available.  Procedures:  No procedures performed Allergies: Patient has no known allergies.   Assessment / Plan:     Visit Diagnoses: No diagnosis found.  Orders: No orders of the defined types were placed in this encounter.  No orders of the defined types were placed in this encounter.   Face-to-face time spent with patient was *** minutes. Greater than 50% of time was spent in counseling and coordination of care.  Follow-Up Instructions: No follow-ups on file.   Bo Merino, MD  Note - This record has been created using Editor, commissioning.  Chart creation errors have been sought, but may not always  have been located. Such creation errors do not reflect on   the standard of medical care.

## 2019-12-08 ENCOUNTER — Telehealth: Payer: Self-pay | Admitting: Nutrition

## 2019-12-08 ENCOUNTER — Telehealth: Payer: Self-pay

## 2019-12-08 NOTE — Telephone Encounter (Signed)
-----   Message from Renato Shin, MD sent at 12/08/2019  3:20 PM EDT ----- Madaline Brilliant, sorry about that.  She still needs f/u appt, but i'll do the form--just send it here. ----- Message ----- From: Aleatha Borer, LPN Sent: [id number]  12:52 PM EDT To: Renato Shin, MD, Ocie Doyne, RN  Dr. [name] - pt was actually seen in office 09/15/19. You advised f/u in 3 months. However, when I gave you her [employer] form to complete a PA with Medicaid for her DME, you advised pt would need an appt. Please advise if the from can be completed w/o an appt or if one is still required.  Thanks ----- Message ----- From: Ocie Doyne, RN Sent: 12/08/2019  12:38 PM EDT To: Renato Shin, MD, Aleatha Borer, LPN  Hi you two.  This patient says she saw Dr. [name] on 4/13, and you all did a HgbA1C and it was 9.6 (with a finger stick), also that Dr. [name] wrote a script for One Touch test strips while in the room with her.  Also she said that Dr. [name] wrote something in the computer that "she needed to get her pump ASAP.  Says she can not get her pump, because there are no notes sent for this visit. And now she is on Lantus 13u plus Humalog 4-6u per sliding scale, plus a "whole lot more, because her blood sugars are so high".  Says blood sugars are all over the place,29-500, and she has lost 10 pounds.  She went off her Animas pump because it was too old, and she can not get supplies. I have a sample OmniPod pump with pods I can give her if you want me to start her on a pump this week.  She has ordered the Tandem pump.  Please let me know if I can do something Vaughan Basta

## 2019-12-08 NOTE — Telephone Encounter (Signed)
Documents placed on Dr. Cordelia Pen desk for him to review and complete. Will fax once returned.

## 2019-12-09 NOTE — Telephone Encounter (Signed)
Form completed, signed and returned by Dr. [name]. Will fax to [facility] and await their response.

## 2019-12-09 NOTE — Telephone Encounter (Signed)
FAXED [place]: [place]: Rx for CGM, [place] [employer] PA form and CMN for CGM/pump and pump supplies Other records requested: Office notes  All above requested information has been faxed successfully to [employer] listed above. Documents and fax confirmation have been placed in the faxed file for future reference.

## 2019-12-11 ENCOUNTER — Telehealth: Payer: Self-pay

## 2019-12-11 ENCOUNTER — Ambulatory Visit: Payer: Medicaid Other | Admitting: Rheumatology

## 2019-12-11 MED ORDER — VALACYCLOVIR HCL 500 MG PO TABS
500.0000 mg | ORAL_TABLET | Freq: Two times a day (BID) | ORAL | 3 refills | Status: DC
Start: 2019-12-11 — End: 2022-03-14

## 2019-12-11 NOTE — Telephone Encounter (Signed)
Patient with history of HSV diagnosed many years ago. She has infrequent outbreaks but one started last night. She would like Valtrex Rx. Ok to send new Rx with refills?

## 2019-12-11 NOTE — Telephone Encounter (Signed)
Spoke with Dr. Dellis Filbert face to face and she okayed Rx for Valtrex 500mg  bid x 5 days with refills. I called patient back and reviewed directions and let her know Rx sent.

## 2019-12-17 ENCOUNTER — Telehealth: Payer: Self-pay | Admitting: Internal Medicine

## 2019-12-17 NOTE — Telephone Encounter (Signed)
Pt c/o BP issue: STAT if pt c/o blurred vision, one-sided weakness or slurred speech  1. What are your last 5 BP readings? No readings available  2. Are you having any other symptoms (ex. Dizziness, headache, blurred vision, passed out)? No  3. What is your BP issue? Patient states BP has been been fluctuating and she is requesting medication to assist with stabilization. Please call.

## 2019-12-17 NOTE — Telephone Encounter (Signed)
Returned call to patient she stated she stopped B/P medication over 6 months ago.Stated she stopped taking because she was prescribed other medications.She does not remember name of B/P medication.She has not been taking B/P, but stated B/P will go up when she gets anxious.Stated when she goes to PCP B/P is normal and PCP will not restart B/P med.Appointemnt scheduled with Dr.Acharya 6/10 at 3:20 pm.Advised to check B/P daily and keep a diary.Advised to bring B/P readings to appointment.Also bring all medications.

## 2019-12-21 NOTE — Progress Notes (Signed)
Office Visit Note  Patient: [name]             Date of Birth: 09-02-1975           MRN: [id number]             PCP: Tamsen Roers, MD Referring: Tamsen Roers, MD Visit Date: 12/31/2019 Occupation: _0 @  Subjective:  Pain in multiple joints.   History of Present Illness: [name] is a 44 y.o. female with history of osteoarthritis and chronic pain. She states she continues to have generalized pain and discomfort. She has good days and bad days and with episodes of increased fatigue and pain. She states all of her joints and muscles are painful. She is unable to do routine activities. She is in constant pain. She believes that she developed some swelling in her hands and knees.  Activities of Daily Living:  Patient reports morning stiffness for 24  hours.   Patient Reports nocturnal pain.  Difficulty dressing/grooming: Reports Difficulty climbing stairs: Reports Difficulty getting out of chair: Reports Difficulty using hands for taps, buttons, cutlery, and/or writing: Reports  Review of Systems  Constitutional: Positive for fatigue. Negative for night sweats, weight gain and weight loss.  HENT: Positive for mouth dryness. Negative for mouth sores, trouble swallowing, trouble swallowing and nose dryness.   Eyes: Negative for pain, redness, itching, visual disturbance and dryness.  Respiratory: Positive for wheezing. Negative for cough, shortness of breath and difficulty breathing.   Cardiovascular: Negative for chest pain, palpitations, hypertension, irregular heartbeat and swelling in legs/feet.  Gastrointestinal: Negative for blood in stool, constipation and diarrhea.  Endocrine: Negative for increased urination.  Genitourinary: Negative for difficulty urinating, painful urination and vaginal dryness.  Musculoskeletal: Positive for arthralgias, joint pain, myalgias, morning stiffness, muscle tenderness and myalgias. Negative for joint swelling and muscle  weakness.  Skin: Positive for hair loss. Negative for color change, rash, redness, skin tightness, ulcers and sensitivity to sunlight.  Allergic/Immunologic: Negative for susceptible to infections.  Neurological: Positive for headaches and memory loss. Negative for dizziness, numbness, night sweats and weakness.       Related to HTN per patient  Hematological: Negative for bruising/bleeding tendency and swollen glands.  Psychiatric/Behavioral: Positive for depressed mood and sleep disturbance. Negative for confusion. The patient is nervous/anxious.     PMFS History:  Patient Active Problem List   Diagnosis Date Noted  . Primary osteoarthritis of both knees 11/30/2019  . Primary osteoarthritis of both feet 11/30/2019  . Primary osteoarthritis of both hands 11/30/2019  . Severe nonproliferative diabetic retinopathy of right eye, without macular edema, associated with type 1 diabetes mellitus ([place]) 11/04/2019  . Severe nonproliferative diabetic retinopathy of left eye, without macular edema, associated with type 1 diabetes mellitus ([place]) 11/04/2019  . Retinal hemorrhage of right eye 11/04/2019  . Chronic pain syndrome 11/13/2018  . Dizziness 09/18/2018  . Arthralgia 08/14/2018  . Mild intermittent asthma without complication 03/54/6568  . Snoring 08/12/2018  . Breast lump 08/12/2018  . Lung nodule 08/12/2018  . Chest wall pain 08/02/2018  . Eczema 08/02/2018  . Diabetic peripheral neuropathy ([place]) 01/11/2018  . Low back pain with left-sided sciatica 11/29/2017  . Peripheral polyneuropathy 11/29/2017  . Multinodular goiter 07/29/2015  . Type 1 diabetes mellitus with neurological manifestations, uncontrolled ([place]) 01/27/2014  . Restless leg 11/07/2013  . Cholesteatoma 09/11/2011  . Allergy to environmental factors 09/11/2011  . Anxiety 05/01/2011  . Acid reflux 04/01/2010  . Absence of bladder continence 04/01/2010  .  INSOMNIA 02/18/2010  . GERD 12/21/2008  . SMOKER 09/25/2007      Past Medical History:  Diagnosis Date  . Anxiety state, unspecified 03/26/2009  . ASTHMA 02/02/2007  . Asthma   . DEPRESSION 02/16/2009  . DIABETES MELLITUS, TYPE I 02/02/2007  . Endometriosis   . GERD 12/21/2008  . Herpes    at 18  . Hyperlipidemia   . Lesion of vocal cord   . Osteoarthritis   . Shortness of breath 09/25/2007  . Vocal fold leukoplakia     Family History  Problem Relation Age of Onset  . Diabetes Mother   . Diabetes Maternal Aunt   . Emphysema Maternal Aunt   . Emphysema Maternal Uncle   . Diabetes Maternal Grandfather   . Lung cancer Maternal Aunt   . Colon cancer Neg Hx   . Kidney disease Neg Hx   . Liver disease Neg Hx   . Esophageal cancer Neg Hx   . Rectal cancer Neg Hx   . Stomach cancer Neg Hx    Past Surgical History:  Procedure Laterality Date  . ABDOMINAL HYSTERECTOMY  2009   BSO  . DIRECT LARYNGOSCOPY N/A 08/17/2017   Procedure: DIRECT LARYNGOSCOPY WITH OPERATING TELESCOPE WITH BIOPSY;  Surgeon: Helayne Seminole, MD;  Location: [facility];  Service: ENT;  Laterality: N/A;  . NASOPHARYNGEAL BIOPSY Right 08/17/2017   Procedure: NASOPHARYNGEAL BIOPSY RIGHT NASAL LESION;  Surgeon: Helayne Seminole, MD;  Location: [facility];  Service: ENT;  Laterality: Right;   Social History   Social History Narrative   2 children--pt has custody. Husband pays child support.   Daily Caffeine Use-3 2 liters a day   Pt does not get regular exercise.   Immunization History  Administered Date(s) Administered  . Influenza Split 04/11/2012  . Influenza,inj,Quad PF,6+ Mos 08/02/2018  . Td 07/19/1999     Objective: Vital Signs: BP 134/81 (BP Location: Left Arm, Patient Position: Sitting, Cuff Size: Normal)   Pulse (!) 108   Resp 13   Ht 5' 4" (1.626 m)   Wt 126 lb 9.6 oz (57.4 kg)   BMI 21.73 kg/m    Physical Exam Vitals and nursing note reviewed.  Constitutional:      Appearance: She is well-developed.  HENT:     Head: Normocephalic and atraumatic.  Eyes:      Conjunctiva/sclera: Conjunctivae normal.  Cardiovascular:     Rate and Rhythm: Normal rate and regular rhythm.     Heart sounds: Normal heart sounds.  Pulmonary:     Effort: Pulmonary effort is normal.     Breath sounds: Normal breath sounds.  Abdominal:     General: Bowel sounds are normal.     Palpations: Abdomen is soft.  Musculoskeletal:     Cervical back: Normal range of motion.  Lymphadenopathy:     Cervical: No cervical adenopathy.  Skin:    General: Skin is warm and dry.     Capillary Refill: Capillary refill takes less than 2 seconds.  Neurological:     Mental Status: She is alert and oriented to person, place, and time.  Psychiatric:        Behavior: Behavior normal.      Musculoskeletal Exam: C-spine was in good range of motion. She has painful limited range of motion of her lumbar spine. No SI joint tenderness was noted. Shoulder joints, elbow joints with good range of motion. She has bilateral DIP PIP and CMC thickening. No MCP changes were noted. Hip joints,  knee joints, ankles, MTPs with good range of motion with no synovitis.  CDAI Exam: CDAI Score: -- Patient Global: --; Provider Global: -- Swollen: --; Tender: -- Joint Exam 12/31/2019   No joint exam has been documented for this visit   There is currently no information documented on the homunculus. Go to the Rheumatology activity and complete the homunculus joint exam.  Investigation: No additional findings.  Imaging: No results found.  Recent Labs: Lab Results  Component Value Date   WBC 5.9 09/18/2018   HGB 14.9 09/18/2018   PLT 202.0 09/18/2018   NA 134 (L) 09/18/2018   K 4.1 09/18/2018   CL 100 09/18/2018   CO2 28 09/18/2018   GLUCOSE 225 (H) 09/18/2018   BUN 7 09/18/2018   CREATININE 0.80 09/11/2019   BILITOT 0.3 06/19/2017   ALKPHOS 69 06/19/2017   AST 13 06/19/2017   ALT 13 06/19/2017   PROT 6.6 11/29/2017   ALBUMIN 4.5 06/19/2017   CALCIUM 9.4 09/18/2018   GFRAA >60 05/31/2018   October 23, 2019 ANA 1: 40  cytoplasmic and NS, ESR 11, RF negative, anti-CCP negative, HLA-B27 negative  Speciality Comments: No specialty comments available.  Procedures:  No procedures performed Allergies: Patient has no known allergies.   Assessment / Plan:     Visit Diagnoses: Polyarthralgia - History of pain in multiple joints for over 12 years.  All autoimmune work-up was negative in the past and also at the last visit. I detailed discussion regarding all the lab work with the patient.  Chronic pain of both shoulders - History of bilateral rotator cuff tear per patient.  Primary osteoarthritis of both hands - Patient complains of pain in bilateral hands.  She has mild osteoarthritic changes on the x-rays. Joint protection muscle strengthening was discussed.  Trigger finger, right middle finger - Evaluated  by hand surgeon in the past per patient.  Trigger finger, left ring finger  Primary osteoarthritis of both knees - According to patient she was advised bilateral total knee replacement.  I do not have x-rays to review.  Primary osteoarthritis of both feet-she has chronic pain.  Chronic midline low back pain without sciatica - Followed by Dr. Delilah Shan.  HLA-B27 negative.  Chronic pain syndrome-patient states she has chronic pain and she is in pain despite the medications. I discussed that she may have fibromyalgia. The management should be treatment of insomnia and regular exercise.  Type 1 diabetes mellitus with neurological manifestations, uncontrolled (HCC)  Peripheral polyneuropathy  History of gastroesophageal reflux (GERD)  Lung nodule  Smoker  Mild intermittent asthma without complication  Multinodular goiter  Eczema, unspecified type  Orders: No orders of the defined types were placed in this encounter.  No orders of the defined types were placed in this encounter.     Follow-Up Instructions: Return if symptoms worsen or fail to improve, for  Osteoarthritis.   Bo Merino, MD  Note - This record has been created using Editor, commissioning.  Chart creation errors have been sought, but may not always  have been located. Such creation errors do not reflect on  the standard of medical care.

## 2019-12-23 ENCOUNTER — Telehealth: Payer: Self-pay | Admitting: Nutrition

## 2019-12-23 NOTE — Telephone Encounter (Signed)
I finished filling out the form and faxed it back yesterday.

## 2019-12-23 NOTE — Telephone Encounter (Signed)
Patient reports that she called [employer], and the form for the Dexcom/pump was not completely filled out.  Question 3 and 30 were not answered.  Please answer,and refax, so that she can get her supplies, and start back on her pump. Also she is running low on Humalog pens and Lantus insulin, and accu-Chek guide test strips.  Please order

## 2019-12-25 ENCOUNTER — Other Ambulatory Visit: Payer: Self-pay

## 2019-12-25 ENCOUNTER — Encounter: Payer: Self-pay | Admitting: Internal Medicine

## 2019-12-25 ENCOUNTER — Ambulatory Visit (INDEPENDENT_AMBULATORY_CARE_PROVIDER_SITE_OTHER): Payer: Medicaid Other | Admitting: Internal Medicine

## 2019-12-25 VITALS — BP 148/86 | HR 107 | Ht 65.0 in | Wt 217.6 lb

## 2019-12-25 DIAGNOSIS — E782 Mixed hyperlipidemia: Secondary | ICD-10-CM | POA: Diagnosis not present

## 2019-12-25 DIAGNOSIS — R072 Precordial pain: Secondary | ICD-10-CM

## 2019-12-25 DIAGNOSIS — Z720 Tobacco use: Secondary | ICD-10-CM | POA: Diagnosis not present

## 2019-12-25 DIAGNOSIS — I10 Essential (primary) hypertension: Secondary | ICD-10-CM | POA: Diagnosis not present

## 2019-12-25 DIAGNOSIS — E1065 Type 1 diabetes mellitus with hyperglycemia: Secondary | ICD-10-CM

## 2019-12-25 DIAGNOSIS — IMO0002 Reserved for concepts with insufficient information to code with codable children: Secondary | ICD-10-CM

## 2019-12-25 MED ORDER — AMLODIPINE BESYLATE 5 MG PO TABS
5.0000 mg | ORAL_TABLET | Freq: Every day | ORAL | 3 refills | Status: DC
Start: 2019-12-25 — End: 2020-02-25

## 2019-12-25 NOTE — Patient Instructions (Signed)
Medication Instructions:  Amlodipine 5mg  Daily *If you need a refill on your cardiac medications before your next appointment, please call your pharmacy*   Lab Work: None  If you have labs (blood work) drawn today and your tests are completely normal, you will receive your results only by: Marland Kitchen MyChart Message (if you have MyChart) OR . A paper copy in the mail If you have any lab test that is abnormal or we need to change your treatment, we will call you to review the results.   Testing/Procedures: None   Follow-Up: At [facility], you and your health needs are our priority.  As part of our continuing mission to provide you with exceptional heart care, we have created designated Provider Care Teams.  These Care Teams include your primary Cardiologist (physician) and Advanced Practice Providers (APPs -  Physician Assistants and Nurse Practitioners) who all work together to provide you with the care you need, when you need it.  Your next appointment:   2-3 month(s)  The format for your next appointment:   In Person  Provider:   You may see  one of the following Advanced Practice Providers on your designated Care Team:    Rosaria Ferries, PA-C  Jory Sims, DNP, ANP  Cadence Kathlen Mody, NP    Other Instructions We will our Care Team Social Worker contact you to see if we can be of assistance.   Your physician discussed the hazards of tobacco use. Tobacco use cessation is recommended and techniques and options to help you quit were discussed.    Coping with Quitting Smoking  Quitting smoking is a physical and mental challenge. You will face cravings, withdrawal symptoms, and temptation. Before quitting, work with your health care provider to make a plan that can help you cope. Preparation can help you quit and keep you from giving in. How can I cope with cravings? Cravings usually last for 5-10 minutes. If you get through it, the craving will pass. Consider taking the  following actions to help you cope with cravings:  Keep your mouth busy: ? Chew sugar-free gum. ? Suck on hard candies or a straw. ? Brush your teeth.  Keep your hands and body busy: ? Immediately change to a different activity when you feel a craving. ? Squeeze or play with a ball. ? Do an activity or a hobby, like making bead jewelry, practicing needlepoint, or working with wood. ? Mix up your normal routine. ? Take a short exercise break. Go for a quick walk or run up and down stairs. ? Spend time in public places where smoking is not allowed.  Focus on doing something kind or helpful for someone else.  Call a friend or family member to talk during a craving.  Join a support group.  Call a quit line, such as 1-800-QUIT-NOW.  Talk with your health care provider about medicines that might help you cope with cravings and make quitting easier for you. How can I deal with withdrawal symptoms? Your body may experience negative effects as it tries to get used to not having nicotine in the system. These effects are called withdrawal symptoms. They may include:  Feeling hungrier than normal.  Trouble concentrating.  Irritability.  Trouble sleeping.  Feeling depressed.  Restlessness and agitation.  Craving a cigarette. To manage withdrawal symptoms:  Avoid places, people, and activities that trigger your cravings.  Remember why you want to quit.  Get plenty of sleep.  Avoid coffee and other caffeinated drinks. These  may worsen some of your symptoms. How can I handle social situations? Social situations can be difficult when you are quitting smoking, especially in the first few weeks. To manage this, you can:  Avoid parties, bars, and other social situations where people might be smoking.  Avoid alcohol.  Leave right away if you have the urge to smoke.  Explain to your family and friends that you are quitting smoking. Ask for understanding and support.  Plan  activities with friends or family where smoking is not an option. What are some ways I can cope with stress? Wanting to smoke may cause stress, and stress can make you want to smoke. Find ways to manage your stress. Relaxation techniques can help. For example:  Breathe slowly and deeply, in through your nose and out through your mouth.  Listen to soothing, relaxing music.  Talk with a family member or friend about your stress.  Light a candle.  Soak in a bath or take a shower.  Think about a peaceful place. What are some ways I can prevent weight gain? Be aware that many people gain weight after they quit smoking. However, not everyone does. To keep from gaining weight, have a plan in place before you quit and stick to the plan after you quit. Your plan should include:  Having healthy snacks. When you have a craving, it may help to: ? Eat plain popcorn, crunchy carrots, celery, or other cut vegetables. ? Chew sugar-free gum.  Changing how you eat: ? Eat small portion sizes at meals. ? Eat 4-6 small meals throughout the day instead of 1-2 large meals a day. ? Be mindful when you eat. Do not watch television or do other things that might distract you as you eat.  Exercising regularly: ? Make time to exercise each day. If you do not have time for a long workout, do short bouts of exercise for 5-10 minutes several times a day. ? Do some form of strengthening exercise, like weight lifting, and some form of aerobic exercise, like running or swimming.  Drinking plenty of water or other low-calorie or no-calorie drinks. Drink 6-8 glasses of water daily, or as much as instructed by your health care provider. Summary  Quitting smoking is a physical and mental challenge. You will face cravings, withdrawal symptoms, and temptation to smoke again. Preparation can help you as you go through these challenges.  You can cope with cravings by keeping your mouth busy (such as by chewing gum),  keeping your body and hands busy, and making calls to family, friends, or a helpline for people who want to quit smoking.  You can cope with withdrawal symptoms by avoiding places where people smoke, avoiding drinks with caffeine, and getting plenty of rest.  Ask your health care provider about the different ways to prevent weight gain, avoid stress, and handle social situations. This information is not intended to replace advice given to you by your health care provider. Make sure you discuss any questions you have with your health care provider. Document Revised: 06/15/2017 Document Reviewed: 06/30/2016 Elsevier Patient Education  2020 [employer].

## 2019-12-25 NOTE — Progress Notes (Signed)
Cardiology Office Note:    Date:  12/25/2019   ID:  [name], DOB [birth year], MRN [id number]  PCP:  Tamsen Roers, MD  Cardiologist:  Elouise Munroe, MD  Electrophysiologist:  None   Referring MD: Tamsen Roers, MD   Chief Complaint: significant stress. Elevated BP  History of Present Illness:    [name] is a 44 y.o. female with a history of asthma, depression, type 1 diabetes, endometriosis, GERD, hyperlipidemia, dyspnea.  I initially met the patient in emergency department follow-up in our [facility] office in November 2019.  At that time she describes mid chest heaviness that radiates to her teeth, jaws, left shoulder while exerting herself and improving with rest.  She also had exertional shortness of breath.  She noted a sense of difficulty breathing as soon as she wakes up in the morning.  She has type 1 diabetes and tells me that her mother has coronary artery disease in the setting of type 1 diabetes as well.  We performed a Myoview at that time which was low risk.   We have attempted to perform a coronary CTA given continued atypical chest pain, however her HR was too high to get a quality scan. Calcium scoring only was performed and calcium score is 0. She was very worried about not being able to complete the scan. We discussed with no coronary calcifications, atypical chest pain and normal myoview, severe CAD is less likely, however if we can get better control of her HR with medical therapy and lifestyle factors, we can certainly try again.   Her main concern is elevated blood pressure and major life stressors. She finds it difficult to relieve her stress and this contributes to her blood pressure. We've discussed how this also may be impacting chest pain symptoms.    Past Medical History:  Diagnosis Date  . Anxiety state, unspecified 03/26/2009  . ASTHMA 02/02/2007  . Asthma   . DEPRESSION 02/16/2009  . DIABETES MELLITUS, TYPE I 02/02/2007  . Endometriosis    . GERD 12/21/2008  . Herpes    at 18  . Hyperlipidemia   . Lesion of vocal cord   . Osteoarthritis   . Shortness of breath 09/25/2007  . Vocal fold leukoplakia     Past Surgical History:  Procedure Laterality Date  . ABDOMINAL HYSTERECTOMY  2009   BSO  . DIRECT LARYNGOSCOPY N/A 08/17/2017   Procedure: DIRECT LARYNGOSCOPY WITH OPERATING TELESCOPE WITH BIOPSY;  Surgeon: Helayne Seminole, MD;  Location: [facility];  Service: ENT;  Laterality: N/A;  . NASOPHARYNGEAL BIOPSY Right 08/17/2017   Procedure: NASOPHARYNGEAL BIOPSY RIGHT NASAL LESION;  Surgeon: Helayne Seminole, MD;  Location: [place];  Service: ENT;  Laterality: Right;    Current Medications: Current Meds  Medication Sig  . Accu-Chek Softclix Lancets lancets 1 each by Other route 4 (four) times daily. E11.9  . albuterol (PROVENTIL HFA;VENTOLIN HFA) 108 (90 Base) MCG/ACT inhaler Inhale 2 puffs into the lungs every 6 (six) hours as needed for wheezing or shortness of breath.  . ALPRAZolam (XANAX) 1 MG tablet Take 1 mg by mouth 4 (four) times daily.  Marland Kitchen atorvastatin (LIPITOR) 10 MG tablet Take 1 tablet (10 mg total) by mouth at bedtime.  . B-D UF III MINI PEN NEEDLES 31G X 5 MM MISC 4 (four) times daily.  . Blood Glucose Monitoring Suppl (ACCU-CHEK GUIDE ME) w/Device KIT 1 each by Does not apply route 4 (four) times daily. E11.9  . budesonide-formoterol (  SYMBICORT) 80-4.5 MCG/ACT inhaler Inhale 2 puffs into the lungs 2 (two) times daily.  . Cholecalciferol (VITAMIN D3) 50 MCG (2000 UT) capsule Take 1,000 Units by mouth daily.   . cyclobenzaprine (FLEXERIL) 10 MG tablet Take 10 mg by mouth 3 (three) times daily as needed.  Marland Kitchen escitalopram (LEXAPRO) 20 MG tablet Take 20 mg by mouth daily.  Marland Kitchen gabapentin (NEURONTIN) 300 MG capsule Take 1 capsule (300 mg total) by mouth 3 (three) times daily. As needed (Patient taking differently: Take 300 mg by mouth 4 (four) times daily. As needed)  . glucagon 1 MG injection Inject 1 mg into the muscle  once as needed.  Marland Kitchen glucose blood (ACCU-CHEK GUIDE) test strip 1 each by Other route 4 (four) times daily. E11.9  . HUMALOG KWIKPEN 100 UNIT/ML KwikPen SMARTSIG:5 Unit(s) SUB-Q 3 Times Daily  . Insulin Human (INSULIN PUMP) SOLN Inject into the skin. As directed  . LANTUS SOLOSTAR 100 UNIT/ML Solostar Pen SMARTSIG:15 Unit(s) SUB-Q Daily  . meclizine (ANTIVERT) 25 MG tablet Take 1 tablet (25 mg total) by mouth 3 (three) times daily as needed for dizziness.  . meloxicam (MOBIC) 7.5 MG tablet Take 15 mg by mouth daily.  . metoCLOPramide (REGLAN) 5 MG tablet Take 1 tablet (5 mg total) by mouth 3 (three) times daily before meals.  . Multiple Vitamin (MULTIVITAMIN WITH MINERALS) TABS tablet Take 1 tablet by mouth daily.  Marland Kitchen NOVOLOG 100 UNIT/ML injection INJECT 40 UNITS DAILY PER INSULIN PUMP (MAX DOSAGE)  . omeprazole (PRILOSEC) 40 MG capsule Take 1 capsule (40 mg total) by mouth in the morning and at bedtime.  Marland Kitchen oxyCODONE-acetaminophen (PERCOCET) 10-325 MG tablet SMARTSIG:1 Pill By Mouth 1 to 4 Times Daily PRN  . PARoxetine (PAXIL) 20 MG tablet   . rOPINIRole (REQUIP) 1 MG tablet Take 1qhs (Plz sched appt with new provider)  . sucralfate (CARAFATE) 1 GM/10ML suspension Take 10 mLs (1 g total) by mouth at bedtime.  . valACYclovir (VALTREX) 500 MG tablet Take 1 tablet (500 mg total) by mouth 2 (two) times daily.  Marland Kitchen zinc gluconate 50 MG tablet Take 50 mg by mouth daily.     Allergies:   Patient has no known allergies.   Social History   Socioeconomic History  . Marital status: Legally Separated    Spouse name: Not on file  . Number of children: 2  . Years of education: Not on file  . Highest education level: Not on file  Occupational History    Employer: UNEMPLOYED  Tobacco Use  . Smoking status: Current Every Day Smoker    Packs/day: 1.50    Years: 25.00    Pack years: 37.50    Types: Cigarettes  . Smokeless tobacco: Never Used  . Tobacco comment: 1PPD as of 07/2018  Vaping Use  .  Vaping Use: Former  . Quit date: 02/23/2017  . Devices: only used tobacco   Substance and Sexual Activity  . Alcohol use: Not Currently    Alcohol/week: 0.0 standard drinks  . Drug use: No  . Sexual activity: Yes    Partners: Male    Birth control/protection: None    Comment: 1st intercourse-14, partners- 79, current partner- 4 months  Other Topics Concern  . Not on file  Social History Narrative   2 children--pt has custody. Husband pays child support.   Daily Caffeine Use-3 2 liters a day   Pt does not get regular exercise.   Social Determinants of Health   Financial Resource Strain:   .  Difficulty of Paying Living Expenses:   Food Insecurity:   . Worried About Charity fundraiser in the Last Year:   . Arboriculturist in the Last Year:   Transportation Needs:   . Film/video editor (Medical):   Marland Kitchen Lack of Transportation (Non-Medical):   Physical Activity:   . Days of Exercise per Week:   . Minutes of Exercise per Session:   Stress:   . Feeling of Stress :   Social Connections:   . Frequency of Communication with Friends and Family:   . Frequency of Social Gatherings with Friends and Family:   . Attends Religious Services:   . Active Member of Clubs or Organizations:   . Attends Archivist Meetings:   Marland Kitchen Marital Status:      Family History: The patient's family history includes Diabetes in her maternal aunt, maternal grandfather, and mother; Emphysema in her maternal aunt and maternal uncle; Lung cancer in her maternal aunt. There is no history of Colon cancer, Kidney disease, Liver disease, Esophageal cancer, Rectal cancer, or Stomach cancer.  ROS:   Please see the history of present illness.    All other systems reviewed and are negative.  EKGs/Labs/Other Studies Reviewed:    The following studies were reviewed today:  Recent Labs: 09/11/2019: Creatinine, Ser 0.80  Recent Lipid Panel    Component Value Date/Time   CHOL 220 (H) 05/12/2010 1152    TRIG 200.0 (H) 05/12/2010 1152   HDL 49.90 05/12/2010 1152   CHOLHDL 4 05/12/2010 1152   VLDL 40.0 05/12/2010 1152   LDLDIRECT 142.7 05/12/2010 1152    Physical Exam:    VS:  BP (!) 148/86   Pulse (!) 107   Ht '5\' 5"'  (1.651 m)   Wt 217 lb 9.6 oz (98.7 kg)   SpO2 97%   BMI 36.21 kg/m     Wt Readings from Last 5 Encounters:  12/25/19 217 lb 9.6 oz (98.7 kg)  10/23/19 134 lb (60.8 kg)  10/10/19 126 lb (57.2 kg)  10/06/19 126 lb 8 oz (57.4 kg)  09/30/19 126 lb (57.2 kg)     Constitutional: No acute distress Eyes: sclera non-icteric, normal conjunctiva and lids ENMT: normal dentition, moist mucous membranes Cardiovascular: regular rhythm, normal rate, no murmurs. S1 and S2 normal. Radial pulses normal bilaterally. No jugular venous distention.  Respiratory: clear to auscultation bilaterally GI : normal bowel sounds, soft and nontender. No distention.   MSK: extremities warm, well perfused. No edema.  NEURO: grossly nonfocal exam, moves all extremities. PSYCH: alert and oriented x 3, normal mood and affect.   ASSESSMENT:    1. Hypertension, unspecified type   2. Mixed hyperlipidemia   3. Precordial pain   4. Tobacco abuse   5. Diabetes mellitus type 1, uncontrolled, insulin dependent (HCC)    PLAN:    HTN - elevated BP today, will start amlodipine 5 mg a day. I recommended to her to follow up in a few weeks with one of my APP colleagues for med titration and BP checked, however she declines.   HLD - elevated, followed by PCP. Continue atorvastatin, would recommend uptitration to 20m or 80 mg given diabetes that is poorly controlled.   DM1  - per PCP.   Chest pain - worsened by emotional stressors. We discussed stress management strategies as well as diet and lifestyle modifications for optimal CV health.  Total time of encounter: 30 minutes total time of encounter, including 25 minutes spent in  face-to-face patient care on the date of this encounter. This time includes  coordination of care and counseling regarding above mentioned problem list. Remainder of non-face-to-face time involved reviewing chart documents/testing relevant to the patient encounter and documentation in the medical record. I have independently reviewed documentation from referring provider.   Cherlynn Kaiser, MD [place]  [facility]    Medication Adjustments/Labs and Tests Ordered: Current medicines are reviewed at length with the patient today.  Concerns regarding medicines are outlined above.  No orders of the defined types were placed in this encounter.  Meds ordered this encounter  Medications  . amLODipine (NORVASC) 5 MG tablet    Sig: Take 1 tablet (5 mg total) by mouth daily.    Dispense:  90 tablet    Refill:  3    Patient Instructions  Medication Instructions:  Amlodipine 36m Daily *If you need a refill on your cardiac medications before your next appointment, please call your pharmacy*   Lab Work: None  If you have labs (blood work) drawn today and your tests are completely normal, you will receive your results only by: .Marland KitchenMyChart Message (if you have MyChart) OR . A paper copy in the mail If you have any lab test that is abnormal or we need to change your treatment, we will call you to review the results.   Testing/Procedures: None   Follow-Up: [facility] our [facility].  As part of our continuing mission to provide you with exceptional heart care, we have created designated Provider Care Teams.  These Care Teams include your primary Cardiologist (physician) and Advanced Practice Providers (APPs -  Physician Assistants and Nurse Practitioners) who all work together to provide you with the care you need, when you need it.  Your next appointment:   2-3 month(s)  The format for your next appointment:   In Person  Provider:   You may see  one of the following Advanced Practice Providers on your designated Care Team:     RRosaria Ferries PA-C  KJory Sims DNP, ANP  Cadence FKathlen Mody NP    Other Instructions We will our Care Team Social Worker contact you to see if we can be of assistance.   Your physician discussed the hazards of tobacco use. Tobacco use cessation is recommended and techniques and options to help you quit were discussed.    Coping with Quitting Smoking  Quitting smoking is a physical and mental challenge. You will face cravings, withdrawal symptoms, and temptation. Before quitting, work with your health care provider to make a plan that can help you cope. Preparation can help you quit and keep you from giving in. How can I cope with cravings? Cravings usually last for 5-10 minutes. If you get through it, the craving will pass. Consider taking the following actions to help you cope with cravings:  Keep your mouth busy: ? Chew sugar-free gum. ? Suck on hard candies or a straw. ? Brush your teeth.  Keep your hands and body busy: ? Immediately change to a different activity when you feel a craving. ? Squeeze or play with a ball. ? Do an activity or a hobby, like making bead jewelry, practicing needlepoint, or working with wood. ? Mix up your normal routine. ? Take a short exercise break. Go for a quick walk or run up and down stairs. ? Spend time in public places where smoking is not allowed.  Focus on doing something kind or helpful  for someone else.  Call a friend or family member to talk during a craving.  Join a support group.  Call a quit line, such as 1-800-QUIT-NOW.  Talk with your health care provider about medicines that might help you cope with cravings and make quitting easier for you. How can I deal with withdrawal symptoms? Your body may experience negative effects as it tries to get used to not having nicotine in the system. These effects are called withdrawal symptoms. They may include:  Feeling hungrier than normal.  Trouble  concentrating.  Irritability.  Trouble sleeping.  Feeling depressed.  Restlessness and agitation.  Craving a cigarette. To manage withdrawal symptoms:  Avoid places, people, and activities that trigger your cravings.  Remember why you want to quit.  Get plenty of sleep.  Avoid coffee and other caffeinated drinks. These may worsen some of your symptoms. How can I handle social situations? Social situations can be difficult when you are quitting smoking, especially in the first few weeks. To manage this, you can:  Avoid parties, bars, and other social situations where people might be smoking.  Avoid alcohol.  Leave right away if you have the urge to smoke.  Explain to your family and friends that you are quitting smoking. Ask for understanding and support.  Plan activities with friends or family where smoking is not an option. What are some ways I can cope with stress? Wanting to smoke may cause stress, and stress can make you want to smoke. Find ways to manage your stress. Relaxation techniques can help. For example:  Breathe slowly and deeply, in through your nose and out through your mouth.  Listen to soothing, relaxing music.  Talk with a family member or friend about your stress.  Light a candle.  Soak in a bath or take a shower.  Think about a peaceful place. What are some ways I can prevent weight gain? Be aware that many people gain weight after they quit smoking. However, not everyone does. To keep from gaining weight, have a plan in place before you quit and stick to the plan after you quit. Your plan should include:  Having healthy snacks. When you have a craving, it may help to: ? Eat plain popcorn, crunchy carrots, celery, or other cut vegetables. ? Chew sugar-free gum.  Changing how you eat: ? Eat small portion sizes at meals. ? Eat 4-6 small meals throughout the day instead of 1-2 large meals a day. ? Be mindful when you eat. Do not watch  television or do other things that might distract you as you eat.  Exercising regularly: ? Make time to exercise each day. If you do not have time for a long workout, do short bouts of exercise for 5-10 minutes several times a day. ? Do some form of strengthening exercise, like weight lifting, and some form of aerobic exercise, like running or swimming.  Drinking plenty of water or other low-calorie or no-calorie drinks. Drink 6-8 glasses of water daily, or as much as instructed by your health care provider. Summary  Quitting smoking is a physical and mental challenge. You will face cravings, withdrawal symptoms, and temptation to smoke again. Preparation can help you as you go through these challenges.  You can cope with cravings by keeping your mouth busy (such as by chewing gum), keeping your body and hands busy, and making calls to family, friends, or a helpline for people who want to quit smoking.  You can cope with withdrawal symptoms  by avoiding places where people smoke, avoiding drinks with caffeine, and getting plenty of rest.  Ask your health care provider about the different ways to prevent weight gain, avoid stress, and handle social situations. This information is not intended to replace advice given to you by your health care provider. Make sure you discuss any questions you have with your health care provider. Document Revised: 06/15/2017 Document Reviewed: 06/30/2016 Elsevier Patient Education  2020 [employer].

## 2019-12-28 ENCOUNTER — Other Ambulatory Visit: Payer: Self-pay | Admitting: Endocrinology

## 2019-12-28 DIAGNOSIS — IMO0002 Reserved for concepts with insufficient information to code with codable children: Secondary | ICD-10-CM

## 2019-12-28 NOTE — Telephone Encounter (Signed)
1.  Please schedule f/u appt 2.  Then please refill x 1, pending that appt.  

## 2019-12-29 ENCOUNTER — Telehealth: Payer: Self-pay | Admitting: Licensed Clinical Social Worker

## 2019-12-29 NOTE — Telephone Encounter (Signed)
LDM for pt to call office to schedule a follow up appointment.  Sent in 1 with no refills, per Dr. [name]. Last OV 09/15/19 Last fill 11/18/19  #[id number]/0

## 2019-12-29 NOTE — Telephone Encounter (Signed)
CSW received referral from [place] as patient shared that she is struggling to get her insulin. CSW contacted patient who states she is waiting on an insulin pump and there has been some confusion between the equipment company and the MD office. CSW encouraged patient to return to the MD office that prescribed the pump and discuss further next steps. CSW explained that a Cardiologist would not be able to proceed with an insulin pump. Patient verbalizes understanding and will follow up with the appropriate provider. CSW available if needed. Raquel Sarna, [place], [place]

## 2019-12-30 ENCOUNTER — Telehealth: Payer: Self-pay | Admitting: Endocrinology

## 2019-12-30 ENCOUNTER — Other Ambulatory Visit: Payer: Self-pay

## 2019-12-30 MED ORDER — INSULIN ASPART 100 UNIT/ML ~~LOC~~ SOLN: SUBCUTANEOUS | 0 refills | Status: DC

## 2019-12-30 MED ORDER — LANTUS SOLOSTAR 100 UNIT/ML ~~LOC~~ SOPN: PEN_INJECTOR | SUBCUTANEOUS | 0 refills | Status: DC

## 2019-12-30 NOTE — Telephone Encounter (Signed)
Rx sent 

## 2019-12-30 NOTE — Telephone Encounter (Signed)
Medication Refill Request  Did you call your pharmacy and request this refill first? Yes  . If patient has not contacted pharmacy first, instruct them to do so for future refills.  . Remind them that contacting the pharmacy for their refill is the quickest method to get the refill.  . Refill policy also stated that it will take anywhere between 24-72 hours to receive the refill.    Name of medication? humalog and lantus  Is this a 90 day supply?   Name and location of pharmacy?   [id number] [place] [place] - [address]  [address], [place] [address]  Phone:  [phone] Fax:  [phone]

## 2019-12-31 ENCOUNTER — Encounter: Payer: Self-pay | Admitting: Rheumatology

## 2019-12-31 ENCOUNTER — Ambulatory Visit: Payer: Medicaid Other | Admitting: Rheumatology

## 2019-12-31 ENCOUNTER — Other Ambulatory Visit: Payer: Self-pay

## 2019-12-31 VITALS — BP 134/81 | HR 108 | Resp 13 | Ht 64.0 in | Wt 126.6 lb

## 2019-12-31 DIAGNOSIS — M19042 Primary osteoarthritis, left hand: Secondary | ICD-10-CM

## 2019-12-31 DIAGNOSIS — M545 Low back pain, unspecified: Secondary | ICD-10-CM

## 2019-12-31 DIAGNOSIS — M19072 Primary osteoarthritis, left ankle and foot: Secondary | ICD-10-CM

## 2019-12-31 DIAGNOSIS — L309 Dermatitis, unspecified: Secondary | ICD-10-CM

## 2019-12-31 DIAGNOSIS — M65331 Trigger finger, right middle finger: Secondary | ICD-10-CM | POA: Diagnosis not present

## 2019-12-31 DIAGNOSIS — R911 Solitary pulmonary nodule: Secondary | ICD-10-CM

## 2019-12-31 DIAGNOSIS — G894 Chronic pain syndrome: Secondary | ICD-10-CM

## 2019-12-31 DIAGNOSIS — G629 Polyneuropathy, unspecified: Secondary | ICD-10-CM

## 2019-12-31 DIAGNOSIS — M170 Bilateral primary osteoarthritis of knee: Secondary | ICD-10-CM

## 2019-12-31 DIAGNOSIS — M65342 Trigger finger, left ring finger: Secondary | ICD-10-CM

## 2019-12-31 DIAGNOSIS — E1049 Type 1 diabetes mellitus with other diabetic neurological complication: Secondary | ICD-10-CM

## 2019-12-31 DIAGNOSIS — M19041 Primary osteoarthritis, right hand: Secondary | ICD-10-CM

## 2019-12-31 DIAGNOSIS — M2550 Pain in unspecified joint: Secondary | ICD-10-CM | POA: Diagnosis not present

## 2019-12-31 DIAGNOSIS — M25511 Pain in right shoulder: Secondary | ICD-10-CM | POA: Diagnosis not present

## 2019-12-31 DIAGNOSIS — M19071 Primary osteoarthritis, right ankle and foot: Secondary | ICD-10-CM

## 2019-12-31 DIAGNOSIS — E1065 Type 1 diabetes mellitus with hyperglycemia: Secondary | ICD-10-CM

## 2019-12-31 DIAGNOSIS — F17200 Nicotine dependence, unspecified, uncomplicated: Secondary | ICD-10-CM

## 2019-12-31 DIAGNOSIS — M25512 Pain in left shoulder: Secondary | ICD-10-CM

## 2019-12-31 DIAGNOSIS — Z8719 Personal history of other diseases of the digestive system: Secondary | ICD-10-CM

## 2019-12-31 DIAGNOSIS — G8929 Other chronic pain: Secondary | ICD-10-CM

## 2019-12-31 DIAGNOSIS — E042 Nontoxic multinodular goiter: Secondary | ICD-10-CM

## 2019-12-31 DIAGNOSIS — IMO0002 Reserved for concepts with insufficient information to code with codable children: Secondary | ICD-10-CM

## 2019-12-31 DIAGNOSIS — J4520 Mild intermittent asthma, uncomplicated: Secondary | ICD-10-CM

## 2019-12-31 MED ORDER — ACCU-CHEK GUIDE ME W/DEVICE KIT: 1.0000 | PACK | Freq: Four times a day (QID) | 0 refills | Status: DC

## 2020-01-01 ENCOUNTER — Other Ambulatory Visit: Payer: Self-pay

## 2020-01-01 ENCOUNTER — Telehealth: Payer: Self-pay | Admitting: Endocrinology

## 2020-01-01 DIAGNOSIS — IMO0002 Reserved for concepts with insufficient information to code with codable children: Secondary | ICD-10-CM

## 2020-01-01 MED ORDER — HUMALOG KWIKPEN 100 UNIT/ML ~~LOC~~ SOPN: 10.0000 [IU] | PEN_INJECTOR | Freq: Four times a day (QID) | SUBCUTANEOUS | 0 refills | Status: DC

## 2020-01-01 MED ORDER — LANTUS SOLOSTAR 100 UNIT/ML ~~LOC~~ SOPN: 15.0000 [IU] | PEN_INJECTOR | Freq: Every day | SUBCUTANEOUS | 0 refills | Status: DC

## 2020-01-01 NOTE — Telephone Encounter (Signed)
Pt called [name], front desk and she placed call on hold to clarify with me some of the patient's questions. Pt then disconnected before she could return to the line. Advised I would return pt call to resolve her concerns.  Immediately returned pt call. Pt expressed following concerns:  1. Insulin pump paper work has not been sent - Advised she has been misinformed as all paperwork has been sent. Advised we are happy to provide her with copies of all her paperwork for her review. 2. Requesting to move up appt. Appt has been rescheduled for 01/05/20 @ 3:45pm 3. Claims [facility] [place] and were picked up by her daughter. States she is no longer on Novolog and will need to return. States she is needing Humalog Pens sent to [facility]. This requested Rx was sent to pharmacy of choice. 4. Confirmed Lantus was [facility]. Although she claims this is the wrong pharmacy, states she will pick up in [place] regardless. 5. Wanted to know if she will get a refund for the Novolog her daughter already picked up. Advised she will need to discuss this with her insurance and pharmacy as we are not able to assist with this issue.  Advised pt to bring a list of all medications she is taking. It appears her medication list is needing reconciled d/t numerous errors found while talking to pt. She was not able to update medications at this time stating she is currently walking in to an office for an appt.  Outpatient Medication Detail   Disp Refills Start End   HUMALOG KWIKPEN 100 UNIT/ML KwikPen 15 mL 0 01/01/2020    Sig - Route: Inject 0.1 mLs (10 Units total) into the skin 4 (four) times daily. - Subcutaneous   Sent to pharmacy as: Cleda Clarks 100 UNIT/ML KwikPen   E-Prescribing Status: Sent to pharmacy (01/01/2020 9:54 AM EDT)

## 2020-01-01 NOTE — Telephone Encounter (Signed)
Patient called to advise that her pump is not working and new one not received yet.  Patient needs insulin pens - She uses Humalog Insulin Pens.. Humalog vials were sent to the wrong pharmacy.   Patient is requesting that prescription be corrected and that correct [facility] Pens [facility] [address] in [place], [place]  Ph: [phone] - can leave detailed message

## 2020-01-04 ENCOUNTER — Other Ambulatory Visit: Payer: Self-pay | Admitting: Family Medicine

## 2020-01-05 ENCOUNTER — Encounter: Payer: Self-pay | Admitting: Endocrinology

## 2020-01-05 ENCOUNTER — Ambulatory Visit (INDEPENDENT_AMBULATORY_CARE_PROVIDER_SITE_OTHER): Payer: Medicaid Other | Admitting: Endocrinology

## 2020-01-05 ENCOUNTER — Other Ambulatory Visit: Payer: Self-pay

## 2020-01-05 VITALS — BP 124/70 | HR 120 | Ht 64.0 in | Wt 124.8 lb

## 2020-01-05 DIAGNOSIS — E1049 Type 1 diabetes mellitus with other diabetic neurological complication: Secondary | ICD-10-CM | POA: Diagnosis not present

## 2020-01-05 DIAGNOSIS — IMO0002 Reserved for concepts with insufficient information to code with codable children: Secondary | ICD-10-CM

## 2020-01-05 DIAGNOSIS — E1065 Type 1 diabetes mellitus with hyperglycemia: Secondary | ICD-10-CM | POA: Diagnosis not present

## 2020-01-05 LAB — POCT GLYCOSYLATED HEMOGLOBIN (HGB A1C): Hemoglobin A1C: 8.8 % — AB (ref 4.0–5.6)

## 2020-01-05 MED ORDER — HUMALOG KWIKPEN 100 UNIT/ML ~~LOC~~ SOPN: 8.0000 [IU] | PEN_INJECTOR | Freq: Four times a day (QID) | SUBCUTANEOUS | 0 refills | Status: DC

## 2020-01-05 MED ORDER — LANTUS SOLOSTAR 100 UNIT/ML ~~LOC~~ SOPN: 12.0000 [IU] | PEN_INJECTOR | Freq: Every day | SUBCUTANEOUS | 0 refills | Status: DC

## 2020-01-05 MED ORDER — HUMALOG KWIKPEN 100 UNIT/ML ~~LOC~~ SOPN: 10.0000 [IU] | PEN_INJECTOR | Freq: Four times a day (QID) | SUBCUTANEOUS | 0 refills | Status: DC

## 2020-01-05 MED ORDER — GLUCAGON (RDNA) 1 MG IJ KIT: 1.0000 mg | PACK | Freq: Once | INTRAMUSCULAR | 12 refills | Status: AC | PRN

## 2020-01-05 NOTE — Patient Instructions (Addendum)
Here are copies of the papers we filled out for your new pump.  When you get it, please see Vaughan Basta, to get started on it. Please continue the same insulins.   check your blood sugar 4 times a day: before the 3 meals, and at bedtime.  also check if you have symptoms of your blood sugar being too high or too low.  please keep a record of the readings and bring it to your next appointment here (or you can bring the meter itself).  You can write it on any piece of paper.  please call us sooner if your blood sugar goes below 70, or if you have a lot of readings over 200.  Please come back for a follow-up appointment in 2 months.

## 2020-01-05 NOTE — Progress Notes (Signed)
Subjective:    Patient ID: [name], female    DOB: 08-04-1975, 44 y.o.   MRN: [id number]  HPI Pt returns for f/u of diabetes mellitus: DM type: 1 Dx'ed: 5366 Complications: polyneuropathy and retinopathy.  Therapy: insulin since dx, pump rx since 2017.  DKA: never Severe hypoglycemia: never Pancreatitis: never Other: therapy has been limited by chronic noncompliance; she has had TAH;  She declines continuous glucose monitor.   Interval history: she has been off pump x approx 2-3 months.  She takes multiple daily injections.  Meter is downloaded today, and the printout is scanned into the record.  cbg varies from 65-400.  There is no trend throughout the day.  She says lantus is just 12 units/d Past Medical History:  Diagnosis Date  . Anxiety state, unspecified 03/26/2009  . ASTHMA 02/02/2007  . Asthma   . DEPRESSION 02/16/2009  . DIABETES MELLITUS, TYPE I 02/02/2007  . Endometriosis   . GERD 12/21/2008  . Herpes    at 18  . Hyperlipidemia   . Lesion of vocal cord   . Osteoarthritis   . Shortness of breath 09/25/2007  . Vocal fold leukoplakia     Past Surgical History:  Procedure Laterality Date  . ABDOMINAL HYSTERECTOMY  2009   BSO  . DIRECT LARYNGOSCOPY N/A 08/17/2017   Procedure: DIRECT LARYNGOSCOPY WITH OPERATING TELESCOPE WITH BIOPSY;  Surgeon: Helayne Seminole, MD;  Location: [facility];  Service: ENT;  Laterality: N/A;  . NASOPHARYNGEAL BIOPSY Right 08/17/2017   Procedure: NASOPHARYNGEAL BIOPSY RIGHT NASAL LESION;  Surgeon: Helayne Seminole, MD;  Location: [facility];  Service: ENT;  Laterality: Right;    Social History   Socioeconomic History  . Marital status: Legally Separated    Spouse name: Not on file  . Number of children: 2  . Years of education: Not on file  . Highest education level: Not on file  Occupational History    Employer: UNEMPLOYED  Tobacco Use  . Smoking status: Current Every Day Smoker    Packs/day: 1.50    Years: 25.00    Pack years:  37.50    Types: Cigarettes  . Smokeless tobacco: Never Used  . Tobacco comment: 1PPD as of 07/2018  Vaping Use  . Vaping Use: Former  . Quit date: 02/23/2017  . Devices: only used tobacco   Substance and Sexual Activity  . Alcohol use: Not Currently    Alcohol/week: 0.0 standard drinks  . Drug use: No  . Sexual activity: Yes    Partners: Male    Birth control/protection: None    Comment: 1st intercourse-14, partners- 84, current partner- 4 months  Other Topics Concern  . Not on file  Social History Narrative   2 children--pt has custody. Husband pays child support.   Daily Caffeine Use-3 2 liters a day   Pt does not get regular exercise.   Social Determinants of Health   Financial Resource Strain:   . Difficulty of Paying Living Expenses:   Food Insecurity:   . Worried About Charity fundraiser in the Last Year:   . Arboriculturist in the Last Year:   Transportation Needs:   . Film/video editor (Medical):   Marland Kitchen Lack of Transportation (Non-Medical):   Physical Activity:   . Days of Exercise per Week:   . Minutes of Exercise per Session:   Stress:   . Feeling of Stress :   Social Connections:   . Frequency of Communication with  Friends and Family:   . Frequency of Social Gatherings with Friends and Family:   . Attends Religious Services:   . Active Member of Clubs or Organizations:   . Attends Archivist Meetings:   Marland Kitchen Marital Status:   Intimate Partner Violence:   . Fear of Current or Ex-Partner:   . Emotionally Abused:   Marland Kitchen Physically Abused:   . Sexually Abused:     Current Outpatient Medications on File Prior to Visit  Medication Sig Dispense Refill  . ACCU-CHEK GUIDE test strip 1 EACH BY OTHER ROUTE 4 (FOUR) TIMES DAILY. E11.9 200 strip 0  . Accu-Chek Softclix Lancets lancets 1 each by Other route 4 (four) times daily. E11.9 360 each 0  . albuterol (PROVENTIL HFA;VENTOLIN HFA) 108 (90 Base) MCG/ACT inhaler Inhale 2 puffs into the lungs every 6  (six) hours as needed for wheezing or shortness of breath. 1 Inhaler 2  . ALPRAZolam (XANAX) 1 MG tablet Take 1 mg by mouth 4 (four) times daily.    Marland Kitchen amLODipine (NORVASC) 5 MG tablet Take 1 tablet (5 mg total) by mouth daily. 90 tablet 3  . atorvastatin (LIPITOR) 10 MG tablet Take 1 tablet (10 mg total) by mouth at bedtime. 90 tablet 1  . B-D UF III MINI PEN NEEDLES 31G X 5 MM MISC 4 (four) times daily.    . Blood Glucose Monitoring Suppl (ACCU-CHEK GUIDE ME) w/Device KIT 1 each by Does not apply route 4 (four) times daily. E11.9 1 kit 0  . Cholecalciferol (VITAMIN D3) 50 MCG (2000 UT) capsule Take by mouth daily.     . ciprofloxacin (CIPRO) 500 MG tablet Take 500 mg by mouth 2 (two) times daily. Take 1 tablet by mouth twice daily for 7 days.    Marland Kitchen gabapentin (NEURONTIN) 300 MG capsule Take 1 capsule (300 mg total) by mouth 3 (three) times daily. As needed 270 capsule 1  . Ibuprofen (ADVIL PO) Take 2-3 tablets by mouth every 6 (six) hours as needed.    . meclizine (ANTIVERT) 25 MG tablet Take 1 tablet (25 mg total) by mouth 3 (three) times daily as needed for dizziness. 30 tablet 0  . metoCLOPramide (REGLAN) 5 MG tablet Take 1 tablet (5 mg total) by mouth 3 (three) times daily before meals. 90 tablet 0  . Multiple Vitamin (MULTIVITAMIN WITH MINERALS) TABS tablet Take 1 tablet by mouth daily.    Marland Kitchen omeprazole (PRILOSEC) 40 MG capsule Take 1 capsule (40 mg total) by mouth in the morning and at bedtime. 60 capsule 3  . oxyCODONE-acetaminophen (PERCOCET) 10-325 MG tablet SMARTSIG:1 Pill By Mouth 1 to 4 Times Daily PRN    . PARoxetine (PAXIL) 20 MG tablet Take 10 mg by mouth daily. Take 1/2 tablet (20m total) by mouth once daily.    .Marland KitchenrOPINIRole (REQUIP) 1 MG tablet Take 1qhs (Plz sched appt with new provider) 90 tablet 0  . valACYclovir (VALTREX) 500 MG tablet Take 1 tablet (500 mg total) by mouth 2 (two) times daily. (Patient taking differently: Take 500 mg by mouth as needed. ) 10 tablet 3  . zinc  gluconate 50 MG tablet Take 50 mg by mouth daily.     No current facility-administered medications on file prior to visit.    No Known Allergies  Family History  Problem Relation Age of Onset  . Diabetes Mother   . Diabetes Maternal Aunt   . Emphysema Maternal Aunt   . Emphysema Maternal Uncle   . Diabetes  Maternal Grandfather   . Lung cancer Maternal Aunt   . Colon cancer Neg Hx   . Kidney disease Neg Hx   . Liver disease Neg Hx   . Esophageal cancer Neg Hx   . Rectal cancer Neg Hx   . Stomach cancer Neg Hx     BP 124/70 (BP Location: Left Arm, Patient Position: Sitting, Cuff Size: Normal)   Pulse (!) 120   Ht _0  (1.626 m)   Wt 124 lb 12.8 oz (56.6 kg)   SpO2 98%   BMI 21.42 kg/m    Review of Systems Denies LOC    Objective:   Physical Exam VITAL SIGNS:  See vs page GENERAL: no distress Pulses: dorsalis pedis intact bilat.   MSK: no deformity of the feet CV: no leg edema Skin:  no ulcer on the feet.  normal color and temp on the feet. Neuro: sensation is intact to touch on the feet  Lab Results  Component Value Date   HGBA1C 8.8 (A) 01/05/2020       Assessment & Plan:  Type 1 DM: she needs increased rx.  main problem now is getting new pump approved.   Hypoglycemia, due to insulin: this limits aggressiveness of glycemic control.  She declines to change current multiple daily injections schedule now.   Patient Instructions  Here are copies of the papers we filled out for your new pump.  When you get it, please see Vaughan Basta, to get started on it. Please continue the same insulins.   check your blood sugar 4 times a day: before the 3 meals, and at bedtime.  also check if you have symptoms of your blood sugar being too high or too low.  please keep a record of the readings and bring it to your next appointment here (or you can bring the meter itself).  You can write it on any piece of paper.  please call us sooner if your blood sugar goes below 70, or if you  have a lot of readings over 200.  Please come back for a follow-up appointment in 2 months.

## 2020-01-13 ENCOUNTER — Telehealth: Payer: Self-pay

## 2020-01-13 ENCOUNTER — Other Ambulatory Visit: Payer: Self-pay

## 2020-01-13 DIAGNOSIS — IMO0002 Reserved for concepts with insufficient information to code with codable children: Secondary | ICD-10-CM

## 2020-01-13 MED ORDER — DEXCOM G6 TRANSMITTER MISC: 1.0000 | 0 refills | Status: DC

## 2020-01-13 MED ORDER — DEXCOM G6 SENSOR MISC: 1.0000 | 2 refills | Status: DC

## 2020-01-13 NOTE — Telephone Encounter (Signed)
At the request of Diabetic Nurse Educator, Leonia Reader RN, new Rx for Dexcom G6 sensors and transmitter sent to [facility].

## 2020-01-13 NOTE — Telephone Encounter (Signed)
-----   Message from Ocie Doyne, RN sent at 01/13/2020 12:09 PM EDT ----- Hi Ocie Stanzione, I am starting her on her Tandem pump on 7/13.  She does not have the Dexcom.  [employer] is not able to get it for her.  I called [employer].  They said just fax over the order for Dexcom G6 sensors and transmitter, and they will get it to a supplier, that can get it for her.  I will give her a sample, and hopefully she will have them before the 10 days is up.   Thank you.

## 2020-01-14 ENCOUNTER — Telehealth: Payer: Self-pay | Admitting: Pulmonary Disease

## 2020-01-14 ENCOUNTER — Ambulatory Visit: Payer: Medicaid Other | Admitting: Endocrinology

## 2020-01-14 NOTE — Telephone Encounter (Signed)
She was taking Symbicort 80-4.5.  Thank you

## 2020-01-14 NOTE — Telephone Encounter (Signed)
Spoke with patient, she is wanting to go back on her Symbicort d/t wheezing and having to use her Albuterol 3-4 times per day.  She states she stopped taking it because it make her shaky and did not feel it was working, but read that it takes some time to work.  She is currently on pain medication and xanax and wants to make sure they will not interact with the Symbicort.  Please advise if you want the Symbicort ordered.

## 2020-01-15 MED ORDER — BUDESONIDE-FORMOTEROL FUMARATE 80-4.5 MCG/ACT IN AERO
2.0000 | INHALATION_SPRAY | Freq: Two times a day (BID) | RESPIRATORY_TRACT | 11 refills | Status: DC
Start: 2020-01-15 — End: 2020-02-02

## 2020-01-15 NOTE — Telephone Encounter (Signed)
Spoke with patient, relayed information to patient from Dr. Carlis Abbott.  Verified pharmacy and script for Symbicort sent.  Answered patient's questions regarding inhaler.  Nothing further needed.

## 2020-01-15 NOTE — Telephone Encounter (Signed)
I am fine with her going back on it. If anything, I worry more about pain medications and xanax potentially interacting with each other, but the inhaler is fine to use with both of those medications.  Julian Hy, DO 01/15/20 7:07 AM [facility]

## 2020-01-20 ENCOUNTER — Telehealth: Payer: Self-pay

## 2020-01-20 NOTE — Telephone Encounter (Signed)
PRIOR AUTHORIZATION  PA initiation date: 01/20/20  CGM: [place]: [employer] completed electronically through [employer] My Meds: No PA form completed and faxed to [employer]: Yes  Will await insurance response re: approval/denial.  NOTE - Also faxed proof of PA initiation to [employer].

## 2020-01-20 NOTE — Telephone Encounter (Signed)
Opened in error

## 2020-01-23 ENCOUNTER — Other Ambulatory Visit: Payer: Self-pay

## 2020-01-23 DIAGNOSIS — IMO0002 Reserved for concepts with insufficient information to code with codable children: Secondary | ICD-10-CM

## 2020-01-23 MED ORDER — DEXCOM G6 TRANSMITTER MISC: 1.0000 | 0 refills | Status: DC

## 2020-01-23 MED ORDER — DEXCOM G6 SENSOR MISC: 1.0000 | 2 refills | Status: DC

## 2020-01-23 NOTE — Telephone Encounter (Signed)
APPROVAL  CGM: [place]: [place] [employer] PA response: APPROVED  [facility]. Document have been labeled and placed in scan file for HIM and for our future reference.

## 2020-01-25 ENCOUNTER — Other Ambulatory Visit: Payer: Self-pay | Admitting: Family Medicine

## 2020-01-27 ENCOUNTER — Encounter: Payer: Medicaid Other | Attending: Internal Medicine | Admitting: Nutrition

## 2020-01-27 DIAGNOSIS — IMO0002 Reserved for concepts with insufficient information to code with codable children: Secondary | ICD-10-CM

## 2020-01-27 DIAGNOSIS — E1049 Type 1 diabetes mellitus with other diabetic neurological complication: Secondary | ICD-10-CM | POA: Insufficient documentation

## 2020-01-27 DIAGNOSIS — E1065 Type 1 diabetes mellitus with hyperglycemia: Secondary | ICD-10-CM | POA: Insufficient documentation

## 2020-01-28 ENCOUNTER — Telehealth: Payer: Self-pay | Admitting: Nutrition

## 2020-01-28 NOTE — Patient Instructions (Signed)
Read over manual and handouts given on how to use this pump.   Call Tandem help line if questions about how to use this pump.

## 2020-01-28 NOTE — Progress Notes (Signed)
[name] had forgotten and taken her Lantus insulin this AM.  She is very afraid of low low blood sugars, and did not want to start this until tomorrow. We agreed to set this up today and fill with insulin, but to not to attach the infusion set until tomorrow morning at 7AM.  She reports that she is willing to take meal time insulin, but does not want to count carbs.  Discussed the importance of counting carbs, but she says she needs to feel more comforatble using this new pump before learning something else Settings were put into pump by patient as per her old [place] pump, when she was bolusing for meals.    Basal rate: MN: 0.5, I/C: 1  (she will put in units of insulin into the grams section of the bolus calculator), ISF: 100, target: 120.  She has agreed to learn how to count carbs next month after her return visit with Dr. [name] in 2 weeks.  She eats 2 sausage biscuits and hash browns every morning and takes 5u for this.  Says blood sugars acL are usually around 140.   She was instructed on all topics of how to use this pump.  She filled a cartridge with Novolog insulin, and attached a 33mm soft set into her left abdomen.  She disconnected the infusion set from her abdomen, so is not getting any basal insulin.  She will reattach the infusion set for boluses at supper tonight, disconnect the pump after bolusing, and restart this tomorrow morning at 7AM.  We reviewed the need for boluses, and how to do this, as well as IOB, and she reported good understanding of this. She was instructed/started on the Dexcom, started this, and linked this to to her pump, as well as [facility].  We turned Control IQ on, and it was explained to her how this will work.  She reported good understanding of this.   Handouts were given for how to: Change settings, give a bolus, start/stop pump, view status screen, symbols for Control IQ, changing a cartridge/infusion set, and how to link Dexcom transmitter to pump.   She signed  off the the checklist as understanding all topics and had no final questions.   She will be going to [employer] on Thursday of this week.  We discussed how to put the pump in a temp basal rate, and was given a handout for this.  She says that she prefers to disconnect from the pump for 2 hours as instructed by Dr. [name], and will do this as needed for extended exercise.  Reminded her of the need to keep track of blood sugar readings of phone/pump during this time.  She agreed to do this.

## 2020-01-28 NOTE — Telephone Encounter (Signed)
Patient reported no difficulty bolusing for late lunch and supper today.  Says Dexcom started working after 2 hour warm up, and is sending reading to the pump.  Blood sugar acS was119, and she was pleased with this.  She had no questions for me at this time.

## 2020-01-29 ENCOUNTER — Telehealth: Payer: Self-pay | Admitting: Nutrition

## 2020-01-29 ENCOUNTER — Telehealth: Payer: Self-pay | Admitting: Internal Medicine

## 2020-01-29 ENCOUNTER — Telehealth: Payer: Self-pay | Admitting: Primary Care

## 2020-01-29 NOTE — Telephone Encounter (Signed)
Patient reports that she started her pump this morning at 8AM.  She bolused for her 2 biscuits and hash browns-taking 5u and blood sugars dropped low at 11. She then took off the pump for 4 hours and blood sugar was 142 at 3PM  She also increased her ISF to 150, saying that it told her to take too much insulin to drop her blood sugar 32 points.  I explained that her basal rate is lower than her Lantus dose, and that she will need more insulin for corrections.    Her pump stopped the flow of insulin when she dropped low, but she said that she reduced her basal rate to 0.4 and already had reduced her bolus for that meal by 1u.   She was told to call Dr. Cordelia Pen office with this information, and she said she would do this.  She was told to please not adjust doses until after seeing him in 2 weeks, unless speaking to him first.  She is going on vacation Friday, and was encouraged to call the office before then with tomorrow's blood sugar readings.  She agreed to do this.

## 2020-01-29 NOTE — Telephone Encounter (Signed)
Spoke with pt, she is reporting elevated bp and pulse rate over the last couple weeks. She reports rates from 94 to 104 walking from the porch to the den. She is anxious and reports being under a lot of stress. She checks her pulse with her bp cuff. She is drinking a lot of diet cokes and explained to patient the caffeine can elevate her heart rate as well as dehydrate her and that can cause her pulse to be elevated as well. Explained to patient she needs to change to decaffeinated drinks and drink more water. She reports dr Margaretann Loveless was going to start her on a different medication but she is not sure what that is. Aware dr Margaretann Loveless out of the office but will forward to her for her review and advise.

## 2020-01-29 NOTE — Telephone Encounter (Signed)
   STAT if HR is under 50 or over 120 (normal HR is 60-100 beats per minute)  1) What is your heart rate? 94-109  2) Do you have a log of your heart rate readings (document readings)?   3) Do you have any other symptoms? Pt said he HR been running high even though she is resting. She said she's been stress lately and doesn't know if that causing to elevate her HR. She also gave recent BP 150/86, 138/66, 126/89 with a HR 109. She is concerned and would like to speak with a nurse to discuss

## 2020-01-29 NOTE — Telephone Encounter (Signed)
Called and spoke with pt who stated she stopped taking the Symbicort 01/25/20 due to her BP elevating and also making her very shaky. Pt stated when this happened, this was the same that happened the first time when she was on the Symbicort but then it was stopped and then she decided to try it again to see if she could tolerate it. Pt stated the second time after trying the Symbicort was worse than the first time.  Pt wants to know if there is a different inhaler that she might be able to try in place of Symbicort as she is unable to tolerate Symbicort. Dr. Halford Chessman, please advise.

## 2020-01-30 ENCOUNTER — Other Ambulatory Visit: Payer: Self-pay | Admitting: Endocrinology

## 2020-01-30 DIAGNOSIS — IMO0002 Reserved for concepts with insufficient information to code with codable children: Secondary | ICD-10-CM

## 2020-01-31 NOTE — Telephone Encounter (Signed)
Can try changing her to flovent 110 mcg two puffs bid.  Please d/c symbicort from her medication list.

## 2020-02-02 MED ORDER — FLOVENT HFA 110 MCG/ACT IN AERO
2.0000 | INHALATION_SPRAY | Freq: Two times a day (BID) | RESPIRATORY_TRACT | 12 refills | Status: DC
Start: 2020-02-02 — End: 2020-02-26

## 2020-02-02 NOTE — Telephone Encounter (Signed)
Called and spoke with pt letting her know that VS wants to switch her from symbicort to flovent and she verbalized understanding. Verified pt's  Preferred pharmacy and sent Rx to pharmacy for pt. Nothing further needed.

## 2020-02-03 ENCOUNTER — Telehealth: Payer: Self-pay | Admitting: Nutrition

## 2020-02-03 NOTE — Telephone Encounter (Signed)
Patient reports that she is at [facility], walking all day, and pump is dropping her blood sugars all days and night.  She has reduced her basal rate to 0.2u/hr and sensitivity to "600" , and has reduced her meal time insulin to 1u, and is still having to remove the pump for several hours at a time.  She is leaving tomorrow.  I told her that she needs to see Dr. [name] next week, and she agreed to call and make an appointment.

## 2020-02-06 ENCOUNTER — Ambulatory Visit: Payer: Medicaid Other | Admitting: Primary Care

## 2020-02-08 ENCOUNTER — Other Ambulatory Visit: Payer: Self-pay | Admitting: Endocrinology

## 2020-02-10 ENCOUNTER — Other Ambulatory Visit: Payer: Self-pay | Admitting: Endocrinology

## 2020-02-10 DIAGNOSIS — IMO0002 Reserved for concepts with insufficient information to code with codable children: Secondary | ICD-10-CM

## 2020-02-11 ENCOUNTER — Other Ambulatory Visit: Payer: Self-pay

## 2020-02-11 ENCOUNTER — Other Ambulatory Visit: Payer: Self-pay | Admitting: Family Medicine

## 2020-02-11 MED ORDER — OMEPRAZOLE 40 MG PO CPDR: 40.0000 mg | DELAYED_RELEASE_CAPSULE | Freq: Two times a day (BID) | ORAL | 1 refills | Status: DC

## 2020-02-11 NOTE — Progress Notes (Signed)
90 day script sent. EGD in 09-2019

## 2020-02-12 ENCOUNTER — Other Ambulatory Visit: Payer: Self-pay | Admitting: Family Medicine

## 2020-02-17 ENCOUNTER — Encounter: Payer: Self-pay | Admitting: Neurology

## 2020-02-18 ENCOUNTER — Telehealth: Payer: Self-pay | Admitting: Internal Medicine

## 2020-02-18 ENCOUNTER — Ambulatory Visit (INDEPENDENT_AMBULATORY_CARE_PROVIDER_SITE_OTHER): Payer: Medicaid Other | Admitting: Primary Care

## 2020-02-18 ENCOUNTER — Telehealth: Payer: Self-pay | Admitting: Primary Care

## 2020-02-18 ENCOUNTER — Encounter: Payer: Self-pay | Admitting: Primary Care

## 2020-02-18 DIAGNOSIS — J4520 Mild intermittent asthma, uncomplicated: Secondary | ICD-10-CM

## 2020-02-18 MED ORDER — LEVALBUTEROL TARTRATE 45 MCG/ACT IN AERO: 1.0000 | INHALATION_SPRAY | Freq: Three times a day (TID) | RESPIRATORY_TRACT | 3 refills | Status: DC

## 2020-02-18 NOTE — Telephone Encounter (Signed)
Patient states that son was in [place] and can't leave him and would like to make appointment this afternoon a TELEVISIT. Checked with Beth and she said this was fine. Patient is aware. Nothing further needed at this time.

## 2020-02-18 NOTE — Patient Instructions (Signed)
Recommendations: Continue Flovent 2 puffs twice daily  Rx: Levalbuterol 1-2 puffs three times a day  Follow-up: 3 months with Dr. [name]

## 2020-02-18 NOTE — Progress Notes (Deleted)
@Patient  ID: [name], female    DOB: 29-Jun-1976, 44 y.o.   MRN: [id number]  No chief complaint on file.   Referring provider: Tamsen Roers, MD  HPI: 44 year old female, current every day smoker. PMH significant for OSA. Patient of Dr. [name], last seen by pulmonary NP in July 2020. Sleep study showed mild sleep apnea, AHI 6.7/hr with SpO2 low 81%.  Started on CPAP therapy.  02/18/2020 Patient contacted today by telephone for follow-up visit. She resumed Symbicort 80 in June 2021 d/t wheezing and increased SABA use.    No Known Allergies  Immunization History  Administered Date(s) Administered  . Influenza Split 04/11/2012  . Influenza,inj,Quad PF,6+ Mos 08/02/2018  . Td 07/19/1999    Past Medical History:  Diagnosis Date  . Anxiety state, unspecified 03/26/2009  . ASTHMA 02/02/2007  . Asthma   . DEPRESSION 02/16/2009  . DIABETES MELLITUS, TYPE I 02/02/2007  . Endometriosis   . GERD 12/21/2008  . Herpes    at 18  . Hyperlipidemia   . Lesion of vocal cord   . Osteoarthritis   . Shortness of breath 09/25/2007  . Vocal fold leukoplakia     Tobacco History: Social History   Tobacco Use  Smoking Status Current Every Day Smoker  . Packs/day: 1.50  . Years: 25.00  . Pack years: 37.50  . Types: Cigarettes  Smokeless Tobacco Never Used  Tobacco Comment   1PPD as of 07/2018   Ready to quit: Not Answered Counseling given: Not Answered Comment: 1PPD as of 07/2018   Outpatient Medications Prior to Visit  Medication Sig Dispense Refill  . albuterol (PROVENTIL HFA;VENTOLIN HFA) 108 (90 Base) MCG/ACT inhaler Inhale 2 puffs into the lungs every 6 (six) hours as needed for wheezing or shortness of breath. 1 Inhaler 2  . ALPRAZolam (XANAX) 1 MG tablet Take 1 mg by mouth 4 (four) times daily.    Marland Kitchen amLODipine (NORVASC) 5 MG tablet Take 1 tablet (5 mg total) by mouth daily. 90 tablet 3  . atorvastatin (LIPITOR) 10 MG tablet Take 1 tablet (10 mg total) by mouth at  bedtime. 90 tablet 1  . B-D UF III MINI PEN NEEDLES 31G X 5 MM MISC 4 (four) times daily.    . Cholecalciferol (VITAMIN D3) 50 MCG (2000 UT) capsule Take by mouth daily.     . ciprofloxacin (CIPRO) 500 MG tablet Take 500 mg by mouth 2 (two) times daily. Take 1 tablet by mouth twice daily for 7 days.    . Continuous Blood Gluc Sensor (DEXCOM G6 SENSOR) MISC 1 each by Does not apply route See admin instructions. Change sensor every 10 days; E11.9 3 each 2  . Continuous Blood Gluc Transmit (DEXCOM G6 TRANSMITTER) MISC 1 EACH BY DOES NOT APPLY ROUTE SEE ADMIN INSTRUCTIONS. FOR CONTINUOUS GLUCOSE MONITORING E11.9 1 each 0  . fluticasone (FLOVENT HFA) 110 MCG/ACT inhaler Inhale 2 puffs into the lungs in the morning and at bedtime. 1 Inhaler 12  . gabapentin (NEURONTIN) 300 MG capsule Take 1 capsule (300 mg total) by mouth 3 (three) times daily. As needed 270 capsule 1  . glucagon 1 MG injection Inject 1 mg into the muscle once as needed. 1 each 12  . HUMALOG KWIKPEN 100 UNIT/ML KwikPen Inject 0.1 mLs (10 Units total) into the skin 4 (four) times daily. 15 mL 0  . Ibuprofen (ADVIL PO) Take 2-3 tablets by mouth every 6 (six) hours as needed.    . insulin aspart (  NOVOLOG) 100 UNIT/ML injection USE IN INSULIN PUMP UP TO 40 UNITS DAILY 10 mL 0  . LANTUS SOLOSTAR 100 UNIT/ML Solostar Pen Inject 12 Units into the skin daily. 15 mL 0  . meclizine (ANTIVERT) 25 MG tablet Take 1 tablet (25 mg total) by mouth 3 (three) times daily as needed for dizziness. 30 tablet 0  . metoCLOPramide (REGLAN) 5 MG tablet Take 1 tablet (5 mg total) by mouth 3 (three) times daily before meals. 90 tablet 0  . Multiple Vitamin (MULTIVITAMIN WITH MINERALS) TABS tablet Take 1 tablet by mouth daily.    Marland Kitchen omeprazole (PRILOSEC) 40 MG capsule Take 1 capsule (40 mg total) by mouth in the morning and at bedtime. 180 capsule 1  . oxyCODONE-acetaminophen (PERCOCET) 10-325 MG tablet SMARTSIG:1 Pill By Mouth 1 to 4 Times Daily PRN    . PARoxetine  (PAXIL) 20 MG tablet Take 10 mg by mouth daily. Take 1/2 tablet (10mg  total) by mouth once daily.    Marland Kitchen rOPINIRole (REQUIP) 1 MG tablet TAKE 1 TABLET BY MOUTH AT BEDTIME (PLZ SCHED APPT WITH NEW PROVIDER)  Please make appointment with Dr. Rex Kras.                 Needs to make appointment with Dr. Rex Kras. 90 tablet 0  . valACYclovir (VALTREX) 500 MG tablet Take 1 tablet (500 mg total) by mouth 2 (two) times daily. (Patient taking differently: Take 500 mg by mouth as needed. ) 10 tablet 3  . zinc gluconate 50 MG tablet Take 50 mg by mouth daily.     No facility-administered medications prior to visit.      Review of Systems  Review of Systems   Physical Exam  There were no vitals taken for this visit. Physical Exam   Lab Results:  CBC    Component Value Date/Time   WBC 5.9 09/18/2018 1123   RBC 4.75 09/18/2018 1123   HGB 14.9 09/18/2018 1123   HGB 13.9 02/08/2014 2221   HCT 43.4 09/18/2018 1123   HCT 41.0 02/08/2014 2221   PLT 202.0 09/18/2018 1123   PLT 181 02/08/2014 2221   MCV 91.3 09/18/2018 1123   MCV 92 02/08/2014 2221   MCH 31.2 05/31/2018 1225   MCHC 34.2 09/18/2018 1123   RDW 12.9 09/18/2018 1123   RDW 13.2 02/08/2014 2221   LYMPHSABS 1.3 09/18/2018 1123   LYMPHSABS 2.0 02/08/2014 2221   MONOABS 0.4 09/18/2018 1123   MONOABS 0.8 02/08/2014 2221   EOSABS 0.0 09/18/2018 1123   EOSABS 0.1 02/08/2014 2221   BASOSABS 0.0 09/18/2018 1123   BASOSABS 0.1 02/08/2014 2221    BMET    Component Value Date/Time   NA 134 (L) 09/18/2018 1123   NA 127 (L) 02/08/2014 2221   K 4.1 09/18/2018 1123   K 3.2 (L) 02/08/2014 2221   CL 100 09/18/2018 1123   CL 95 (L) 02/08/2014 2221   CO2 28 09/18/2018 1123   CO2 25 02/08/2014 2221   GLUCOSE 225 (H) 09/18/2018 1123   GLUCOSE 131 (H) 02/08/2014 2221   BUN 7 09/18/2018 1123   BUN 8 02/08/2014 2221   CREATININE 0.80 09/11/2019 1438   CREATININE 0.67 02/08/2014 2221   CALCIUM 9.4 09/18/2018 1123   CALCIUM  8.9 02/08/2014 2221   GFRNONAA >60 05/31/2018 1225   GFRNONAA >60 02/08/2014 2221   GFRAA >60 05/31/2018 1225   GFRAA >60 02/08/2014 2221    BNP    Component Value Date/Time   BNP 40.0  05/12/2016 1616    ProBNP No results found for: PROBNP  Imaging: No results found.   Assessment & Plan:   No problem-specific Assessment & Plan notes found for this encounter.     Martyn Ehrich, NP 02/18/2020

## 2020-02-18 NOTE — Telephone Encounter (Signed)
Spoke to pt who report her BP still remains elevated. Pt state she doesn't have her readings with her but the machine is reading in the orange. She report red is the highest color and prior to new medication it stayed in the red. Pt also report HR has been averaging between 98-100. Pt voiced she occasionallyl have headaches and the past 3 morning expect today, have difficult seeing. Pt denies symptoms currently.  Will route to MD for recommendations.

## 2020-02-18 NOTE — Progress Notes (Signed)
Virtual Visit via Telephone Note  I connected with [name] on 02/18/20 at  2:30 PM EDT by telephone and verified that I am speaking with the correct person using two identifiers.  Location: Patient: Home Provider: Office   I discussed the limitations, risks, security and privacy concerns of performing an evaluation and management service by telephone and the availability of in person appointments. I also discussed with the patient that there may be a patient responsible charge related to this service. The patient expressed understanding and agreed to proceed.   History of Present Illness: 44 year old female, current every day smoker. PMH significant for OSA. Patient of Dr. [name], last seen by pulmonary NP in July 2020. Sleep study showed mild sleep apnea, AHI 6.7/hr with SpO2 low 81%.  Started on CPAP therapy.  02/18/2020 Patient contacted today by telephone for follow-up visit. She resumed Symbicort in June 2021 d/t wheezing and increased SABA use. She then stopped taking Symbicort d/t elevated BP. She was sent in prescription for Flovent 143mcg 2 puffs twice daily. States flovent has bee helping but not as good as Symbicort. She is working on getting her blood pressure down with cardiology. She has BP cuff at home and her blood pressure was in the red zone. When she stopped Symbicort her BP came down to yellow/orange. She has not been wearing her CPAP, diff tolerating and reported no benefit from use. She has very mild OSA. Encourage side sleeping position.   [facility] download 01/18/20-02/16/20: Usage 4/30 (13%) days used Average usage days used 6 hours 28 mins Pressure 5-15cm h20 AHI 0.2   Observations/Objective:  - Able to speak in full sentences; no overt shortness of breath or wheezing  Assessment and Plan:  Mild intermittent Asthma: - Intolerant to ICS/LABA d/t hypertension  - Continue Flovent 2 puffs twice daily; Levalbuterol 1-2 puffs three times a day  Mild OSA: -  Intolerant to CPAP - Continue to encourage weight loss and side sleeping position   Follow Up Instructions:  - 3 month follow-up with Dr. [name]   I discussed the assessment and treatment plan with the patient. The patient was provided an opportunity to ask questions and all were answered. The patient agreed with the plan and demonstrated an understanding of the instructions.   The patient was advised to call back or seek an in-person evaluation if the symptoms worsen or if the condition fails to improve as anticipated.  I provided 18 minutes of non-face-to-face time during this encounter.   Martyn Ehrich, NP

## 2020-02-18 NOTE — Telephone Encounter (Signed)
Spoke to patient she wanted Dr.Acharya to know her B/P continues to be elevated ranging 133/80 p-102,132/87 p-103,135/81 Z-968,864/84 p-98,138/77 p-102,141/87 p-101.Stated her heart will race for no reason for the past 2 months.Stated Dr.Acharya had ordered a test to be done in [place] but unable to have done due to elevated B/P.Appointment scheduled with Dr.Acharya 8/30 at 2:00 pm.She wanted to know if test needs to be rescheduled before her appointment.She also wanted to know if she needs to prescribe additional B/P medication.Message sent to [place] for advice.

## 2020-02-18 NOTE — Telephone Encounter (Signed)
Pt stated that her heart beats fast for no reason. Forgot to mention that from conversation with nurse this morning. Please call back

## 2020-02-18 NOTE — Telephone Encounter (Signed)
Pt c/o BP issue: STAT if pt c/o blurred vision, one-sided weakness or slurred speech  1. What are your last 5 BP readings? Patient states she does not have any BP readings available.  2. Are you having any other symptoms (ex. Dizziness, headache, blurred vision, passed out)? No  3. What is your BP issue? Patient is calling to follow up with Dr. Margaretann Loveless regarding her BP. She states her BP has still been elevated, however, no readings are available. She would like to be worked into Dr. Delphina Cahill schedule as soon as possible.

## 2020-02-24 NOTE — Telephone Encounter (Signed)
Her diabetes control will need to be addressed with her PCP, and may be a significant driver of her symptoms since her BP readings are not severely elevated.  She should increase her amlodipine to 10 mg a day and we will reevaluate at our upcoming follow up.   She would benefit greatly from a visit with our pharmacist. Please have her set up to see [facility] clinic to additional management and medication review.

## 2020-02-24 NOTE — Telephone Encounter (Signed)
Patient called in to office regarding her blood pressure and heart rate. Patient states that her last 5 previous blood pressures have been as follows:   147/72 HR 110 144/82 HR 94 127/80 HR 83 158/93 HR 90 137/88 HR 91   Patient states that the last few days she has had headaches, feeling shaky, intermittent nausea and worsening blurry vision through out the day. Patient states that she feels like some one has shined a light in to her eyes when she first wakes up in the mornings and intermittent blurry vision and sensitivity to light through out the day. Patient states that she started Amlodipine 5mg  after her last appointment with Dr. Margaretann Loveless and since then her blood pressure readings have went from being in the red zone on her blood pressure cuff which is the highest to now being in the yellow/orange zone which she reports is an improvement. Patient is concerned because of her continued headaches and blurry vision. Patient also states that she has palpitations through out the day with the highest heart rate reached being 110. Patient denies chest pain. Patient also reports that she has recently switched insulin pumps and her blood sugars have been in the 200's and she is currently trying to get that under control. Patient informed that she may need to follow up with her PCP regarding some of these issues. Patient is scheduled to see Dr. Margaretann Loveless August 30 th at 2:20 pm. Patient informed that this message would be forwarded to Dr. Margaretann Loveless for further advisement.   Patient informed that if symptoms were to worsen then she needed to go to the emergency department for evaluation.

## 2020-02-24 NOTE — Telephone Encounter (Signed)
Pt c/o BP issue: STAT if pt c/o blurred vision, one-sided weakness or slurred speech  1. What are your last 5 BP readings?  147/72 HR 110 144/82 HR 94 127/80 HR 83 158/93 HR 90 137/88 HR 91  2. Are you having any other symptoms (ex. Dizziness, headache, blurred vision, passed out)? Patient states she feels jittery, and shaky then she checks her BP.  She gets headaches, feels nauseous.  When she first wakes up in the morning she has blurry vision, more like a light has been shined in her eyes.   3. What is your BP issue? States her BP is still elevated.

## 2020-02-25 MED ORDER — AMLODIPINE BESYLATE 10 MG PO TABS: 10.0000 mg | ORAL_TABLET | Freq: Every day | ORAL | 0 refills | Status: DC

## 2020-02-25 NOTE — Telephone Encounter (Signed)
Called and scheduled an appointmednt for htn with kristen pharmd

## 2020-02-25 NOTE — Telephone Encounter (Signed)
Called patient regarding Dr. Hamilton Capri recommendations. Patient aware of Appointment with PharmD- on August 18th to address HTN and Medications. Patient will bring blood pressure cuff for coorelation. Spoke with patient about also increasing her Amlodipine to 10mg  once a day. This was called in to pharmacy. Patient verbalized understanding and instructions. Patient also advised to continue to monitor BP and to bring blood pressure readings to next appointment with Dr. Margaretann Loveless to follow up. Patient verbalized understanding of all instructions.

## 2020-02-25 NOTE — Addendum Note (Signed)
Addended by: Rexanne Mano B on: 02/25/2020 03:46 PM   Modules accepted: Orders

## 2020-02-26 ENCOUNTER — Telehealth: Payer: Self-pay | Admitting: Pulmonary Disease

## 2020-02-26 MED ORDER — FLOVENT HFA 44 MCG/ACT IN AERO
1.0000 | INHALATION_SPRAY | Freq: Two times a day (BID) | RESPIRATORY_TRACT | 5 refills | Status: DC
Start: 2020-02-26 — End: 2021-11-03

## 2020-02-26 NOTE — Telephone Encounter (Signed)
Called and spoke with pt letting her know the info stated by [facility]. Pt stated that she has tried doing 1 puff of the Flovent and she states that she feels like it is too strong for her. She would like to have a change in the inhaler.

## 2020-02-26 NOTE — Telephone Encounter (Signed)
Ok, sent

## 2020-02-26 NOTE — Telephone Encounter (Signed)
Called and spoke with pt who stated she has been on the [place] since 7/25 and stated she is having complaints of feeling like her heart is racing, elevated BP, being shaky, and also states that she is having a hard time trying to function. Pt stated this was the same way the Symbicort made her feel when she was on it.  Pt stated she had forgotten to take the [place] yesterday 8/11 and she stated she did not have any problems feeling jittery and also stated that she could tell that her heart was not racing like it had been. Due to this, pt also stated that she has not taken it today 8/12.  Pt wants to know if there is anything else that she might be able to be switched to as she is unable to function with the way she felt when on the Symbicort and now being on the Flovent. Beth, please advise.

## 2020-02-26 NOTE — Telephone Encounter (Signed)
Called and spoke with pt letting her know that [facility] sent Rx to pharmacy for her and she verbalized understanding. Nothing further needed.

## 2020-02-26 NOTE — Telephone Encounter (Signed)
Recommend decreasing to 1 puff twice daily if she has not tried this OR we could place a new order for low dose steriod inhaler flovent 72mcg 1-2 puffs twice daily

## 2020-02-27 ENCOUNTER — Telehealth: Payer: Self-pay | Admitting: Internal Medicine

## 2020-02-27 NOTE — Telephone Encounter (Signed)
New message:    Patient calling concering some medication issues. She is not sure what she should be taken.

## 2020-02-27 NOTE — Telephone Encounter (Signed)
Spoke with pt, they have decided that her elevated bp was due to her inhaler. They have reduced the dose of inhaler and they feel her bp with lower. She is wondering if she needs to take the 10 mg dose of amlodipine now or not. She does not know what her bp is running today. She will continue to take the 10 mg of amlodipine and check her bp. Orthostatic symptoms to watch for discussed with patient.

## 2020-03-03 ENCOUNTER — Other Ambulatory Visit: Payer: Self-pay

## 2020-03-03 ENCOUNTER — Ambulatory Visit (INDEPENDENT_AMBULATORY_CARE_PROVIDER_SITE_OTHER): Payer: Medicaid Other | Admitting: Pharmacist Clinician (PhC)/ Clinical Pharmacy Specialist

## 2020-03-03 DIAGNOSIS — I10 Essential (primary) hypertension: Secondary | ICD-10-CM

## 2020-03-03 HISTORY — DX: Essential (primary) hypertension: I10

## 2020-03-03 MED ORDER — LISINOPRIL 10 MG PO TABS
10.0000 mg | ORAL_TABLET | Freq: Every day | ORAL | 3 refills | Status: DC
Start: 2020-03-03 — End: 2020-07-21

## 2020-03-03 NOTE — Progress Notes (Signed)
03/03/2020 [name] 02/19/1976 [id number]   HPI:  [name] is a 44 y.o. female patient of Dr Margaretann Loveless, with a PMH below who presents today for hypertension clinic evaluation.  She was seen by Dr. Margaretann Loveless in June and found to have a blood pressure of 148/86.  At that time she was given amlodipine 5 mg daily.  After a few weeks she contacted the office because of ongoing hypertension issues (150/86, 138/66) with heart rates from 94-109.  She was advised to decrease her caffeine intake and increase amlodipine to 10 mg daily.  It has been about 1 week since her dose increase on the amlodipine.  She has concerns about having stopped ropinorole after 12 years, her new PCP declined to refill.  She has felt more shaky, but her restless leg symptoms have not yet returned.     Today she notes no side effects or issues with the 10 mg amlodipine dose.  She has been checking her BP at home occasionally.  She has a wrist monitor which will show a red, yellow or green light in addition to the reading.  She is quite worried, as the readings usually come with the red "high" light and she is worried about stroke.  She admits to a lot of stress in her life, as she and her husband are currently separated, her son was recently in a MVA, and a nephew passed away.  She continues to smoke, about 1.5 ppd and knows this is not good.     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   - retinopathy Retinal damage  Asthma Switched Symbicort to Flovent d/t elevated BP on Symbicort  hyperlipidemia no lipid panel on file- on atorvastatin 10 mg      Blood Pressure Goal:  130/80  Current Medications: amlodipine 10 mg  Family Hx: mother DM1, now 69, dx at 23, also htn, myasthenia gravis, retinal DM problems, almost blind; father 12 with htn, arthritis (osteo), kidney tumor, DM2;  2 brothers, one with DM1,  alcoholism; other with htn hld  (all with lipids);  2 children, son with hypoglycemia issues; 80, 9;   Social Hx: smokes 1.5 ppd; some caffeine in soda (mixes 1/2 and 1/2); no alcohol  Diet:  Mix; some fast food 2-3 times per week on average; does add some salt, has had hyponatremia  Exercise: no regular exercise  Home BP readings: last 7 readings: 133/88(100), 142/83(88)  143/103(99),  135/85(93),   143/96(79),  128/66(100),  146/86(108)  Average 139/87 (96)   Intolerances: nkda  Labs: 09/2018 Na 134, K 4.1 Glu 225, BUN 7, SCr 0.65  Wt Readings from Last 3 Encounters:  03/03/20 121 lb (54.9 kg)  01/05/20 124 lb 12.8 oz (56.6 kg)  12/31/19 126 lb 9.6 oz (57.4 kg)   BP Readings from Last 3 Encounters:  03/03/20 (!) 142/64  01/05/20 124/70  12/31/19 134/81   Pulse Readings from Last 3 Encounters:  03/03/20 70  01/05/20 (!) 120  12/31/19 (!) 108    Current Outpatient Medications  Medication Sig Dispense Refill   ALPRAZolam (XANAX) 1 MG tablet Take 1 mg by mouth 4 (four) times daily.     amLODipine (NORVASC) 10 MG tablet Take 1 tablet (10 mg total) by mouth daily. 30 tablet 0   atorvastatin (LIPITOR) 10 MG tablet Take 1 tablet (10 mg total) by  mouth at bedtime. 90 tablet 1   Cholecalciferol (VITAMIN D3) 50 MCG (2000 UT) capsule Take by mouth daily.      Continuous Blood Gluc Sensor (DEXCOM G6 SENSOR) MISC 1 each by Does not apply route See admin instructions. Change sensor every 10 days; E11.9 3 each 2   Continuous Blood Gluc Transmit (DEXCOM G6 TRANSMITTER) MISC 1 EACH BY DOES NOT APPLY ROUTE SEE ADMIN INSTRUCTIONS. FOR CONTINUOUS GLUCOSE MONITORING E11.9 1 each 0   fluticasone (FLOVENT HFA) 44 MCG/ACT inhaler Inhale 1 puff into the lungs 2 (two) times daily. 1 each 5   gabapentin (NEURONTIN) 300 MG capsule Take 1 capsule (300 mg total) by mouth 3 (three) times daily. As needed 270 capsule 1   glucagon 1 MG injection Inject 1 mg into the muscle once as needed. 1 each 12     Ibuprofen (ADVIL PO) Take 2-3 tablets by mouth every 6 (six) hours as needed.     insulin aspart (NOVOLOG) 100 UNIT/ML injection USE IN INSULIN PUMP UP TO 40 UNITS DAILY 10 mL 0   levalbuterol (XOPENEX HFA) 45 MCG/ACT inhaler Inhale 1-2 puffs into the lungs 3 (three) times daily. 1 Inhaler 3   meclizine (ANTIVERT) 25 MG tablet Take 1 tablet (25 mg total) by mouth 3 (three) times daily as needed for dizziness. 30 tablet 0   Multiple Vitamin (MULTIVITAMIN WITH MINERALS) TABS tablet Take 1 tablet by mouth daily.     omeprazole (PRILOSEC) 40 MG capsule Take 1 capsule (40 mg total) by mouth in the morning and at bedtime. 180 capsule 1   oxyCODONE-acetaminophen (PERCOCET) 10-325 MG tablet SMARTSIG:1 Pill By Mouth 1 to 4 Times Daily PRN     PARoxetine (PAXIL) 20 MG tablet Take 10 mg by mouth daily. Take 1/2 tablet (10mg  total) by mouth once daily.     sulfamethoxazole-trimethoprim (BACTRIM DS) 800-160 MG tablet Take 1 tablet by mouth 2 (two) times daily.     valACYclovir (VALTREX) 500 MG tablet Take 1 tablet (500 mg total) by mouth 2 (two) times daily. (Patient taking differently: Take 500 mg by mouth as needed. ) 10 tablet 3   lisinopril (ZESTRIL) 10 MG tablet Take 1 tablet (10 mg total) by mouth daily. 30 tablet 3   rOPINIRole (REQUIP) 1 MG tablet TAKE 1 TABLET BY MOUTH AT BEDTIME (PLZ SCHED APPT WITH NEW PROVIDER)  Please make appointment with Dr. Rex Kras.                 Needs to make appointment with Dr. Rex Kras. (Patient not taking: Reported on 03/03/2020) 90 tablet 0   zinc gluconate 50 MG tablet Take 50 mg by mouth daily. (Patient not taking: Reported on 03/03/2020)     No current facility-administered medications for this visit.    No Known Allergies  Past Medical History:  Diagnosis Date   Anxiety state, unspecified 03/26/2009   ASTHMA 02/02/2007   Asthma    DEPRESSION 02/16/2009   DIABETES MELLITUS, TYPE I 02/02/2007   Endometriosis    GERD 12/21/2008    Herpes    at 18   Hyperlipidemia    Lesion of vocal cord    Osteoarthritis    Shortness of breath 09/25/2007   Vocal fold leukoplakia     Blood pressure (!) 142/64, pulse 70, resp. rate 16, height 5\' 4"  (1.626 m), weight 121 lb (54.9 kg), SpO2 98 %.  Hypertension Patient with systolic/diastolic hypertension, currently not at goal.  We had a long discussion about how her  anxiety as well as other medications play into this diagnosis, but that in the end, it needs to be treated aggressively while she is young.  Because of her DM and diastolic hypertension, will start her on lisinopril 10 mg daily.  She will need to have a metabolic panel drawn in 2 weeks when she comes in to see Dr. Margaretann Loveless.  She was also concerned about the medications making her pressure go too low, assured her that this would not happen with the doses she is currently taking.  She was given instructions on proper BP technique and is to check no more than twice daily and bring a log of those readings to her appointment with Dr. Margaretann Loveless.  She is concerned about medical costs, so we will try to work with her on a mixture of phone calls ans office visits to help her achieve her BP goals.     Tommy Medal PharmD CPP [facility] [address] [place], [place] [address] [phone]

## 2020-03-03 NOTE — Assessment & Plan Note (Signed)
Patient with systolic/diastolic hypertension, currently not at goal.  We had a long discussion about how her anxiety as well as other medications play into this diagnosis, but that in the end, it needs to be treated aggressively while she is young.  Because of her DM and diastolic hypertension, will start her on lisinopril 10 mg daily.  She will need to have a metabolic panel drawn in 2 weeks when she comes in to see Dr. Margaretann Loveless.  She was also concerned about the medications making her pressure go too low, assured her that this would not happen with the doses she is currently taking.  She was given instructions on proper BP technique and is to check no more than twice daily and bring a log of those readings to her appointment with Dr. Margaretann Loveless.  She is concerned about medical costs, so we will try to work with her on a mixture of phone calls ans office visits to help her achieve her BP goals.

## 2020-03-03 NOTE — Patient Instructions (Signed)
Return for a a follow up appointment with Dr. Margaretann Loveless at the end of the month  Go to the lab when you see Dr. Margaretann Loveless   Check your blood pressure at home daily (if able) and keep record of the readings.  Take your BP meds as follows:   Continue amlodipine 10 mg each morning   Start lisinopril 10 mg each evening  Continue with all other medications  Bring all of your meds, your BP cuff and your record of home blood pressures to your next appointment.  Exercise as you're able, try to walk approximately 30 minutes per day.  Keep salt intake to a minimum, especially watch canned and prepared boxed foods.  Eat more fresh fruits and vegetables and fewer canned items.  Avoid eating in fast food restaurants.    HOW TO TAKE YOUR BLOOD PRESSURE: . Rest 5 minutes before taking your blood pressure. .  Don't smoke or drink caffeinated beverages for at least 30 minutes before. . Take your blood pressure before (not after) you eat. . Sit comfortably with your back supported and both feet on the floor (don't cross your legs). . Elevate your arm to heart level on a table or a desk. . Use the proper sized cuff. It should fit smoothly and snugly around your bare upper arm. There should be enough room to slip a fingertip under the cuff. The bottom edge of the cuff should be 1 inch above the crease of the elbow. . Ideally, take 3 measurements at one sitting and record the average.

## 2020-03-05 ENCOUNTER — Telehealth: Payer: Self-pay | Admitting: Endocrinology

## 2020-03-05 NOTE — Telephone Encounter (Signed)
Please review pt request and advise if you have received any infusion sets from our rep. Otherwise, I will need to call the pt and advise her to call [employer] about this request

## 2020-03-05 NOTE — Telephone Encounter (Signed)
Please let pt know that we do not have any samples in this office.

## 2020-03-05 NOTE — Telephone Encounter (Signed)
Patient called stating she needs infusion sets and Leonia Reader advised her to call us to see if we have any she can have? Please advise. Ph# [phone]  (Auto soft XC by [employer])

## 2020-03-05 NOTE — Telephone Encounter (Signed)
Returned pt call and advised about Team Lead's message below. Pt proceeded to explain her issues with regards to insurance and shipment delays. Asked if she has followed up with her DME supplier to determine the status of her shipment. Denied having done so but states she will call now. Also asked if she is able to reach out to the office where Vaughan Basta is to determine if another staff member could retrieve the infusion sets she had for her and she stated they could not get the supplies for her in Linda's absence. Finally, asked pt if she would like me to send a message to Dr. [name] to obtain TEMPORARY injection orders until she can resolve the shipment issue. Pt declined. States, "I will just duct tape the infusion set and hope it works." Advised this would not be necessary as we are able to provide a solution to her concern. Pt adamantly declined stating, "I've gotten use to the pump and don't want to go back to injections." Routing this message to Dr. [name] so he is aware. Pt next follow up appt is Monday.

## 2020-03-08 ENCOUNTER — Ambulatory Visit: Payer: 59 | Admitting: Neurology

## 2020-03-08 ENCOUNTER — Other Ambulatory Visit: Payer: Self-pay

## 2020-03-08 ENCOUNTER — Encounter: Payer: Self-pay | Admitting: Endocrinology

## 2020-03-08 ENCOUNTER — Ambulatory Visit (INDEPENDENT_AMBULATORY_CARE_PROVIDER_SITE_OTHER): Payer: Medicaid Other | Admitting: Endocrinology

## 2020-03-08 VITALS — BP 128/72 | HR 113 | Ht 64.0 in | Wt 121.2 lb

## 2020-03-08 DIAGNOSIS — E1049 Type 1 diabetes mellitus with other diabetic neurological complication: Secondary | ICD-10-CM

## 2020-03-08 DIAGNOSIS — IMO0002 Reserved for concepts with insufficient information to code with codable children: Secondary | ICD-10-CM

## 2020-03-08 DIAGNOSIS — E1065 Type 1 diabetes mellitus with hyperglycemia: Secondary | ICD-10-CM

## 2020-03-08 LAB — POCT GLYCOSYLATED HEMOGLOBIN (HGB A1C): Hemoglobin A1C: 8.1 % — AB (ref 4.0–5.6)

## 2020-03-08 MED ORDER — ACCU-CHEK GUIDE VI STRP: 1.0000 | ORAL_STRIP | Freq: Two times a day (BID) | 3 refills | Status: DC

## 2020-03-08 MED ORDER — ACCU-CHEK AVIVA PLUS VI STRP: 1.0000 | ORAL_STRIP | Freq: Two times a day (BID) | 3 refills | Status: DC

## 2020-03-08 MED ORDER — ACCU-CHEK SOFTCLIX LANCETS MISC: 1.0000 | Freq: Two times a day (BID) | 2 refills | Status: DC

## 2020-03-08 NOTE — Patient Instructions (Addendum)
Please finish the antibiotic.  Please see your primary care provider if the improve,ment does not continue.  Please continue the same pump settings. Please see Vaughan Basta, to review carb counting.   check your blood sugar 4 times a day: before the 3 meals, and at bedtime.  also check if you have symptoms of your blood sugar being too high or too low.  please keep a record of the readings and bring it to your next appointment here (or you can bring the meter itself).  You can write it on any piece of paper.  please call us sooner if your blood sugar goes below 70, or if you have a lot of readings over 200.   Please come back for a follow-up appointment in 2 months.

## 2020-03-08 NOTE — Progress Notes (Signed)
Subjective:    Patient ID: [name], female    DOB: 02-10-1976, 44 y.o.   MRN: [id number]  HPI Pt returns for f/u of diabetes mellitus: DM type: 1 Dx'ed: 3491 Complications: polyneuropathy and retinopathy.  Therapy: insulin since dx, pump rx since 2017.  DKA: never Severe hypoglycemia: never Pancreatitis: never SDOH: therapy has been limited by chronic noncompliance.   Other:  she has had TAH;  She declines continuous glucose monitor.   Interval history: she takes these pump settings: basal rate of 2 units/hr. bolus of 1 unit/1 grams carbohydrate (but pt says she instead makes a general estimate of a meal size to determine bolus).   correction bolus (which some people call "sensitivity," or "insulin sensitivity ratio," or just "isr") of 1 unit for each 600 by which your glucose exceeds 130. TDD is 16 units (39% basal, 53% meal bolus).  Control IQ in on 95% of the day.   Pt says she continues to have difficulty operating her pump.  I reviewed continuous glucose monitor data.  Glucose varies from 70-370.  It is in general highest at 2 PM.  Otherwise, There is no trend throughout the day.  Past Medical History:  Diagnosis Date  . Anxiety state, unspecified 03/26/2009  . ASTHMA 02/02/2007  . Asthma   . DEPRESSION 02/16/2009  . DIABETES MELLITUS, TYPE I 02/02/2007  . Endometriosis   . GERD 12/21/2008  . Herpes    at 18  . Hyperlipidemia   . Lesion of vocal cord   . Osteoarthritis   . Shortness of breath 09/25/2007  . Vocal fold leukoplakia     Past Surgical History:  Procedure Laterality Date  . ABDOMINAL HYSTERECTOMY  2009   BSO  . DIRECT LARYNGOSCOPY N/A 08/17/2017   Procedure: DIRECT LARYNGOSCOPY WITH OPERATING TELESCOPE WITH BIOPSY;  Surgeon: Helayne Seminole, MD;  Location: [facility];  Service: ENT;  Laterality: N/A;  . NASOPHARYNGEAL BIOPSY Right 08/17/2017   Procedure: NASOPHARYNGEAL BIOPSY RIGHT NASAL LESION;  Surgeon: Helayne Seminole, MD;  Location: [facility];   Service: ENT;  Laterality: Right;    Social History   Socioeconomic History  . Marital status: Legally Separated    Spouse name: Not on file  . Number of children: 2  . Years of education: Not on file  . Highest education level: Not on file  Occupational History    Employer: UNEMPLOYED  Tobacco Use  . Smoking status: Current Every Day Smoker    Packs/day: 1.50    Years: 25.00    Pack years: 37.50    Types: Cigarettes  . Smokeless tobacco: Never Used  Vaping Use  . Vaping Use: Former  . Quit date: 02/23/2017  . Devices: only used tobacco   Substance and Sexual Activity  . Alcohol use: Not Currently    Alcohol/week: 0.0 standard drinks  . Drug use: No  . Sexual activity: Yes    Partners: Male    Birth control/protection: None    Comment: 1st intercourse-14, partners- 4, current partner- 4 months  Other Topics Concern  . Not on file  Social History Narrative   2 children--pt has custody. Husband pays child support.   Daily Caffeine Use-3 2 liters a day   Pt does not get regular exercise.   Social Determinants of Health   Financial Resource Strain:   . Difficulty of Paying Living Expenses: Not on file  Food Insecurity:   . Worried About Charity fundraiser in the Last Year:  Not on file  . Ran Out of Food in the Last Year: Not on file  Transportation Needs:   . Lack of Transportation (Medical): Not on file  . Lack of Transportation (Non-Medical): Not on file  Physical Activity:   . Days of Exercise per Week: Not on file  . Minutes of Exercise per Session: Not on file  Stress:   . Feeling of Stress : Not on file  Social Connections:   . Frequency of Communication with Friends and Family: Not on file  . Frequency of Social Gatherings with Friends and Family: Not on file  . Attends Religious Services: Not on file  . Active Member of Clubs or Organizations: Not on file  . Attends Archivist Meetings: Not on file  . Marital Status: Not on file  Intimate  Partner Violence:   . Fear of Current or Ex-Partner: Not on file  . Emotionally Abused: Not on file  . Physically Abused: Not on file  . Sexually Abused: Not on file    Current Outpatient Medications on File Prior to Visit  Medication Sig Dispense Refill  . ALPRAZolam (XANAX) 1 MG tablet Take 1 mg by mouth 4 (four) times daily.    Marland Kitchen amLODipine (NORVASC) 10 MG tablet Take 1 tablet (10 mg total) by mouth daily. 30 tablet 0  . atorvastatin (LIPITOR) 10 MG tablet Take 1 tablet (10 mg total) by mouth at bedtime. 90 tablet 1  . Cholecalciferol (VITAMIN D3) 50 MCG (2000 UT) capsule Take by mouth daily.     . Continuous Blood Gluc Sensor (DEXCOM G6 SENSOR) MISC 1 each by Does not apply route See admin instructions. Change sensor every 10 days; E11.9 3 each 2  . Continuous Blood Gluc Transmit (DEXCOM G6 TRANSMITTER) MISC 1 EACH BY DOES NOT APPLY ROUTE SEE ADMIN INSTRUCTIONS. FOR CONTINUOUS GLUCOSE MONITORING E11.9 1 each 0  . fluticasone (FLOVENT HFA) 44 MCG/ACT inhaler Inhale 1 puff into the lungs 2 (two) times daily. 1 each 5  . gabapentin (NEURONTIN) 300 MG capsule Take 1 capsule (300 mg total) by mouth 3 (three) times daily. As needed 270 capsule 1  . glucagon 1 MG injection Inject 1 mg into the muscle once as needed. 1 each 12  . Ibuprofen (ADVIL PO) Take 2-3 tablets by mouth every 6 (six) hours as needed.    . insulin aspart (NOVOLOG) 100 UNIT/ML injection USE IN INSULIN PUMP UP TO 40 UNITS DAILY 10 mL 0  . levalbuterol (XOPENEX HFA) 45 MCG/ACT inhaler Inhale 1-2 puffs into the lungs 3 (three) times daily. 1 Inhaler 3  . lisinopril (ZESTRIL) 10 MG tablet Take 1 tablet (10 mg total) by mouth daily. 30 tablet 3  . meclizine (ANTIVERT) 25 MG tablet Take 1 tablet (25 mg total) by mouth 3 (three) times daily as needed for dizziness. 30 tablet 0  . Multiple Vitamin (MULTIVITAMIN WITH MINERALS) TABS tablet Take 1 tablet by mouth daily.    Marland Kitchen omeprazole (PRILOSEC) 40 MG capsule Take 1 capsule (40 mg  total) by mouth in the morning and at bedtime. 180 capsule 1  . oxyCODONE-acetaminophen (PERCOCET) 10-325 MG tablet SMARTSIG:1 Pill By Mouth 1 to 4 Times Daily PRN    . PARoxetine (PAXIL) 20 MG tablet Take 10 mg by mouth daily. Take 1/2 tablet (10mg  total) by mouth once daily.    Marland Kitchen rOPINIRole (REQUIP) 1 MG tablet TAKE 1 TABLET BY MOUTH AT BEDTIME (PLZ SCHED APPT WITH NEW PROVIDER)  Please make appointment with  Dr. Rex Kras.                 Needs to make appointment with Dr. Rex Kras. 90 tablet 0  . sulfamethoxazole-trimethoprim (BACTRIM DS) 800-160 MG tablet Take 1 tablet by mouth 2 (two) times daily.    . valACYclovir (VALTREX) 500 MG tablet Take 1 tablet (500 mg total) by mouth 2 (two) times daily. (Patient taking differently: Take 500 mg by mouth as needed. ) 10 tablet 3  . zinc gluconate 50 MG tablet Take 50 mg by mouth daily.      No current facility-administered medications on file prior to visit.    No Known Allergies  Family History  Problem Relation Age of Onset  . Diabetes Mother   . Diabetes Maternal Aunt   . Emphysema Maternal Uncle   . Diabetes Maternal Grandfather   . Lung cancer Maternal Aunt   . Colon cancer Neg Hx   . Kidney disease Neg Hx   . Liver disease Neg Hx   . Esophageal cancer Neg Hx   . Rectal cancer Neg Hx   . Stomach cancer Neg Hx     BP 128/72   Pulse (!) 113   Ht 5\' 4"  (1.626 m)   Wt 121 lb 3.2 oz (55 kg)   SpO2 96%   BMI 20.80 kg/m    Review of Systems Abscess at the left should is better after I and D 3 days ago (she was rx'ed septra).      Objective:   Physical Exam VITAL SIGNS:  See vs page GENERAL: no distress Pulses: dorsalis pedis intact bilat.   MSK: no deformity of the feet CV: no leg edema.  Skin:  no ulcer on the feet.  normal color and temp on the feet.  Left scapular area: 4 cm area of erythema with central incision.   Neuro: sensation is intact to touch on the feet.     Lab Results  Component Value Date    HGBA1C 8.1 (A) 03/08/2020       Assessment & Plan:  Type 1 DM: uncontrolled.  Due to pt's functional limitations, I am uncertain that pump is a good option.   Hypoglycemia, due to insulin: this limits aggressiveness of glycemic control Abscess: much better, but she needs to continue to watch carefully.   Patient Instructions  Please finish the antibiotic.  Please see your primary care provider if the improve,ment does not continue.  Please continue the same pump settings. Please see Vaughan Basta, to review carb counting.   check your blood sugar 4 times a day: before the 3 meals, and at bedtime.  also check if you have symptoms of your blood sugar being too high or too low.  please keep a record of the readings and bring it to your next appointment here (or you can bring the meter itself).  You can write it on any piece of paper.  please call us sooner if your blood sugar goes below 70, or if you have a lot of readings over 200.   Please come back for a follow-up appointment in 2 months.

## 2020-03-09 ENCOUNTER — Telehealth: Payer: Self-pay | Admitting: Endocrinology

## 2020-03-09 NOTE — Telephone Encounter (Signed)
Called pt to make her aware of instructions below. States she is currently using a basal rate of 0.2 units/hr. Pt has the following questions:  Can she turn off the correction bolus to see if this resolves the low CBG's? Just started Lisinopril. Her provider informed her that Lisinopril may affect her kidneys. Could this be a problem and should she stop the medication? Do you want to leave the basal rate as mentioned above?

## 2020-03-09 NOTE — Telephone Encounter (Signed)
please contact patient: I heard about your call last night.  Please decrease basal rate to 1.8 units/hr. bolus of 1 unit/1 gram carbohydrate  correction bolus (which some people call "sensitivity," or "insulin sensitivity ratio," or just "isr") of 1 unit for each 600 by which your glucose exceeds 130.

## 2020-03-09 NOTE — Telephone Encounter (Signed)
Ok, please reduce the basal rate to 0.1 units/hr

## 2020-03-10 ENCOUNTER — Other Ambulatory Visit: Payer: Self-pay

## 2020-03-10 ENCOUNTER — Telehealth: Payer: Self-pay | Admitting: Nutrition

## 2020-03-10 DIAGNOSIS — IMO0002 Reserved for concepts with insufficient information to code with codable children: Secondary | ICD-10-CM

## 2020-03-10 MED ORDER — INSULIN ASPART 100 UNIT/ML ~~LOC~~ SOLN: SUBCUTANEOUS | 0 refills | Status: DC

## 2020-03-10 NOTE — Telephone Encounter (Signed)
Called pt to inform about Dr. Cordelia Pen response to her questions below:   Can she turn off the correction bolus to see if this resolves the low CBG's? Not that he is aware of  Just started Lisinopril. Her provider informed her that Lisinopril may affect her kidneys. Could this be a problem and should she stop the medication? Lisinopril does not affect kidneys but rather "protects kidneys"  Do you want to leave the basal rate as mentioned above? No. Please reduce to 0.1 units/hr  LVM requesting returned call

## 2020-03-10 NOTE — Telephone Encounter (Signed)
Returned pt call and informed her about Dr. Cordelia Pen responses below. Pt states she does not agree with adjusting the bolus. Strongly feels the issues are with the CORRECTIONS. Would rather change the correction from 600 to 700. Wants to know if she can get these orders from [facility] instead. Advised this message is being forwarded to Dr. [name]. Orders that pertain to her pump will need to come from Dr. [name]. States, well he needs to know that the pump is only being used for the basal then disconnected the rest of the day. Routing to Dr. [name] to make him aware.

## 2020-03-10 NOTE — Telephone Encounter (Signed)
Patient returned call - she asked if you could call her back.

## 2020-03-10 NOTE — Telephone Encounter (Signed)
Ok Lisinopril protects the kidneys.

## 2020-03-10 NOTE — Telephone Encounter (Signed)
Outpatient Medication Detail   Disp Refills Start End   insulin aspart (NOVOLOG) 100 UNIT/ML injection 10 mL 0 03/10/2020    Sig: USE IN INSULIN PUMP UP TO 40 UNITS DAILY (basal rate setting for insulin pump 0.1 units/hr; correction bolus 1 units for each 700 by which glucose exceeds 130)   Class: No Print

## 2020-03-10 NOTE — Telephone Encounter (Signed)
Ok, change the correction from 600 to 700

## 2020-03-10 NOTE — Telephone Encounter (Signed)
Pt will ask the following questions. Please advise:  Can she turn off the correction bolus to see if this resolves the low CBG's? Just started Lisinopril. Her provider informed her that Lisinopril may affect her kidneys. Could this be a problem and should she stop the medication?

## 2020-03-10 NOTE — Telephone Encounter (Signed)
Message left on machine to call her to help her with making changes to correction dose.   I phoned her and she has reduced the basal rate to 0.1, and has turned the exercise mode on so the target is set to 150.  The pump will not allow to do a correction up to 700.  She was told to call customer care, or I gave her the [employer] educator's number to call for help with this.   She admits to taking the pump off at night, so she does not drop low, "and her FBS will only be 180 in the AM".  She also admits to not giving boluses for meals of hot dog, fries and diet drink, with 2hr. pc readings in the 170s.  She is not counting carbs, and was encouraged to learn to do this for a more accurate amount for the boluses--which may be dropping her blood sugars.   We discussed that the basal rate is now 0.1, which is half as much as 0.2, and maybe this will prevent her from having lows, and I encouraged her to wear this pump all night to give [place] a better feel for what her basal needs are.  She agreed to do this.

## 2020-03-10 NOTE — Telephone Encounter (Signed)
Called pt and informed her about Dr. Cordelia Pen response below. Med list updated to reflect

## 2020-03-15 ENCOUNTER — Other Ambulatory Visit: Payer: Self-pay

## 2020-03-15 ENCOUNTER — Encounter: Payer: Self-pay | Admitting: Internal Medicine

## 2020-03-15 ENCOUNTER — Ambulatory Visit (INDEPENDENT_AMBULATORY_CARE_PROVIDER_SITE_OTHER): Payer: Medicaid Other | Admitting: Internal Medicine

## 2020-03-15 VITALS — BP 120/72 | HR 100 | Temp 98.1°F | Ht 64.0 in | Wt 121.0 lb

## 2020-03-15 DIAGNOSIS — Z720 Tobacco use: Secondary | ICD-10-CM

## 2020-03-15 DIAGNOSIS — E782 Mixed hyperlipidemia: Secondary | ICD-10-CM

## 2020-03-15 DIAGNOSIS — E1065 Type 1 diabetes mellitus with hyperglycemia: Secondary | ICD-10-CM | POA: Diagnosis not present

## 2020-03-15 DIAGNOSIS — IMO0002 Reserved for concepts with insufficient information to code with codable children: Secondary | ICD-10-CM

## 2020-03-15 DIAGNOSIS — I10 Essential (primary) hypertension: Secondary | ICD-10-CM | POA: Diagnosis not present

## 2020-03-15 NOTE — Progress Notes (Signed)
Cardiology Office Note:    Date:  03/15/2020   ID:  JAILEY BOOTON, DOB 01-07-1976, MRN [id number]  PCP:  Tamsen Roers, MD  Cardiologist:  Elouise Munroe, MD  Electrophysiologist:  None   Referring MD: Tamsen Roers, MD   Chief Complaint: elevated BP, tachycardia  History of Present Illness:    [name] is a 44 y.o. female well known to my clinic. history of asthma, depression, type 1 diabetes, endometriosis, GERD, hyperlipidemia, dyspnea.  I initially met the patient in emergency department follow-up in our [facility] office in November 2019.  At that time she describes mid chest heaviness that radiates to her teeth, jaws, left shoulder while exerting herself and improving with rest.  She also had exertional shortness of breath.  She noted a sense of difficulty breathing as soon as she wakes up in the morning.  She has type 1 diabetes and tells me that her mother has coronary artery disease in the setting of type 1 diabetes as well.  We performed a Myoview at that time which was low risk.   We have attempted to perform a coronary CTA given continued atypical chest pain, however her HR was too high to get a quality scan. Calcium scoring only was performed and calcium score is 0. She was very worried about not being able to complete the scan. We discussed with no coronary calcifications, atypical chest pain and normal myoview, severe CAD is less likely, however if we can get better control of her HR with medical therapy and lifestyle factors, we can certainly try again.    Her main concern is elevated blood pressure and major life stressors. She finds it difficult to relieve her stress and this contributes to her blood pressure. We've discussed how this also may be impacting chest pain symptoms.   Since our last visit we were able to start and titrate amlodipine. BP is at goal today.   HR still elevated. We discussed that anxiety and smoking both and be drivers for  tachycardia. She has significant anxiety and stress. This has improved slightly since our last visit but remains a driver for symptoms. Will have concomitant chest discomfort and SOB. Normal nuc stress.    Past Medical History:  Diagnosis Date  . Anxiety state, unspecified 03/26/2009  . ASTHMA 02/02/2007  . Asthma   . DEPRESSION 02/16/2009  . DIABETES MELLITUS, TYPE I 02/02/2007  . Endometriosis   . GERD 12/21/2008  . Herpes    at 18  . Hyperlipidemia   . Lesion of vocal cord   . Osteoarthritis   . Shortness of breath 09/25/2007  . Vocal fold leukoplakia     Past Surgical History:  Procedure Laterality Date  . ABDOMINAL HYSTERECTOMY  2009   BSO  . DIRECT LARYNGOSCOPY N/A 08/17/2017   Procedure: DIRECT LARYNGOSCOPY WITH OPERATING TELESCOPE WITH BIOPSY;  Surgeon: Helayne Seminole, MD;  Location: [place];  Service: ENT;  Laterality: N/A;  . NASOPHARYNGEAL BIOPSY Right 08/17/2017   Procedure: NASOPHARYNGEAL BIOPSY RIGHT NASAL LESION;  Surgeon: Helayne Seminole, MD;  Location: [place];  Service: ENT;  Laterality: Right;    Current Medications: Current Meds  Medication Sig  . Accu-Chek Softclix Lancets lancets 1 each by Other route 2 (two) times daily. E11.9  . ALPRAZolam (XANAX) 1 MG tablet Take 1 mg by mouth 4 (four) times daily.  Marland Kitchen amLODipine (NORVASC) 10 MG tablet Take 1 tablet (10 mg total) by mouth daily.  Marland Kitchen atorvastatin (LIPITOR)  10 MG tablet Take 1 tablet (10 mg total) by mouth at bedtime.  . Cholecalciferol (VITAMIN D3) 50 MCG (2000 UT) capsule Take by mouth daily.   . Continuous Blood Gluc Sensor (DEXCOM G6 SENSOR) MISC 1 each by Does not apply route See admin instructions. Change sensor every 10 days; E11.9  . Continuous Blood Gluc Transmit (DEXCOM G6 TRANSMITTER) MISC 1 EACH BY DOES NOT APPLY ROUTE SEE ADMIN INSTRUCTIONS. FOR CONTINUOUS GLUCOSE MONITORING E11.9  . fluticasone (FLOVENT HFA) 44 MCG/ACT inhaler Inhale 1 puff into the lungs 2 (two) times daily.  Marland Kitchen gabapentin  (NEURONTIN) 300 MG capsule Take 1 capsule (300 mg total) by mouth 3 (three) times daily. As needed  . glucagon 1 MG injection Inject 1 mg into the muscle once as needed.  Marland Kitchen glucose blood (ACCU-CHEK GUIDE) test strip 1 each by Other route in the morning and at bedtime. E11.9  . Ibuprofen (ADVIL PO) Take 2-3 tablets by mouth every 6 (six) hours as needed.  . insulin aspart (NOVOLOG) 100 UNIT/ML injection USE IN INSULIN PUMP UP TO 40 UNITS DAILY (basal rate setting for insulin pump 0.1 units/hr; correction bolus 1 units for each 700 by which glucose exceeds 130)  . levalbuterol (XOPENEX HFA) 45 MCG/ACT inhaler Inhale 1-2 puffs into the lungs 3 (three) times daily.  Marland Kitchen lisinopril (ZESTRIL) 10 MG tablet Take 1 tablet (10 mg total) by mouth daily.  . meclizine (ANTIVERT) 25 MG tablet Take 1 tablet (25 mg total) by mouth 3 (three) times daily as needed for dizziness.  . Multiple Vitamin (MULTIVITAMIN WITH MINERALS) TABS tablet Take 1 tablet by mouth daily.  Marland Kitchen omeprazole (PRILOSEC) 40 MG capsule Take 1 capsule (40 mg total) by mouth in the morning and at bedtime.  Marland Kitchen oxyCODONE-acetaminophen (PERCOCET) 10-325 MG tablet SMARTSIG:1 Pill By Mouth 1 to 4 Times Daily PRN  . PARoxetine (PAXIL) 20 MG tablet Take 10 mg by mouth daily. Take 1/2 tablet (28m total) by mouth once daily.  .Marland KitchenrOPINIRole (REQUIP) 1 MG tablet TAKE 1 TABLET BY MOUTH AT BEDTIME (PLZ SCHED APPT WITH NEW PROVIDER)  Please make appointment with Dr. LRex Kras                 Needs to make appointment with Dr. LRex Kras  . sulfamethoxazole-trimethoprim (BACTRIM DS) 800-160 MG tablet Take 1 tablet by mouth 2 (two) times daily.  . valACYclovir (VALTREX) 500 MG tablet Take 1 tablet (500 mg total) by mouth 2 (two) times daily. (Patient taking differently: Take 500 mg by mouth as needed. )  . zinc gluconate 50 MG tablet Take 50 mg by mouth daily.      Allergies:   Patient has no known allergies.   Social History   Tobacco Use  .  Smoking status: Current Every Day Smoker    Packs/day: 1.50    Years: 25.00    Pack years: 37.50    Types: Cigarettes  . Smokeless tobacco: Never Used  Vaping Use  . Vaping Use: Former  . Quit date: 02/23/2017  . Devices: only used tobacco   Substance Use Topics  . Alcohol use: Not Currently    Alcohol/week: 0.0 standard drinks  . Drug use: No     Family History: The patient's family history includes Diabetes in her maternal aunt, maternal grandfather, and mother; Emphysema in her maternal uncle; Lung cancer in her maternal aunt. There is no history of Colon cancer, Kidney disease, Liver disease, Esophageal cancer, Rectal cancer, or Stomach cancer.  ROS:  Please see the history of present illness.    All other systems reviewed and are negative.  EKGs/Labs/Other Studies Reviewed:    The following studies were reviewed today:  EKG:  SR, short PR, no delta wave. Septal infarct pattern  Recent Labs: 09/11/2019: Creatinine, Ser 0.80  Recent Lipid Panel    Component Value Date/Time   CHOL 220 (H) 05/12/2010 1152   TRIG 200.0 (H) 05/12/2010 1152   HDL 49.90 05/12/2010 1152   CHOLHDL 4 05/12/2010 1152   VLDL 40.0 05/12/2010 1152   LDLDIRECT 142.7 05/12/2010 1152    Physical Exam:    VS:  BP 120/72   Pulse 100   Temp 98.1 F (36.7 C)   Ht '5\' 4"'  (1.626 m)   Wt 121 lb (54.9 kg)   SpO2 98%   BMI 20.77 kg/m     Wt Readings from Last 5 Encounters:  03/15/20 121 lb (54.9 kg)  03/08/20 121 lb 3.2 oz (55 kg)  03/03/20 121 lb (54.9 kg)  01/05/20 124 lb 12.8 oz (56.6 kg)  12/31/19 126 lb 9.6 oz (57.4 kg)     Constitutional: No acute distress Eyes: sclera non-icteric, normal conjunctiva and lids ENMT: normal dentition, moist mucous membranes Cardiovascular: regular rhythm, normal rate, no murmurs. S1 and S2 normal. Radial pulses normal bilaterally. No jugular venous distention.  Respiratory: clear to auscultation bilaterally GI : normal bowel sounds, soft and nontender.  No distention.   MSK: extremities warm, well perfused. No edema.  NEURO: grossly nonfocal exam, moves all extremities. PSYCH: alert and oriented x 3, anxious  ASSESSMENT:    1. Hypertension, unspecified type   2. Mixed hyperlipidemia   3. Diabetes mellitus type 1, uncontrolled, insulin dependent ([place])   4. Tobacco abuse    PLAN:    Hypertension, unspecified type - Plan: EKG 12-Lead  Mixed hyperlipidemia  Diabetes mellitus type 1, uncontrolled, insulin dependent ([place])  Tobacco abuse   Has seen pharmacist, agree with continuing amlodipine 10 mg daily. Continue lisinopril.   Strongly recommend counseling for anxiety/stress management as this is a confounding symptom in her cardiovascular risk factors.   DM1, continue statin, lisinopril. A1C 8.1.   Tachycardia - likely driven by above mentioned factors. No signs of infection or systemic process that should be a major contributor. When reversible causes are addressed, if HR remains elevated, would consider monitor and evaluation for inappropriate sinus tachycardia. Could consider ivabradine, particularly if she would like to reconsider CCTA.   Total time of encounter: 30 minutes total time of encounter, including 25 minutes spent in face-to-face patient care on the date of this encounter. This time includes coordination of care and counseling regarding above mentioned problem list. Remainder of non-face-to-face time involved reviewing chart documents/testing relevant to the patient encounter and documentation in the medical record. I have independently reviewed documentation from referring provider.   Cherlynn Kaiser, MD [place]  [facility]    Medication Adjustments/Labs and Tests Ordered: Current medicines are reviewed at length with the patient today.  Concerns regarding medicines are outlined above.  Orders Placed This Encounter  Procedures  . EKG 12-Lead   No orders of the defined types were placed in this  encounter.   Patient Instructions  Medication Instructions:  No Changes In Medications at this time.   *If you need a refill on your cardiac medications before your next appointment, please call your pharmacy*   Lab Work: None Ordered At This Time.   If you have labs (blood work) drawn  today and your tests are completely normal, you will receive your results only by: Marland Kitchen MyChart Message (if you have MyChart) OR . A paper copy in the mail If you have any lab test that is abnormal or we need to change your treatment, we will call you to review the results.   Testing/Procedures: None Ordered At This Time.     Follow-Up: At [facility], you and your health needs are our priority.  As part of our continuing mission to provide you with exceptional heart care, we have created designated Provider Care Teams.  These Care Teams include your primary Cardiologist (physician) and Advanced Practice Providers (APPs -  Physician Assistants and Nurse Practitioners) who all work together to provide you with the care you need, when you need it.  We recommend signing up for the patient portal called "MyChart".  Sign up information is provided on this After Visit Summary.  MyChart is used to connect with patients for Virtual Visits (Telemedicine).  Patients are able to view lab/test results, encounter notes, upcoming appointments, etc.  Non-urgent messages can be sent to your provider as well.   To learn more about what you can do with MyChart, go to [email or web address].    Your next appointment:   6 month(s)  The format for your next appointment:   Virtual   Provider:   You may see Elouise Munroe, MD or one of the following Advanced Practice Providers on your designated Care Team:    Rosaria Ferries, PA-C  Jory Sims, DNP, ANP  Cadence Kathlen Mody, PA-C

## 2020-03-15 NOTE — Patient Instructions (Signed)
Medication Instructions:  No Changes In Medications at this time.   *If you need a refill on your cardiac medications before your next appointment, please call your pharmacy*   Lab Work: None Ordered At This Time.   If you have labs (blood work) drawn today and your tests are completely normal, you will receive your results only by: Marland Kitchen MyChart Message (if you have MyChart) OR . A paper copy in the mail If you have any lab test that is abnormal or we need to change your treatment, we will call you to review the results.   Testing/Procedures: None Ordered At This Time.     Follow-Up: At [facility], you and your health needs are our priority.  As part of our continuing mission to provide you with exceptional heart care, we have created designated Provider Care Teams.  These Care Teams include your primary Cardiologist (physician) and Advanced Practice Providers (APPs -  Physician Assistants and Nurse Practitioners) who all work together to provide you with the care you need, when you need it.  We recommend signing up for the patient portal called "MyChart".  Sign up information is provided on this After Visit Summary.  MyChart is used to connect with patients for Virtual Visits (Telemedicine).  Patients are able to view lab/test results, encounter notes, upcoming appointments, etc.  Non-urgent messages can be sent to your provider as well.   To learn more about what you can do with MyChart, go to [email or web address].    Your next appointment:   6 month(s)  The format for your next appointment:   Virtual   Provider:   You may see Elouise Munroe, MD or one of the following Advanced Practice Providers on your designated Care Team:    Rosaria Ferries, PA-C  Jory Sims, DNP, ANP  Cadence Kathlen Mody, PA-C

## 2020-03-16 ENCOUNTER — Ambulatory Visit: Payer: Medicaid Other | Admitting: Endocrinology

## 2020-03-19 ENCOUNTER — Other Ambulatory Visit: Payer: Self-pay | Admitting: Internal Medicine

## 2020-03-20 ENCOUNTER — Other Ambulatory Visit: Payer: Self-pay | Admitting: Endocrinology

## 2020-03-23 ENCOUNTER — Other Ambulatory Visit: Payer: Self-pay | Admitting: *Deleted

## 2020-03-23 DIAGNOSIS — Z1231 Encounter for screening mammogram for malignant neoplasm of breast: Secondary | ICD-10-CM

## 2020-03-26 ENCOUNTER — Other Ambulatory Visit: Payer: Self-pay | Admitting: Family Medicine

## 2020-03-26 DIAGNOSIS — N6310 Unspecified lump in the right breast, unspecified quadrant: Secondary | ICD-10-CM

## 2020-03-31 ENCOUNTER — Other Ambulatory Visit: Payer: Self-pay | Admitting: Internal Medicine

## 2020-03-31 NOTE — Telephone Encounter (Signed)
*  STAT* If patient is at the pharmacy, call can be transferred to refill team.   1. Which medications need to be refilled? (please list name of each medication and dose if known) amLODipine (NORVASC) 10 MG tablet  2. Which pharmacy/location (including street and city if local pharmacy) is medication to be sent to? [id number] - [name], [place]  3. Do they need a 30 day or 90 day supply? 90 day supply

## 2020-04-02 MED ORDER — AMLODIPINE BESYLATE 10 MG PO TABS: 10.0000 mg | ORAL_TABLET | Freq: Every day | ORAL | 0 refills | Status: DC

## 2020-04-02 NOTE — Telephone Encounter (Signed)
Medication sent to pharmacy  

## 2020-04-08 ENCOUNTER — Ambulatory Visit
Admission: RE | Admit: 2020-04-08 | Discharge: 2020-04-08 | Disposition: A | Discharge status: Home / Self Care | Payer: Medicaid Other | Source: Ambulatory Visit | Attending: Family Medicine | Admitting: Family Medicine

## 2020-04-08 ENCOUNTER — Other Ambulatory Visit: Payer: Self-pay

## 2020-04-08 ENCOUNTER — Telehealth: Payer: Self-pay | Admitting: Endocrinology

## 2020-04-08 DIAGNOSIS — N6310 Unspecified lump in the right breast, unspecified quadrant: Secondary | ICD-10-CM

## 2020-04-08 NOTE — Telephone Encounter (Signed)
In my opinion, it is safe.  I still recommend trying to get through, as you have been doing

## 2020-04-08 NOTE — Telephone Encounter (Signed)
Patient had a mammogram and she had her pump and her dexcom transmitter in the room with her during this, [name] is on vacation and she called tandem and cannot get through - she just wants to know if its safe to use the same transmitter and if her pump is okay? She was told not to have her pump on while getting an x-ray so she just wants to make sure and see if its okay. Ph# [phone]

## 2020-04-09 NOTE — Telephone Encounter (Signed)
Attempted to reach patient but mailbox is full.

## 2020-04-12 ENCOUNTER — Telehealth: Payer: Self-pay | Admitting: Internal Medicine

## 2020-04-12 NOTE — Telephone Encounter (Signed)
    Pt c/o BP issue: STAT if pt c/o blurred vision, one-sided weakness or slurred speech  1. What are your last 5 BP readings?  Upper number 100-110 Bottom number 58-70  2. Are you having any other symptoms (ex. Dizziness, headache, blurred vision, passed out)? Headache, dizziness, blurred vision  3. What is your BP issue? Pt said her bottom number is getting really low, she said in the morning she gets very dizzy specially when she stand up. She also have blurred vision mostly in the morning and headache. She said this started happening 1 week ago

## 2020-04-12 NOTE — Telephone Encounter (Signed)
LMTCB

## 2020-04-12 NOTE — Telephone Encounter (Signed)
Spoke with the patient at length regarding her BP concerns. She does not have an actual log of her BP readings but states her BP has been low for the past couple of weeks ranging from 100-110 over 50-60s. She takes her BP in the morning shortly after she wakes up & before she takes any of her medications. She states she has had blurred vision, headache and dizziness the past week or so. She has trouble getting out of bed in the morning due to feeling unsteady. Patient typically takes her Amlodipine 10mg  around noon (she wakes up late and stays up later into the night) and her Lisinopril around midnight each day. Will send to MD for advisement.

## 2020-04-12 NOTE — Telephone Encounter (Signed)
Patient is returning phone call about results. 

## 2020-04-13 NOTE — Telephone Encounter (Signed)
Spoke with patient regarding Dr. Hamilton Capri recommendation. Advised patient she can take Half a dose of Amlodipine (5mg ) and see how she feels with that, and to continue to monitor her blood pressure and write down those readings.  Also advised patient she should follow up with [facility] regarding her hypertension and medication management. Patient stated that she would like to do virtual appointment with [facility] but unfortunately that is not an option. Advised patient that she can follow up with [facility] in office, or a NP, or PA virtually. Virtual appointment made with Alejandra Memos, NP for 10/4 at 2:15 to discuss blood pressure medications and BP log. Patient verbalized understanding.

## 2020-04-13 NOTE — Telephone Encounter (Signed)
Left detailed message advising patient of safe use per Dr [name].

## 2020-04-13 NOTE — Telephone Encounter (Signed)
Let's arrange a visit with [facility] hypertension clinic. OK to take half dose of amlodipine, 5 mg, until follow up and see how she feels.

## 2020-04-19 ENCOUNTER — Telehealth: Payer: Medicaid Other | Admitting: General Practice

## 2020-04-19 NOTE — Progress Notes (Unsigned)
{Choose 1 Note Type (Telehealth Visit or Telephone Visit):[phone]}  Evaluation Performed:  Follow-up visit  This visit type was conducted due to national recommendations for restrictions regarding the COVID-19 Pandemic (e.g. social distancing).  This format is felt to be most appropriate for this patient at this time.  All issues noted in this document were discussed and addressed.  No physical exam was performed (except for noted visual exam findings with Video Visits).  Please refer to the patient's chart (MyChart message for video visits and phone note for telephone visits) for the patient's consent to telehealth for [facility]  Date:  04/19/2020   ID:  [name], DOB 06-Sep-1975, [name]  Patient Location: *** [address] [address]   Provider location:     [address] Office [phone] Fax [phone]   PCP:  Tamsen Roers, MD  Cardiologist:  Elouise Munroe, MD *** Electrophysiologist:  None   Chief Complaint: Follow-up for blood pressure  History of Present Illness:    [name] is a 44 y.o. female who presents via audio/video conferencing for a telehealth visit today.  Patient verified DOB and address.  She has a past medical history of asthma, depression, type 1 diabetes, endometriosis, GERD, HLD, and dyspnea.  She was initially seen by Dr. Marcelline Mates in the emergency department 11/19.  During that time she described mid chest heaviness that radiated to her teeth, jaws, and left shoulder.  Her discomfort was noted with activity and was relieved with rest.  She also noted shortness of breath with exertion.  Her mother had coronary artery disease in the setting of type 1 diabetes as well.  She underwent nuclear stress test at that time which showed low risk.  She also underwent coronary CTA due to continued atypical chest pain.  However, her heart rate was too high and  quality images were not obtained during scan.  Her calcium score was 0.  She was worried that she was not able to complete the scan.  The calcium scoring was discussed and medical management was recommended as well as lifestyle modifications.  She was last seen by Dr. Marcelline Mates on 03/15/2020.  During that time her blood pressure was at goal.  Her amlodipine was in the process of being titrated.  Her heart rate remained elevated at 100 bpm.  Her anxiety and her smoking were discussed as being contributing factors to her tachycardia.  She is seen virtually today in follow-up and states***  *** denies chest pain, shortness of breath, lower extremity edema, fatigue, palpitations, melena, hematuria, hemoptysis, diaphoresis, weakness, presyncope, syncope, orthopnea, and PND.   The patient {does/does not:200015} symptoms concerning for COVID-19 infection (fever, chills, cough, or new SHORTNESS OF BREATH).    Prior CV studies:   The following studies were reviewed today:  Coronary CTA 09/11/2019 EXAM: OVER-READ INTERPRETATION  CT CHEST  The following report is an over-read performed by radiologist Dr. Alvino Blood [facility] on 09/12/2019. This over-read does not include interpretation of cardiac or coronary anatomy or pathology. The coronary calcium score interpretation by the cardiologist is attached.  COMPARISON:  CT 04/30/2018  FINDINGS: Limited view of the lung parenchyma demonstrates no suspicious nodularity. Airways are normal.  Limited view of the mediastinum demonstrates no adenopathy. Esophagus normal.  Limited view of the upper abdomen unremarkable.  Limited view of the skeleton and chest wall is unremarkable.  IMPRESSION: No significant extracardiac findings.  FINDINGS: Non-cardiac: See separate report from [facility].  Ascending Aorta: normal  Pericardium: Normal  Coronary arteries: Normal origin, no calcifications  noted.  IMPRESSION: Coronary calcium score of 0.  Patient is low risk for near term coronary events  Nuclear stress test 05/27/2020  Nuclear stress EF: 70%.  Blood pressure demonstrated a normal response to exercise.  No T wave inversion was noted during stress.  There was no ST segment deviation noted during stress.  This is a low risk study.   Normal perfusion. LVEF 70% with normal wall motion. This is a low risk study.   Past Medical History:  Diagnosis Date  . Anxiety state, unspecified 03/26/2009  . ASTHMA 02/02/2007  . Asthma   . DEPRESSION 02/16/2009  . DIABETES MELLITUS, TYPE I 02/02/2007  . Endometriosis   . GERD 12/21/2008  . Herpes    at 18  . Hyperlipidemia   . Lesion of vocal cord   . Osteoarthritis   . Shortness of breath 09/25/2007  . Vocal fold leukoplakia    Past Surgical History:  Procedure Laterality Date  . ABDOMINAL HYSTERECTOMY  2009   BSO  . DIRECT LARYNGOSCOPY N/A 08/17/2017   Procedure: DIRECT LARYNGOSCOPY WITH OPERATING TELESCOPE WITH BIOPSY;  Surgeon: Helayne Seminole, MD;  Location: [facility];  Service: ENT;  Laterality: N/A;  . NASOPHARYNGEAL BIOPSY Right 08/17/2017   Procedure: NASOPHARYNGEAL BIOPSY RIGHT NASAL LESION;  Surgeon: Helayne Seminole, MD;  Location: [place];  Service: ENT;  Laterality: Right;     No outpatient medications have been marked as taking for the 04/19/20 encounter (Appointment) with Deberah Pelton, NP.     Allergies:   Patient has no known allergies.   Social History   Tobacco Use  . Smoking status: Current Every Day Smoker    Packs/day: 1.50    Years: 25.00    Pack years: 37.50    Types: Cigarettes  . Smokeless tobacco: Never Used  Vaping Use  . Vaping Use: Former  . Quit date: 02/23/2017  . Devices: only used tobacco   Substance Use Topics  . Alcohol use: Not Currently    Alcohol/week: 0.0 standard drinks  . Drug use: No     Family Hx: The patient's family history includes Diabetes in her maternal  aunt, maternal grandfather, and mother; Emphysema in her maternal uncle; Lung cancer in her maternal aunt. There is no history of Colon cancer, Kidney disease, Liver disease, Esophageal cancer, Rectal cancer, or Stomach cancer.  ROS:   Please see the history of present illness.     All other systems reviewed and are negative.   Labs/Other Tests and Data Reviewed:    Recent Labs: 09/11/2019: Creatinine, Ser 0.80   Recent Lipid Panel Lab Results  Component Value Date/Time   CHOL 220 (H) 05/12/2010 11:52 AM   TRIG 200.0 (H) 05/12/2010 11:52 AM   HDL 49.90 05/12/2010 11:52 AM   CHOLHDL 4 05/12/2010 11:52 AM   LDLDIRECT 142.7 05/12/2010 11:52 AM    Wt Readings from Last 3 Encounters:  03/15/20 121 lb (54.9 kg)  03/08/20 121 lb 3.2 oz (55 kg)  03/03/20 121 lb (54.9 kg)     Exam:    Vital Signs:  There were no vitals taken for this visit.   Well nourished, well developed female in no  acute distress.   ASSESSMENT & PLAN:    1.  Essential hypertension-BP today***.  Well-controlled at home. Continue amlodipine, lisinopril Heart healthy low-sodium diet-salty 6 given Increase  physical activity as tolerated  Tachycardia-heart rate today***.  Previously felt to be secondary to tobacco use and anxiety. Heart healthy low-sodium diet-salty 6 given Increase physical activity as tolerated Mindfulness stress reduction  Atypical chest pain-no chest pain today***.  Underwent coronary CTA and calcium scoring was 0 on 09/11/2019.  Underwent nuclear stress test which showed low risk. Continue amlodipine, atorvastatin Heart healthy low-sodium diet-salty 6 given Increase physical activity as tolerated  Disposition: Follow-up with Dr. Margaretann Loveless in 3 months.  COVID-19 Education: The signs and symptoms of COVID-19 were discussed with the patient and how to seek care for testing (follow up with PCP or arrange E-visit).  The importance of social distancing was discussed today.  Patient Risk:    After full review of this patients clinical status, I feel that they are at least moderate risk at this time.  Time:   Today, I have spent *** minutes with the patient with telehealth technology discussing ***.     Medication Adjustments/Labs and Tests Ordered: Current medicines are reviewed at length with the patient today.  Concerns regarding medicines are outlined above.   Tests Ordered: No orders of the defined types were placed in this encounter.  Medication Changes: No orders of the defined types were placed in this encounter.   Disposition:  {follow up:15908}  Signed, Jossie Ng. Loyal Holzheimer NP-C    02/18/2019 11:58 AM    [place] [address] Office [phone] Fax [phone]

## 2020-04-20 NOTE — Progress Notes (Deleted)
{Choose 1 Note Type (Telehealth Visit or Telephone Visit):[phone]}  Evaluation Performed:  Follow-up visit  This visit type was conducted due to national recommendations for restrictions regarding the COVID-19 Pandemic (e.g. social distancing).  This format is felt to be most appropriate for this patient at this time.  All issues noted in this document were discussed and addressed.  No physical exam was performed (except for noted visual exam findings with Video Visits).  Please refer to the patient's chart (MyChart message for video visits and phone note for telephone visits) for the patient's consent to telehealth for [name]  Date:  04/20/2020   ID:  [name], DOB [birth year], MRN [id number]  Patient Location: *** [address] [place] [address]   Provider location:     [place] [facility] [address] Office [phone] Fax [phone]   PCP:  Tamsen Roers, MD  Cardiologist:  Elouise Munroe, MD *** Electrophysiologist:  None   Chief Complaint:  Follow-up for blood pressure  History of Present Illness:    [name] is a 44 y.o. female who presents via audio/video conferencing for a telehealth visit today.  Patient verified DOB and address.  [name] is a 44 y.o. female who presents via audio/video conferencing for a telehealth visit today.  Patient verified DOB and address.  She has a past medical history of asthma, depression, type 1 diabetes, endometriosis, GERD, HLD, and dyspnea.  She was initially seen by Dr. Marcelline Mates in the emergency department 11/19.  During that time she described mid chest heaviness that radiated to her teeth, jaws, and left shoulder.  Her discomfort was noted with activity and was relieved with rest.  She also noted shortness of breath with exertion.  Her mother had coronary artery disease in the setting of type 1 diabetes as well.  She underwent nuclear stress test at  that time which showed low risk.  She also underwent coronary CTA due to continued atypical chest pain.  However, her heart rate was too high and quality images were not obtained during scan.  Her calcium score was 0.  She was worried that she was not able to complete the scan.  The calcium scoring was discussed and medical management was recommended as well as lifestyle modifications.  She was last seen by Dr. Marcelline Mates on 03/15/2020.  During that time her blood pressure was at goal.  Her amlodipine was in the process of being titrated.  Her heart rate remained elevated at 100 bpm.  Her anxiety and her smoking were discussed as being contributing factors to her tachycardia.  She is seen virtually today in follow-up and states***  *** denies chest pain, shortness of breath, lower extremity edema, fatigue, palpitations, melena, hematuria, hemoptysis, diaphoresis, weakness, presyncope, syncope, orthopnea, and PND.   The patient {does/does not:200015} symptoms concerning for COVID-19 infection (fever, chills, cough, or new SHORTNESS OF BREATH).    Prior CV studies:   The following studies were reviewed today:  Coronary CTA 09/11/2019 EXAM: OVER-READ INTERPRETATION  CT CHEST   The following report is an over-read performed by radiologist Dr. Alvino Blood [facility] on 09/12/2019. This over-read does not include interpretation of cardiac or coronary anatomy or pathology. The coronary calcium score interpretation by the cardiologist is attached.   COMPARISON:  CT 04/30/2018   FINDINGS: Limited view of the lung parenchyma demonstrates no suspicious nodularity. Airways are normal.   Limited view of the mediastinum  demonstrates no adenopathy. Esophagus normal.   Limited view of the upper abdomen unremarkable.   Limited view of the skeleton and chest wall is unremarkable.   IMPRESSION: No significant extracardiac findings.  FINDINGS: Non-cardiac: See separate report from  [facility].   Ascending Aorta: normal   Pericardium: Normal   Coronary arteries: Normal origin, no calcifications noted.   IMPRESSION: Coronary calcium score of 0.   Patient is low risk for near term coronary events  Nuclear stress test 05/27/2020  Nuclear stress EF: 70%.  Blood pressure demonstrated a normal response to exercise.  No T wave inversion was noted during stress.  There was no ST segment deviation noted during stress.  This is a low risk study.   Normal perfusion. LVEF 70% with normal wall motion. This is a low risk study  Past Medical History:  Diagnosis Date  . Anxiety state, unspecified 03/26/2009  . ASTHMA 02/02/2007  . Asthma   . DEPRESSION 02/16/2009  . DIABETES MELLITUS, TYPE I 02/02/2007  . Endometriosis   . GERD 12/21/2008  . Herpes    at 18  . Hyperlipidemia   . Lesion of vocal cord   . Osteoarthritis   . Shortness of breath 09/25/2007  . Vocal fold leukoplakia    Past Surgical History:  Procedure Laterality Date  . ABDOMINAL HYSTERECTOMY  2009   BSO  . DIRECT LARYNGOSCOPY N/A 08/17/2017   Procedure: DIRECT LARYNGOSCOPY WITH OPERATING TELESCOPE WITH BIOPSY;  Surgeon: Helayne Seminole, MD;  Location: [place];  Service: ENT;  Laterality: N/A;  . NASOPHARYNGEAL BIOPSY Right 08/17/2017   Procedure: NASOPHARYNGEAL BIOPSY RIGHT NASAL LESION;  Surgeon: Helayne Seminole, MD;  Location: [facility];  Service: ENT;  Laterality: Right;     No outpatient medications have been marked as taking for the 04/21/20 encounter (Appointment) with Deberah Pelton, NP.     Allergies:   Patient has no known allergies.   Social History   Tobacco Use  . Smoking status: Current Every Day Smoker    Packs/day: 1.50    Years: 25.00    Pack years: 37.50    Types: Cigarettes  . Smokeless tobacco: Never Used  Vaping Use  . Vaping Use: Former  . Quit date: 02/23/2017  . Devices: only used tobacco   Substance Use Topics  . Alcohol use: Not Currently     Alcohol/week: 0.0 standard drinks  . Drug use: No     Family Hx: The patient's family history includes Diabetes in her maternal aunt, maternal grandfather, and mother; Emphysema in her maternal uncle; Lung cancer in her maternal aunt. There is no history of Colon cancer, Kidney disease, Liver disease, Esophageal cancer, Rectal cancer, or Stomach cancer.  ROS:   Please see the history of present illness.     All other systems reviewed and are negative.   Labs/Other Tests and Data Reviewed:    Recent Labs: 09/11/2019: Creatinine, Ser 0.80   Recent Lipid Panel Lab Results  Component Value Date/Time   CHOL 220 (H) 05/12/2010 11:52 AM   TRIG 200.0 (H) 05/12/2010 11:52 AM   HDL 49.90 05/12/2010 11:52 AM   CHOLHDL 4 05/12/2010 11:52 AM   LDLDIRECT 142.7 05/12/2010 11:52 AM    Wt Readings from Last 3 Encounters:  03/15/20 121 lb (54.9 kg)  03/08/20 121 lb 3.2 oz (55 kg)  03/03/20 121 lb (54.9 kg)     Exam:    Vital Signs:  There were no vitals taken for this visit.  Well nourished, well developed female in no  acute distress.   ASSESSMENT & PLAN:    1. Essential hypertension-BP today***.  Well-controlled at home. Continue amlodipine, lisinopril Heart healthy low-sodium diet-salty 6 given Increase physical activity as tolerated  Tachycardia-heart rate today***.  Previously felt to be secondary to tobacco use and anxiety. Heart healthy low-sodium diet-salty 6 given Increase physical activity as tolerated Mindfulness stress reduction  Atypical chest pain-no chest pain today***.  Underwent coronary CTA and calcium scoring was 0 on 09/11/2019.  Underwent nuclear stress test which showed low risk. Continue amlodipine, atorvastatin Heart healthy low-sodium diet-salty 6 given Increase physical activity as tolerated  Disposition: Follow-up with Dr. Margaretann Loveless in 3 months.  COVID-19 Education: The signs and symptoms of COVID-19 were discussed with the patient and how to seek  care for testing (follow up with PCP or arrange E-visit).  The importance of social distancing was discussed today.  Patient Risk:   After full review of this patients clinical status, I feel that they are at least moderate risk at this time.  Time:   Today, I have spent *** minutes with the patient with telehealth technology discussing ***.     Medication Adjustments/Labs and Tests Ordered: Current medicines are reviewed at length with the patient today.  Concerns regarding medicines are outlined above.   Tests Ordered: No orders of the defined types were placed in this encounter.  Medication Changes: No orders of the defined types were placed in this encounter.   Disposition:  {follow up:15908}  Signed, Jossie Ng. Hazim Treadway NP-C    02/18/2019 11:58 AM    [place] [address] Office [phone] Fax [phone]

## 2020-04-21 ENCOUNTER — Telehealth: Payer: Medicaid Other | Admitting: General Practice

## 2020-04-22 ENCOUNTER — Ambulatory Visit: Payer: 59 | Admitting: Neurology

## 2020-04-22 ENCOUNTER — Telehealth: Payer: Self-pay | Admitting: Internal Medicine

## 2020-04-22 NOTE — Telephone Encounter (Signed)
Left message to call back Recommendations from Raquel sent to patient via MyChart Routed to primary nurse

## 2020-04-22 NOTE — Telephone Encounter (Signed)
    Pt is calling back, she said she got Jenna's message about her amlodipine. She asked how about her lisinopril (ZESTRIL) 10 MG tablet is there any changes for it

## 2020-04-22 NOTE — Telephone Encounter (Signed)
Pt c/o medication issue:  1. Name of Medication:  amLODipine (NORVASC) 10 MG tablet [[id number]]  lisinopril (ZESTRIL) 10 MG tablet [[id number]]   2. How are you currently taking this medication (dosage and times per day)? Take 1 tablet (10 mg total) by mouth daily.-   3. Are you having a reaction (difficulty breathing--STAT)? No   4. What is your medication issue? Pt stated that since she has been on these to meds she feels like it has increased her Anxiety.  She stated she is also have in nightmares that she never had before.  She is wanting to know if the dose can be lower or try a different med  Best number [phone]

## 2020-04-22 NOTE — Telephone Encounter (Signed)
Spoke with patient. Advised no changes to lisinopril were made. She is concerned about her dreams/anxiety. She was on requip and this was abruptly stopped by PCP about 1 month - she states she was denied a refill and had been on med for years. Suggested she consult PCP about her symptoms and this medication   Scheduled for virtual visit with J.Cleaver NP on 10/19 @ 1145am

## 2020-04-22 NOTE — Telephone Encounter (Signed)
Per telephone note on 9/27; Dr Margaretann Loveless decreased her amlodipine dose to 5mg  daily instead of 10mg  daily. Also overdue for HTN follow up. Patient cancelled 2 follow up appointments with J. Clerver.   Dr Margaretann Loveless" "Let's arrange a visit with [facility] hypertension clinic. OK to take half dose of amlodipine, 5 mg, until follow up and see how she feels."   Please follow Dr Delphina Cahill recommendations.  1. Decrease amlodipine to 5mg    2. Schedule follow up with HTN clinic or [facility].

## 2020-04-22 NOTE — Telephone Encounter (Signed)
Routed to pharmacy for med review to see if anything she is on can cause reported side effects

## 2020-04-23 ENCOUNTER — Other Ambulatory Visit: Payer: Self-pay | Admitting: Endocrinology

## 2020-04-23 DIAGNOSIS — IMO0002 Reserved for concepts with insufficient information to code with codable children: Secondary | ICD-10-CM

## 2020-04-23 DIAGNOSIS — E1065 Type 1 diabetes mellitus with hyperglycemia: Secondary | ICD-10-CM

## 2020-04-26 ENCOUNTER — Other Ambulatory Visit: Payer: Self-pay | Admitting: Endocrinology

## 2020-04-26 DIAGNOSIS — E1065 Type 1 diabetes mellitus with hyperglycemia: Secondary | ICD-10-CM

## 2020-04-26 DIAGNOSIS — IMO0002 Reserved for concepts with insufficient information to code with codable children: Secondary | ICD-10-CM

## 2020-05-03 NOTE — Progress Notes (Unsigned)
error 

## 2020-05-04 ENCOUNTER — Telehealth: Payer: Medicaid Other | Admitting: General Practice

## 2020-05-06 ENCOUNTER — Encounter (INDEPENDENT_AMBULATORY_CARE_PROVIDER_SITE_OTHER): Payer: Medicaid Other | Admitting: Ophthalmology

## 2020-05-12 ENCOUNTER — Ambulatory Visit: Payer: Medicaid Other | Admitting: Endocrinology

## 2020-05-18 ENCOUNTER — Encounter (INDEPENDENT_AMBULATORY_CARE_PROVIDER_SITE_OTHER): Payer: Medicaid Other | Admitting: Ophthalmology

## 2020-05-24 ENCOUNTER — Other Ambulatory Visit: Payer: Self-pay | Admitting: Endocrinology

## 2020-05-24 DIAGNOSIS — E1065 Type 1 diabetes mellitus with hyperglycemia: Secondary | ICD-10-CM

## 2020-05-24 DIAGNOSIS — IMO0002 Reserved for concepts with insufficient information to code with codable children: Secondary | ICD-10-CM

## 2020-05-28 ENCOUNTER — Other Ambulatory Visit: Payer: Self-pay | Admitting: Endocrinology

## 2020-05-28 DIAGNOSIS — IMO0002 Reserved for concepts with insufficient information to code with codable children: Secondary | ICD-10-CM

## 2020-05-31 ENCOUNTER — Telehealth: Payer: Self-pay | Admitting: Neurology

## 2020-05-31 NOTE — Telephone Encounter (Signed)
I wasn't able to reach pt by phone, I sent a mychart message letting her know of the change in appointment due to Dr. Krista Blue interviewing a neurologist at that time on 11/18.

## 2020-06-02 NOTE — Telephone Encounter (Signed)
Pt LVM, need to cancel appt for Jun 14, 2023, father has passed away, will call back to reschedule.

## 2020-06-03 ENCOUNTER — Ambulatory Visit: Payer: Medicaid Other | Admitting: Neurology

## 2020-06-07 ENCOUNTER — Ambulatory Visit: Payer: Medicaid Other | Admitting: Neurology

## 2020-06-14 ENCOUNTER — Other Ambulatory Visit: Payer: Self-pay | Admitting: Internal Medicine

## 2020-06-15 ENCOUNTER — Encounter (INDEPENDENT_AMBULATORY_CARE_PROVIDER_SITE_OTHER): Payer: Self-pay

## 2020-06-15 ENCOUNTER — Encounter (INDEPENDENT_AMBULATORY_CARE_PROVIDER_SITE_OTHER): Payer: Medicaid Other | Admitting: Ophthalmology

## 2020-06-22 IMAGING — CT CT ANGIO CHEST
4 of 7 series · 18 of 46 positions shown · IV contrast (APPLIED)
Comparison: Chest radiograph May 10, 2018

CLINICAL DATA: Chest pain

EXAM:
CT ANGIOGRAPHY CHEST WITH CONTRAST
TECHNIQUE: Initially, axial CT images were obtained through the chest without
intravenous contrast material administration. Multidetector CT
imaging of the chest was performed using the standard protocol
during bolus administration of intravenous contrast. Multiplanar CT
image reconstructions and MIPs were obtained to evaluate the
vascular anatomy.
CONTRAST:  75 mL OMNIPAQUE IOHEXOL 350 MG/ML SOLN

[Series 3: axial pre · axial · non-contrast · 0.56mm/px · z∈[-351,-131]mm · 5 of 66 slices shown]
[im 11/66  lung]
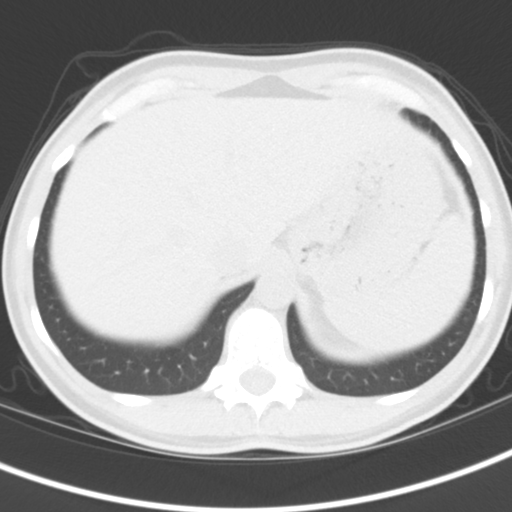
[im 22/66  lung]
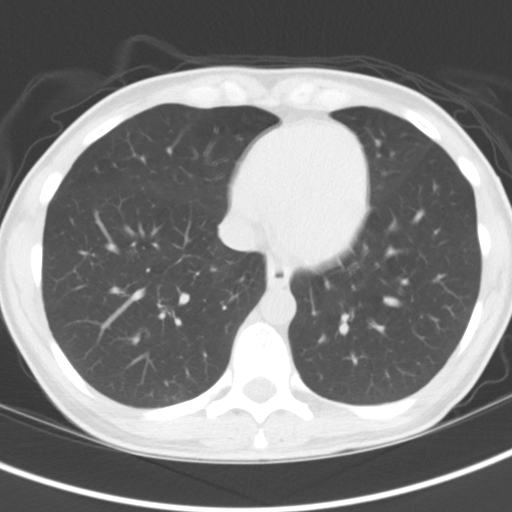
[im 33/66  lung]
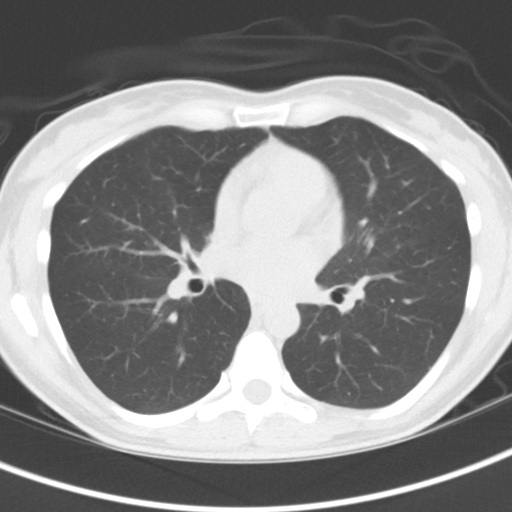
[im 44/66  lung]
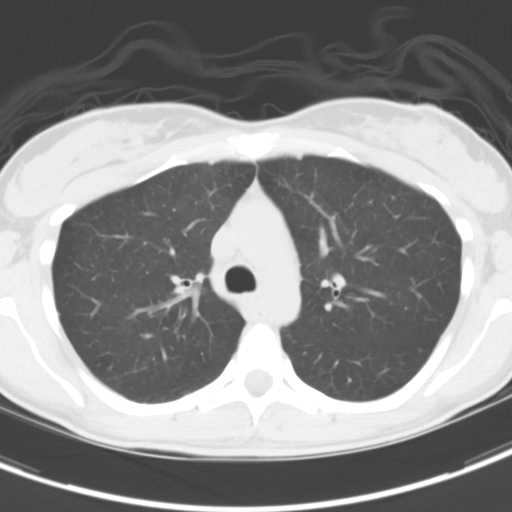
[im 55/66  lung]
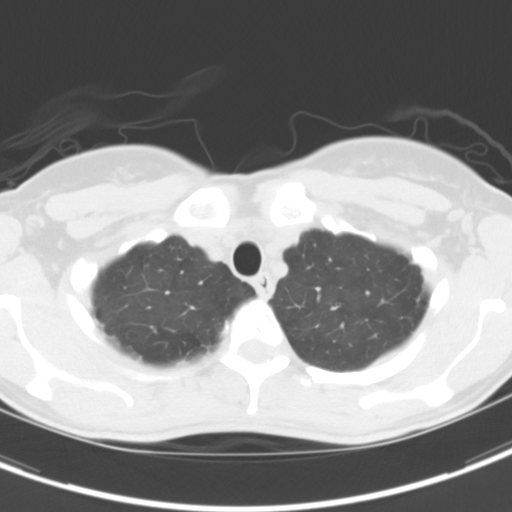

[Series 6: axial arterial · axial · arterial · 0.56mm/px · z∈[-367,-115]mm · 8 of 110 slices shown]
[im 13/110  lung]
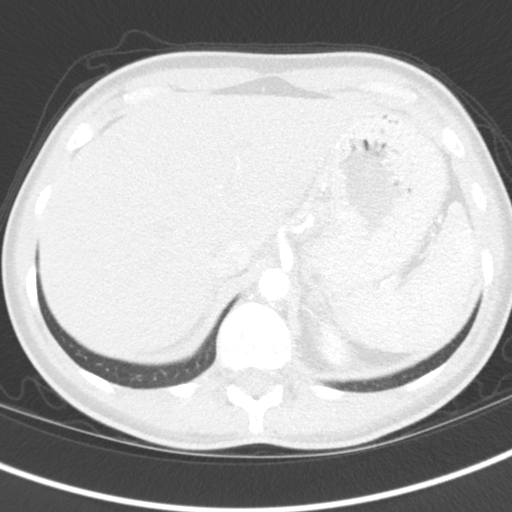
[im 25/110  soft-tissue]
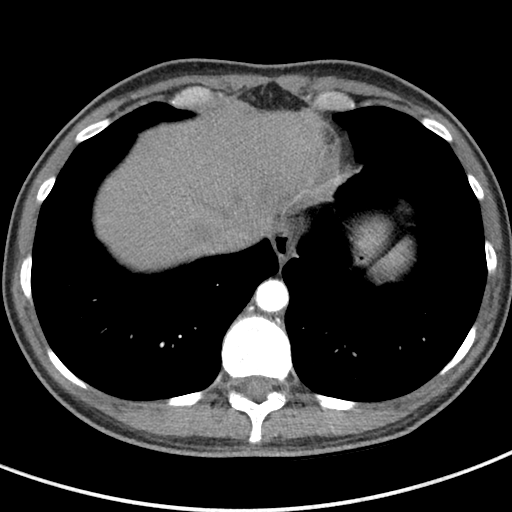
[im 37/110  lung]
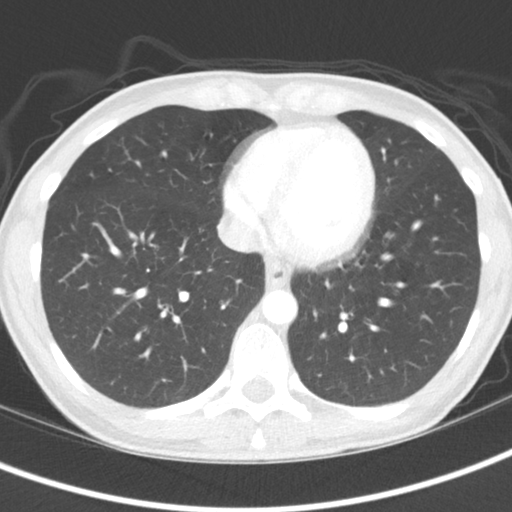
[im 49/110  soft-tissue]
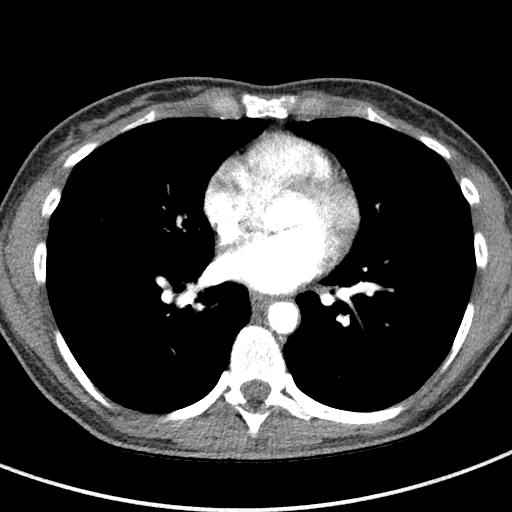
[im 61/110  lung]
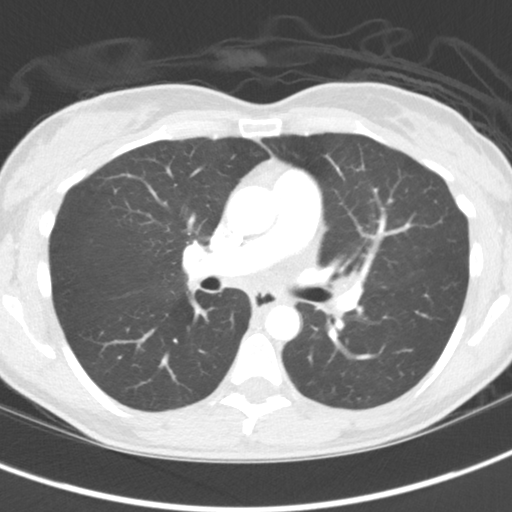
[im 73/110  soft-tissue]
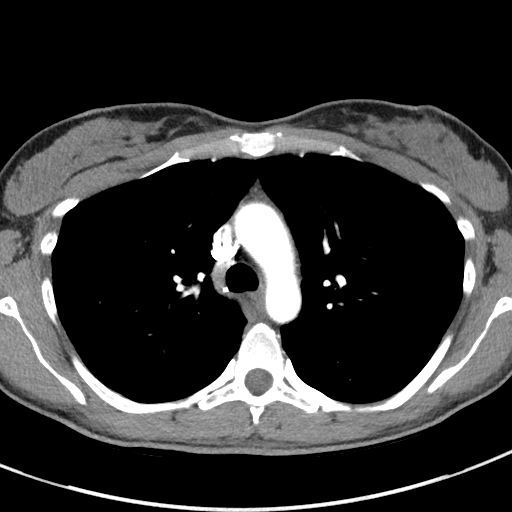
[im 85/110  lung]
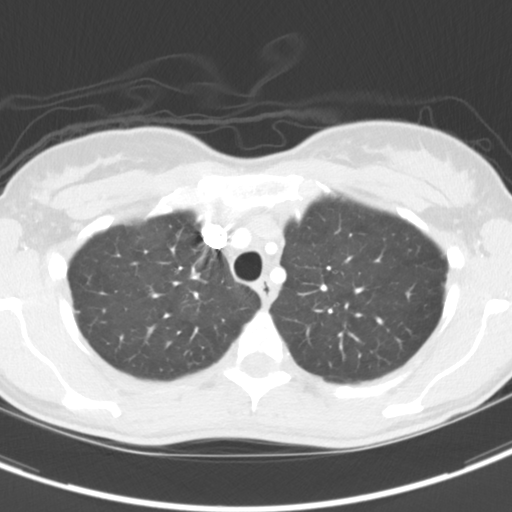
[im 97/110  soft-tissue]
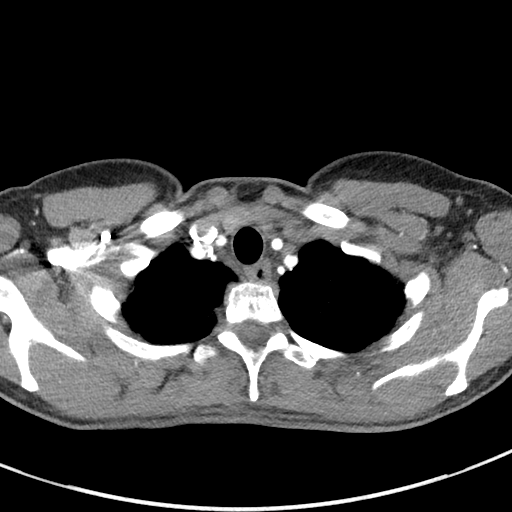

[Series 7: lung · axial · 0.56mm/px · z∈[-382,-334]mm · 2 of 165 slices shown]
[im 12/165  soft-tissue]
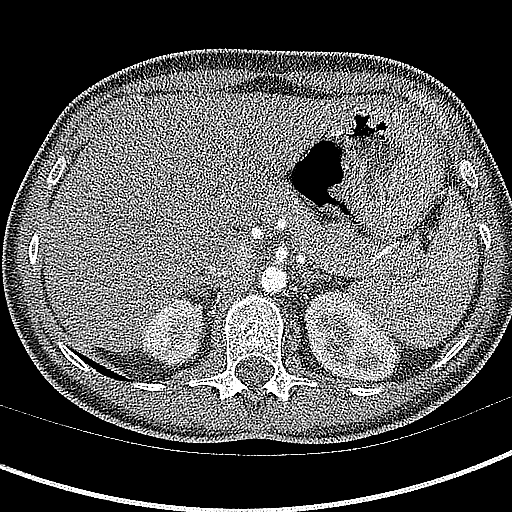
[im 36/165  soft-tissue]
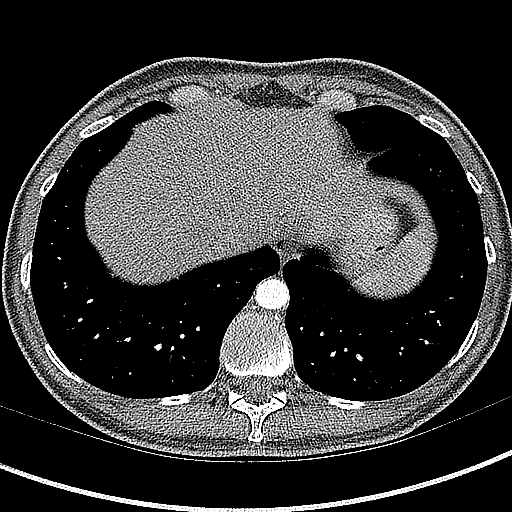

[Series 8: coronals · coronal · 0.62mm/px · 3 of 117 slices shown]
[im 30/117  soft-tissue]
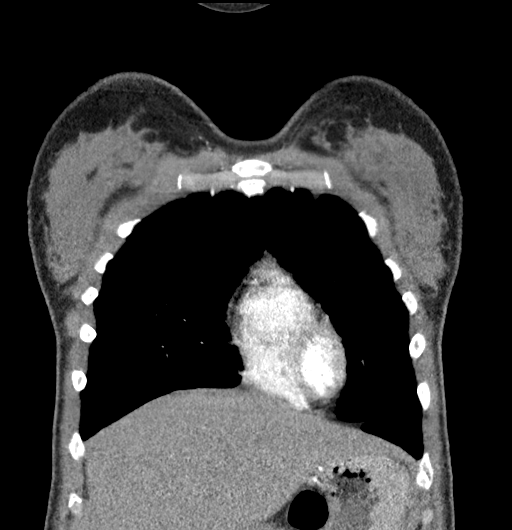
[im 59/117  soft-tissue]
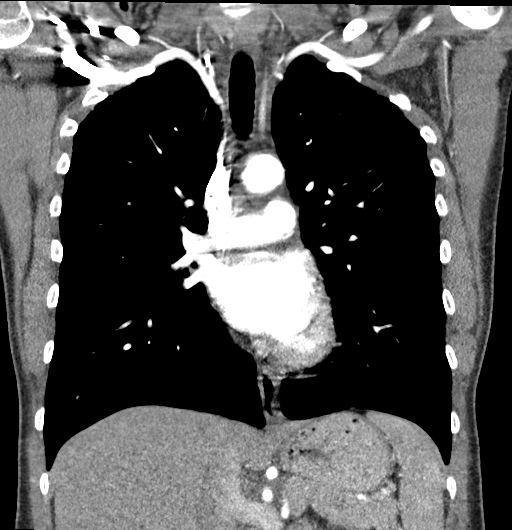
[im 88/117  soft-tissue]
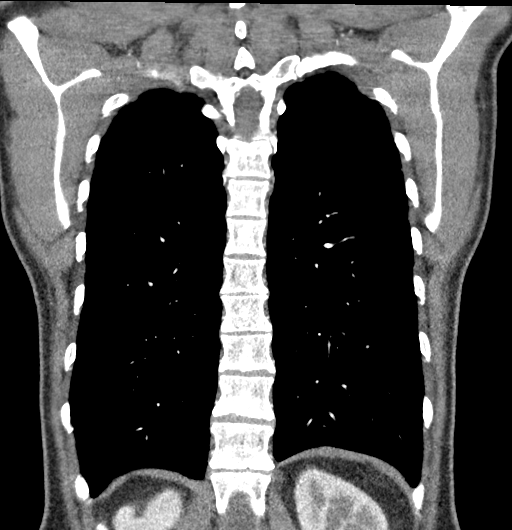

[18 of 46 positions shown; findings below may reference images not displayed]

FINDINGS: Cardiovascular: There is no demonstrable mediastinal hematoma on
noncontrast enhanced study. There is no evident thoracic aortic
aneurysm or dissection. The visualized great vessels appear normal.
There is no demonstrable pulmonary embolus. There is no pericardial
effusion or pericardial thickening.

Mediastinum/Nodes: Thyroid appears unremarkable. There is no
appreciable thoracic adenopathy. No esophageal lesions are evident.

Lungs/Pleura: There is scarring in the extreme apices. There is no
edema or consolidation. No pleural effusion or pleural thickening.

There is no nodular opacity in the right upper lobe toward the apex
by CT corresponding to the apparent 7 mm nodular opacity seen on
chest radiograph. On axial slice 51 series 7, there is a 2 mm
nodular opacity in the anterior segment of the left upper lobe.

Upper Abdomen: Visualized upper abdominal structures appear
unremarkable.

Musculoskeletal: There are no blastic or lytic bone lesions. There
is no evident chest wall lesion.

Review of the MIP images confirms the above findings.
IMPRESSION: 1. No thoracic aortic aneurysm or dissection. No demonstrable
pulmonary embolus.

2. No edema or consolidation. 2 mm nodular opacity in the left upper
lobe anteriorly. There is no nodular opacity in the right upper lobe
as well as question on recent chest radiograph. It is possible that
the nodular opacity question on chest radiograph represents a bone
island. No follow-up needed if patient is low-risk. Non-contrast
chest CT can be considered in 12 months if patient is high-risk.
This recommendation follows the consensus statement: Guidelines for
Management of Incidental Pulmonary Nodules Detected on CT Images:

3.  No evident thoracic adenopathy.

## 2020-06-28 ENCOUNTER — Other Ambulatory Visit: Payer: Self-pay | Admitting: Endocrinology

## 2020-06-28 DIAGNOSIS — IMO0002 Reserved for concepts with insufficient information to code with codable children: Secondary | ICD-10-CM

## 2020-07-19 ENCOUNTER — Telehealth: Payer: Self-pay | Admitting: Internal Medicine

## 2020-07-19 NOTE — Telephone Encounter (Signed)
*  STAT* If patient is at the pharmacy, call can be transferred to refill team.   1. Which medications need to be refilled? (please list name of each medication and dose if known)  lisinopril (ZESTRIL) 10 MG tablet    2. Which pharmacy/location (including street and city if local pharmacy) is medication to be sent to? [id number] - [place] - [address]   3. Do they need a 30 day or 90 day supply? 30  Patient is out of medication. Please advise.

## 2020-07-20 ENCOUNTER — Other Ambulatory Visit: Payer: Self-pay | Admitting: Endocrinology

## 2020-07-20 DIAGNOSIS — E1049 Type 1 diabetes mellitus with other diabetic neurological complication: Secondary | ICD-10-CM

## 2020-07-20 DIAGNOSIS — IMO0002 Reserved for concepts with insufficient information to code with codable children: Secondary | ICD-10-CM

## 2020-07-21 ENCOUNTER — Other Ambulatory Visit: Payer: Self-pay | Admitting: Gastroenterology

## 2020-07-21 MED ORDER — LISINOPRIL 10 MG PO TABS
10.0000 mg | ORAL_TABLET | Freq: Every day | ORAL | 3 refills | Status: DC
Start: 2020-07-21 — End: 2020-12-15

## 2020-07-21 NOTE — Telephone Encounter (Signed)
Refill for Lisinopril 10mg  daily has been sent in to the patient preferred pharmacy.   Attempted to call patient, left message on voicemail (okay per DPR) that the medication has been refilled and to call back to the office for any issues or concerns.

## 2020-07-21 NOTE — Telephone Encounter (Signed)
Patient called back because the medication refill was never sent to her pharmacy. Patient is currently out of medication

## 2020-07-30 ENCOUNTER — Other Ambulatory Visit: Payer: Self-pay | Admitting: Internal Medicine

## 2020-07-30 ENCOUNTER — Other Ambulatory Visit: Payer: Self-pay | Admitting: Endocrinology

## 2020-07-30 DIAGNOSIS — IMO0002 Reserved for concepts with insufficient information to code with codable children: Secondary | ICD-10-CM

## 2020-07-30 DIAGNOSIS — E1065 Type 1 diabetes mellitus with hyperglycemia: Secondary | ICD-10-CM

## 2020-08-07 ENCOUNTER — Other Ambulatory Visit: Payer: Self-pay | Admitting: Endocrinology

## 2020-08-07 DIAGNOSIS — E1049 Type 1 diabetes mellitus with other diabetic neurological complication: Secondary | ICD-10-CM

## 2020-08-07 DIAGNOSIS — IMO0002 Reserved for concepts with insufficient information to code with codable children: Secondary | ICD-10-CM

## 2020-08-07 NOTE — Telephone Encounter (Signed)
1.  Please schedule f/u appt 2.  Then please refill x 2 mos, pending that appt.  

## 2020-08-11 ENCOUNTER — Other Ambulatory Visit: Payer: Self-pay | Admitting: Endocrinology

## 2020-08-11 DIAGNOSIS — IMO0002 Reserved for concepts with insufficient information to code with codable children: Secondary | ICD-10-CM

## 2020-08-11 DIAGNOSIS — E1049 Type 1 diabetes mellitus with other diabetic neurological complication: Secondary | ICD-10-CM

## 2020-09-08 ENCOUNTER — Telehealth: Payer: Self-pay | Admitting: Endocrinology

## 2020-09-08 ENCOUNTER — Other Ambulatory Visit: Payer: Self-pay

## 2020-09-08 ENCOUNTER — Other Ambulatory Visit: Payer: Self-pay | Admitting: Endocrinology

## 2020-09-08 DIAGNOSIS — IMO0002 Reserved for concepts with insufficient information to code with codable children: Secondary | ICD-10-CM

## 2020-09-08 DIAGNOSIS — E1065 Type 1 diabetes mellitus with hyperglycemia: Secondary | ICD-10-CM

## 2020-09-08 NOTE — Telephone Encounter (Signed)
MEDICATION: Continuous Blood Gluc Sensor (DEXCOM G6 SENSOR) MISC  PHARMACY:   [name], [place] Phone:  [phone]  Fax:  [phone]       HAS THE PATIENT CONTACTED THEIR PHARMACY?  Yes  IS THIS A 90 DAY SUPPLY : ?  IS PATIENT OUT OF MEDICATION: Yes  IF NOT; HOW MUCH IS LEFT: 0  LAST APPOINTMENT DATE: @1 /26/2022  NEXT APPOINTMENT DATE:@2 /25/2022  DO WE HAVE YOUR PERMISSION TO LEAVE A DETAILED MESSAGE?: Yes  OTHER COMMENTS:    **Let patient know to contact pharmacy at the end of the day to make sure medication is ready. **  ** Please notify patient to allow 48-72 hours to process**  **Encourage patient to contact the pharmacy for refills or they can request refills through [facility]**

## 2020-09-09 ENCOUNTER — Other Ambulatory Visit: Payer: Self-pay | Admitting: *Deleted

## 2020-09-09 DIAGNOSIS — E1049 Type 1 diabetes mellitus with other diabetic neurological complication: Secondary | ICD-10-CM

## 2020-09-09 DIAGNOSIS — IMO0002 Reserved for concepts with insufficient information to code with codable children: Secondary | ICD-10-CM

## 2020-09-09 MED ORDER — DEXCOM G6 SENSOR MISC: 1.0000 | 2 refills | Status: DC

## 2020-09-09 NOTE — Telephone Encounter (Signed)
[facility]  

## 2020-09-10 ENCOUNTER — Ambulatory Visit: Payer: Medicaid Other | Admitting: Endocrinology

## 2020-09-14 NOTE — Telephone Encounter (Signed)
Please advise. Is patient supposed to have Humalog and Novolog on current medications list?

## 2020-09-15 ENCOUNTER — Telehealth: Payer: Self-pay | Admitting: Endocrinology

## 2020-09-15 NOTE — Telephone Encounter (Signed)
Please Advise. Pt has not been seen since 8/21

## 2020-09-15 NOTE — Telephone Encounter (Signed)
Patient called to request the following RX be sent asap (Patient states PHARM listed below sent Dr. [name] a request for the following medication with no response).  MEDICATION:  insulin aspart (NOVOLOG) 100 UNIT/ML injection  PHARMACY:   [id number] [name], [place] Phone:  [phone]  Fax:  [phone]      HAS THE PATIENT CONTACTED THEIR PHARMACY?  Yes (Please see 1st Paragraph)  IS THIS A 90 DAY SUPPLY : Patient would prefer 90 day supply  IS PATIENT OUT OF MEDICATION: No  IF NOT; HOW MUCH IS LEFT: Approx. 4 days  LAST APPOINTMENT DATE: @2 /24/2022  NEXT APPOINTMENT DATE:@3 /28/2022  DO WE HAVE YOUR PERMISSION TO LEAVE A DETAILED MESSAGE?: Yes  OTHER COMMENTS:    **Let patient know to contact pharmacy at the end of the day to make sure medication is ready. **  ** Please notify patient to allow 48-72 hours to process**  **Encourage patient to contact the pharmacy for refills or they can request refills through [facility]**

## 2020-09-16 ENCOUNTER — Other Ambulatory Visit: Payer: Self-pay

## 2020-09-16 DIAGNOSIS — IMO0002 Reserved for concepts with insufficient information to code with codable children: Secondary | ICD-10-CM

## 2020-09-16 DIAGNOSIS — E1065 Type 1 diabetes mellitus with hyperglycemia: Secondary | ICD-10-CM

## 2020-09-16 MED ORDER — INSULIN ASPART 100 UNIT/ML ~~LOC~~ SOLN: SUBCUTANEOUS | 2 refills | Status: DC

## 2020-09-16 MED ORDER — DEXCOM G6 TRANSMITTER MISC: 3 refills | Status: DC

## 2020-09-16 NOTE — Telephone Encounter (Signed)
1.  Please schedule f/u appt 2.  Then please refill x 2 mos, pending that appt.  

## 2020-09-17 NOTE — Telephone Encounter (Signed)
1.  Please schedule f/u appt 2.  Then please refill x 2 mos, pending that appt.  

## 2020-10-11 ENCOUNTER — Ambulatory Visit: Payer: Medicaid Other | Admitting: Endocrinology

## 2020-10-19 ENCOUNTER — Other Ambulatory Visit: Payer: Self-pay | Admitting: Gastroenterology

## 2020-11-03 ENCOUNTER — Other Ambulatory Visit: Payer: Self-pay | Admitting: Internal Medicine

## 2020-11-06 ENCOUNTER — Other Ambulatory Visit: Payer: Self-pay | Admitting: Endocrinology

## 2020-11-06 DIAGNOSIS — E1049 Type 1 diabetes mellitus with other diabetic neurological complication: Secondary | ICD-10-CM

## 2020-11-06 DIAGNOSIS — IMO0002 Reserved for concepts with insufficient information to code with codable children: Secondary | ICD-10-CM

## 2020-11-16 ENCOUNTER — Other Ambulatory Visit: Payer: Self-pay | Admitting: Gastroenterology

## 2020-11-23 ENCOUNTER — Other Ambulatory Visit: Payer: Self-pay | Admitting: Endocrinology

## 2020-12-15 MED ORDER — LISINOPRIL 10 MG PO TABS
10.0000 mg | ORAL_TABLET | Freq: Every day | ORAL | 1 refills | Status: DC
Start: 2020-12-15 — End: 2021-02-09

## 2020-12-24 ENCOUNTER — Other Ambulatory Visit: Payer: Self-pay | Admitting: Gastroenterology

## 2020-12-27 ENCOUNTER — Other Ambulatory Visit: Payer: Self-pay | Admitting: Endocrinology

## 2020-12-29 ENCOUNTER — Telehealth: Payer: Self-pay | Admitting: Pharmacy Technician

## 2020-12-29 NOTE — Telephone Encounter (Addendum)
Patient Advocate Encounter   Received notification from MEDICAID that prior authorization for [place] is required.   Forms signed and faxed 01/19/2021  Status is APPROVED  SENSOR-[id number] TRANSMITTER-[id number]  DATES:  01/20/2021-07/20/2021    [facility] will continue to follow.   Venida Jarvis. Nadara Mustard, CPhT Patient Advocate [facility] Phone: [phone] Fax:  [phone]

## 2020-12-30 ENCOUNTER — Other Ambulatory Visit: Payer: Self-pay | Admitting: Endocrinology

## 2020-12-30 ENCOUNTER — Other Ambulatory Visit: Payer: Self-pay | Admitting: Gastroenterology

## 2021-01-06 ENCOUNTER — Other Ambulatory Visit: Payer: Self-pay

## 2021-01-06 ENCOUNTER — Ambulatory Visit (INDEPENDENT_AMBULATORY_CARE_PROVIDER_SITE_OTHER): Payer: Medicaid Other | Admitting: Endocrinology

## 2021-01-06 VITALS — BP 136/74 | HR 91 | Ht 64.0 in | Wt 113.4 lb

## 2021-01-06 DIAGNOSIS — E1049 Type 1 diabetes mellitus with other diabetic neurological complication: Secondary | ICD-10-CM

## 2021-01-06 DIAGNOSIS — E1065 Type 1 diabetes mellitus with hyperglycemia: Secondary | ICD-10-CM | POA: Diagnosis not present

## 2021-01-06 DIAGNOSIS — IMO0002 Reserved for concepts with insufficient information to code with codable children: Secondary | ICD-10-CM

## 2021-01-06 LAB — POCT GLYCOSYLATED HEMOGLOBIN (HGB A1C): Hemoglobin A1C: 8.6 % — AB (ref 4.0–5.6)

## 2021-01-06 NOTE — Patient Instructions (Addendum)
Please take these pump settings:  basal rate of 0.2 units/hr.  bolus of 0.5-5 units/meal, but try to increase this to the higher part of this range, for a total of approx 8 units per day.      correction bolus (which some people call "sensitivity," or "insulin sensitivity ratio," or just "isr") of 1 unit for each 200 by which your glucose exceeds 150.  check your blood sugar 4 times a day: before the 3 meals, and at bedtime.  also check if you have symptoms of your blood sugar being too high or too low.  please keep a record of the readings and bring it to your next appointment here (or you can bring the meter itself).  You can write it on any piece of paper.  please call us sooner if your blood sugar goes below 70, or if you have a lot of readings over 200.   Please see Vaughan Basta, to consider a new pump.   Please come back for a follow-up appointment in 2 months.

## 2021-01-06 NOTE — Progress Notes (Signed)
Subjective:    Patient ID: [name], female    DOB: 08/18/1975, 45 y.o.   MRN: [id number]  HPI Pt returns for f/u of diabetes mellitus: DM type: 1 Dx'ed: 1751 Complications: polyneuropathy and retinopathy.  Therapy: insulin since dx, pump rx since 2017.  DKA: never Severe hypoglycemia: never Pancreatitis: never SDOH: therapy has been limited by chronic noncompliance.   Other:  she has had TAH; she eats at approx 10AM and 7PM. Interval history: she takes these pump settings: basal rate of 0.2 units/hr. bolus of 0.5-5 units/meal correction bolus (which some people call "sensitivity," or "insulin sensitivity ratio," or just "isr") of 1 unit for each 200 by which your glucose exceeds 150.  TDD is 14 units (46% basal, 41% meal bolus).  Control IQ in on 95% of the day.   Pt says she continues to have difficulty operating her pump, and requests a new one.  I reviewed continuous glucose monitor data.  Glucose varies from 100-375.  It is in general highest at 3PM and 10PM.  It is lowest at 8AM.  Past Medical History:  Diagnosis Date   Anxiety state, unspecified 03/26/2009   ASTHMA 02/02/2007   Asthma    DEPRESSION 02/16/2009   DIABETES MELLITUS, TYPE I 02/02/2007   Endometriosis    GERD 12/21/2008   Herpes    at 18   Hyperlipidemia    Lesion of vocal cord    Osteoarthritis    Shortness of breath 09/25/2007   Vocal fold leukoplakia     Past Surgical History:  Procedure Laterality Date   ABDOMINAL HYSTERECTOMY  2009   BSO   DIRECT LARYNGOSCOPY N/A 08/17/2017   Procedure: DIRECT LARYNGOSCOPY WITH OPERATING TELESCOPE WITH BIOPSY;  Surgeon: Helayne Seminole, MD;  Location: [place];  Service: ENT;  Laterality: N/A;   NASOPHARYNGEAL BIOPSY Right 08/17/2017   Procedure: NASOPHARYNGEAL BIOPSY RIGHT NASAL LESION;  Surgeon: Helayne Seminole, MD;  Location: [place];  Service: ENT;  Laterality: Right;    Social History   Socioeconomic History   Marital status: Legally Separated     Spouse name: Not on file   Number of children: 2   Years of education: Not on file   Highest education level: Not on file  Occupational History    Employer: UNEMPLOYED  Tobacco Use   Smoking status: Every Day    Packs/day: 1.50    Years: 25.00    Pack years: 37.50    Types: Cigarettes   Smokeless tobacco: Never  Vaping Use   Vaping Use: Former   Quit date: 02/23/2017   Devices: only used tobacco   Substance and Sexual Activity   Alcohol use: Not Currently    Alcohol/week: 0.0 standard drinks   Drug use: No   Sexual activity: Yes    Partners: Male    Birth control/protection: None    Comment: 1st intercourse-14, partners- 63, current partner- 4 months  Other Topics Concern   Not on file  Social History Narrative   2 children--pt has custody. Husband pays child support.   Daily Caffeine Use-3 2 liters a day   Pt does not get regular exercise.   Social Determinants of Health   Financial Resource Strain: Not on file  Food Insecurity: Not on file  Transportation Needs: Not on file  Physical Activity: Not on file  Stress: Not on file  Social Connections: Not on file  Intimate Partner Violence: Not on file    Current Outpatient Medications on File  Prior to Visit  Medication Sig Dispense Refill   Accu-Chek Softclix Lancets lancets 1 each by Other route 2 (two) times daily. E11.9 180 each 2   ALPRAZolam (XANAX) 1 MG tablet Take 1 mg by mouth 4 (four) times daily.     atorvastatin (LIPITOR) 10 MG tablet Take 1 tablet (10 mg total) by mouth at bedtime. 90 tablet 1   Cholecalciferol (VITAMIN D3) 50 MCG (2000 UT) capsule Take by mouth daily.      Continuous Blood Gluc Sensor (DEXCOM G6 SENSOR) MISC 1 each by Does not apply route See admin instructions. Change sensor every 10 days; E11.9 3 each 2   Continuous Blood Gluc Transmit (DEXCOM G6 TRANSMITTER) MISC Use as directed. Change every 90 days 1 each 3   fluticasone (FLOVENT HFA) 44 MCG/ACT inhaler Inhale 1 puff into the lungs 2  (two) times daily. 1 each 5   gabapentin (NEURONTIN) 300 MG capsule Take 1 capsule (300 mg total) by mouth 3 (three) times daily. As needed 270 capsule 1   glucagon 1 MG injection Inject 1 mg into the muscle once as needed. 1 each 12   glucose blood (ACCU-CHEK GUIDE) test strip 1 each by Other route in the morning and at bedtime. E11.9 180 each 3   HUMALOG KWIKPEN 100 UNIT/ML KwikPen INJECT 0.1 MLS (10 UNITS TOTAL) INTO THE SKIN 4 (FOUR) TIMES DAILY. 15 mL 11   Ibuprofen (ADVIL PO) Take 2-3 tablets by mouth every 6 (six) hours as needed.     insulin aspart (NOVOLOG) 100 UNIT/ML injection INJECT 40 UNITS DAILY PER INSULIN PUMP (MAX DOSAGE) 10 mL 2   lisinopril (ZESTRIL) 10 MG tablet Take 1 tablet (10 mg total) by mouth daily. 90 tablet 1   meclizine (ANTIVERT) 25 MG tablet Take 1 tablet (25 mg total) by mouth 3 (three) times daily as needed for dizziness. 30 tablet 0   Multiple Vitamin (MULTIVITAMIN WITH MINERALS) TABS tablet Take 1 tablet by mouth daily.     NOVOLOG 100 UNIT/ML injection INJECT 40 UNITS DAILY PER INSULIN PUMP (MAX DOSAGE) 6 mL 0   omeprazole (PRILOSEC) 40 MG capsule 1 CAP IN THE MORNING AND AT BEDTIME. PLEASE SCHEDULE A YEARLY FOLLOW UP FOR FURTHER REFILLS. 60 capsule 1   oxyCODONE-acetaminophen (PERCOCET) 10-325 MG tablet SMARTSIG:1 Pill By Mouth 1 to 4 Times Daily PRN     rOPINIRole (REQUIP) 1 MG tablet TAKE 1 TABLET BY MOUTH AT BEDTIME (PLZ SCHED APPT WITH NEW PROVIDER)  Please make appointment with Dr. Rex Kras.                 Needs to make appointment with Dr. Rex Kras. 90 tablet 0   valACYclovir (VALTREX) 500 MG tablet Take 1 tablet (500 mg total) by mouth 2 (two) times daily. (Patient taking differently: Take 500 mg by mouth as needed.) 10 tablet 3   zinc gluconate 50 MG tablet Take 50 mg by mouth daily.      amLODipine (NORVASC) 10 MG tablet Take 1 tablet (10 mg total) by mouth daily. 90 tablet 0   levalbuterol (XOPENEX HFA) 45 MCG/ACT inhaler Inhale 1-2 puffs  into the lungs 3 (three) times daily. 1 Inhaler 3   PARoxetine (PAXIL) 20 MG tablet Take 10 mg by mouth daily. Take 1/2 tablet (10mg  total) by mouth once daily.     sulfamethoxazole-trimethoprim (BACTRIM DS) 800-160 MG tablet Take 1 tablet by mouth 2 (two) times daily.     No current facility-administered medications on file prior to  visit.    No Known Allergies  Family History  Problem Relation Age of Onset   Diabetes Mother    Diabetes Maternal Aunt    Emphysema Maternal Uncle    Diabetes Maternal Grandfather    Lung cancer Maternal Aunt    Colon cancer Neg Hx    Kidney disease Neg Hx    Liver disease Neg Hx    Esophageal cancer Neg Hx    Rectal cancer Neg Hx    Stomach cancer Neg Hx     BP 136/74 (BP Location: Right Arm, Patient Position: Sitting, Cuff Size: Normal)   Pulse 91   Ht 5\' 4"  (1.626 m)   Wt 113 lb 6.4 oz (51.4 kg)   SpO2 97%   BMI 19.47 kg/m    Review of Systems She has lost weight.      Objective:   Physical Exam VITAL SIGNS:  See vs page GENERAL: no distress Pulses: dorsalis pedis intact bilat.   MSK: no deformity of the feet CV: no leg edema Skin:  no ulcer on the feet.  normal color and temp on the feet. Neuro: sensation is intact to touch on the feet    Lab Results  Component Value Date   HGBA1C 8.6 (A) 01/06/2021       Assessment & Plan:  Type 1 DM: uncontrolled.   Patient Instructions  Please take these pump settings:  basal rate of 0.2 units/hr.  bolus of 0.5-5 units/meal, but try to increase this to the higher part of this range, for a total of approx 8 units per day.      correction bolus (which some people call "sensitivity," or "insulin sensitivity ratio," or just "isr") of 1 unit for each 200 by which your glucose exceeds 150.  check your blood sugar 4 times a day: before the 3 meals, and at bedtime.  also check if you have symptoms of your blood sugar being too high or too low.  please keep a record of the readings and bring  it to your next appointment here (or you can bring the meter itself).  You can write it on any piece of paper.  please call us sooner if your blood sugar goes below 70, or if you have a lot of readings over 200.   Please see Vaughan Basta, to consider a new pump.   Please come back for a follow-up appointment in 2 months.

## 2021-01-26 ENCOUNTER — Other Ambulatory Visit: Payer: Self-pay | Admitting: Endocrinology

## 2021-01-31 ENCOUNTER — Other Ambulatory Visit: Payer: Self-pay | Admitting: Gastroenterology

## 2021-01-31 NOTE — Telephone Encounter (Signed)
Patient last seen 09-2019. Should schedule OV for further refills

## 2021-02-07 ENCOUNTER — Other Ambulatory Visit: Payer: Self-pay | Admitting: Internal Medicine

## 2021-02-09 MED ORDER — LISINOPRIL 10 MG PO TABS
10.0000 mg | ORAL_TABLET | Freq: Every day | ORAL | 0 refills | Status: DC
Start: 2021-02-09 — End: 2022-03-14

## 2021-02-15 ENCOUNTER — Telehealth: Payer: Self-pay | Admitting: Internal Medicine

## 2021-02-15 ENCOUNTER — Other Ambulatory Visit: Payer: Self-pay | Admitting: Pulmonary Disease

## 2021-02-15 NOTE — Telephone Encounter (Signed)
Attempted to call patient, left message for patient to call back to office.   

## 2021-02-15 NOTE — Telephone Encounter (Signed)
Follow Up:    Patient said she have been telling you the wrong medicine that need to be refilled. It is Amlodipine and not Lisinopril/    *STAT* If patient is at the pharmacy, call can be transferred to refill team.   1. Which medications need to be refilled? (please list name of each medication and dose if known)  Amlodipine  2. Which pharmacy/location (including street and city if local pharmacy) is medication to be sent to? [facility] [address] [place]  3. Do they need a 30 day or 90 day supply?  She can not remember how many she was getting- Pt need this call in today please- been out of this 3 days

## 2021-02-16 MED ORDER — AMLODIPINE BESYLATE 10 MG PO TABS: 10.0000 mg | ORAL_TABLET | Freq: Every day | ORAL | 0 refills | Status: DC

## 2021-02-17 MED ORDER — AMLODIPINE BESYLATE 5 MG PO TABS: 5.0000 mg | ORAL_TABLET | Freq: Every day | ORAL | 0 refills | Status: DC

## 2021-02-17 NOTE — Telephone Encounter (Signed)
Please see other [place] Encounter.

## 2021-02-23 ENCOUNTER — Other Ambulatory Visit: Payer: Self-pay | Admitting: Gastroenterology

## 2021-03-10 ENCOUNTER — Other Ambulatory Visit: Payer: Self-pay | Admitting: Gastroenterology

## 2021-04-02 ENCOUNTER — Other Ambulatory Visit: Payer: Self-pay | Admitting: Gastroenterology

## 2021-04-06 ENCOUNTER — Other Ambulatory Visit: Payer: Self-pay | Admitting: Pulmonary Disease

## 2021-04-06 ENCOUNTER — Other Ambulatory Visit: Payer: Self-pay | Admitting: Endocrinology

## 2021-05-03 ENCOUNTER — Emergency Department: Payer: Medicaid Other

## 2021-05-03 ENCOUNTER — Encounter: Payer: Self-pay | Admitting: *Deleted

## 2021-05-03 ENCOUNTER — Other Ambulatory Visit: Payer: Self-pay

## 2021-05-03 DIAGNOSIS — Z794 Long term (current) use of insulin: Secondary | ICD-10-CM | POA: Diagnosis not present

## 2021-05-03 DIAGNOSIS — Z79899 Other long term (current) drug therapy: Secondary | ICD-10-CM | POA: Insufficient documentation

## 2021-05-03 DIAGNOSIS — Z7951 Long term (current) use of inhaled steroids: Secondary | ICD-10-CM | POA: Insufficient documentation

## 2021-05-03 DIAGNOSIS — E1040 Type 1 diabetes mellitus with diabetic neuropathy, unspecified: Secondary | ICD-10-CM | POA: Diagnosis not present

## 2021-05-03 DIAGNOSIS — F13239 Sedative, hypnotic or anxiolytic dependence with withdrawal, unspecified: Secondary | ICD-10-CM | POA: Insufficient documentation

## 2021-05-03 DIAGNOSIS — I10 Essential (primary) hypertension: Secondary | ICD-10-CM | POA: Diagnosis not present

## 2021-05-03 DIAGNOSIS — J45909 Unspecified asthma, uncomplicated: Secondary | ICD-10-CM | POA: Diagnosis not present

## 2021-05-03 DIAGNOSIS — E1049 Type 1 diabetes mellitus with other diabetic neurological complication: Secondary | ICD-10-CM | POA: Insufficient documentation

## 2021-05-03 DIAGNOSIS — E103493 Type 1 diabetes mellitus with severe nonproliferative diabetic retinopathy without macular edema, bilateral: Secondary | ICD-10-CM | POA: Diagnosis not present

## 2021-05-03 DIAGNOSIS — R112 Nausea with vomiting, unspecified: Secondary | ICD-10-CM | POA: Diagnosis present

## 2021-05-03 DIAGNOSIS — F17210 Nicotine dependence, cigarettes, uncomplicated: Secondary | ICD-10-CM | POA: Diagnosis not present

## 2021-05-03 LAB — LIPASE, BLOOD: Lipase: 26 U/L (ref 11–51)

## 2021-05-03 LAB — BASIC METABOLIC PANEL
Anion gap: 11 (ref 5–15)
BUN: 5 mg/dL — ABNORMAL LOW (ref 6–20)
CO2: 26 mmol/L (ref 22–32)
Calcium: 9.8 mg/dL (ref 8.9–10.3)
Chloride: 86 mmol/L — ABNORMAL LOW (ref 98–111)
Creatinine, Ser: 0.56 mg/dL (ref 0.44–1.00)
GFR, Estimated: >60 mL/min (ref >60)
Glucose, Bld: 244 mg/dL — ABNORMAL HIGH (ref 70–99)
Potassium: 4.3 mmol/L (ref 3.5–5.1)
Sodium: 123 mmol/L — ABNORMAL LOW (ref 135–145)

## 2021-05-03 LAB — CBC
HCT: 41.4 % (ref 36.0–46.0)
Hemoglobin: 15.2 g/dL — ABNORMAL HIGH (ref 12.0–15.0)
MCH: 32.3 pg (ref 26.0–34.0)
MCHC: 36.7 g/dL — ABNORMAL HIGH (ref 30.0–36.0)
MCV: 87.9 fL (ref 80.0–100.0)
Platelets: 263 10*3/uL (ref 150–400)
RBC: 4.71 MIL/uL (ref 3.87–5.11)
RDW: 12.6 % (ref 11.5–15.5)
WBC: 12.1 10*3/uL — ABNORMAL HIGH (ref 4.0–10.5)
nRBC: 0 % (ref 0.0–0.2)

## 2021-05-03 LAB — HEPATIC FUNCTION PANEL
ALT: 14 U/L (ref 0–44)
AST: 14 U/L — ABNORMAL LOW (ref 15–41)
Albumin: 4.9 g/dL (ref 3.5–5.0)
Alkaline Phosphatase: 82 U/L (ref 38–126)
Bilirubin, Direct: 0.2 mg/dL (ref 0.0–0.2)
Indirect Bilirubin: 0.6 mg/dL (ref 0.3–0.9)
Total Bilirubin: 0.8 mg/dL (ref 0.3–1.2)
Total Protein: 8.1 g/dL (ref 6.5–8.1)

## 2021-05-03 LAB — TROPONIN I (HIGH SENSITIVITY): Troponin I (High Sensitivity): 6 ng/L (ref <18)

## 2021-05-03 NOTE — ED Triage Notes (Signed)
Pt has pain in the center of chest.  No sob.  Pt reports feeling anxious.  Pt has n/v since last night.  Cig smoker.  No cough.  Pt alert  speech clear.

## 2021-05-04 ENCOUNTER — Emergency Department
Admission: EM | Admit: 2021-05-04 | Discharge: 2021-05-04 | Disposition: A | Discharge status: Home / Self Care | Payer: Medicaid Other | Attending: Emergency Medicine | Admitting: Emergency Medicine

## 2021-05-04 DIAGNOSIS — R112 Nausea with vomiting, unspecified: Secondary | ICD-10-CM

## 2021-05-04 DIAGNOSIS — F13930 Sedative, hypnotic or anxiolytic use, unspecified with withdrawal, uncomplicated: Secondary | ICD-10-CM

## 2021-05-04 LAB — TROPONIN I (HIGH SENSITIVITY): Troponin I (High Sensitivity): 6 ng/L (ref <18)

## 2021-05-04 MED ORDER — ONDANSETRON 4 MG PO TBDP
4.0000 mg | ORAL_TABLET | Freq: Three times a day (TID) | ORAL | 0 refills | Status: DC | PRN
Start: 2021-05-04 — End: 2022-03-14

## 2021-05-04 MED ORDER — ALPRAZOLAM 1 MG PO TABS: ORAL_TABLET | ORAL | 0 refills | Status: AC

## 2021-05-04 MED ORDER — SODIUM CHLORIDE 0.9 % IV BOLUS
1000.0000 mL | Freq: Once | INTRAVENOUS | Status: AC
  Administered 2021-05-04: 1000 mL via INTRAVENOUS

## 2021-05-04 MED ORDER — ALPRAZOLAM 0.5 MG PO TABS
1.0000 mg | ORAL_TABLET | Freq: Once | ORAL | Status: AC
  Administered 2021-05-04: 1 mg via ORAL
  Filled 2021-05-04: qty 4

## 2021-05-04 MED ORDER — ONDANSETRON HCL 4 MG/2ML IJ SOLN
4.0000 mg | Freq: Once | INTRAMUSCULAR | Status: AC
  Administered 2021-05-04: 4 mg via INTRAVENOUS
  Filled 2021-05-04: qty 2

## 2021-05-04 NOTE — ED Provider Notes (Signed)
[facility] Emergency Department Provider Note   ____________________________________________   Event Date/Time   First MD Initiated Contact with Patient 05/04/21 [phone]     (approximate)  I have reviewed the triage vital signs and the nursing notes.   HISTORY  Chief Complaint Chest Pain    HPI [name] is a 45 y.o. female who presents for nausea and vomiting  LOCATION: Abdomen DURATION: 1 day prior to arrival TIMING: Stable since onset SEVERITY: Moderate QUALITY: Nausea vomiting CONTEXT: Patient states that she has been attempting to taper off her Xanax medication after she lost a full dose by throwing it into the trash can approximately 3 days prior to arrival.  Since she has been trying to taper down her Xanax dose she has been having increasing anxiety with associated nausea/vomiting, palpitations, and tremulousness MODIFYING FACTORS: Decreased dosage of Xanax worsens these symptoms and she denies any relieving factors ASSOCIATED SYMPTOMS: Palpitations, anxiety, tremulousness   Per medical record review, patient has history of anxiety and has a prescription for 1 mg Xanax 4 times a day          Past Medical History:  Diagnosis Date   Anxiety state, unspecified 03/26/2009   ASTHMA 02/02/2007   Asthma    DEPRESSION 02/16/2009   DIABETES MELLITUS, TYPE I 02/02/2007   Endometriosis    GERD 12/21/2008   Herpes    at 18   Hyperlipidemia    Lesion of vocal cord    Osteoarthritis    Shortness of breath 09/25/2007   Vocal fold leukoplakia     Patient Active Problem List   Diagnosis Date Noted   Hypertension 03/03/2020   Primary osteoarthritis of both knees 11/30/2019   Primary osteoarthritis of both feet 11/30/2019   Primary osteoarthritis of both hands 11/30/2019   Severe nonproliferative diabetic retinopathy of right eye, without macular edema, associated with type 1 diabetes mellitus ([place]) 11/04/2019   Severe nonproliferative  diabetic retinopathy of left eye, without macular edema, associated with type 1 diabetes mellitus ([place]) 11/04/2019   Retinal hemorrhage of right eye 11/04/2019   Chronic pain syndrome 11/13/2018   Dizziness 09/18/2018   Arthralgia 08/14/2018   Mild intermittent asthma without complication 02/58/5277   Snoring 08/12/2018   Breast lump 08/12/2018   Lung nodule 08/12/2018   Chest wall pain 08/02/2018   Eczema 08/02/2018   Diabetic peripheral neuropathy ([place]) 01/11/2018   Low back pain with left-sided sciatica 11/29/2017   Peripheral polyneuropathy 11/29/2017   Multinodular goiter 07/29/2015   Type 1 diabetes mellitus with neurological manifestations, uncontrolled 01/27/2014   Restless leg 11/07/2013   Cholesteatoma 09/11/2011   Allergy to environmental factors 09/11/2011   Anxiety 05/01/2011   Acid reflux 04/01/2010   Absence of bladder continence 04/01/2010   INSOMNIA 02/18/2010   GERD 12/21/2008   SMOKER 09/25/2007    Past Surgical History:  Procedure Laterality Date   ABDOMINAL HYSTERECTOMY  2009   BSO   DIRECT LARYNGOSCOPY N/A 08/17/2017   Procedure: DIRECT LARYNGOSCOPY WITH OPERATING TELESCOPE WITH BIOPSY;  Surgeon: Helayne Seminole, MD;  Location: [facility];  Service: ENT;  Laterality: N/A;   NASOPHARYNGEAL BIOPSY Right 08/17/2017   Procedure: NASOPHARYNGEAL BIOPSY RIGHT NASAL LESION;  Surgeon: Helayne Seminole, MD;  Location: [facility] OR;  Service: ENT;  Laterality: Right;    Prior to Admission medications   Medication Sig Start Date End Date Taking? Authorizing Provider  ALPRAZolam Duanne Moron) 1 MG tablet Take 1 tablet (1 mg total) by mouth 2 (  two) times daily for 3 days, THEN 1 tablet (1 mg total) at bedtime for 2 days. 05/04/21 05/09/21 Yes Georgios Kina, Vista Lawman, MD  ondansetron (ZOFRAN ODT) 4 MG disintegrating tablet Take 1 tablet (4 mg total) by mouth every 8 (eight) hours as needed for nausea or vomiting. 05/04/21  Yes Naaman Plummer, MD  Accu-Chek Softclix Lancets lancets 1 each  by Other route 2 (two) times daily. E11.9 03/08/20   Renato Shin, MD  amLODipine (NORVASC) 5 MG tablet Take 1 tablet (5 mg total) by mouth daily. Please make follow up appointment for future refills. 02/17/21 05/18/21  Elouise Munroe, MD  atorvastatin (LIPITOR) 10 MG tablet Take 1 tablet (10 mg total) by mouth at bedtime. 09/26/18   Luetta Nutting, DO  Cholecalciferol (VITAMIN D3) 50 MCG (2000 UT) capsule Take by mouth daily.     [provider]  Continuous Blood Gluc Sensor (DEXCOM G6 SENSOR) MISC 1 each by Does not apply route See admin instructions. Change sensor every 10 days; E11.9 09/09/20   Renato Shin, MD  Continuous Blood Gluc Transmit (DEXCOM G6 TRANSMITTER) MISC Use as directed. Change every 90 days 09/16/20   Renato Shin, MD  fluticasone (FLOVENT HFA) 110 MCG/ACT inhaler INHALE 2 PUFFS INTO THE LUNGS IN THE MORNING AND AT BEDTIME. 04/06/21   Chesley Mires, MD  fluticasone (FLOVENT HFA) 44 MCG/ACT inhaler Inhale 1 puff into the lungs 2 (two) times daily. 02/26/20   Martyn Ehrich, NP  gabapentin (NEURONTIN) 300 MG capsule Take 1 capsule (300 mg total) by mouth 3 (three) times daily. As needed 09/26/18   Luetta Nutting, DO  glucagon 1 MG injection Inject 1 mg into the muscle once as needed. 01/05/20   Renato Shin, MD  glucose blood (ACCU-CHEK GUIDE) test strip 1 each by Other route in the morning and at bedtime. E11.9 03/08/20   Renato Shin, MD  HUMALOG KWIKPEN 100 UNIT/ML KwikPen INJECT 0.1 MLS (10 UNITS TOTAL) INTO THE SKIN 4 (FOUR) TIMES DAILY. 03/22/20   Renato Shin, MD  Ibuprofen (ADVIL PO) Take 2-3 tablets by mouth every 6 (six) hours as needed.    [provider]  insulin aspart (NOVOLOG) 100 UNIT/ML injection INJECT 40 UNITS DAILY PER INSULIN PUMP (MAX DOSAGE) 09/16/20   Renato Shin, MD  levalbuterol [facility] HFA) 45 MCG/ACT inhaler Inhale 1-2 puffs into the lungs 3 (three) times daily. 02/18/20   Martyn Ehrich, NP  lisinopril (ZESTRIL) 10 MG tablet Take 1  tablet (10 mg total) by mouth daily. 02/09/21   Elouise Munroe, MD  meclizine (ANTIVERT) 25 MG tablet Take 1 tablet (25 mg total) by mouth 3 (three) times daily as needed for dizziness. 09/18/18   Luetta Nutting, DO  Multiple Vitamin (MULTIVITAMIN WITH MINERALS) TABS tablet Take 1 tablet by mouth daily.    [provider]  NOVOLOG 100 UNIT/ML injection INJECT 40 UNITS DAILY PER INSULIN PUMP (MAX DOSAGE) 01/26/21   Renato Shin, MD  omeprazole (PRILOSEC) 40 MG capsule 1 CAP IN THE MORNING AND AT BEDTIME. PLEASE SCHEDULE A YEARLY FOLLOW UP FOR FURTHER REFILLS. 12/30/20   Armbruster, Carlota Raspberry, MD  oxyCODONE-acetaminophen (PERCOCET) 10-325 MG tablet SMARTSIG:1 Pill By Mouth 1 to 4 Times Daily PRN 07/15/19   [provider]  PARoxetine (PAXIL) 20 MG tablet Take 10 mg by mouth daily. Take 1/2 tablet (10mg  total) by mouth once daily. 10/07/19   [provider]  rOPINIRole (REQUIP) 1 MG tablet TAKE 1 TABLET BY MOUTH AT BEDTIME (PLZ  SCHED APPT WITH NEW PROVIDER)  Please make appointment with Dr. Rex Kras.                 Needs to make appointment with Dr. Rex Kras. 01/26/20   [name], MD  sulfamethoxazole-trimethoprim (BACTRIM DS) 800-160 MG tablet Take 1 tablet by mouth 2 (two) times daily.    [provider]  valACYclovir (VALTREX) 500 MG tablet Take 1 tablet (500 mg total) by mouth 2 (two) times daily. Patient taking differently: Take 500 mg by mouth as needed. 12/11/19   Princess Bruins, MD  zinc gluconate 50 MG tablet Take 50 mg by mouth daily.     [provider]    Allergies Patient has no known allergies.  Family History  Problem Relation Age of Onset   Diabetes Mother    Diabetes Maternal Aunt    Emphysema Maternal Uncle    Diabetes Maternal Grandfather    Lung cancer Maternal Aunt    Colon cancer Neg Hx    Kidney disease Neg Hx    Liver disease Neg Hx    Esophageal cancer Neg Hx    Rectal cancer Neg Hx    Stomach  cancer Neg Hx     Social History Social History   Tobacco Use   Smoking status: Every Day    Packs/day: 1.50    Years: 25.00    Pack years: 37.50    Types: Cigarettes   Smokeless tobacco: Never  Vaping Use   Vaping Use: Former   Quit date: 02/23/2017   Devices: only used tobacco   Substance Use Topics   Alcohol use: Not Currently    Alcohol/week: 0.0 standard drinks   Drug use: No    Review of Systems Constitutional: No fever/chills Eyes: No visual changes. ENT: No sore throat. Cardiovascular: Denies chest pain.  Endorses palpitations Respiratory: Denies shortness of breath. Gastrointestinal: No abdominal pain.  Endorses nausea and vomiting.  No diarrhea. Genitourinary: Negative for dysuria. Musculoskeletal: Negative for acute arthralgias Skin: Negative for rash. Neurological: Negative for headaches, weakness/numbness/paresthesias in any extremity Psychiatric: Negative for suicidal ideation/homicidal ideation   ____________________________________________   PHYSICAL EXAM:  VITAL SIGNS: ED Triage Vitals  Enc Vitals Group     BP 05/03/21 2153 (!) 190/90     Pulse Rate 05/03/21 2153 (!) 111     Resp 05/03/21 2153 16     Temp 05/03/21 2153 98.4 F (36.9 C)     Temp Source 05/03/21 2153 Oral     SpO2 05/03/21 2153 96 %     Weight 05/03/21 2154 113 lb (51.3 kg)     Height 05/03/21 2154 5\' 4"  (1.626 m)     Head Circumference --      Peak Flow --      Pain Score 05/03/21 2154 0     Pain Loc --      Pain Edu? --      Excl. in [place]? --    Constitutional: Alert and oriented. Well appearing and in no acute distress. Eyes: Conjunctivae are normal. PERRL. Head: Atraumatic. Nose: No congestion/rhinnorhea. Mouth/Throat: Mucous membranes are moist. Neck: No stridor Cardiovascular: Grossly normal heart sounds.  Good peripheral circulation. Respiratory: Normal respiratory effort.  No retractions. Gastrointestinal: Soft and nontender. No distention. Musculoskeletal: No  obvious deformities Neurologic:  Normal speech and language. No gross focal neurologic deficits are appreciated. Skin:  Skin is warm and dry. No rash noted. Psychiatric: Mood and affect are normal. Speech and behavior are normal.  ____________________________________________  LABS (all labs ordered are listed, but only abnormal results are displayed)  Labs Reviewed  BASIC METABOLIC PANEL - Abnormal; Notable for the following components:      Result Value   Sodium 123 (*)    Chloride 86 (*)    Glucose, Bld 244 (*)    BUN 5 (*)    All other components within normal limits  CBC - Abnormal; Notable for the following components:   WBC 12.1 (*)    Hemoglobin 15.2 (*)    MCHC 36.7 (*)    All other components within normal limits  HEPATIC FUNCTION PANEL - Abnormal; Notable for the following components:   AST 14 (*)    All other components within normal limits  LIPASE, BLOOD  TROPONIN I (HIGH SENSITIVITY)  TROPONIN I (HIGH SENSITIVITY)   ____________________________________________  EKG  ED ECG REPORT I, Naaman Plummer, the attending physician, personally viewed and interpreted this ECG.  Date: 05/04/2021 EKG Time: 2156 Rate: 104 Rhythm: Tachycardic sinus rhythm QRS Axis: normal Intervals: normal ST/T Wave abnormalities: normal Narrative Interpretation: Tachycardic sinus rhythm.  No evidence of acute ischemia  ____________________________________________  RADIOLOGY  ED MD interpretation: 2 view chest x-ray shows no evidence of acute abnormalities including no pneumonia, pneumothorax, or widened mediastinum  Official radiology report(s): DG Chest 2 View  Result Date: 05/03/2021 CLINICAL DATA:  Chest pain EXAM: CHEST - 2 VIEW COMPARISON:  02/12/2019 FINDINGS: Lungs are clear.  No pleural effusion or pneumothorax. The heart is normal in size. Visualized osseous structures are within normal limits. IMPRESSION: Normal chest radiographs. Electronically Signed   By: Julian Hy M.D.   On: 05/03/2021 22:18    ____________________________________________   PROCEDURES  Procedure(s) performed (including Critical Care):  .1-3 Lead EKG Interpretation Performed by: Naaman Plummer, MD Authorized by: Naaman Plummer, MD     Interpretation: normal     ECG rate:  89   ECG rate assessment: normal     Rhythm: sinus rhythm     Ectopy: none     Conduction: normal     ____________________________________________   INITIAL IMPRESSION / ASSESSMENT AND PLAN / ED COURSE  As part of my medical decision making, I reviewed the following data within the electronic medical record, if available:  Nursing notes reviewed and incorporated, Labs reviewed, EKG interpreted, Old chart reviewed, Radiograph reviewed and Notes from prior ED visits reviewed and incorporated        Patient is a 45 year old female who presents with signs and symptoms consistent with benzodiazepine withdrawal in the setting of decreased benzodiazepine dosage after years being on 4 mg/day.  At this point I have low suspicion for appendicitis, cholecystitis, other intra-abdominal infections, food poisoning, or sympathomimetic intoxication.  I discussed with patient's the importance of tapering off her Xanax dosage as her primary care doctor will not write her for a repeat prescription for 30 days of Xanax.  Patient agrees that she would like to take a "medication holiday" from the Xanax and therefore will call in a prescription to her pharmacy for a Xanax taper over the next 5 days.  Patient given strict return precautions and agrees with plan for discharge home and follow-up with her primary care physician as soon as possible  Dispo: Discharge home with PCP follow-up      ____________________________________________   FINAL CLINICAL IMPRESSION(S) / ED DIAGNOSES  Final diagnoses:  Benzodiazepine withdrawal without complication (HCC)  Nausea and vomiting, unspecified vomiting type     ED  Discharge Orders          Ordered    ALPRAZolam (XANAX) 1 MG tablet        05/04/21 1026    ondansetron (ZOFRAN ODT) 4 MG disintegrating tablet  Every 8 hours PRN        05/04/21 1026             Note:  This document was prepared using Dragon voice recognition software and may include unintentional dictation errors.    Naaman Plummer, MD 05/04/21 [phone]

## 2021-05-04 NOTE — ED Notes (Signed)
See triage note  presents with pain to upper chest intermittently for chest days   no fever or cough  also has had h/a

## 2021-05-09 ENCOUNTER — Other Ambulatory Visit: Payer: Self-pay | Admitting: Internal Medicine

## 2021-05-11 ENCOUNTER — Emergency Department
Admission: EM | Admit: 2021-05-11 | Discharge: 2021-05-11 | Disposition: A | Discharge status: Home / Self Care | Payer: Medicaid Other | Attending: Emergency Medicine | Admitting: Emergency Medicine

## 2021-05-11 ENCOUNTER — Other Ambulatory Visit: Payer: Self-pay

## 2021-05-11 ENCOUNTER — Emergency Department: Payer: Medicaid Other

## 2021-05-11 DIAGNOSIS — Z794 Long term (current) use of insulin: Secondary | ICD-10-CM | POA: Insufficient documentation

## 2021-05-11 DIAGNOSIS — F1320 Sedative, hypnotic or anxiolytic dependence, uncomplicated: Secondary | ICD-10-CM | POA: Diagnosis not present

## 2021-05-11 DIAGNOSIS — Z20822 Contact with and (suspected) exposure to covid-19: Secondary | ICD-10-CM | POA: Diagnosis not present

## 2021-05-11 DIAGNOSIS — E1065 Type 1 diabetes mellitus with hyperglycemia: Secondary | ICD-10-CM | POA: Diagnosis not present

## 2021-05-11 DIAGNOSIS — F17210 Nicotine dependence, cigarettes, uncomplicated: Secondary | ICD-10-CM | POA: Diagnosis not present

## 2021-05-11 DIAGNOSIS — J4520 Mild intermittent asthma, uncomplicated: Secondary | ICD-10-CM | POA: Insufficient documentation

## 2021-05-11 DIAGNOSIS — Z79899 Other long term (current) drug therapy: Secondary | ICD-10-CM | POA: Diagnosis not present

## 2021-05-11 DIAGNOSIS — E1040 Type 1 diabetes mellitus with diabetic neuropathy, unspecified: Secondary | ICD-10-CM | POA: Insufficient documentation

## 2021-05-11 DIAGNOSIS — F419 Anxiety disorder, unspecified: Secondary | ICD-10-CM | POA: Diagnosis not present

## 2021-05-11 DIAGNOSIS — E871 Hypo-osmolality and hyponatremia: Secondary | ICD-10-CM

## 2021-05-11 DIAGNOSIS — Z7951 Long term (current) use of inhaled steroids: Secondary | ICD-10-CM | POA: Diagnosis not present

## 2021-05-11 DIAGNOSIS — I10 Essential (primary) hypertension: Secondary | ICD-10-CM | POA: Diagnosis not present

## 2021-05-11 LAB — BASIC METABOLIC PANEL
Anion gap: 13 (ref 5–15)
Anion gap: 8 (ref 5–15)
BUN: 7 mg/dL (ref 6–20)
BUN: 6 mg/dL (ref 6–20)
CO2: 22 mmol/L (ref 22–32)
CO2: 22 mmol/L (ref 22–32)
Calcium: 9.4 mg/dL (ref 8.9–10.3)
Calcium: 8.9 mg/dL (ref 8.9–10.3)
Chloride: 89 mmol/L — ABNORMAL LOW (ref 98–111)
Chloride: 100 mmol/L (ref 98–111)
Creatinine, Ser: 0.71 mg/dL (ref 0.44–1.00)
Creatinine, Ser: 0.54 mg/dL (ref 0.44–1.00)
GFR, Estimated: >60 mL/min (ref >60)
GFR, Estimated: >60 mL/min (ref >60)
Glucose, Bld: 245 mg/dL — ABNORMAL HIGH (ref 70–99)
Glucose, Bld: 208 mg/dL — ABNORMAL HIGH (ref 70–99)
Potassium: 3.4 mmol/L — ABNORMAL LOW (ref 3.5–5.1)
Potassium: 3.7 mmol/L (ref 3.5–5.1)
Sodium: 124 mmol/L — ABNORMAL LOW (ref 135–145)
Sodium: 130 mmol/L — ABNORMAL LOW (ref 135–145)

## 2021-05-11 LAB — CBC
HCT: 42.6 % (ref 36.0–46.0)
Hemoglobin: 15.1 g/dL — ABNORMAL HIGH (ref 12.0–15.0)
MCH: 31.5 pg (ref 26.0–34.0)
MCHC: 35.4 g/dL (ref 30.0–36.0)
MCV: 88.8 fL (ref 80.0–100.0)
Platelets: 245 10*3/uL (ref 150–400)
RBC: 4.8 MIL/uL (ref 3.87–5.11)
RDW: 12.7 % (ref 11.5–15.5)
WBC: 11.2 10*3/uL — ABNORMAL HIGH (ref 4.0–10.5)
nRBC: 0 % (ref 0.0–0.2)

## 2021-05-11 LAB — TROPONIN I (HIGH SENSITIVITY)
Troponin I (High Sensitivity): 3 ng/L (ref <18)
Troponin I (High Sensitivity): 3 ng/L (ref <18)

## 2021-05-11 LAB — RESP PANEL BY RT-PCR (FLU A&B, COVID) ARPGX2
Influenza A by PCR: NEGATIVE
Influenza B by PCR: NEGATIVE
SARS Coronavirus 2 by RT PCR: NEGATIVE

## 2021-05-11 MED ORDER — LORAZEPAM 2 MG/ML IJ SOLN: 0.0000 mg | Freq: Two times a day (BID) | INTRAMUSCULAR | Status: DC

## 2021-05-11 MED ORDER — LORAZEPAM 2 MG/ML IJ SOLN: 0.0000 mg | Freq: Four times a day (QID) | INTRAMUSCULAR | Status: DC

## 2021-05-11 MED ORDER — CHLORDIAZEPOXIDE HCL 25 MG PO CAPS
25.0000 mg | ORAL_CAPSULE | Freq: Once | ORAL | Status: AC
  Administered 2021-05-11: 25 mg via ORAL
  Filled 2021-05-11: qty 1

## 2021-05-11 MED ORDER — SODIUM CHLORIDE 0.9 % IV BOLUS
1000.0000 mL | Freq: Once | INTRAVENOUS | Status: AC
  Administered 2021-05-11: 1000 mL via INTRAVENOUS

## 2021-05-11 MED ORDER — INSULIN ASPART 100 UNIT/ML IJ SOLN
5.0000 [IU] | Freq: Once | INTRAMUSCULAR | Status: DC
  Filled 2021-05-11: qty 1

## 2021-05-11 MED ORDER — LORAZEPAM 2 MG PO TABS: 0.0000 mg | ORAL_TABLET | Freq: Two times a day (BID) | ORAL | Status: DC

## 2021-05-11 MED ORDER — LORAZEPAM 2 MG PO TABS
0.0000 mg | ORAL_TABLET | Freq: Four times a day (QID) | ORAL | Status: DC
  Administered 2021-05-11: 1 mg via ORAL
  Filled 2021-05-11: qty 1

## 2021-05-11 MED ORDER — CHLORDIAZEPOXIDE HCL 25 MG PO CAPS: 25.0000 mg | ORAL_CAPSULE | Freq: Three times a day (TID) | ORAL | 0 refills | Status: DC | PRN

## 2021-05-11 NOTE — ED Provider Notes (Signed)
Emergency Medicine Provider Triage Evaluation Note  [name] , a 45 y.o. female  was evaluated in triage.  Pt complains of anxiety and central chest pain.  Patient reports off-and-on anxiety after tapering down from her anxiety medication.  She was seen here Tuesday and was given 3 to 4 days of pills, for which she has been cutting the dose in half.  She been unable to secure an appointment with her PCP for refill.  She denies any associated shortness of breath or cough with her chest pain.  Review of Systems  Positive: Anxiety, CP Negative: SOB, NV  Physical Exam  BP (!) 193/108 (BP Location: Right Arm)   Pulse (!) 128   Temp 98 F (36.7 C) (Oral)   Resp 16   Ht 5\' 4"  (1.626 m)   Wt 51 kg   SpO2 95%   BMI 19.30 kg/m  Gen:   Awake, no distress  anxious Resp:  Normal effort CTA MSK:   Moves extremities without difficulty  Other:   CVS: tachy rate  Medical Decision Making  Medically screening exam initiated at 3:04 PM.  Appropriate orders placed.  [name] was informed that the remainder of the evaluation will be completed by another provider, this initial triage assessment does not replace that evaluation, and the importance of remaining in the ED until their evaluation is complete.  Patient ED evaluation of central chest pain and anxiety, noted that she has not been able to get her routine prescription refill.   Melvenia Needles, PA-C 05/11/21 1505    Vanessa [place], MD 05/12/21 1730

## 2021-05-11 NOTE — ED Notes (Signed)
Pt report feeling better and would like to go home. MD aware

## 2021-05-11 NOTE — ED Triage Notes (Signed)
Pt comes pov with chest pain r/t anxiety and being off her anxiety medication. Stopped taking her xanax on Sunday. Was seen here Tuesday night and was given 3-4 days worth. Has not been able to get back in with her PCP to get more.

## 2021-05-11 NOTE — ED Provider Notes (Signed)
[facility] Emergency Department Provider Note    Event Date/Time   First MD Initiated Contact with Patient 05/11/21 2059     (approximate)  I have reviewed the triage vital signs and the nursing notes.   HISTORY  Chief Complaint Chest Pain and Anxiety    HPI [name] is a 45 y.o. female below listed past medical history presents to the ER for evaluation jitteriness anxiety palpitations nausea is going for the past several days.  Patient states that she ran out of her Xanax because her most recent prescription got thrown away.  Was here for same symptoms just a few days ago and got short course of Xanax to hold her over but she is not able to follow-up with her PCP.  States that she being getting 120 Xanax monthly for over 2 years for her anxiety.  Past Medical History:  Diagnosis Date   Anxiety state, unspecified 03/26/2009   ASTHMA 02/02/2007   Asthma    DEPRESSION 02/16/2009   DIABETES MELLITUS, TYPE I 02/02/2007   Endometriosis    GERD 12/21/2008   Herpes    at 18   Hyperlipidemia    Lesion of vocal cord    Osteoarthritis    Shortness of breath 09/25/2007   Vocal fold leukoplakia    Family History  Problem Relation Age of Onset   Diabetes Mother    Diabetes Maternal Aunt    Emphysema Maternal Uncle    Diabetes Maternal Grandfather    Lung cancer Maternal Aunt    Colon cancer Neg Hx    Kidney disease Neg Hx    Liver disease Neg Hx    Esophageal cancer Neg Hx    Rectal cancer Neg Hx    Stomach cancer Neg Hx    Past Surgical History:  Procedure Laterality Date   ABDOMINAL HYSTERECTOMY  2009   BSO   DIRECT LARYNGOSCOPY N/A 08/17/2017   Procedure: DIRECT LARYNGOSCOPY WITH OPERATING TELESCOPE WITH BIOPSY;  Surgeon: Helayne Seminole, MD;  Location: [facility];  Service: ENT;  Laterality: N/A;   NASOPHARYNGEAL BIOPSY Right 08/17/2017   Procedure: NASOPHARYNGEAL BIOPSY RIGHT NASAL LESION;  Surgeon: Helayne Seminole, MD;  Location: [facility];   Service: ENT;  Laterality: Right;   Patient Active Problem List   Diagnosis Date Noted   Hypertension 03/03/2020   Primary osteoarthritis of both knees 11/30/2019   Primary osteoarthritis of both feet 11/30/2019   Primary osteoarthritis of both hands 11/30/2019   Severe nonproliferative diabetic retinopathy of right eye, without macular edema, associated with type 1 diabetes mellitus ([place]) 11/04/2019   Severe nonproliferative diabetic retinopathy of left eye, without macular edema, associated with type 1 diabetes mellitus ([place]) 11/04/2019   Retinal hemorrhage of right eye 11/04/2019   Chronic pain syndrome 11/13/2018   Dizziness 09/18/2018   Arthralgia 08/14/2018   Mild intermittent asthma without complication 62/95/2841   Snoring 08/12/2018   Breast lump 08/12/2018   Lung nodule 08/12/2018   Chest wall pain 08/02/2018   Eczema 08/02/2018   Diabetic peripheral neuropathy ([place]) 01/11/2018   Low back pain with left-sided sciatica 11/29/2017   Peripheral polyneuropathy 11/29/2017   Multinodular goiter 07/29/2015   Type 1 diabetes mellitus with neurological manifestations, uncontrolled 01/27/2014   Restless leg 11/07/2013   Cholesteatoma 09/11/2011   Allergy to environmental factors 09/11/2011   Anxiety 05/01/2011   Acid reflux 04/01/2010   Absence of bladder continence 04/01/2010   INSOMNIA 02/18/2010   GERD 12/21/2008  SMOKER 09/25/2007      Prior to Admission medications   Medication Sig Start Date End Date Taking? Authorizing Provider  chlordiazePOXIDE (LIBRIUM) 25 MG capsule Take 1 capsule (25 mg total) by mouth 3 (three) times daily as needed for anxiety. 25 mg 3 times a day x 3 days, 25 mg 2 times a day for 3 days, 25mg  once a day for 3 days 05/11/21  Yes Merlyn Lot, MD  Accu-Chek Softclix Lancets lancets 1 each by Other route 2 (two) times daily. E11.9 03/08/20   Renato Shin, MD  amLODipine (NORVASC) 5 MG tablet Take 1 tablet (5 mg total) by mouth daily. Please  make follow up appointment for future refills. 02/17/21 05/18/21  Elouise Munroe, MD  atorvastatin (LIPITOR) 10 MG tablet Take 1 tablet (10 mg total) by mouth at bedtime. 09/26/18   Luetta Nutting, DO  Cholecalciferol (VITAMIN D3) 50 MCG (2000 UT) capsule Take by mouth daily.     [provider]  Continuous Blood Gluc Sensor (DEXCOM G6 SENSOR) MISC 1 each by Does not apply route See admin instructions. Change sensor every 10 days; E11.9 09/09/20   Renato Shin, MD  Continuous Blood Gluc Transmit (DEXCOM G6 TRANSMITTER) MISC Use as directed. Change every 90 days 09/16/20   Renato Shin, MD  fluticasone (FLOVENT HFA) 110 MCG/ACT inhaler INHALE 2 PUFFS INTO THE LUNGS IN THE MORNING AND AT BEDTIME. 04/06/21   Chesley Mires, MD  fluticasone (FLOVENT HFA) 44 MCG/ACT inhaler Inhale 1 puff into the lungs 2 (two) times daily. 02/26/20   Martyn Ehrich, NP  gabapentin (NEURONTIN) 300 MG capsule Take 1 capsule (300 mg total) by mouth 3 (three) times daily. As needed 09/26/18   Luetta Nutting, DO  glucagon 1 MG injection Inject 1 mg into the muscle once as needed. 01/05/20   Renato Shin, MD  glucose blood (ACCU-CHEK GUIDE) test strip 1 each by Other route in the morning and at bedtime. E11.9 03/08/20   Renato Shin, MD  HUMALOG KWIKPEN 100 UNIT/ML KwikPen INJECT 0.1 MLS (10 UNITS TOTAL) INTO THE SKIN 4 (FOUR) TIMES DAILY. 03/22/20   Renato Shin, MD  Ibuprofen (ADVIL PO) Take 2-3 tablets by mouth every 6 (six) hours as needed.    [provider]  insulin aspart (NOVOLOG) 100 UNIT/ML injection INJECT 40 UNITS DAILY PER INSULIN PUMP (MAX DOSAGE) 09/16/20   Renato Shin, MD  levalbuterol [facility] HFA) 45 MCG/ACT inhaler Inhale 1-2 puffs into the lungs 3 (three) times daily. 02/18/20   Martyn Ehrich, NP  lisinopril (ZESTRIL) 10 MG tablet Take 1 tablet (10 mg total) by mouth daily. 02/09/21   Elouise Munroe, MD  meclizine (ANTIVERT) 25 MG tablet Take 1 tablet (25 mg total) by mouth 3 (three)  times daily as needed for dizziness. 09/18/18   Luetta Nutting, DO  Multiple Vitamin (MULTIVITAMIN WITH MINERALS) TABS tablet Take 1 tablet by mouth daily.    [provider]  NOVOLOG 100 UNIT/ML injection INJECT 40 UNITS DAILY PER INSULIN PUMP (MAX DOSAGE) 01/26/21   Renato Shin, MD  omeprazole (PRILOSEC) 40 MG capsule 1 CAP IN THE MORNING AND AT BEDTIME. PLEASE SCHEDULE A YEARLY FOLLOW UP FOR FURTHER REFILLS. 12/30/20   Armbruster, Carlota Raspberry, MD  ondansetron (ZOFRAN ODT) 4 MG disintegrating tablet Take 1 tablet (4 mg total) by mouth every 8 (eight) hours as needed for nausea or vomiting. 05/04/21   Naaman Plummer, MD  oxyCODONE-acetaminophen (PERCOCET) 10-325 MG tablet SMARTSIG:1 Pill By Mouth 1 to  4 Times Daily PRN 07/15/19   [provider]  PARoxetine (PAXIL) 20 MG tablet Take 10 mg by mouth daily. Take 1/2 tablet (10mg  total) by mouth once daily. 10/07/19   [provider]  rOPINIRole (REQUIP) 1 MG tablet TAKE 1 TABLET BY MOUTH AT BEDTIME (PLZ SCHED APPT WITH NEW PROVIDER)  Please make appointment with Dr. Rex Kras.                 Needs to make appointment with Dr. Rex Kras. 01/26/20   [name], MD  sulfamethoxazole-trimethoprim (BACTRIM DS) 800-160 MG tablet Take 1 tablet by mouth 2 (two) times daily.    [provider]  valACYclovir (VALTREX) 500 MG tablet Take 1 tablet (500 mg total) by mouth 2 (two) times daily. Patient taking differently: Take 500 mg by mouth as needed. 12/11/19   Princess Bruins, MD  zinc gluconate 50 MG tablet Take 50 mg by mouth daily.     [provider]    Allergies Patient has no known allergies.    Social History Social History   Tobacco Use   Smoking status: Every Day    Packs/day: 1.50    Years: 25.00    Pack years: 37.50    Types: Cigarettes   Smokeless tobacco: Never  Vaping Use   Vaping Use: Former   Quit date: 02/23/2017   Devices: only used tobacco   Substance Use Topics    Alcohol use: Not Currently    Alcohol/week: 0.0 standard drinks   Drug use: No    Review of Systems Patient denies headaches, rhinorrhea, blurry vision, numbness, shortness of breath, chest pain, edema, cough, abdominal pain, nausea, vomiting, diarrhea, dysuria, fevers, rashes or hallucinations unless otherwise stated above in HPI. ____________________________________________   PHYSICAL EXAM:  VITAL SIGNS: Vitals:   05/11/21 2250 05/11/21 2313  BP: 140/82 136/83  Pulse: 100 (!) 108  Resp: 14 18  Temp:    SpO2: 98% 95%    Constitutional: Alert and oriented.  Eyes: Conjunctivae are normal.  Head: Atraumatic. Nose: No congestion/rhinnorhea. Mouth/Throat: Mucous membranes are moist.   Neck: No stridor. Painless ROM.  Cardiovascular: Normal rate, regular rhythm. Grossly normal heart sounds.  Good peripheral circulation. Respiratory: Normal respiratory effort.  No retractions. Lungs CTAB. Gastrointestinal: Soft and nontender. No distention. No abdominal bruits. No CVA tenderness. Genitourinary:  Musculoskeletal: No lower extremity tenderness nor edema.  No joint effusions. Neurologic:  Normal speech and language. Trace tongue fasciculations, No gross focal neurologic deficits are appreciated. No facial droop Skin:  Skin is warm, dry and intact. No rash noted. Psychiatric: Mood and affect are anxious. Speech and behavior are normal.  ____________________________________________   LABS (all labs ordered are listed, but only abnormal results are displayed)  Results for orders placed or performed during the hospital encounter of 05/11/21 (from the past 24 hour(s))  Basic metabolic panel     Status: Abnormal   Collection Time: 05/11/21  2:58 PM  Result Value Ref Range   Sodium 124 (L) 135 - 145 mmol/L   Potassium 3.4 (L) 3.5 - 5.1 mmol/L   Chloride 89 (L) 98 - 111 mmol/L   CO2 22 22 - 32 mmol/L   Glucose, Bld 245 (H) 70 - 99 mg/dL   BUN 7 6 - 20 mg/dL   Creatinine, Ser  0.71 0.44 - 1.00 mg/dL   Calcium 9.4 8.9 - 10.3 mg/dL   GFR, Estimated >60 >60 mL/min   Anion gap 13 5 - 15  CBC  Status: Abnormal   Collection Time: 05/11/21  2:58 PM  Result Value Ref Range   WBC 11.2 (H) 4.0 - 10.5 K/uL   RBC 4.80 3.87 - 5.11 MIL/uL   Hemoglobin 15.1 (H) 12.0 - 15.0 g/dL   HCT 42.6 36.0 - 46.0 %   MCV 88.8 80.0 - 100.0 fL   MCH 31.5 26.0 - 34.0 pg   MCHC 35.4 30.0 - 36.0 g/dL   RDW 12.7 11.5 - 15.5 %   Platelets 245 150 - 400 K/uL   nRBC 0.0 0.0 - 0.2 %  Troponin I (High Sensitivity)     Status: None   Collection Time: 05/11/21  2:58 PM  Result Value Ref Range   Troponin I (High Sensitivity) 3 <18 ng/L  Troponin I (High Sensitivity)     Status: None   Collection Time: 05/11/21  6:11 PM  Result Value Ref Range   Troponin I (High Sensitivity) 3 <18 ng/L  Resp Panel by RT-PCR (Flu A&B, Covid) Nasopharyngeal Swab     Status: None   Collection Time: 05/11/21  8:58 PM   Specimen: Nasopharyngeal Swab; Nasopharyngeal(NP) swabs in vial transport medium  Result Value Ref Range   SARS Coronavirus 2 by RT PCR NEGATIVE NEGATIVE   Influenza A by PCR NEGATIVE NEGATIVE   Influenza B by PCR NEGATIVE NEGATIVE  Basic metabolic panel     Status: Abnormal   Collection Time: 05/11/21 10:26 PM  Result Value Ref Range   Sodium 130 (L) 135 - 145 mmol/L   Potassium 3.7 3.5 - 5.1 mmol/L   Chloride 100 98 - 111 mmol/L   CO2 22 22 - 32 mmol/L   Glucose, Bld 208 (H) 70 - 99 mg/dL   BUN 6 6 - 20 mg/dL   Creatinine, Ser 0.54 0.44 - 1.00 mg/dL   Calcium 8.9 8.9 - 10.3 mg/dL   GFR, Estimated >60 >60 mL/min   Anion gap 8 5 - 15   ____________________________________________  EKG My review and personal interpretation at Time: 15:01   Indication: withdrawl  Rate: 125  Rhythm: sinus Axis: righ Other: no stemi, nonspecific st abn ____________________________________________  RADIOLOGY  I personally reviewed all radiographic images ordered to evaluate for the above acute  complaints and reviewed radiology reports and findings.  These findings were personally discussed with the patient.  Please see medical record for radiology report.  ____________________________________________   PROCEDURES  Procedure(s) performed:  Procedures    Critical Care performed: no ____________________________________________   INITIAL IMPRESSION / ASSESSMENT AND PLAN / ED COURSE  Pertinent labs & imaging results that were available during my care of the patient were reviewed by me and considered in my medical decision making (see chart for details).   DDX: Withdrawal, electrolyte abnormality, dehydration, dysrhythmia, ACS, sepsis, pneumonia, pneumothorax, anxiety, panic attack  [name] is a 45 y.o. who presents to the ED with very longstanding extensive benzo dependence presents to the ER for symptoms that have developed after running out of her benzos.  EKG is nonischemic.  Blood work does show evidence of hyponatremia mild hyperglycemia but no sign of DKA.  Patient will be given IV fluids for her hyponatremia.  She is placed on CIWA's criteria with only mildly elevated score.  Will give p.o. Ativan as well as Librium and observe.  She is denying any SI or HI.  She is not sure that she would want inpatient detox but will think about it while we are observing her.  Will encourage p.o.  intake.  Her exam is otherwise reassuring.  Clinical Course as of 05/11/21 2317  Wed May 11, 2021  2216 Patient feels much better after Librium and Ativan as well as IV fluids.  Heart rate improving.  Does not appear to be in acute withdrawal at this time.  Given the extent and duration of her dependence on benzos discussing with her options I do not believe this is a good long-term option for her anxiety management.  States that she has not seen a psychiatrist but is talking to a therapist regarding her anxiety and this is new.  States that she was told by the therapist to come to the ER  due to concern for withdrawal.  States she is otherwise feeling much better.  States that she does not want to be on those medications if possible would like help with weaning off of them. [PR]  2252 Repeat BMP significantly improved. [PR]  2311 Patient states that she feels significantly improved is requesting discharge.  Does not want to be evaluated by TTS for detox.  She is interested in Librium taper as she does not want to be on Xanax and states that she is now recognizing that she has developed a significant dependence on these medications.  She is not reporting any SI or HI.  She does appear stable and appropriate for outpatient follow-up. [PR]    Clinical Course User Index [PR] Merlyn Lot, MD    The patient was evaluated in Emergency Department today for the symptoms described in the history of present illness. He/she was evaluated in the context of the global COVID-19 pandemic, which necessitated consideration that the patient might be at risk for infection with the SARS-CoV-2 virus that causes COVID-19. Institutional protocols and algorithms that pertain to the evaluation of patients at risk for COVID-19 are in a state of rapid change based on information released by regulatory bodies including the CDC and federal and state organizations. These policies and algorithms were followed during the patient's care in the ED.  As part of my medical decision making, I reviewed the following data within the [place] notes reviewed and incorporated, Labs reviewed, notes from prior ED visits and [place] Controlled Substance Database   ____________________________________________   FINAL CLINICAL IMPRESSION(S) / ED DIAGNOSES  Final diagnoses:  Anxiety  Benzodiazepine dependence ([place])  Hyponatremia      NEW MEDICATIONS STARTED DURING THIS VISIT:  New Prescriptions   CHLORDIAZEPOXIDE (LIBRIUM) 25 MG CAPSULE    Take 1 capsule (25 mg total) by mouth 3 (three) times  daily as needed for anxiety. 25 mg 3 times a day x 3 days, 25 mg 2 times a day for 3 days, 25mg  once a day for 3 days     Note:  This document was prepared using Dragon voice recognition software and may include unintentional dictation errors.    Merlyn Lot, MD 05/11/21 2328

## 2021-05-12 ENCOUNTER — Other Ambulatory Visit: Payer: Self-pay | Admitting: Endocrinology

## 2021-05-12 ENCOUNTER — Other Ambulatory Visit: Payer: Self-pay | Admitting: Gastroenterology

## 2021-05-14 ENCOUNTER — Other Ambulatory Visit: Payer: Self-pay | Admitting: Gastroenterology

## 2021-05-14 ENCOUNTER — Other Ambulatory Visit: Payer: Self-pay | Admitting: Endocrinology

## 2021-05-15 ENCOUNTER — Other Ambulatory Visit: Payer: Self-pay | Admitting: Internal Medicine

## 2021-05-16 ENCOUNTER — Telehealth: Payer: Self-pay | Admitting: Internal Medicine

## 2021-05-16 ENCOUNTER — Other Ambulatory Visit: Payer: Self-pay | Admitting: Gastroenterology

## 2021-05-16 NOTE — Telephone Encounter (Signed)
Spoke with pt regarding medication clarification. Pt is unsure of what dosage she is supposed to be taking of amlodipine and lisinopril. Per pt's chart it looks like back in August there was some confusion with her dosage on amlodipine. Per documentation pt is supposed to be taking 5mg . Pt admits that she is not taking her lisinopril at this time. Pt is also not taking her blood pressure. Pt states that she is going through a hard time and is not taking care of herself because her mom is chronically sick. Pt states that she is the only one that her mom has. Pt would like to know if she should resume taking lisinopril. Advised pt that she should start taking her blood pressure daily and keep a log then she should be seen in the next couple of weeks to review blood pressure log to determine if lisinopril needs to be restarted. Pt is in agreement, however she is hesitant to make an appointment at this time because she is unsure if she can find care for her mom while she comes to her appointment. Amlodipine 5mg  refilled. Pt states she will call back for an appointment. Will route to MD as FYI.

## 2021-05-16 NOTE — Telephone Encounter (Signed)
Pt c/o medication issue:  1. Name of Medication: amLODipine (NORVASC) 5 MG tablet  lisinopril (ZESTRIL) 10 MG tablet 2. How are you currently taking this medication (dosage and times per day)? Take 1 tablet (5 mg total) by mouth daily. Please make follow up appointment for future refills.  Take 1 tablet (10 mg total) by mouth daily.  3. Are you having a reaction (difficulty breathing--STAT)? no  4. What is your medication issue? Patient feels that med mg has been switch on the medication. Please advise

## 2021-06-03 ENCOUNTER — Ambulatory Visit (INDEPENDENT_AMBULATORY_CARE_PROVIDER_SITE_OTHER): Payer: Medicaid Other | Admitting: Endocrinology

## 2021-06-03 ENCOUNTER — Encounter: Payer: Self-pay | Admitting: Endocrinology

## 2021-06-03 ENCOUNTER — Other Ambulatory Visit: Payer: Self-pay

## 2021-06-03 VITALS — BP 126/76 | HR 96 | Ht 64.0 in | Wt 115.8 lb

## 2021-06-03 DIAGNOSIS — E1142 Type 2 diabetes mellitus with diabetic polyneuropathy: Secondary | ICD-10-CM

## 2021-06-03 LAB — POCT GLYCOSYLATED HEMOGLOBIN (HGB A1C): Hemoglobin A1C: 8.5 % — AB (ref 4.0–5.6)

## 2021-06-03 MED ORDER — DEXCOM G6 SENSOR MISC: 1.0000 | 3 refills | Status: DC

## 2021-06-03 MED ORDER — INSULIN ASPART 100 UNIT/ML IJ SOLN: INTRAMUSCULAR | 3 refills | Status: DC

## 2021-06-03 NOTE — Progress Notes (Signed)
Subjective:    Patient ID: [name], female    DOB: 09-May-1976, 45 y.o.   MRN: [id number]  HPI Pt returns for f/u of diabetes mellitus: DM type: 1 Dx'ed: 7408 Complications: PN and DR.  Therapy: insulin since dx, pump rx since 2017.  DKA: never Severe hypoglycemia: never Pancreatitis: never SDOH: therapy has been limited by chronic noncompliance.   Other:  she has had TAH; she eats at approx 10AM and 7PM.   Interval history: she takes these pump settings: basal rate of 0.2 units/hr.  bolus of 0.5-5 units/meal, but try to increase this to the higher part of this range, for a total of approx 8 units per day.      correction bolus (which some people call "sensitivity," or "insulin sensitivity ratio," or just "isr") of 1 unit for each 200 by which your glucose exceeds 150.   TDD is 12 units (47% basal, 37% meal bolus).  Control IQ in on 94% of the day.  I reviewed continuous glucose monitor data.  Glucose varies from 121-372.  It is in general highest at 9PM.  It is lowest at 10AM.  It does not change overnight.   Past Medical History:  Diagnosis Date   Anxiety state, unspecified 03/26/2009   ASTHMA 02/02/2007   Asthma    DEPRESSION 02/16/2009   DIABETES MELLITUS, TYPE I 02/02/2007   Endometriosis    GERD 12/21/2008   Herpes    at 18   Hyperlipidemia    Lesion of vocal cord    Osteoarthritis    Shortness of breath 09/25/2007   Vocal fold leukoplakia     Past Surgical History:  Procedure Laterality Date   ABDOMINAL HYSTERECTOMY  2009   BSO   DIRECT LARYNGOSCOPY N/A 08/17/2017   Procedure: DIRECT LARYNGOSCOPY WITH OPERATING TELESCOPE WITH BIOPSY;  Surgeon: Helayne Seminole, MD;  Location: [facility];  Service: ENT;  Laterality: N/A;   NASOPHARYNGEAL BIOPSY Right 08/17/2017   Procedure: NASOPHARYNGEAL BIOPSY RIGHT NASAL LESION;  Surgeon: Helayne Seminole, MD;  Location: [facility];  Service: ENT;  Laterality: Right;    Social History   Socioeconomic History   Marital status:  Legally Separated    Spouse name: Not on file   Number of children: 2   Years of education: Not on file   Highest education level: Not on file  Occupational History    Employer: UNEMPLOYED  Tobacco Use   Smoking status: Every Day    Packs/day: 1.50    Years: 25.00    Pack years: 37.50    Types: Cigarettes   Smokeless tobacco: Never  Vaping Use   Vaping Use: Former   Quit date: 02/23/2017   Devices: only used tobacco   Substance and Sexual Activity   Alcohol use: Not Currently    Alcohol/week: 0.0 standard drinks   Drug use: No   Sexual activity: Yes    Partners: Male    Birth control/protection: None    Comment: 1st intercourse-14, partners- 77, current partner- 4 months  Other Topics Concern   Not on file  Social History Narrative   2 children--pt has custody. Husband pays child support.   Daily Caffeine Use-3 2 liters a day   Pt does not get regular exercise.   Social Determinants of Health   Financial Resource Strain: Not on file  Food Insecurity: Not on file  Transportation Needs: Not on file  Physical Activity: Not on file  Stress: Not on file  Social Connections:  Not on file  Intimate Partner Violence: Not on file    Current Outpatient Medications on File Prior to Visit  Medication Sig Dispense Refill   ACCU-CHEK GUIDE test strip TEST ONCE IN THE MORNING AND ONCE AT BEDTIME. E11.9 100 strip 7   Accu-Chek Softclix Lancets lancets 1 each by Other route 2 (two) times daily. E11.9 180 each 2   amLODipine (NORVASC) 5 MG tablet TAKE 1 TABLET (5 MG TOTAL) BY MOUTH DAILY. PLEASE MAKE FOLLOW UP APPOINTMENT FOR FUTURE REFILLS. 90 tablet 0   atorvastatin (LIPITOR) 10 MG tablet Take 1 tablet (10 mg total) by mouth at bedtime. 90 tablet 1   chlordiazePOXIDE (LIBRIUM) 25 MG capsule Take 1 capsule (25 mg total) by mouth 3 (three) times daily as needed for anxiety. 25 mg 3 times a day x 3 days, 25 mg 2 times a day for 3 days, 25mg  once a day for 3 days 18 capsule 0    Cholecalciferol (VITAMIN D3) 50 MCG (2000 UT) capsule Take by mouth daily.      Continuous Blood Gluc Transmit (DEXCOM G6 TRANSMITTER) MISC Use as directed. Change every 90 days 1 each 3   fluticasone (FLOVENT HFA) 110 MCG/ACT inhaler INHALE 2 PUFFS INTO THE LUNGS IN THE MORNING AND AT BEDTIME. 12 each 1   fluticasone (FLOVENT HFA) 44 MCG/ACT inhaler Inhale 1 puff into the lungs 2 (two) times daily. 1 each 5   gabapentin (NEURONTIN) 300 MG capsule Take 1 capsule (300 mg total) by mouth 3 (three) times daily. As needed 270 capsule 1   glucagon 1 MG injection Inject 1 mg into the muscle once as needed. 1 each 12   Ibuprofen (ADVIL PO) Take 2-3 tablets by mouth every 6 (six) hours as needed.     lisinopril (ZESTRIL) 10 MG tablet Take 1 tablet (10 mg total) by mouth daily. 90 tablet 0   meclizine (ANTIVERT) 25 MG tablet Take 1 tablet (25 mg total) by mouth 3 (three) times daily as needed for dizziness. 30 tablet 0   Multiple Vitamin (MULTIVITAMIN WITH MINERALS) TABS tablet Take 1 tablet by mouth daily.     NOVOLOG 100 UNIT/ML injection INJECT 40 UNITS DAILY PER INSULIN PUMP (MAX DOSAGE) 10 mL 3   omeprazole (PRILOSEC) 40 MG capsule 1 CAP IN THE MORNING AND AT BEDTIME. PLEASE SCHEDULE A YEARLY FOLLOW UP FOR FURTHER REFILLS. 60 capsule 1   ondansetron (ZOFRAN ODT) 4 MG disintegrating tablet Take 1 tablet (4 mg total) by mouth every 8 (eight) hours as needed for nausea or vomiting. 20 tablet 0   oxyCODONE-acetaminophen (PERCOCET) 10-325 MG tablet SMARTSIG:1 Pill By Mouth 1 to 4 Times Daily PRN     rOPINIRole (REQUIP) 1 MG tablet TAKE 1 TABLET BY MOUTH AT BEDTIME (PLZ SCHED APPT WITH NEW PROVIDER)  Please make appointment with Dr. Rex Kras.                 Needs to make appointment with Dr. Rex Kras. 90 tablet 0   valACYclovir (VALTREX) 500 MG tablet Take 1 tablet (500 mg total) by mouth 2 (two) times daily. (Patient taking differently: Take 500 mg by mouth as needed.) 10 tablet 3   zinc  gluconate 50 MG tablet Take 50 mg by mouth daily.      levalbuterol (XOPENEX HFA) 45 MCG/ACT inhaler Inhale 1-2 puffs into the lungs 3 (three) times daily. 1 Inhaler 3   PARoxetine (PAXIL) 20 MG tablet Take 10 mg by mouth daily. Take 1/2 tablet (10mg   total) by mouth once daily.     sulfamethoxazole-trimethoprim (BACTRIM DS) 800-160 MG tablet Take 1 tablet by mouth 2 (two) times daily.     No current facility-administered medications on file prior to visit.    No Known Allergies  Family History  Problem Relation Age of Onset   Diabetes Mother    Diabetes Maternal Aunt    Emphysema Maternal Uncle    Diabetes Maternal Grandfather    Lung cancer Maternal Aunt    Colon cancer Neg Hx    Kidney disease Neg Hx    Liver disease Neg Hx    Esophageal cancer Neg Hx    Rectal cancer Neg Hx    Stomach cancer Neg Hx     BP 126/76 (BP Location: Left Arm, Patient Position: Sitting, Cuff Size: Normal)   Pulse 96   Ht 5\' 4"  (1.626 m)   Wt 115 lb 12.8 oz (52.5 kg)   SpO2 93%   BMI 19.88 kg/m     Review of Systems She denies hypoglycemia    Objective:   Physical Exam   Lab Results  Component Value Date   HGBA1C 8.5 (A) 06/03/2021      Assessment & Plan:  Type 1 DM: uncontrolled.  We discussed the fact that she is not taking enough bolus insulin.    Patient Instructions  Please take these pump settings:  basal rate of 0.2 units/hr.   bolus of 2-5 units/meal, but try to increase this to the higher part of this range, for a total of approx 8 units per day.      correction bolus (which some people call "sensitivity," or "insulin sensitivity ratio," or just "isr") of 1 unit for each 200 by which your glucose exceeds 150.  check your blood sugar 4 times a day: before the 3 meals, and at bedtime.  also check if you have symptoms of your blood sugar being too high or too low.  please keep a record of the readings and bring it to your next appointment here (or you can bring the meter  itself).  You can write it on any piece of paper.  please call us sooner if your blood sugar goes below 70, or if you have a lot of readings over 200.   Please come back for a follow-up appointment in 2 months.

## 2021-06-03 NOTE — Patient Instructions (Addendum)
Please take these pump settings:  basal rate of 0.2 units/hr.   bolus of 2-5 units/meal, but try to increase this to the higher part of this range, for a total of approx 8 units per day.      correction bolus (which some people call "sensitivity," or "insulin sensitivity ratio," or just "isr") of 1 unit for each 200 by which your glucose exceeds 150.  check your blood sugar 4 times a day: before the 3 meals, and at bedtime.  also check if you have symptoms of your blood sugar being too high or too low.  please keep a record of the readings and bring it to your next appointment here (or you can bring the meter itself).  You can write it on any piece of paper.  please call us sooner if your blood sugar goes below 70, or if you have a lot of readings over 200.   Please come back for a follow-up appointment in 2 months.

## 2021-06-20 ENCOUNTER — Other Ambulatory Visit (HOSPITAL_COMMUNITY): Payer: Self-pay

## 2021-06-20 ENCOUNTER — Telehealth: Payer: Self-pay | Admitting: Pharmacy Technician

## 2021-06-20 NOTE — Telephone Encounter (Signed)
Patient Advocate Encounter  Received notification from [facility]) that prior authorization for Irene is required.   PA submitted on 12.5.22 Confirmation# [id number] W Status is pending   [facility] will continue to follow  Luciano Cutter, CPhT Patient Advocate [facility] Phone: [phone] Fax:  [phone]

## 2021-07-12 ENCOUNTER — Telehealth: Payer: Self-pay | Admitting: Gastroenterology

## 2021-07-12 MED ORDER — OMEPRAZOLE 40 MG PO CPDR: 40.0000 mg | DELAYED_RELEASE_CAPSULE | Freq: Two times a day (BID) | ORAL | 1 refills | Status: DC

## 2021-07-12 NOTE — Telephone Encounter (Signed)
Patient last seen 09-2019.  Refill sent to get her to 2-6 appt

## 2021-07-12 NOTE — Telephone Encounter (Signed)
Patient needs a refill for her omeprazole called into [facility] in [place].  Patient has an appointment 09/11/21 at 1:40 p.m.  If there are any issues, please reach out to patient. Thank you.

## 2021-07-27 ENCOUNTER — Telehealth: Payer: Self-pay

## 2021-07-27 ENCOUNTER — Other Ambulatory Visit (HOSPITAL_COMMUNITY): Payer: Self-pay

## 2021-07-27 NOTE — Telephone Encounter (Signed)
Patient Advocate Encounter  Prior Authorization for [employer] has been approved.    PA# [id number]  Effective dates: 07/27/21 through 08/26/21  Per Test Claim Patients co-pay is $0   Spoke with Pharmacy to Process.  Patient Advocate Fax: [phone]

## 2021-07-27 NOTE — Telephone Encounter (Signed)
Patient Advocate Encounter  Prior Authorization for Dexcom G6 Receiver has been approved.    PA# [id number]  Effective dates: 07/27/21 through 01/23/22  Per Test Claim Patients co-pay is $0   Spoke with Pharmacy to Process.  Patient Advocate Fax: [phone]

## 2021-07-27 NOTE — Telephone Encounter (Signed)
Patient Advocate Encounter   Received notification from [place] that prior authorization for Dexcom G6 Sensors is required by his/her insurance [place] [employer].   PA submitted on 07/27/21  [place] Tracks #: [id number] W  Status is pending    [facility] will continue to follow:  Patient Advocate Fax: [phone]

## 2021-07-27 NOTE — Telephone Encounter (Signed)
Patient Advocate Encounter   Received notification from [facility] that prior authorization for Dexcom G6 Receiver is required by his/her insurance [place] [employer].   PA submitted on 07/27/21  [name] Tracks #: [id number] W  Status is pending    [facility] will continue to follow:  Patient Advocate Fax: [phone]

## 2021-07-27 NOTE — Telephone Encounter (Signed)
Patient Advocate Encounter  Prior Authorization for [employer] has been approved.    PA# [id number]  Effective dates: 07/27/21 through 07/27/22  Per Test Claim Patients co-pay is $0   Spoke with Pharmacy to Process.  Patient Advocate Fax: [phone]

## 2021-07-27 NOTE — Telephone Encounter (Signed)
Patient Advocate Encounter   Received notification from [place] that prior authorization for Dexcom G6 Transmitter is required by his/her insurance [place] [employer].   PA submitted on 07/27/21  [place] Tracks #: [id number] W  Status is pending    [facility] will continue to follow:  Patient Advocate Fax: [phone]

## 2021-07-29 ENCOUNTER — Other Ambulatory Visit: Payer: Self-pay | Admitting: Internal Medicine

## 2021-08-01 ENCOUNTER — Other Ambulatory Visit: Payer: Self-pay | Admitting: Endocrinology

## 2021-08-05 ENCOUNTER — Ambulatory Visit: Payer: Medicaid Other | Admitting: Endocrinology

## 2021-08-06 ENCOUNTER — Other Ambulatory Visit: Payer: Self-pay | Admitting: Internal Medicine

## 2021-08-06 ENCOUNTER — Other Ambulatory Visit: Payer: Self-pay | Admitting: Gastroenterology

## 2021-08-16 ENCOUNTER — Ambulatory Visit (INDEPENDENT_AMBULATORY_CARE_PROVIDER_SITE_OTHER): Payer: Medicaid Other | Admitting: Ophthalmology

## 2021-08-16 ENCOUNTER — Other Ambulatory Visit: Payer: Self-pay

## 2021-08-16 ENCOUNTER — Encounter (INDEPENDENT_AMBULATORY_CARE_PROVIDER_SITE_OTHER): Payer: Self-pay | Admitting: Ophthalmology

## 2021-08-16 DIAGNOSIS — E103492 Type 1 diabetes mellitus with severe nonproliferative diabetic retinopathy without macular edema, left eye: Secondary | ICD-10-CM

## 2021-08-16 DIAGNOSIS — H4311 Vitreous hemorrhage, right eye: Secondary | ICD-10-CM | POA: Insufficient documentation

## 2021-08-16 DIAGNOSIS — E103591 Type 1 diabetes mellitus with proliferative diabetic retinopathy without macular edema, right eye: Secondary | ICD-10-CM | POA: Diagnosis not present

## 2021-08-16 HISTORY — DX: Vitreous hemorrhage, right eye: H43.11

## 2021-08-16 NOTE — Assessment & Plan Note (Signed)
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  Will recommend peripheral anterior PRP for permanent detection in the retinal vasculature long-term.  I will explained the patient the vitreous hemorrhage will clear more slowly.

## 2021-08-16 NOTE — Assessment & Plan Note (Signed)
New onset today associated with proliferative diabetic retinopathy NVE temporally, high risk characteristics present

## 2021-08-16 NOTE — Addendum Note (Signed)
Addended by: Deloria Lair A on: 08/16/2021 02:00 PM   Modules accepted: Orders

## 2021-08-16 NOTE — Progress Notes (Addendum)
08/16/2021     CHIEF COMPLAINT Patient presents for  Chief Complaint  Patient presents with   Retina Evaluation      HISTORY OF PRESENT ILLNESS: [name] is a 46 y.o. female who presents to the clinic today for:   HPI     Retina Evaluation           Laterality: right eye   Onset: 1 day ago   Duration: 1 day   Associated Symptoms: Negative for Flashes, Floaters, Distortion, Blind Spot and Pain         Comments   WIP- black lines/ streaks OD since last night. Pt states last night she fell coming up 2 steps, tripped, about an hour and a half before the streaks started. Pt states vision is blurred, cannot see anything out of the right eye. LBS this morning: 156.      Last edited by Laurin Coder on 08/16/2021  1:05 PM.      Referring physician: Tamsen Roers, MD [place],  [place] [address]  HISTORICAL INFORMATION:   Selected notes from the MEDICAL RECORD NUMBER    Lab Results  Component Value Date   HGBA1C 8.5 (A) 06/03/2021     CURRENT MEDICATIONS: No current outpatient medications on file. (Ophthalmic Drugs)   No current facility-administered medications for this visit. (Ophthalmic Drugs)   Current Outpatient Medications (Other)  Medication Sig   ACCU-CHEK GUIDE test strip TEST ONCE IN THE MORNING AND ONCE AT BEDTIME. E11.9   Accu-Chek Softclix Lancets lancets 1 each by Other route 2 (two) times daily. E11.9   amLODipine (NORVASC) 5 MG tablet TAKE 1 TABLET (5 MG TOTAL) BY MOUTH DAILY. PLEASE MAKE FOLLOW UP APPOINTMENT FOR FUTURE REFILLS.   atorvastatin (LIPITOR) 10 MG tablet Take 1 tablet (10 mg total) by mouth at bedtime.   chlordiazePOXIDE (LIBRIUM) 25 MG capsule Take 1 capsule (25 mg total) by mouth 3 (three) times daily as needed for anxiety. 25 mg 3 times a day x 3 days, 25 mg 2 times a day for 3 days, 25mg  once a day for 3 days   Cholecalciferol (VITAMIN D3) 50 MCG (2000 UT) capsule Take by mouth daily.    Continuous Blood  Gluc Sensor (DEXCOM G6 SENSOR) MISC 1 each by Does not apply route See admin instructions. Change sensor every 10 days; E10.9   Continuous Blood Gluc Transmit (DEXCOM G6 TRANSMITTER) MISC Use as directed. Change every 90 days   fluticasone (FLOVENT HFA) 110 MCG/ACT inhaler INHALE 2 PUFFS INTO THE LUNGS IN THE MORNING AND AT BEDTIME.   fluticasone (FLOVENT HFA) 44 MCG/ACT inhaler Inhale 1 puff into the lungs 2 (two) times daily.   gabapentin (NEURONTIN) 300 MG capsule Take 1 capsule (300 mg total) by mouth 3 (three) times daily. As needed   glucagon 1 MG injection Inject 1 mg into the muscle once as needed.   Ibuprofen (ADVIL PO) Take 2-3 tablets by mouth every 6 (six) hours as needed.   insulin aspart (NOVOLOG) 100 UNIT/ML injection For use in pump, total of 90 units per day   levalbuterol (XOPENEX HFA) 45 MCG/ACT inhaler Inhale 1-2 puffs into the lungs 3 (three) times daily.   lisinopril (ZESTRIL) 10 MG tablet Take 1 tablet (10 mg total) by mouth daily.   meclizine (ANTIVERT) 25 MG tablet Take 1 tablet (25 mg total) by mouth 3 (three) times daily as needed for dizziness.   Multiple Vitamin (MULTIVITAMIN WITH MINERALS) TABS tablet  Take 1 tablet by mouth daily.   NOVOLOG 100 UNIT/ML injection INJECT 40 UNITS DAILY PER INSULIN PUMP (MAX DOSAGE)   omeprazole (PRILOSEC) 40 MG capsule TAKE 1 CAPSULE BY MOUTH 2 TIMES DAILY. PLEASE KEEP YOUR 08-22-21 APPOINTMENT FOR ANY FURTHER REFILLS   ondansetron (ZOFRAN ODT) 4 MG disintegrating tablet Take 1 tablet (4 mg total) by mouth every 8 (eight) hours as needed for nausea or vomiting.   oxyCODONE-acetaminophen (PERCOCET) 10-325 MG tablet SMARTSIG:1 Pill By Mouth 1 to 4 Times Daily PRN   PARoxetine (PAXIL) 20 MG tablet Take 10 mg by mouth daily. Take 1/2 tablet (10mg  total) by mouth once daily.   rOPINIRole (REQUIP) 1 MG tablet TAKE 1 TABLET BY MOUTH AT BEDTIME (PLZ SCHED APPT WITH NEW PROVIDER)  Please make appointment with Dr. Rex Kras.                  Needs to make appointment with Dr. Rex Kras.   sulfamethoxazole-trimethoprim (BACTRIM DS) 800-160 MG tablet Take 1 tablet by mouth 2 (two) times daily.   valACYclovir (VALTREX) 500 MG tablet Take 1 tablet (500 mg total) by mouth 2 (two) times daily. (Patient taking differently: Take 500 mg by mouth as needed.)   zinc gluconate 50 MG tablet Take 50 mg by mouth daily.    No current facility-administered medications for this visit. (Other)      REVIEW OF SYSTEMS:    ALLERGIES No Known Allergies  PAST MEDICAL HISTORY Past Medical History:  Diagnosis Date   Anxiety state, unspecified 03/26/2009   ASTHMA 02/02/2007   Asthma    DEPRESSION 02/16/2009   DIABETES MELLITUS, TYPE I 02/02/2007   Endometriosis    GERD 12/21/2008   Herpes    at 18   Hyperlipidemia    Lesion of vocal cord    Osteoarthritis    Shortness of breath 09/25/2007   Vocal fold leukoplakia    Past Surgical History:  Procedure Laterality Date   ABDOMINAL HYSTERECTOMY  2009   BSO   DIRECT LARYNGOSCOPY N/A 08/17/2017   Procedure: DIRECT LARYNGOSCOPY WITH OPERATING TELESCOPE WITH BIOPSY;  Surgeon: Helayne Seminole, MD;  Location: [facility];  Service: ENT;  Laterality: N/A;   NASOPHARYNGEAL BIOPSY Right 08/17/2017   Procedure: NASOPHARYNGEAL BIOPSY RIGHT NASAL LESION;  Surgeon: Helayne Seminole, MD;  Location: [facility];  Service: ENT;  Laterality: Right;    FAMILY HISTORY Family History  Problem Relation Age of Onset   Diabetes Mother    Diabetes Maternal Aunt    Emphysema Maternal Uncle    Diabetes Maternal Grandfather    Lung cancer Maternal Aunt    Colon cancer Neg Hx    Kidney disease Neg Hx    Liver disease Neg Hx    Esophageal cancer Neg Hx    Rectal cancer Neg Hx    Stomach cancer Neg Hx     SOCIAL HISTORY Social History   Tobacco Use   Smoking status: Every Day    Packs/day: 1.50    Years: 25.00    Pack years: 37.50    Types: Cigarettes   Smokeless tobacco: Never   Vaping Use   Vaping Use: Former   Quit date: 02/23/2017   Devices: only used tobacco   Substance Use Topics   Alcohol use: Not Currently    Alcohol/week: 0.0 standard drinks   Drug use: No         OPHTHALMIC EXAM:  Base Eye Exam     Visual Acuity (ETDRS)  Right Left   Dist cc 20/30 -2 20/20 -1    Correction: Contacts         Tonometry (Tonopen, 1:07 PM)       Right Left   Pressure 16 14         Pupils       Pupils Dark Light Shape React APD   Right PERRL 4 3 Round Brisk None   Left PERRL 4 3 Round Brisk None         Visual Fields (Counting fingers)       Left Right    Full Full         Extraocular Movement       Right Left    Full Full         Neuro/Psych     Oriented x3: Yes   Mood/Affect: Normal         Dilation     Both eyes: 1.0% Mydriacyl, 2.5% Phenylephrine @ 1:07 PM           Slit Lamp and Fundus Exam     External Exam       Right Left   External Normal Normal         Slit Lamp Exam       Right Left   Lids/Lashes Normal Normal   Conjunctiva/Sclera White and quiet White and quiet   Cornea Clear Clear   Anterior Chamber Deep and quiet Deep and quiet   Iris Round and reactive Round and reactive   Lens 1+ Nuclear sclerosis 1+ Nuclear sclerosis   Anterior Vitreous Normal Normal         Fundus Exam       Right Left   Posterior Vitreous Vitreous hemorrhage Normal   Disc Normal Normal   C/D Ratio 0.3 0.2   Macula Microaneurysms, no macular thickening, no exudates Microaneurysms, no macular thickening, no exudates   Vessels Severe nonproliferative diabetic retinopathy, peripheral nonperfusion Severe nonproliferative diabetic retinopathy, peripheral nonperfusion   Periphery No retinal holes or tears, mid peripheral and anterior dot blot hemorrhages of nonperfusion No retinal holes or tears, mid peripheral and anterior dot blot hemorrhages of nonperfusion            IMAGING AND PROCEDURES  Imaging  and Procedures for 08/16/21  OCT, Retina - OU - Both Eyes       Right Eye Quality was good. Scan locations included subfoveal. Central Foveal Thickness: 292. Progression has worsened. Findings include normal foveal contour.   Left Eye Quality was good. Scan locations included subfoveal. Central Foveal Thickness: 281. Progression has been stable. Findings include vitreomacular adhesion .   Notes OD incidental posterior vitreous detachment with diffuse hemorrhage  OU no evidence of active CME CSME     Color Fundus Photography Optos - OU - Both Eyes       Right Eye Progression has been stable. Disc findings include normal observations. Macula : microaneurysms.   Left Eye Progression has been stable. Disc findings include normal observations. Macula : microaneurysms.   Notes OU with severe nonproliferative diabetic retinopathy largely anterior to the equator.  NVE inferotemporal anteriorly OD  Some microaneurysms are present in the macula.  There is no clinically significant macular edema.  There is no need for intervention therapeutically OU at this time     Panretinal Photocoagulation - OD - Right Eye       Time Out Confirmed correct patient, procedure, site, and patient consented.   Anesthesia Topical anesthesia was  used. Anesthetic medications included Proparacaine 0.5%.   Laser Information The type of laser was diode. Color was yellow. The duration in seconds was 0.02. The spot size was 390 microns. Laser power was 280. Total spots was 887.   Post-op The patient tolerated the procedure well. There were no complications. The patient received written and verbal post procedure care education.   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  Will recommend peripheral anterior PRP for permanent detection in the retinal vasculature long-term.  I will explained the patient the vitreous hemorrhage will clear more slowly.      ICD-10-CM   1. 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 Severe nonproliferative diabetic retinopathy of left eye, without macular edema, associated with type 1 diabetes mellitus (HCC)  E10.3492 OCT, Retina - OU - Both Eyes    3. Vitreous hemorrhage of right eye (HCC)  H43.11 OCT, Retina - OU - Both Eyes    Color Fundus Photography Optos - OU - Both Eyes      1.  We will recommend panretinal photocoagulation right eye today this will provide for permanent treatment to the areas of retinal nonperfusion causative for neovascularization elsewhere.  And follow-up may add type vegF or further clearance  2.  OD and OS with extensive retinal nonperfusion with basis of longstanding type 1 diabetes mellitus as well as ongoing smoking history.  Patient is status and attempt to treat this area of retinal nonperfusion for long-term control and decrease vegF burden permanently.  3.  Patient states she may in fact still need antivegF in the future however this first PRP will halt neovascular tissue growth on a long-term basis.  Before I explained also the vitreous hemorrhage will not be changed with this laser photocoagulation but hopefully will not  worsen.  Also explained vitreous hemorrhage will not make her go blind and that we always have the opportunity for other forms of therapy up to and including surgical therapy to permanently clear the media opacity    Ophthalmic Meds Ordered this visit:  No orders of the defined types were placed in this encounter.      Return in about 4 weeks (around 09/13/2021) for dilate, COLOR FP, OCT, DILATE OU, possible OPTOS FFA R/L.  There are no Patient Instructions on file for this visit.   Explained the diagnoses, plan, and follow up with the patient and they expressed understanding.  Patient expressed understanding of the importance of proper follow up care.   Clent Demark Amey Hossain M.D. Diseases & Surgery of the Retina and Vitreous Retina & Diabetic [place] 08/16/21     Abbreviations: M myopia (nearsighted); A astigmatism; H hyperopia (farsighted); P presbyopia; Mrx spectacle prescription;  CTL contact lenses; OD right eye; OS  left eye; OU both eyes  XT exotropia; ET esotropia; PEK punctate epithelial keratitis; PEE punctate epithelial erosions; DES dry eye syndrome; MGD meibomian gland dysfunction; ATs artificial tears; PFAT's preservative free artificial tears; [place] nuclear sclerotic cataract; PSC posterior subcapsular cataract; ERM epi-retinal membrane; PVD posterior vitreous detachment; RD retinal detachment; DM diabetes mellitus; DR diabetic retinopathy; NPDR non-proliferative diabetic retinopathy; PDR proliferative diabetic retinopathy; CSME clinically significant macular edema; DME diabetic macular edema; dbh dot blot hemorrhages; CWS cotton wool spot; POAG primary open angle glaucoma; C/D cup-to-disc ratio; HVF humphrey visual field; GVF goldmann visual field; OCT optical coherence tomography; IOP intraocular pressure; BRVO Branch retinal vein occlusion; CRVO central retinal vein occlusion; CRAO central retinal artery occlusion; BRAO branch retinal artery occlusion; RT retinal tear; SB  scleral buckle; PPV pars plana vitrectomy; VH Vitreous hemorrhage; PRP panretinal laser photocoagulation; IVK intravitreal kenalog; VMT vitreomacular traction; MH Macular hole;  NVD neovascularization of the disc; NVE neovascularization elsewhere; AREDS age related eye disease study; ARMD age related macular degeneration; POAG primary open angle glaucoma; EBMD epithelial/anterior basement membrane dystrophy; ACIOL anterior chamber intraocular lens; IOL intraocular lens; PCIOL posterior chamber intraocular lens; Phaco/IOL phacoemulsification with intraocular lens placement; [place] photorefractive keratectomy; LASIK laser assisted in situ keratomileusis; HTN hypertension; DM diabetes mellitus; COPD chronic obstructive pulmonary disease

## 2021-08-21 ENCOUNTER — Other Ambulatory Visit: Payer: Self-pay | Admitting: Gastroenterology

## 2021-08-22 ENCOUNTER — Ambulatory Visit: Payer: Medicaid Other | Admitting: Gastroenterology

## 2021-08-29 ENCOUNTER — Telehealth: Payer: Self-pay | Admitting: Endocrinology

## 2021-08-29 ENCOUNTER — Telehealth: Payer: Self-pay | Admitting: Family Medicine

## 2021-08-29 ENCOUNTER — Other Ambulatory Visit (HOSPITAL_COMMUNITY): Payer: Self-pay

## 2021-08-29 DIAGNOSIS — E1142 Type 2 diabetes mellitus with diabetic polyneuropathy: Secondary | ICD-10-CM

## 2021-08-29 MED ORDER — DEXCOM G6 SENSOR MISC: 1.0000 | 3 refills | Status: DC

## 2021-08-29 NOTE — Telephone Encounter (Signed)
Pt is calling in stating that she is needing a refill on DEXCOM G-6 SENSORS.  Pharm:  [facility] in [place] on [address] [place].  Pt stated that her sensor will run out on today and she is scheduled to see Dr. [name].

## 2021-08-29 NOTE — Telephone Encounter (Signed)
Patient Advocate Encounter   Received notification from [facility] that prior authorization renewal for Dexcom G6 sensors is required by his/her insurance [place] [employer].   PA submitted on 08/29/21   Tracks#: [id number]  Status is pending    [facility] will continue to follow:  Patient Advocate Fax: [phone]

## 2021-08-29 NOTE — Addendum Note (Signed)
Addended by: Sarina Ill on: 08/29/2021 10:57 AM   Modules accepted: Orders

## 2021-08-29 NOTE — Telephone Encounter (Signed)
RX for dexcom sensors now sent into pharmacy.

## 2021-08-29 NOTE — Telephone Encounter (Signed)
error 

## 2021-08-30 ENCOUNTER — Other Ambulatory Visit (HOSPITAL_COMMUNITY): Payer: Self-pay

## 2021-08-30 NOTE — Telephone Encounter (Signed)
Patient Advocate Encounter  Prior Authorization for Dexcom G6 sensors has been approved.    [place] Tracks #: [id number]  Effective dates: 08/29/21 through 08/29/22  Per Test Claim Patients co-pay is $N/A.   Spoke with Pharmacy to Process.  Patient Advocate Fax: [phone]

## 2021-08-31 ENCOUNTER — Encounter (INDEPENDENT_AMBULATORY_CARE_PROVIDER_SITE_OTHER): Payer: Self-pay | Admitting: Ophthalmology

## 2021-08-31 ENCOUNTER — Ambulatory Visit (INDEPENDENT_AMBULATORY_CARE_PROVIDER_SITE_OTHER): Payer: Medicaid Other | Admitting: Ophthalmology

## 2021-08-31 ENCOUNTER — Other Ambulatory Visit: Payer: Self-pay

## 2021-08-31 DIAGNOSIS — E103591 Type 1 diabetes mellitus with proliferative diabetic retinopathy without macular edema, right eye: Secondary | ICD-10-CM | POA: Diagnosis not present

## 2021-08-31 DIAGNOSIS — H4311 Vitreous hemorrhage, right eye: Secondary | ICD-10-CM | POA: Diagnosis not present

## 2021-08-31 NOTE — Progress Notes (Signed)
08/31/2021     CHIEF COMPLAINT Patient presents for  Chief Complaint  Patient presents with   Eye Problem      HISTORY OF PRESENT ILLNESS: [name] is a 46 y.o. female who presents to the clinic today for:   HPI   WIP- fu OD, post laser procedure [facility] blurred. 2 weeks since laser PRP OD. Pt states "since the last vision, my right eye has been very blurry and I have seen more dots and lines. I had laser done 2 weeks ago." Last edited by Laurin Coder on 08/31/2021  3:05 PM.      Referring physician: Tamsen Roers, [place],  [place] [address]  HISTORICAL INFORMATION:   Selected notes from the MEDICAL RECORD NUMBER    Lab Results  Component Value Date   HGBA1C 8.5 (A) 06/03/2021     CURRENT MEDICATIONS: No current outpatient medications on file. (Ophthalmic Drugs)   No current facility-administered medications for this visit. (Ophthalmic Drugs)   Current Outpatient Medications (Other)  Medication Sig   ACCU-CHEK GUIDE test strip TEST ONCE IN THE MORNING AND ONCE AT BEDTIME. E11.9   Accu-Chek Softclix Lancets lancets 1 each by Other route 2 (two) times daily. E11.9   amLODipine (NORVASC) 5 MG tablet TAKE 1 TABLET (5 MG TOTAL) BY MOUTH DAILY. PLEASE MAKE FOLLOW UP APPOINTMENT FOR FUTURE REFILLS.   atorvastatin (LIPITOR) 10 MG tablet Take 1 tablet (10 mg total) by mouth at bedtime.   chlordiazePOXIDE (LIBRIUM) 25 MG capsule Take 1 capsule (25 mg total) by mouth 3 (three) times daily as needed for anxiety. 25 mg 3 times a day x 3 days, 25 mg 2 times a day for 3 days, 25mg  once a day for 3 days   Cholecalciferol (VITAMIN D3) 50 MCG (2000 UT) capsule Take by mouth daily.    Continuous Blood Gluc Sensor (DEXCOM G6 SENSOR) MISC 1 each by Does not apply route See admin instructions. Change sensor every 10 days; E10.9   Continuous Blood Gluc Transmit (DEXCOM G6 TRANSMITTER) MISC Use as directed. Change every 90 days   fluticasone (FLOVENT HFA) 110 MCG/ACT  inhaler INHALE 2 PUFFS INTO THE LUNGS IN THE MORNING AND AT BEDTIME.   fluticasone (FLOVENT HFA) 44 MCG/ACT inhaler Inhale 1 puff into the lungs 2 (two) times daily.   gabapentin (NEURONTIN) 300 MG capsule Take 1 capsule (300 mg total) by mouth 3 (three) times daily. As needed   glucagon 1 MG injection Inject 1 mg into the muscle once as needed.   Ibuprofen (ADVIL PO) Take 2-3 tablets by mouth every 6 (six) hours as needed.   insulin aspart (NOVOLOG) 100 UNIT/ML injection For use in pump, total of 90 units per day   levalbuterol (XOPENEX HFA) 45 MCG/ACT inhaler Inhale 1-2 puffs into the lungs 3 (three) times daily.   lisinopril (ZESTRIL) 10 MG tablet Take 1 tablet (10 mg total) by mouth daily.   meclizine (ANTIVERT) 25 MG tablet Take 1 tablet (25 mg total) by mouth 3 (three) times daily as needed for dizziness.   Multiple Vitamin (MULTIVITAMIN WITH MINERALS) TABS tablet Take 1 tablet by mouth daily.   NOVOLOG 100 UNIT/ML injection INJECT 40 UNITS DAILY PER INSULIN PUMP (MAX DOSAGE)   omeprazole (PRILOSEC) 40 MG capsule TAKE 1 CAPSULE BY MOUTH 2 TIMES DAILY. PLEASE KEEP YOUR 08-22-21 APPOINTMENT FOR ANY FURTHER REFILLS   ondansetron (ZOFRAN ODT) 4 MG disintegrating tablet Take 1 tablet (4 mg total) by  mouth every 8 (eight) hours as needed for nausea or vomiting.   oxyCODONE-acetaminophen (PERCOCET) 10-325 MG tablet SMARTSIG:1 Pill By Mouth 1 to 4 Times Daily PRN   PARoxetine (PAXIL) 20 MG tablet Take 10 mg by mouth daily. Take 1/2 tablet (10mg  total) by mouth once daily.   rOPINIRole (REQUIP) 1 MG tablet TAKE 1 TABLET BY MOUTH AT BEDTIME (PLZ SCHED APPT WITH NEW PROVIDER)  Please make appointment with Dr. Rex Kras.                 Needs to make appointment with Dr. Rex Kras.   sulfamethoxazole-trimethoprim (BACTRIM DS) 800-160 MG tablet Take 1 tablet by mouth 2 (two) times daily.   valACYclovir (VALTREX) 500 MG tablet Take 1 tablet (500 mg total) by mouth 2 (two) times daily. (Patient  taking differently: Take 500 mg by mouth as needed.)   zinc gluconate 50 MG tablet Take 50 mg by mouth daily.    No current facility-administered medications for this visit. (Other)      REVIEW OF SYSTEMS: ROS   Negative for: Constitutional, Gastrointestinal, Neurological, Skin, Genitourinary, Musculoskeletal, HENT, Endocrine, Cardiovascular, Eyes, Respiratory, Psychiatric, Allergic/Imm, Heme/Lymph Last edited by Hurman Horn, MD on 08/31/2021  3:26 PM.       ALLERGIES No Known Allergies  PAST MEDICAL HISTORY Past Medical History:  Diagnosis Date   Anxiety state, unspecified 03/26/2009   ASTHMA 02/02/2007   Asthma    DEPRESSION 02/16/2009   DIABETES MELLITUS, TYPE I 02/02/2007   Endometriosis    GERD 12/21/2008   Herpes    at 18   Hyperlipidemia    Lesion of vocal cord    Osteoarthritis    Severe nonproliferative diabetic retinopathy of right eye, without macular edema, associated with type 1 diabetes mellitus ([place]) 11/04/2019   Shortness of breath 09/25/2007   Vocal fold leukoplakia    Past Surgical History:  Procedure Laterality Date   ABDOMINAL HYSTERECTOMY  2009   BSO   DIRECT LARYNGOSCOPY N/A 08/17/2017   Procedure: DIRECT LARYNGOSCOPY WITH OPERATING TELESCOPE WITH BIOPSY;  Surgeon: Helayne Seminole, MD;  Location: [facility];  Service: ENT;  Laterality: N/A;   NASOPHARYNGEAL BIOPSY Right 08/17/2017   Procedure: NASOPHARYNGEAL BIOPSY RIGHT NASAL LESION;  Surgeon: Helayne Seminole, MD;  Location: [facility];  Service: ENT;  Laterality: Right;    FAMILY HISTORY Family History  Problem Relation Age of Onset   Diabetes Mother    Diabetes Maternal Aunt    Emphysema Maternal Uncle    Diabetes Maternal Grandfather    Lung cancer Maternal Aunt    Colon cancer Neg Hx    Kidney disease Neg Hx    Liver disease Neg Hx    Esophageal cancer Neg Hx    Rectal cancer Neg Hx    Stomach cancer Neg Hx     SOCIAL HISTORY Social History   Tobacco Use   Smoking status: Every Day     Packs/day: 1.50    Years: 25.00    Pack years: 37.50    Types: Cigarettes   Smokeless tobacco: Never  Vaping Use   Vaping Use: Former   Quit date: 02/23/2017   Devices: only used tobacco   Substance Use Topics   Alcohol use: Not Currently    Alcohol/week: 0.0 standard drinks   Drug use: No         OPHTHALMIC EXAM:  Base Eye Exam     Visual Acuity (ETDRS)       Right Left  Dist cc 20/40 -2 20/20 -1   Dist ph cc NI     Correction: Contacts         Tonometry (Tonopen, 3:08 PM)       Right Left   Pressure 12 10         Pupils       Pupils Dark Light APD   Right PERRL 4 3 None   Left PERRL 4 3 None         Extraocular Movement       Right Left    Full Full         Neuro/Psych     Oriented x3: Yes   Mood/Affect: Normal         Dilation     Both eyes: 1.0% Mydriacyl, 2.5% Phenylephrine @ 3:08 PM           Slit Lamp and Fundus Exam     External Exam       Right Left   External Normal Normal         Slit Lamp Exam       Right Left   Lids/Lashes Normal Normal   Conjunctiva/Sclera White and quiet White and quiet   Cornea Clear Clear   Anterior Chamber Deep and quiet Deep and quiet   Iris Round and reactive Round and reactive   Lens 1+ Nuclear sclerosis 1+ Nuclear sclerosis   Anterior Vitreous Normal Normal         Fundus Exam       Right Left   Posterior Vitreous Vitreous hemorrhage, newly dispersed and now in the visual axis Normal   Disc Normal Normal   C/D Ratio 0.3 0.2   Macula Microaneurysms, no macular thickening, no exudates Microaneurysms, no macular thickening, no exudates   Vessels PDR-active Severe nonproliferative diabetic retinopathy, peripheral nonperfusion   Periphery No retinal holes or tears, mid peripheral and anterior dot blot hemorrhages of nonperfusion with better PRP, good anterior PRP No retinal holes or tears, mid peripheral and anterior dot blot hemorrhages of nonperfusion             IMAGING AND PROCEDURES  Imaging and Procedures for 08/31/21  Color Fundus Photography Optos - OU - Both Eyes       Right Eye Progression has been stable. Disc findings include normal observations. Macula : microaneurysms.   Left Eye Progression has been stable. Disc findings include normal observations. Macula : microaneurysms.   Notes OU with severe nonproliferative diabetic retinopathy largely anterior to the equator.  NVE inferotemporal anteriorly OD, overall likely less active post PRP, PRP seen temporally as well as inferiorly new dense hemorrhage in the visual axis has dispersed OD however  OS no change very severe NPDR  Some microaneurysms are present in the macula.  There is no clinically significant macular edema.  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  As hemorrhage clears, will likely need completion of PRP will await as new hemorrhage will delay that delivery.  Follow-up visit here in 4 to 6 weeks  Vitreous hemorrhage of right eye ([place]) OD dispersion of vitreous hemorrhage accounts for acuity.  I explained the patient that no one goes blind from vitreous hemorrhage  from diabetic retinopathy alone.  I used the flower in the garden analogy.  The hemorrhage being like a blossom weeds growing, laser is designed to stop the roots of the weeks from getting worse.  Patient suggested to have a head slightly higher than the heart at nighttime so the blood will consider well with gravity every night as well as during the day  I reassured the patient that if this does not clear vitrectomy that is surgery can be undertaken to clear this out completely     ICD-10-CM   1. Proliferative diabetic  retinopathy of right eye without macular edema associated with type 1 diabetes mellitus (HCC)  E10.3591 Color Fundus Photography Optos - OU - Both Eyes    2. Vitreous hemorrhage of right eye (HCC)  H43.11 Color Fundus Photography Optos - OU - Both Eyes      1.  OS stable severe NPDR  2.  The with PDR, and stabilizing post recent PRP yet with now dispersed vitreous hemorrhage accompanying her clearance of PDR, also accounting for decreasing vision with involvement of the visual axis  3.  Risk benefits and potential need for vitrectomy reviewed with the patient.  If symptoms begin to hamper activities of daily living, vitrectomy could be undertaken and consideration  Ophthalmic Meds Ordered this visit:  No orders of the defined types were placed in this encounter.      Return in about 4 weeks (around 09/28/2021) for OD, dilate, COLOR FP.  There are no Patient Instructions on file for this visit.   Explained the diagnoses, plan, and follow up with the patient and they expressed understanding.  Patient expressed understanding of the importance of proper follow up care.   Clent Demark Gizel Riedlinger M.D. Diseases & Surgery of the Retina and Vitreous [facility] [place] 08/31/21     Abbreviations: M myopia (nearsighted); A astigmatism; H hyperopia (farsighted); P presbyopia; Mrx spectacle prescription;  CTL contact lenses; OD right eye; OS left eye; OU both eyes  XT exotropia; ET esotropia; PEK punctate epithelial keratitis; PEE punctate epithelial erosions; DES dry eye syndrome; MGD meibomian gland dysfunction; ATs artificial tears; PFAT's preservative free artificial tears; [place] nuclear sclerotic cataract; PSC posterior subcapsular cataract; ERM epi-retinal membrane; PVD posterior vitreous detachment; RD retinal detachment; DM diabetes mellitus; DR diabetic retinopathy; NPDR non-proliferative diabetic retinopathy; PDR proliferative diabetic retinopathy; CSME clinically significant macular  edema; DME diabetic macular edema; dbh dot blot hemorrhages; CWS cotton wool spot; POAG primary open angle glaucoma; C/D cup-to-disc ratio; HVF humphrey visual field; GVF goldmann visual field; OCT optical coherence tomography; IOP intraocular pressure; BRVO Branch retinal vein occlusion; CRVO central retinal vein occlusion; CRAO central retinal artery occlusion; BRAO branch retinal artery occlusion; RT retinal tear; SB scleral buckle; PPV pars plana vitrectomy; VH Vitreous hemorrhage; PRP panretinal laser photocoagulation; IVK intravitreal kenalog; VMT vitreomacular traction; MH Macular hole;  NVD neovascularization of the disc; NVE neovascularization elsewhere; AREDS age related eye disease study; ARMD age related macular degeneration; POAG primary open angle glaucoma; EBMD epithelial/anterior basement membrane dystrophy; ACIOL anterior chamber intraocular lens; IOL intraocular lens; PCIOL posterior chamber intraocular lens; Phaco/IOL phacoemulsification with intraocular lens placement; [place] photorefractive keratectomy; LASIK laser assisted in situ keratomileusis; HTN hypertension; DM diabetes mellitus; COPD chronic obstructive pulmonary disease

## 2021-08-31 NOTE — Assessment & Plan Note (Signed)
PDR OD confirmed with vitreous hemorrhage, good that good PRP has been applied inferior and temporally.  As hemorrhage clears, will likely need completion of PRP will await as new hemorrhage will delay that delivery.  Follow-up visit here in 4 to 6 weeks

## 2021-08-31 NOTE — Assessment & Plan Note (Signed)
OD dispersion of vitreous hemorrhage accounts for acuity.  I explained the patient that no one goes blind from vitreous hemorrhage from diabetic retinopathy alone.  I used the flower in the garden analogy.  The hemorrhage being like a blossom weeds growing, laser is designed to stop the roots of the weeks from getting worse.  Patient suggested to have a head slightly higher than the heart at nighttime so the blood will consider well with gravity every night as well as during the day  I reassured the patient that if this does not clear vitrectomy that is surgery can be undertaken to clear this out completely

## 2021-08-31 NOTE — Progress Notes (Signed)
08/31/2021     CHIEF COMPLAINT Patient presents for  Chief Complaint  Patient presents with   Eye Problem      HISTORY OF PRESENT ILLNESS: [name] is a 46 y.o. female who presents to the clinic today for:   HPI   WIP- fu OD, post laser procedure [facility] blurred. 2 weeks since laser PRP OD. Pt states "since the last vision, my right eye has been very blurry and I have seen more dots and lines. I had laser done 2 weeks ago." Last edited by Laurin Coder on 08/31/2021  3:05 PM.      Referring physician: Tamsen Roers, [place],  [place] [address]  HISTORICAL INFORMATION:   Selected notes from the MEDICAL RECORD NUMBER    Lab Results  Component Value Date   HGBA1C 8.5 (A) 06/03/2021     CURRENT MEDICATIONS: No current outpatient medications on file. (Ophthalmic Drugs)   No current facility-administered medications for this visit. (Ophthalmic Drugs)   Current Outpatient Medications (Other)  Medication Sig   ACCU-CHEK GUIDE test strip TEST ONCE IN THE MORNING AND ONCE AT BEDTIME. E11.9   Accu-Chek Softclix Lancets lancets 1 each by Other route 2 (two) times daily. E11.9   amLODipine (NORVASC) 5 MG tablet TAKE 1 TABLET (5 MG TOTAL) BY MOUTH DAILY. PLEASE MAKE FOLLOW UP APPOINTMENT FOR FUTURE REFILLS.   atorvastatin (LIPITOR) 10 MG tablet Take 1 tablet (10 mg total) by mouth at bedtime.   chlordiazePOXIDE (LIBRIUM) 25 MG capsule Take 1 capsule (25 mg total) by mouth 3 (three) times daily as needed for anxiety. 25 mg 3 times a day x 3 days, 25 mg 2 times a day for 3 days, 25mg  once a day for 3 days   Cholecalciferol (VITAMIN D3) 50 MCG (2000 UT) capsule Take by mouth daily.    Continuous Blood Gluc Sensor (DEXCOM G6 SENSOR) MISC 1 each by Does not apply route See admin instructions. Change sensor every 10 days; E10.9   Continuous Blood Gluc Transmit (DEXCOM G6 TRANSMITTER) MISC Use as directed. Change every 90 days   fluticasone (FLOVENT HFA) 110 MCG/ACT  inhaler INHALE 2 PUFFS INTO THE LUNGS IN THE MORNING AND AT BEDTIME.   fluticasone (FLOVENT HFA) 44 MCG/ACT inhaler Inhale 1 puff into the lungs 2 (two) times daily.   gabapentin (NEURONTIN) 300 MG capsule Take 1 capsule (300 mg total) by mouth 3 (three) times daily. As needed   glucagon 1 MG injection Inject 1 mg into the muscle once as needed.   Ibuprofen (ADVIL PO) Take 2-3 tablets by mouth every 6 (six) hours as needed.   insulin aspart (NOVOLOG) 100 UNIT/ML injection For use in pump, total of 90 units per day   levalbuterol (XOPENEX HFA) 45 MCG/ACT inhaler Inhale 1-2 puffs into the lungs 3 (three) times daily.   lisinopril (ZESTRIL) 10 MG tablet Take 1 tablet (10 mg total) by mouth daily.   meclizine (ANTIVERT) 25 MG tablet Take 1 tablet (25 mg total) by mouth 3 (three) times daily as needed for dizziness.   Multiple Vitamin (MULTIVITAMIN WITH MINERALS) TABS tablet Take 1 tablet by mouth daily.   NOVOLOG 100 UNIT/ML injection INJECT 40 UNITS DAILY PER INSULIN PUMP (MAX DOSAGE)   omeprazole (PRILOSEC) 40 MG capsule TAKE 1 CAPSULE BY MOUTH 2 TIMES DAILY. PLEASE KEEP YOUR 08-22-21 APPOINTMENT FOR ANY FURTHER REFILLS   ondansetron (ZOFRAN ODT) 4 MG disintegrating tablet Take 1 tablet (4 mg total) by  mouth every 8 (eight) hours as needed for nausea or vomiting.   oxyCODONE-acetaminophen (PERCOCET) 10-325 MG tablet SMARTSIG:1 Pill By Mouth 1 to 4 Times Daily PRN   PARoxetine (PAXIL) 20 MG tablet Take 10 mg by mouth daily. Take 1/2 tablet (10mg  total) by mouth once daily.   rOPINIRole (REQUIP) 1 MG tablet TAKE 1 TABLET BY MOUTH AT BEDTIME (PLZ SCHED APPT WITH NEW PROVIDER)  Please make appointment with Dr. Rex Kras.                 Needs to make appointment with Dr. Rex Kras.   sulfamethoxazole-trimethoprim (BACTRIM DS) 800-160 MG tablet Take 1 tablet by mouth 2 (two) times daily.   valACYclovir (VALTREX) 500 MG tablet Take 1 tablet (500 mg total) by mouth 2 (two) times daily. (Patient  taking differently: Take 500 mg by mouth as needed.)   zinc gluconate 50 MG tablet Take 50 mg by mouth daily.    No current facility-administered medications for this visit. (Other)      REVIEW OF SYSTEMS: ROS   Negative for: Constitutional, Gastrointestinal, Neurological, Skin, Genitourinary, Musculoskeletal, HENT, Endocrine, Cardiovascular, Eyes, Respiratory, Psychiatric, Allergic/Imm, Heme/Lymph Last edited by Hurman Horn, MD on 08/31/2021  3:26 PM.       ALLERGIES No Known Allergies  PAST MEDICAL HISTORY Past Medical History:  Diagnosis Date   Anxiety state, unspecified 03/26/2009   ASTHMA 02/02/2007   Asthma    DEPRESSION 02/16/2009   DIABETES MELLITUS, TYPE I 02/02/2007   Endometriosis    GERD 12/21/2008   Herpes    at 18   Hyperlipidemia    Lesion of vocal cord    Osteoarthritis    Severe nonproliferative diabetic retinopathy of right eye, without macular edema, associated with type 1 diabetes mellitus ([place]) 11/04/2019   Shortness of breath 09/25/2007   Vocal fold leukoplakia    Past Surgical History:  Procedure Laterality Date   ABDOMINAL HYSTERECTOMY  2009   BSO   DIRECT LARYNGOSCOPY N/A 08/17/2017   Procedure: DIRECT LARYNGOSCOPY WITH OPERATING TELESCOPE WITH BIOPSY;  Surgeon: Helayne Seminole, MD;  Location: [facility];  Service: ENT;  Laterality: N/A;   NASOPHARYNGEAL BIOPSY Right 08/17/2017   Procedure: NASOPHARYNGEAL BIOPSY RIGHT NASAL LESION;  Surgeon: Helayne Seminole, MD;  Location[facility];  Service: ENT;  Laterality: Right;    FAMILY HISTORY Family History  Problem Relation Age of Onset   Diabetes Mother    Diabetes Maternal Aunt    Emphysema Maternal Uncle    Diabetes Maternal Grandfather    Lung cancer Maternal Aunt    Colon cancer Neg Hx    Kidney disease Neg Hx    Liver disease Neg Hx    Esophageal cancer Neg Hx    Rectal cancer Neg Hx    Stomach cancer Neg Hx     SOCIAL HISTORY Social History   Tobacco Use   Smoking status: Every Day     Packs/day: 1.50    Years: 25.00    Pack years: 37.50    Types: Cigarettes   Smokeless tobacco: Never  Vaping Use   Vaping Use: Former   Quit date: 02/23/2017   Devices: only used tobacco   Substance Use Topics   Alcohol use: Not Currently    Alcohol/week: 0.0 standard drinks   Drug use: No         OPHTHALMIC EXAM:  Base Eye Exam     Visual Acuity (ETDRS)       Right Left  Dist cc 20/40 -2 20/20 -1   Dist ph cc NI     Correction: Contacts         Tonometry (Tonopen, 3:08 PM)       Right Left   Pressure 12 10         Pupils       Pupils Dark Light APD   Right PERRL 4 3 None   Left PERRL 4 3 None         Extraocular Movement       Right Left    Full Full         Neuro/Psych     Oriented x3: Yes   Mood/Affect: Normal         Dilation     Both eyes: 1.0% Mydriacyl, 2.5% Phenylephrine @ 3:08 PM           Slit Lamp and Fundus Exam     External Exam       Right Left   External Normal Normal         Slit Lamp Exam       Right Left   Lids/Lashes Normal Normal   Conjunctiva/Sclera White and quiet White and quiet   Cornea Clear Clear   Anterior Chamber Deep and quiet Deep and quiet   Iris Round and reactive Round and reactive   Lens 1+ Nuclear sclerosis 1+ Nuclear sclerosis   Anterior Vitreous Normal Normal         Fundus Exam       Right Left   Posterior Vitreous Vitreous hemorrhage, newly dispersed and now in the visual axis Normal   Disc Normal Normal   C/D Ratio 0.3 0.2   Macula Microaneurysms, no macular thickening, no exudates Microaneurysms, no macular thickening, no exudates   Vessels PDR-active Severe nonproliferative diabetic retinopathy, peripheral nonperfusion   Periphery No retinal holes or tears, mid peripheral and anterior dot blot hemorrhages of nonperfusion with better PRP, good anterior PRP No retinal holes or tears, mid peripheral and anterior dot blot hemorrhages of nonperfusion             IMAGING AND PROCEDURES  Imaging and Procedures for 08/31/21  Color Fundus Photography Optos - OU - Both Eyes       Right Eye Progression has been stable. Disc findings include normal observations. Macula : microaneurysms.   Left Eye Progression has been stable. Disc findings include normal observations. Macula : microaneurysms.   Notes OU with severe nonproliferative diabetic retinopathy largely anterior to the equator.  NVE inferotemporal anteriorly OD, overall likely less active post PRP, PRP seen temporally as well as inferiorly new dense hemorrhage in the visual axis has dispersed OD however  OS no change very severe NPDR  Some microaneurysms are present in the macula.  There is no clinically significant macular edema.  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  As hemorrhage clears, will likely need completion of PRP will await as new hemorrhage will delay that delivery.  Follow-up visit here in 4 to 6 weeks  Vitreous hemorrhage of right eye ([place]) OD dispersion of vitreous hemorrhage accounts for acuity.  I explained the patient that no one goes blind from vitreous hemorrhage  from diabetic retinopathy alone.  I used the flower in the garden analogy.  The hemorrhage being like a blossom weeds growing, laser is designed to stop the roots of the weeks from getting worse.  Patient suggested to have a head slightly higher than the heart at nighttime so the blood will consider well with gravity every night as well as during the day  I reassured the patient that if this does not clear vitrectomy that is surgery can be undertaken to clear this out completely     ICD-10-CM   1. Proliferative diabetic  retinopathy of right eye without macular edema associated with type 1 diabetes mellitus (HCC)  E10.3591 Color Fundus Photography Optos - OU - Both Eyes    2. Vitreous hemorrhage of right eye (HCC)  H43.11 Color Fundus Photography Optos - OU - Both Eyes      1.  OS stable with very severe NPDR  2.  OD, stable overall, no change in PDR presents, good PRP anterior temporally and inferiorly, dispersed vitreous hemorrhage accounts for new visual acuity change  3.  We have discussed the potential for need for vitrectomy should the symptoms not improve substantially or they begin to hamper her activities of daily living.  Ophthalmic Meds Ordered this visit:  No orders of the defined types were placed in this encounter.      Return in about 4 weeks (around 09/28/2021) for OD, dilate, COLOR FP.  There are no Patient Instructions on file for this visit.   Explained the diagnoses, plan, and follow up with the patient and they expressed understanding.  Patient expressed understanding of the importance of proper follow up care.   Clent Demark Triton Heidrich M.D. Diseases & Surgery of the Retina and Vitreous [facility]abetic [place] 08/31/21     Abbreviations: M myopia (nearsighted); A astigmatism; H hyperopia (farsighted); P presbyopia; Mrx spectacle prescription;  CTL contact lenses; OD right eye; OS left eye; OU both eyes  XT exotropia; ET esotropia; PEK punctate epithelial keratitis; PEE punctate epithelial erosions; DES dry eye syndrome; MGD meibomian gland dysfunction; ATs artificial tears; PFAT's preservative free artificial tears; [place] nuclear sclerotic cataract; PSC posterior subcapsular cataract; ERM epi-retinal membrane; PVD posterior vitreous detachment; RD retinal detachment; DM diabetes mellitus; DR diabetic retinopathy; NPDR non-proliferative diabetic retinopathy; PDR proliferative diabetic retinopathy; CSME clinically significant macular edema; DME diabetic macular edema; dbh dot blot  hemorrhages; CWS cotton wool spot; POAG primary open angle glaucoma; C/D cup-to-disc ratio; HVF humphrey visual field; GVF goldmann visual field; OCT optical coherence tomography; IOP intraocular pressure; BRVO Branch retinal vein occlusion; CRVO central retinal vein occlusion; CRAO central retinal artery occlusion; BRAO branch retinal artery occlusion; RT retinal tear; SB scleral buckle; PPV pars plana vitrectomy; VH Vitreous hemorrhage; PRP panretinal laser photocoagulation; IVK intravitreal kenalog; VMT vitreomacular traction; MH Macular hole;  NVD neovascularization of the disc; NVE neovascularization elsewhere; AREDS age related eye disease study; ARMD age related macular degeneration; POAG primary open angle glaucoma; EBMD epithelial/anterior basement membrane dystrophy; ACIOL anterior chamber intraocular lens; IOL intraocular lens; PCIOL posterior chamber intraocular lens; Phaco/IOL phacoemulsification with intraocular lens placement; [place] photorefractive keratectomy; LASIK laser assisted in situ keratomileusis; HTN hypertension; DM diabetes mellitus; COPD chronic obstructive pulmonary disease

## 2021-09-01 ENCOUNTER — Telehealth: Payer: Self-pay | Admitting: Internal Medicine

## 2021-09-01 ENCOUNTER — Other Ambulatory Visit: Payer: Self-pay

## 2021-09-01 MED ORDER — AMLODIPINE BESYLATE 5 MG PO TABS
5.0000 mg | ORAL_TABLET | Freq: Every day | ORAL | 1 refills | Status: DC
Start: 2021-09-01 — End: 2021-09-01

## 2021-09-01 MED ORDER — AMLODIPINE BESYLATE 5 MG PO TABS
5.0000 mg | ORAL_TABLET | Freq: Every day | ORAL | 1 refills | Status: DC
Start: 2021-09-01 — End: 2022-08-03

## 2021-09-01 NOTE — Telephone Encounter (Signed)
Refill for amLODipine (NORVASC) 5 MG tablet sent to [facility] [place].

## 2021-09-01 NOTE — Telephone Encounter (Signed)
°*  STAT* If patient is at the pharmacy, call can be transferred to refill team.   1. Which medications need to be refilled? (please list name of each medication and dose if known) amLODipine (NORVASC) 5 MG tablet  2. Which pharmacy/location (including street and city if local pharmacy) is medication to be sent to?  [id number] - [place], [place] - [address]  3. Do they need a 30 day or 90 day supply? 90 day  Patient is out of medication

## 2021-09-02 ENCOUNTER — Ambulatory Visit: Payer: Medicaid Other | Admitting: Physician Assistant

## 2021-09-07 ENCOUNTER — Ambulatory Visit (INDEPENDENT_AMBULATORY_CARE_PROVIDER_SITE_OTHER): Payer: Medicaid Other | Admitting: Endocrinology

## 2021-09-07 ENCOUNTER — Other Ambulatory Visit: Payer: Self-pay

## 2021-09-07 ENCOUNTER — Encounter: Payer: Self-pay | Admitting: Endocrinology

## 2021-09-07 VITALS — BP 130/82 | HR 108 | Ht 64.0 in | Wt 120.6 lb

## 2021-09-07 DIAGNOSIS — E1142 Type 2 diabetes mellitus with diabetic polyneuropathy: Secondary | ICD-10-CM | POA: Diagnosis not present

## 2021-09-07 LAB — POCT GLYCOSYLATED HEMOGLOBIN (HGB A1C): Hemoglobin A1C: 8.8 % — AB (ref 4.0–5.6)

## 2021-09-07 NOTE — Patient Instructions (Addendum)
Please take these pump settings:  basal rate of 0.2 units/hr.   bolus of 2-5 units/meal, but try to increase this to the higher part of this range, for a total of approx 8 units per day.      correction bolus (which some people call "sensitivity," or "insulin sensitivity ratio," or just "isr") of 1 unit for each 200 by which your glucose exceeds 150.   Please see [name], to see why the pump is not delivering the boluses.   check your blood sugar 4 times a day: before the 3 meals, and at bedtime.  also check if you have symptoms of your blood sugar being too high or too low.  please keep a record of the readings and bring it to your next appointment here (or you can bring the meter itself).  You can write it on any piece of paper.  please call us sooner if your blood sugar goes below 70, or if you have a lot of readings over 200.   Please come back for a follow-up appointment in 2 months.

## 2021-09-07 NOTE — Progress Notes (Signed)
Subjective:    Patient ID: [name], female    DOB: 05/10/1976, 46 y.o.   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  Therapy: insulin since dx, pump rx (now tandem, with Dexcom CGM), since 2017.   DKA: never Severe hypoglycemia: never Pancreatitis: never SDOH: therapy has been limited by chronic noncompliance.   Other:  she has had TAH; she eats at approx 10AM and 7PM.   Interval history: she takes these pump settings:   basal rate of 0.2 units/hr.   bolus of 2-5 units/meal, but try to increase this to the higher part of this range, for a total of approx 8 units per day.    correction bolus (which some people call "sensitivity," or "insulin sensitivity ratio," or just "isr") of 1 unit for each 200 by which your glucose exceeds 150.  TDD is 13 units (43% basal, 47% meal bolus).  Control IQ in on 95% of the day.  I reviewed continuous glucose monitor data.  Glucose varies from 106-400.  It is in general highest 3PM-11PM.  It is lowest at 5AM-11AM.  It declines overnight.  She reports non-delivery of insulin Past Medical History:  Diagnosis Date   Anxiety state, unspecified 03/26/2009   ASTHMA 02/02/2007   Asthma    DEPRESSION 02/16/2009   DIABETES MELLITUS, TYPE I 02/02/2007   Endometriosis    GERD 12/21/2008   Herpes    at 18   Hyperlipidemia    Lesion of vocal cord    Osteoarthritis    Severe nonproliferative diabetic retinopathy of right eye, without macular edema, associated with type 1 diabetes mellitus ([place]) 11/04/2019   Shortness of breath 09/25/2007   Vocal fold leukoplakia     Past Surgical History:  Procedure Laterality Date   ABDOMINAL HYSTERECTOMY  2009   BSO   DIRECT LARYNGOSCOPY N/A 08/17/2017   Procedure: DIRECT LARYNGOSCOPY WITH OPERATING TELESCOPE WITH BIOPSY;  Surgeon: Helayne Seminole, MD;  Location: [facility];  Service: ENT;  Laterality: N/A;   NASOPHARYNGEAL BIOPSY Right 08/17/2017    Procedure: NASOPHARYNGEAL BIOPSY RIGHT NASAL LESION;  Surgeon: Helayne Seminole, MD;  [facility];  Service: ENT;  Laterality: Right;    Social History   Socioeconomic History   Marital status: Legally Separated    Spouse name: Not on file   Number of children: 2   Years of education: Not on file   Highest education level: Not on file  Occupational History    Employer: UNEMPLOYED  Tobacco Use   Smoking status: Every Day    Packs/day: 1.50    Years: 25.00    Pack years: 37.50    Types: Cigarettes   Smokeless tobacco: Never  Vaping Use   Vaping Use: Former   Quit date: 02/23/2017   Devices: only used tobacco   Substance and Sexual Activity   Alcohol use: Not Currently    Alcohol/week: 0.0 standard drinks   Drug use: No   Sexual activity: Yes    Partners: Male    Birth control/protection: None    Comment: 1st intercourse-14, partners- 107, current partner- 4 months  Other Topics Concern   Not on file  Social History Narrative   2 children--pt has custody. Husband pays child support.   Daily Caffeine Use-3 2 liters a day   Pt does not get regular exercise.   Social Determinants of Health   Financial Resource Strain: Not  on file  Food Insecurity: Not on file  Transportation Needs: Not on file  Physical Activity: Not on file  Stress: Not on file  Social Connections: Not on file  Intimate Partner Violence: Not on file    Current Outpatient Medications on File Prior to Visit  Medication Sig Dispense Refill   ACCU-CHEK GUIDE test strip TEST ONCE IN THE MORNING AND ONCE AT BEDTIME. E11.9 100 strip 7   Accu-Chek Softclix Lancets lancets 1 each by Other route 2 (two) times daily. E11.9 180 each 2   amLODipine (NORVASC) 5 MG tablet Take 1 tablet (5 mg total) by mouth daily. Please make follow up appointment for future refills.TAKE 1 TABLET (5 MG TOTAL) BY MOUTH DAILY. 90 tablet 1   atorvastatin (LIPITOR) 10 MG tablet Take 1 tablet (10 mg total) by mouth at bedtime. 90  tablet 1   chlordiazePOXIDE (LIBRIUM) 25 MG capsule Take 1 capsule (25 mg total) by mouth 3 (three) times daily as needed for anxiety. 25 mg 3 times a day x 3 days, 25 mg 2 times a day for 3 days, 25mg  once a day for 3 days 18 capsule 0   Cholecalciferol (VITAMIN D3) 50 MCG (2000 UT) capsule Take by mouth daily.      Continuous Blood Gluc Sensor (DEXCOM G6 SENSOR) MISC 1 each by Does not apply route See admin instructions. Change sensor every 10 days; E10.9 9 each 3   Continuous Blood Gluc Transmit (DEXCOM G6 TRANSMITTER) MISC Use as directed. Change every 90 days 1 each 3   fluticasone (FLOVENT HFA) 110 MCG/ACT inhaler INHALE 2 PUFFS INTO THE LUNGS IN THE MORNING AND AT BEDTIME. 12 each 1   fluticasone (FLOVENT HFA) 44 MCG/ACT inhaler Inhale 1 puff into the lungs 2 (two) times daily. 1 each 5   gabapentin (NEURONTIN) 300 MG capsule Take 1 capsule (300 mg total) by mouth 3 (three) times daily. As needed 270 capsule 1   glucagon 1 MG injection Inject 1 mg into the muscle once as needed. 1 each 12   Ibuprofen (ADVIL PO) Take 2-3 tablets by mouth every 6 (six) hours as needed.     insulin aspart (NOVOLOG) 100 UNIT/ML injection For use in pump, total of 90 units per day 90 mL 3   lisinopril (ZESTRIL) 10 MG tablet Take 1 tablet (10 mg total) by mouth daily. 90 tablet 0   meclizine (ANTIVERT) 25 MG tablet Take 1 tablet (25 mg total) by mouth 3 (three) times daily as needed for dizziness. 30 tablet 0   Multiple Vitamin (MULTIVITAMIN WITH MINERALS) TABS tablet Take 1 tablet by mouth daily.     NOVOLOG 100 UNIT/ML injection INJECT 40 UNITS DAILY PER INSULIN PUMP (MAX DOSAGE) 10 mL 3   omeprazole (PRILOSEC) 40 MG capsule TAKE 1 CAPSULE BY MOUTH 2 TIMES DAILY. PLEASE KEEP YOUR 08-22-21 APPOINTMENT FOR ANY FURTHER REFILLS 60 capsule 0   ondansetron (ZOFRAN ODT) 4 MG disintegrating tablet Take 1 tablet (4 mg total) by mouth every 8 (eight) hours as needed for nausea or vomiting. 20 tablet 0    oxyCODONE-acetaminophen (PERCOCET) 10-325 MG tablet SMARTSIG:1 Pill By Mouth 1 to 4 Times Daily PRN     rOPINIRole (REQUIP) 1 MG tablet TAKE 1 TABLET BY MOUTH AT BEDTIME (PLZ SCHED APPT WITH NEW PROVIDER)  Please make appointment with Dr. Rex Kras.                 Needs to make appointment with Dr. Rex Kras. 90 tablet 0  valACYclovir (VALTREX) 500 MG tablet Take 1 tablet (500 mg total) by mouth 2 (two) times daily. (Patient taking differently: Take 500 mg by mouth as needed.) 10 tablet 3   zinc gluconate 50 MG tablet Take 50 mg by mouth daily.      levalbuterol (XOPENEX HFA) 45 MCG/ACT inhaler Inhale 1-2 puffs into the lungs 3 (three) times daily. 1 Inhaler 3   PARoxetine (PAXIL) 20 MG tablet Take 10 mg by mouth daily. Take 1/2 tablet (10mg  total) by mouth once daily.     sulfamethoxazole-trimethoprim (BACTRIM DS) 800-160 MG tablet Take 1 tablet by mouth 2 (two) times daily.     No current facility-administered medications on file prior to visit.    No Known Allergies  Family History  Problem Relation Age of Onset   Diabetes Mother    Diabetes Maternal Aunt    Emphysema Maternal Uncle    Diabetes Maternal Grandfather    Lung cancer Maternal Aunt    Colon cancer Neg Hx    Kidney disease Neg Hx    Liver disease Neg Hx    Esophageal cancer Neg Hx    Rectal cancer Neg Hx    Stomach cancer Neg Hx     BP 130/82 (BP Location: Left Arm, Patient Position: Sitting, Cuff Size: Normal)    Pulse (!) 108    Ht 5\' 4"  (1.626 m)    Wt 120 lb 9.6 oz (54.7 kg)    SpO2 96%    BMI 20.70 kg/m   Review of Systems She denies hypoglycemia.    Objective:   Physical Exam    A1c=8.8%    Assessment & Plan:  Type 1 DM: uncontrolled Device malfunction: ref CDE.   Patient Instructions  Please take these pump settings:  basal rate of 0.2 units/hr.   bolus of 2-5 units/meal, but try to increase this to the higher part of this range, for a total of approx 8 units per day.      correction  bolus (which some people call "sensitivity," or "insulin sensitivity ratio," or just "isr") of 1 unit for each 200 by which your glucose exceeds 150.   Please see Vaughan Basta, to see why the pump is not delivering the boluses.   check your blood sugar 4 times a day: before the 3 meals, and at bedtime.  also check if you have symptoms of your blood sugar being too high or too low.  please keep a record of the readings and bring it to your next appointment here (or you can bring the meter itself).  You can write it on any piece of paper.  please call us sooner if your blood sugar goes below 70, or if you have a lot of readings over 200.   Please come back for a follow-up appointment in 2 months.

## 2021-09-08 ENCOUNTER — Ambulatory Visit: Payer: Medicaid Other | Admitting: Physician Assistant

## 2021-09-12 ENCOUNTER — Encounter: Payer: Self-pay | Admitting: Endocrinology

## 2021-09-14 ENCOUNTER — Encounter (INDEPENDENT_AMBULATORY_CARE_PROVIDER_SITE_OTHER): Payer: Medicaid Other | Admitting: Ophthalmology

## 2021-09-20 ENCOUNTER — Telehealth: Payer: Self-pay | Admitting: Nutrition

## 2021-09-20 NOTE — Telephone Encounter (Signed)
LVM that Dr.Ellison asked me to contact her to see if she needs help with her pump setting changes or if she needs help with using her pump-since she is only 39% in the target range.  Left number to call me back ?

## 2021-09-23 ENCOUNTER — Other Ambulatory Visit: Payer: Self-pay | Admitting: Gastroenterology

## 2021-09-28 ENCOUNTER — Encounter (INDEPENDENT_AMBULATORY_CARE_PROVIDER_SITE_OTHER): Payer: Medicaid Other | Admitting: Ophthalmology

## 2021-09-30 ENCOUNTER — Other Ambulatory Visit: Payer: Self-pay | Admitting: Gastroenterology

## 2021-10-03 ENCOUNTER — Encounter (INDEPENDENT_AMBULATORY_CARE_PROVIDER_SITE_OTHER): Payer: Medicaid Other | Admitting: Ophthalmology

## 2021-10-05 ENCOUNTER — Ambulatory Visit: Payer: Medicaid Other | Admitting: Physician Assistant

## 2021-10-05 ENCOUNTER — Telehealth: Payer: Self-pay | Admitting: *Deleted

## 2021-10-05 MED ORDER — OMEPRAZOLE 40 MG PO CPDR: DELAYED_RELEASE_CAPSULE | ORAL | 0 refills | Status: DC

## 2021-10-05 NOTE — Telephone Encounter (Signed)
Patient had an appointment with Anderson Malta today at 3:30pm. She called [place] to inform us that she has a sore throat but no fever. I advised her to be rescheduled until she is better because Strep throat is going around.  She asked if I would send her a refill of Omeprazole until her next appointment.  ?

## 2021-10-13 ENCOUNTER — Telehealth: Payer: Self-pay | Admitting: Pulmonary Disease

## 2021-10-13 ENCOUNTER — Encounter (INDEPENDENT_AMBULATORY_CARE_PROVIDER_SITE_OTHER): Payer: Medicaid Other | Admitting: Pulmonary Disease

## 2021-10-13 ENCOUNTER — Encounter: Payer: Self-pay | Admitting: Pulmonary Disease

## 2021-10-13 NOTE — Progress Notes (Signed)
Appt cancelled

## 2021-10-14 MED ORDER — ATROVENT HFA 17 MCG/ACT IN AERS: 2.0000 | INHALATION_SPRAY | RESPIRATORY_TRACT | 0 refills | Status: DC | PRN

## 2021-10-14 NOTE — Telephone Encounter (Signed)
Patient has not been seen in office since 2020. She is scheduled with me on 10/18/2021 and we can discuss inhaler options then. ? ?Unfortunately there are very few options for rescue inhalers with albuterol being the main ingredient. We can prescribe atrovent x 1 month supply which does not have the jitteriness normally associated with albuterol. ? ?Please update patient with above info and send pended atrovent to preferred pharmacy. ?

## 2021-10-14 NOTE — Telephone Encounter (Signed)
Called and spoke with pt about her medications. Pt said that albuterol inhaler was too strong and was making her very jittery. ? ?Pt wanted to know if there was a different rescue inhaler that could be prescribed. Stated to pt that she is currently on levalbuterol which is usually used as a rescue inhaler but she has been told to use that 3 times daily. ? ?When I stated to pt that we did not have albuterol inhaler on her medication list, she then said that it was the flovent inhaler that was making her feel very jittery. ? ?Pt wants to know if there can be a change to her inhalers. Dr. [name], please advise. ?

## 2021-10-14 NOTE — Telephone Encounter (Signed)
Called and spoke with patient. She verbalized understanding. Patient verified her pharmacy and atrovent has been sent in. Nothing further needed. ? ?

## 2021-10-18 ENCOUNTER — Ambulatory Visit: Payer: Medicaid Other | Admitting: Pulmonary Disease

## 2021-11-01 ENCOUNTER — Ambulatory Visit: Payer: Medicaid Other | Admitting: Physician Assistant

## 2021-11-02 ENCOUNTER — Telehealth: Payer: Self-pay | Admitting: Nurse Practitioner

## 2021-11-02 ENCOUNTER — Other Ambulatory Visit: Payer: Self-pay | Admitting: Gastroenterology

## 2021-11-02 MED ORDER — OMEPRAZOLE 40 MG PO CPDR: DELAYED_RELEASE_CAPSULE | ORAL | 1 refills | Status: DC

## 2021-11-02 NOTE — Telephone Encounter (Signed)
Refill sent to pharmacy.   

## 2021-11-03 ENCOUNTER — Ambulatory Visit (INDEPENDENT_AMBULATORY_CARE_PROVIDER_SITE_OTHER): Payer: Medicaid Other | Admitting: Pulmonary Disease

## 2021-11-03 ENCOUNTER — Encounter: Payer: Self-pay | Admitting: Pulmonary Disease

## 2021-11-03 VITALS — BP 140/70 | HR 115 | Ht 65.0 in | Wt 122.4 lb

## 2021-11-03 DIAGNOSIS — J449 Chronic obstructive pulmonary disease, unspecified: Secondary | ICD-10-CM | POA: Diagnosis not present

## 2021-11-03 MED ORDER — SPIRIVA RESPIMAT 2.5 MCG/ACT IN AERS: 2.0000 | INHALATION_SPRAY | Freq: Every day | RESPIRATORY_TRACT | 0 refills | Status: DC

## 2021-11-03 MED ORDER — ATROVENT HFA 17 MCG/ACT IN AERS: 2.0000 | INHALATION_SPRAY | RESPIRATORY_TRACT | 3 refills | Status: DC | PRN

## 2021-11-03 MED ORDER — SPIRIVA RESPIMAT 2.5 MCG/ACT IN AERS: 2.0000 | INHALATION_SPRAY | Freq: Every day | RESPIRATORY_TRACT | 5 refills | Status: DC

## 2021-11-03 NOTE — Patient Instructions (Addendum)
Asthma-COPD overlap ?--START Spiriva Respimat 2.5 mcg 2 puffs daily ?--CONTINUE Atrovent inhaler 2 puffs every 6 hours as needed for shortness of breath or wheezing ?--Cut down on smoking ? ?Follow-up with me in 2 months ?

## 2021-11-03 NOTE — Progress Notes (Signed)
? ?Synopsis: Referred in 07/26/18 for asthma ? ?Subjective:  ? ?PATIENT ID: [name] GENDER: female DOB: [birth year], MRN: [id number] ? ? ?HPI ? ?Chief Complaint  ?Patient presents with  ? Follow-up  ?  Patient feels like breathing is better since last visit.  ? ?[name] is a 46 year old female active smoker with mild persistent asthma, mild OSA, DM1 who presents for follow-up. ? ?Synopsis ?She was last seen in the office with me in 07/2018 in-person and last televisit in 02/2020 for asthma and OSA. She was unable to tolerated ICS/LABA due to reported hypertension and palpitations. Has previously reported long-standing hx of chronic productive cough with sputum that will range in color from tan to yellow-green. Associated with shortness of breath. Occasional wheezing. Worsens with smoking activity and cleaning the house. Albuterol does help. States she cannot take deep inspirations due to pain/tightness located bilaterally on her upper chest and sternum area. After having witnessed apnea she was diagnosed with OSA. ? ?11/03/21 ?Since our last visit she report her breathing is better. She uses atrovent as needed once every other day. Tolerates this well with no side effects. She completed doxycycline given to her by PCP. She is currently smokes 1.5 ppd. She has daily productive cough. Her husband is undergoing cancer work-up ? ? ?   ? View : No data to display.  ?  ?  ?  ? ? ?Social History: ?Active smoker. Started when 13 years. 2ppd x 20 years. Currently 1ppd.  ? ? ?Past Medical History:  ?Diagnosis Date  ? Anxiety state, unspecified 03/26/2009  ? ASTHMA 02/02/2007  ? Asthma   ? DEPRESSION 02/16/2009  ? DIABETES MELLITUS, TYPE I 02/02/2007  ? Endometriosis   ? GERD 12/21/2008  ? Herpes   ? at 39  ? Hyperlipidemia   ? Lesion of vocal cord   ? Osteoarthritis   ? Severe nonproliferative diabetic retinopathy of right eye, without macular edema, associated with type 1 diabetes mellitus ([place]) 11/04/2019  ?  Shortness of breath 09/25/2007  ? Vocal fold leukoplakia   ?  ? ?Family History  ?Problem Relation Age of Onset  ? Diabetes Mother   ? Diabetes Maternal Aunt   ? Emphysema Maternal Uncle   ? Diabetes Maternal Grandfather   ? Lung cancer Maternal Aunt   ? Colon cancer Neg Hx   ? Kidney disease Neg Hx   ? Liver disease Neg Hx   ? Esophageal cancer Neg Hx   ? Rectal cancer Neg Hx   ? Stomach cancer Neg Hx   ?  ? ?Social History  ? ?Occupational History  ?  Employer: UNEMPLOYED  ?Tobacco Use  ? Smoking status: Every Day  ?  Packs/day: 1.50  ?  Years: 25.00  ?  Pack years: 37.50  ?  Types: Cigarettes  ? Smokeless tobacco: Never  ? Tobacco comments:  ?  11/03/2021 -  states she smokes a pack and a half a day  ?Vaping Use  ? Vaping Use: Former  ? Quit date: 02/23/2017  ? Devices: only used tobacco   ?Substance and Sexual Activity  ? Alcohol use: Not Currently  ?  Alcohol/week: 0.0 standard drinks  ? Drug use: No  ? Sexual activity: Yes  ?  Partners: Male  ?  Birth control/protection: None  ?  Comment: 1st intercourse-14, partners- 62, current partner- 4 months  ? ? ?No Known Allergies  ? ?Outpatient Medications Prior to Visit  ?Medication Sig Dispense Refill  ?  ACCU-CHEK GUIDE test strip TEST ONCE IN THE MORNING AND ONCE AT BEDTIME. E11.9 100 strip 7  ? Accu-Chek Softclix Lancets lancets 1 each by Other route 2 (two) times daily. E11.9 180 each 2  ? amLODipine (NORVASC) 5 MG tablet Take 1 tablet (5 mg total) by mouth daily. Please make follow up appointment for future refills.TAKE 1 TABLET (5 MG TOTAL) BY MOUTH DAILY. 90 tablet 1  ? atorvastatin (LIPITOR) 10 MG tablet Take 1 tablet (10 mg total) by mouth at bedtime. 90 tablet 1  ? chlordiazePOXIDE (LIBRIUM) 25 MG capsule Take 1 capsule (25 mg total) by mouth 3 (three) times daily as needed for anxiety. 25 mg 3 times a day x 3 days, 25 mg 2 times a day for 3 days, '25mg'$  once a day for 3 days 18 capsule 0  ? Cholecalciferol (VITAMIN D3) 50 MCG (2000 UT) capsule Take by mouth  daily.     ? Continuous Blood Gluc Sensor (DEXCOM G6 SENSOR) MISC 1 each by Does not apply route See admin instructions. Change sensor every 10 days; E10.9 9 each 3  ? Continuous Blood Gluc Transmit (DEXCOM G6 TRANSMITTER) MISC Use as directed. Change every 90 days 1 each 3  ? fluticasone (FLOVENT HFA) 110 MCG/ACT inhaler INHALE 2 PUFFS INTO THE LUNGS IN THE MORNING AND AT BEDTIME. 12 each 1  ? fluticasone (FLOVENT HFA) 44 MCG/ACT inhaler Inhale 1 puff into the lungs 2 (two) times daily. 1 each 5  ? gabapentin (NEURONTIN) 300 MG capsule Take 1 capsule (300 mg total) by mouth 3 (three) times daily. As needed 270 capsule 1  ? glucagon 1 MG injection Inject 1 mg into the muscle once as needed. 1 each 12  ? Ibuprofen (ADVIL PO) Take 2-3 tablets by mouth every 6 (six) hours as needed.    ? insulin aspart (NOVOLOG) 100 UNIT/ML injection For use in pump, total of 90 units per day 90 mL 3  ? ipratropium (ATROVENT HFA) 17 MCG/ACT inhaler Inhale 2 puffs into the lungs every 4 (four) hours as needed for wheezing. 1 each 0  ? levalbuterol (XOPENEX HFA) 45 MCG/ACT inhaler Inhale 1-2 puffs into the lungs 3 (three) times daily. 1 Inhaler 3  ? lisinopril (ZESTRIL) 10 MG tablet Take 1 tablet (10 mg total) by mouth daily. 90 tablet 0  ? meclizine (ANTIVERT) 25 MG tablet Take 1 tablet (25 mg total) by mouth 3 (three) times daily as needed for dizziness. 30 tablet 0  ? Multiple Vitamin (MULTIVITAMIN WITH MINERALS) TABS tablet Take 1 tablet by mouth daily.    ? NOVOLOG 100 UNIT/ML injection INJECT 40 UNITS DAILY PER INSULIN PUMP (MAX DOSAGE) 10 mL 3  ? omeprazole (PRILOSEC) 40 MG capsule TAKE 1 CAPSULE BY MOUTH 2 TIMES DAILY.Please keep your upcoming appointment for further refills. Thank you 60 capsule 1  ? ondansetron (ZOFRAN ODT) 4 MG disintegrating tablet Take 1 tablet (4 mg total) by mouth every 8 (eight) hours as needed for nausea or vomiting. 20 tablet 0  ? oxyCODONE-acetaminophen (PERCOCET) 10-325 MG tablet SMARTSIG:1 Pill By  Mouth 1 to 4 Times Daily PRN    ? PARoxetine (PAXIL) 20 MG tablet Take 10 mg by mouth daily. Take 1/2 tablet ('10mg'$  total) by mouth once daily.    ? rOPINIRole (REQUIP) 1 MG tablet TAKE 1 TABLET BY MOUTH AT BEDTIME (PLZ SCHED APPT WITH NEW PROVIDER) ? ?Please make appointment with Dr. Rex Kras.  ? ? ? ? ? ? ? ? ? ? ? ? ? ? ? ?  Needs to make appointment with Dr. Rex Kras. 90 tablet 0  ? sulfamethoxazole-trimethoprim (BACTRIM DS) 800-160 MG tablet Take 1 tablet by mouth 2 (two) times daily.    ? valACYclovir (VALTREX) 500 MG tablet Take 1 tablet (500 mg total) by mouth 2 (two) times daily. (Patient taking differently: Take 500 mg by mouth as needed.) 10 tablet 3  ? zinc gluconate 50 MG tablet Take 50 mg by mouth daily.    ? ?No facility-administered medications prior to visit.  ? ? ?Review of Systems  ?Constitutional:  Negative for chills, diaphoresis, fever, malaise/fatigue and weight loss.  ?HENT:  Negative for congestion.   ?Respiratory:  Positive for cough. Negative for hemoptysis, sputum production, shortness of breath and wheezing.   ?Cardiovascular:  Negative for chest pain, palpitations and leg swelling.  ? ? ?Objective:  ? ?Vitals:  ? 11/03/21 1150  ?BP: 140/70  ?Pulse: (!) 115  ?SpO2: 96%  ?Weight: 122 lb 6.4 oz (55.5 kg)  ?Height: '5\' 5"'$  (1.651 m)  ? ?SpO2: 96 % ?O2 Device: None (Room air) ? ?Physical Exam: ?General: Well-appearing, no acute distress ?HENT: [place], AT ?Eyes: EOMI, no scleral icterus ?Respiratory: Clear to auscultation bilaterally.  No crackles, wheezing or rales ?Cardiovascular: RRR, -M/R/G, no JVD ?Extremities:-Edema,-tenderness ?Neuro: AAO x4, CNII-XII grossly intact ?Psych: Normal mood, normal affect ? ?Chest imaging: ?CXR 05/31/18- No infiltrate, effusion or edema ?CTA Chest Aorta 05/10/18- Apical scarring bilaterally. No evidence of RUL nodular opacity previously seen on last CXR. 1-2 cm nodular opacity in LUL present. ?CT Chest 07/31/19 - Biapical pleuroparenchymal scarring. Mild apical  centrilobular and paraseptal emphysema. Stable 2 mm LUL nodule. Basilar atelectasis/scarring ?CXR 05/11/21 - Normal ? ?PFT: ?08/12/18 ?FVC 3.28 (89%) FEV1 2.55 (85%) Ratio 70 TLC 103% RV 137% RV/TLC 134% DLCO 82%. Border

## 2021-11-07 ENCOUNTER — Encounter: Payer: Self-pay | Admitting: Pulmonary Disease

## 2021-11-09 ENCOUNTER — Other Ambulatory Visit: Payer: Self-pay

## 2021-11-09 ENCOUNTER — Ambulatory Visit (INDEPENDENT_AMBULATORY_CARE_PROVIDER_SITE_OTHER): Payer: Medicaid Other | Admitting: Endocrinology

## 2021-11-09 VITALS — BP 144/78 | HR 74 | Ht 65.0 in | Wt 123.6 lb

## 2021-11-09 DIAGNOSIS — E1142 Type 2 diabetes mellitus with diabetic polyneuropathy: Secondary | ICD-10-CM

## 2021-11-09 LAB — POCT GLYCOSYLATED HEMOGLOBIN (HGB A1C): Hemoglobin A1C: 8.6 % — AB (ref 4.0–5.6)

## 2021-11-09 MED ORDER — INSULIN ASPART 100 UNIT/ML IJ SOLN: INTRAMUSCULAR | 3 refills | Status: DC

## 2021-11-09 MED ORDER — DEXCOM G6 TRANSMITTER MISC: 3 refills | Status: DC

## 2021-11-09 MED ORDER — DEXCOM G6 SENSOR MISC: 3 refills | Status: DC

## 2021-11-09 NOTE — Patient Instructions (Addendum)
Please take these pump settings:  ?basal rate of 0.2 units/hr (when not in control-IQ function).   ?bolus of 0.5-3 units/meal, for a total of approx 5 units per day.    ?correction bolus (which some people call "sensitivity," or "insulin sensitivity ratio," or just "isr") of 1 unit for each 200 by which your glucose exceeds 150.   ?Please see Vaughan Basta, to see why the pump is not delivering the boluses.   ?check your blood sugar 4 times a day: before the 3 meals, and at bedtime.  also check if you have symptoms of your blood sugar being too high or too low.  please keep a record of the readings and bring it to your next appointment here (or you can bring the meter itself).  You can write it on any piece of paper.  please call us sooner if your blood sugar goes below 70, or if you have a lot of readings over 200.   ?Please come back for a follow-up appointment in 2 months.   ? ?

## 2021-11-09 NOTE — Progress Notes (Signed)
? ?Subjective:  ? ? Patient ID: [name], female    DOB: 12-20-1975, 46 y.o.   MRN: [id number] ? 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  ?Therapy: insulin since dx, pump rx (now tandem, with Dexcom CGM), since 2017.   ?DKA: never ?Severe hypoglycemia: never ?Pancreatitis: never ?SDOH: therapy has been limited by chronic noncompliance.   ?Other:  she has had TAH; she eats at approx 10AM and 7PM.   ?Interval history: she takes these pump settings:  ?basal rate of 0.2 units/hr.   ?bolus of 2-5 units/meal, but pt takes less than this, because pump sometimes won't deliver the bolus.     ?correction bolus (which some people call "sensitivity," or "insulin sensitivity ratio," or just "isr") of 1 unit for each 200 by which your glucose exceeds 150.  ?TDD is 12 units (49% basal, 37% meal bolus).  Control IQ in on 95% of the day.  I reviewed continuous glucose monitor data.  Glucose varies from 115-340.  It is in general highest 3PM-4PM, and less so at 7AM.  It is lowest at [facility].  It declines slightly overnight.  She again reports non-delivery of insulin boluses.  Husb was just dx'ed with stage 4 lung CA.  Pt therefore wants to d/w [employer].   ?Past Medical History:  ?Diagnosis Date  ? Anxiety state, unspecified 03/26/2009  ? ASTHMA 02/02/2007  ? Asthma   ? DEPRESSION 02/16/2009  ? DIABETES MELLITUS, TYPE I 02/02/2007  ? Endometriosis   ? GERD 12/21/2008  ? Herpes   ? at 47  ? Hyperlipidemia   ? Lesion of vocal cord   ? Osteoarthritis   ? Severe nonproliferative diabetic retinopathy of right eye, without macular edema, associated with type 1 diabetes mellitus ([place]) 11/04/2019  ? Shortness of breath 09/25/2007  ? Vocal fold leukoplakia   ? ? ?Past Surgical History:  ?Procedure Laterality Date  ? ABDOMINAL HYSTERECTOMY  2009  ? BSO  ? DIRECT LARYNGOSCOPY N/A 08/17/2017  ? Procedure: DIRECT LARYNGOSCOPY WITH OPERATING TELESCOPE WITH BIOPSY;  Surgeon: Helayne Seminole, MD;  Location: [place];  Service: ENT;  Laterality: N/A;  ? NASOPHARYNGEAL BIOPSY Right 08/17/2017  ? Procedure: NASOPHARYNGEAL BIOPSY RIGHT NASAL LESION;  Surgeon: Helayne Seminole, MD;  Location: [place];  Service: ENT;  Laterality: Right;  ? ? ?Social History  ? ?Socioeconomic History  ? Marital status: Legally Separated  ?  Spouse name: Not on file  ? Number of children: 2  ? Years of education: Not on file  ? Highest education level: Not on file  ?Occupational History  ?  Employer: UNEMPLOYED  ?Tobacco Use  ? Smoking status: Every Day  ?  Packs/day: 1.50  ?  Years: 25.00  ?  Pack years: 37.50  ?  Types: Cigarettes  ? Smokeless tobacco: Never  ? Tobacco comments:  ?  11/03/2021 -  states she smokes a pack and a half a day  ?Vaping Use  ? Vaping Use: Former  ? Quit date: 02/23/2017  ? Devices: only used tobacco   ?Substance and Sexual Activity  ? Alcohol use: Not Currently  ?  Alcohol/week: 0.0 standard drinks  ? Drug use: No  ? Sexual activity: Yes  ?  Partners: Male  ?  Birth control/protection: None  ?  Comment: 1st intercourse-14, partners- 42, current partner- 4 months  ?Other Topics Concern  ? Not on file  ?Social History  Narrative  ? 2 children--pt has custody. Husband pays child support.  ? Daily Caffeine Use-3 2 liters a day  ? Pt does not get regular exercise.  ? ?Social Determinants of Health  ? ?Financial Resource Strain: Not on file  ?Food Insecurity: Not on file  ?Transportation Needs: Not on file  ?Physical Activity: Not on file  ?Stress: Not on file  ?Social Connections: Not on file  ?Intimate Partner Violence: Not on file  ? ? ?Current Outpatient Medications on File Prior to Visit  ?Medication Sig Dispense Refill  ? ACCU-CHEK GUIDE test strip TEST ONCE IN THE MORNING AND ONCE AT BEDTIME. E11.9 100 strip 7  ? Accu-Chek Softclix Lancets lancets 1 each by Other route 2 (two) times daily. E11.9 180 each 2  ? amLODipine (NORVASC) 5 MG tablet Take 1 tablet (5 mg total) by mouth daily. Please make follow  up appointment for future refills.TAKE 1 TABLET (5 MG TOTAL) BY MOUTH DAILY. 90 tablet 1  ? atorvastatin (LIPITOR) 10 MG tablet Take 1 tablet (10 mg total) by mouth at bedtime. 90 tablet 1  ? chlordiazePOXIDE (LIBRIUM) 25 MG capsule Take 1 capsule (25 mg total) by mouth 3 (three) times daily as needed for anxiety. 25 mg 3 times a day x 3 days, 25 mg 2 times a day for 3 days, '25mg'$  once a day for 3 days 18 capsule 0  ? Cholecalciferol (VITAMIN D3) 50 MCG (2000 UT) capsule Take by mouth daily.     ? Continuous Blood Gluc Sensor (DEXCOM G6 SENSOR) MISC 1 each by Does not apply route See admin instructions. Change sensor every 10 days; E10.9 9 each 3  ? Continuous Blood Gluc Transmit (DEXCOM G6 TRANSMITTER) MISC Use as directed. Change every 90 days 1 each 3  ? gabapentin (NEURONTIN) 300 MG capsule Take 1 capsule (300 mg total) by mouth 3 (three) times daily. As needed 270 capsule 1  ? glucagon 1 MG injection Inject 1 mg into the muscle once as needed. 1 each 12  ? Ibuprofen (ADVIL PO) Take 2-3 tablets by mouth every 6 (six) hours as needed.    ? ipratropium (ATROVENT HFA) 17 MCG/ACT inhaler Inhale 2 puffs into the lungs every 4 (four) hours as needed for wheezing. 1 each 3  ? lisinopril (ZESTRIL) 10 MG tablet Take 1 tablet (10 mg total) by mouth daily. 90 tablet 0  ? meclizine (ANTIVERT) 25 MG tablet Take 1 tablet (25 mg total) by mouth 3 (three) times daily as needed for dizziness. 30 tablet 0  ? Multiple Vitamin (MULTIVITAMIN WITH MINERALS) TABS tablet Take 1 tablet by mouth daily.    ? NOVOLOG 100 UNIT/ML injection INJECT 40 UNITS DAILY PER INSULIN PUMP (MAX DOSAGE) 10 mL 3  ? omeprazole (PRILOSEC) 40 MG capsule TAKE 1 CAPSULE BY MOUTH 2 TIMES DAILY.Please keep your upcoming appointment for further refills. Thank you 60 capsule 1  ? ondansetron (ZOFRAN ODT) 4 MG disintegrating tablet Take 1 tablet (4 mg total) by mouth every 8 (eight) hours as needed for nausea or vomiting. 20 tablet 0  ? oxyCODONE-acetaminophen  (PERCOCET) 10-325 MG tablet SMARTSIG:1 Pill By Mouth 1 to 4 Times Daily PRN    ? PARoxetine (PAXIL) 20 MG tablet Take 10 mg by mouth daily. Take 1/2 tablet ('10mg'$  total) by mouth once daily.    ? rOPINIRole (REQUIP) 1 MG tablet TAKE 1 TABLET BY MOUTH AT BEDTIME (PLZ SCHED APPT WITH NEW PROVIDER) ? ?Please make appointment with Dr. Rex Kras.  ? ? ? ? ? ? ? ? ? ? ? ? ? ? ? ?  Needs to make appointment with Dr. Rex Kras. 90 tablet 0  ? sulfamethoxazole-trimethoprim (BACTRIM DS) 800-160 MG tablet Take 1 tablet by mouth 2 (two) times daily.    ? Tiotropium Bromide Monohydrate (SPIRIVA RESPIMAT) 2.5 MCG/ACT AERS Inhale 2 puffs into the lungs daily. 4 g 5  ? Tiotropium Bromide Monohydrate (SPIRIVA RESPIMAT) 2.5 MCG/ACT AERS Inhale 2 puffs into the lungs daily. 4 g 0  ? valACYclovir (VALTREX) 500 MG tablet Take 1 tablet (500 mg total) by mouth 2 (two) times daily. (Patient taking differently: Take 500 mg by mouth as needed.) 10 tablet 3  ? zinc gluconate 50 MG tablet Take 50 mg by mouth daily.    ? ?No current facility-administered medications on file prior to visit.  ? ? ?No Known Allergies ? ?Family History  ?Problem Relation Age of Onset  ? Diabetes Mother   ? Diabetes Maternal Aunt   ? Emphysema Maternal Uncle   ? Diabetes Maternal Grandfather   ? Lung cancer Maternal Aunt   ? Colon cancer Neg Hx   ? Kidney disease Neg Hx   ? Liver disease Neg Hx   ? Esophageal cancer Neg Hx   ? Rectal cancer Neg Hx   ? Stomach cancer Neg Hx   ? ? ?BP (!) 144/78 (BP Location: Left Arm, Patient Position: Sitting, Cuff Size: Normal)   Pulse 74   Ht '5\' 5"'$  (1.651 m)   Wt 123 lb 9.6 oz (56.1 kg)   SpO2 95%   BMI 20.57 kg/m?  ? ?Review of Systems ? ?   ?Objective:  ? Physical Exam ?VITAL SIGNS:  See vs page ?GENERAL: no distress.  ? ? ? ?Lab Results  ?Component Value Date  ? HGBA1C 8.6 (A) 11/09/2021  ? ?   ?Assessment & Plan:  ?Type 1 DM: uncontrolled ?Device malfunction: I have referred to Leonia Reader, CDE. ? ?Patient Instructions  ?Please  take these pump settings:  ?basal rate of 0.2 units/hr (when not in control-IQ function).   ?bolus of 0.5-3 units/meal, for a total of approx 5 units per day.    ?correction bolus (which some people call "sen

## 2021-11-30 ENCOUNTER — Encounter: Payer: Self-pay | Admitting: Nurse Practitioner

## 2021-11-30 ENCOUNTER — Ambulatory Visit (INDEPENDENT_AMBULATORY_CARE_PROVIDER_SITE_OTHER): Payer: Medicaid Other | Admitting: Nurse Practitioner

## 2021-11-30 VITALS — BP 120/60 | HR 101 | Ht 65.0 in | Wt 121.0 lb

## 2021-11-30 DIAGNOSIS — K219 Gastro-esophageal reflux disease without esophagitis: Secondary | ICD-10-CM | POA: Diagnosis not present

## 2021-11-30 MED ORDER — OMEPRAZOLE 40 MG PO CPDR
DELAYED_RELEASE_CAPSULE | ORAL | 2 refills | Status: DC
Start: 2021-11-30 — End: 2022-05-03

## 2021-11-30 NOTE — Patient Instructions (Addendum)
Acid Reflux ?--If you are taking anti-reflux ( GERD) medication be sure to take it 30 minutes before breakfast and if taking twice daily then also 30 minutes before dinner.   ?--Avoid late meals / bedtime snacks.   ?--Avoid trigger foods ( foods which you know tend to aggravate you reflux symptoms). Some of the more common triggers include spicy foods, fatty foods, acidic foods, chocolate and caffeine.  ?--Elevate the head of bed 6-8 inches on blocks or bricks. If not able to elevate the head of the bed consider purchasing a wedge pillow to sleep on.    ?---Sometimes with the above mentioned "lifestyle changes" patients are able to reduce the amount of GERD medications they take. Our goal would be to have you on the lowest effective dose of medication ? ?Call us in a couple of months to get scheduled for a screening colonoscopy. ? ?We have sent the following medications to your pharmacy for you to pick up at your convenience: ?Omeprazole  ? ?

## 2021-11-30 NOTE — Progress Notes (Signed)
? ? ?Assessment  ? ?Patient Profile:  ?[name] is a 46 y.o. female known to Dr. Havery Moros with a past medical history of GERD, hiatal hernia, DM, asthma.  Additional medical history as listed in [place] . ? ?GERD without Barrett's. Needs refill on Omeprazole ?Symptoms controlled with BID PPI ?Creatinine 0.54, GFR > 60 ? ?Colon cancer screening.  ?We talked about a screening colonoscopy. She is 45. Her last colonoscopy was in 2009. She has no alarm symptoms. Right now is not a good time for her. Her husband is undergoing chemotherapy. She will call us in a couple of months to get scheduled.  ? ? ?Plan  ?Discussed anti-reflux measures such as avoidance of late meals / bedtime snacks, HOB elevation (or use of wedge pillow), weight reduction ( if applicable) and avoidance of trigger foods and caffeine.  ?We discussed trying at some point to de-escalate therapy as she asks about the risks of long term BID PPI. Will refill 90 days of BID omeprazole. In the meantime she may decide to get up some Pepcid 20 mg OTC and try taking that before dinner in place of Omeprazole to see if that regimen works for her.  ? ?HPI  ? ?Chief Complaint : Needs refill on reflux medication ? ?Ivee had a history of abdominal bloating, diarrhea, reflux in the past.  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 Treated empirically with rifaximin in the past. ? ?Ivana was last seen in March 2021 for atypical chest pain / dysphagia. Given Carafate to help with nocturnal symptoms. Scheduled for EGD which showed a moderate size HH. No esophageal strictures. Biopsies c/w with reflux. Some residual food in stomach.  ? ?Interval History:  ?[name] is here for a refill. She is doing well on BID PPI. She is unable to reduce PPI to once daily or she will get nocturnal symptoms. At the same time she expresses concern about being on BID PPI long term. Dr Havery Moros had previously discussed the risks and benefits of long term treatment.  ? ?We  talked about a screening colonoscopy. She is 45. Her last colonoscopy was in 2009. She has no alarm symptoms. Right now is not a good time for her. Her husband is undergoing chemotherapy ? ? ?Previous GI Evaluation  ?Most recent studies ?10/10/19 EGD ?- Esophagogastric landmarks identified. ?- 4 cm hiatal hernia. ?- Normal esophagus.No obvious stenosis / stricture, empiric dilation not performed given some ?small residual food in the stomach. Biopsies taken to rule out EoE. ?- A small amount of food (residue) in the stomach. ?- Normal stomach otherwise ?- Normal duodenal bulb and second portion of the duodenum. ?- Findings concerning for possible gastroparesis.  ? ?Diagnosis ?Surgical [P], random esophageal sites ?- ESOPHAGEAL SQUAMOUS MUCOSA WITH MILD VASCULAR CONGESTION, AND FOCAL SQUAMOUS BALLOONING, ?SUGGESTIVE OF REFLUX ESOPHAGITIS ?- NEGATIVE FOR INCREASED INTRAEPITHELIAL EOSINOPHILS ? ? ? ?Labs:  ? ?  Latest Ref Rng & Units 05/11/2021  ?  2:58 PM 05/03/2021  ?  9:57 PM 09/18/2018  ? 11:23 AM  ?CBC  ?WBC 4.0 - 10.5 K/uL 11.2   12.1   5.9    ?Hemoglobin 12.0 - 15.0 g/dL 15.1   15.2   14.9    ?Hematocrit 36.0 - 46.0 % 42.6   41.4   43.4    ?Platelets 150 - 400 K/uL 245   263   202.0    ? ? ? ?  Latest Ref Rng & Units 05/03/2021  ?  9:57 PM 11/29/2017  ?  11:22 AM 06/19/2017  ?  2:18 PM  ?Hepatic Function  ?Total Protein 6.5 - 8.1 g/dL 8.1   6.6   6.9    ?Albumin 3.5 - 5.0 g/dL 4.9    4.5    ?AST 15 - 41 U/L 14    13    ?ALT 0 - 44 U/L 14    13    ?Alk Phosphatase 38 - 126 U/L 82    69    ?Total Bilirubin 0.3 - 1.2 mg/dL 0.8    0.3    ?Bilirubin, Direct 0.0 - 0.2 mg/dL 0.2      ? ? ? ?Past Medical History:  ?Diagnosis Date  ? Anxiety state, unspecified 03/26/2009  ? ASTHMA 02/02/2007  ? Asthma   ? DEPRESSION 02/16/2009  ? DIABETES MELLITUS, TYPE I 02/02/2007  ? Endometriosis   ? GERD 12/21/2008  ? Herpes   ? at 37  ? Hyperlipidemia   ? Lesion of vocal cord   ? Osteoarthritis   ? Severe nonproliferative diabetic retinopathy of  right eye, without macular edema, associated with type 1 diabetes mellitus ([place]) 11/04/2019  ? Shortness of breath 09/25/2007  ? Vocal fold leukoplakia   ? ? ?Past Surgical History:  ?Procedure Laterality Date  ? ABDOMINAL HYSTERECTOMY  2009  ? BSO  ? DIRECT LARYNGOSCOPY N/A 08/17/2017  ? Procedure: DIRECT LARYNGOSCOPY WITH OPERATING TELESCOPE WITH BIOPSY;  Surgeon: Helayne Seminole, MD;  Location: [facility];  Service: ENT;  Laterality: N/A;  ? NASOPHARYNGEAL BIOPSY Right 08/17/2017  ? Procedure: NASOPHARYNGEAL BIOPSY RIGHT NASAL LESION;  Surgeon: Helayne Seminole, MD;  Location: [facility];  Service: ENT;  Laterality: Right;  ? ? ?Current Medications, Allergies, Family History and Social History were reviewed in [employer] record. ?  ?  ?Current Outpatient Medications  ?Medication Sig Dispense Refill  ? Continuous Blood Gluc Sensor (DEXCOM G6 SENSOR) MISC Use as instructed to check blood sugar, change every 10 days 9 each 3  ? Continuous Blood Gluc Transmit (DEXCOM G6 TRANSMITTER) MISC Use as instructed to check blood sugars, change every 3 months. 1 each 3  ? ACCU-CHEK GUIDE test strip TEST ONCE IN THE MORNING AND ONCE AT BEDTIME. E11.9 100 strip 7  ? Accu-Chek Softclix Lancets lancets 1 each by Other route 2 (two) times daily. E11.9 180 each 2  ? amLODipine (NORVASC) 5 MG tablet Take 1 tablet (5 mg total) by mouth daily. Please make follow up appointment for future refills.TAKE 1 TABLET (5 MG TOTAL) BY MOUTH DAILY. 90 tablet 1  ? atorvastatin (LIPITOR) 10 MG tablet Take 1 tablet (10 mg total) by mouth at bedtime. 90 tablet 1  ? chlordiazePOXIDE (LIBRIUM) 25 MG capsule Take 1 capsule (25 mg total) by mouth 3 (three) times daily as needed for anxiety. 25 mg 3 times a day x 3 days, 25 mg 2 times a day for 3 days, 20m once a day for 3 days 18 capsule 0  ? Cholecalciferol (VITAMIN D3) 50 MCG (2000 UT) capsule Take by mouth daily.     ? Continuous Blood Gluc Sensor (DEXCOM G6 SENSOR) MISC 1 each by  Does not apply route See admin instructions. Change sensor every 10 days; E10.9 9 each 3  ? Continuous Blood Gluc Transmit (DEXCOM G6 TRANSMITTER) MISC Use as directed. Change every 90 days 1 each 3  ? gabapentin (NEURONTIN) 300 MG capsule Take 1 capsule (300 mg total) by mouth 3 (three) times daily. As needed 270 capsule 1  ?  glucagon 1 MG injection Inject 1 mg into the muscle once as needed. 1 each 12  ? Ibuprofen (ADVIL PO) Take 2-3 tablets by mouth every 6 (six) hours as needed.    ? insulin aspart (NOVOLOG) 100 UNIT/ML injection For use in pump, total of 90 units per day 90 mL 3  ? ipratropium (ATROVENT HFA) 17 MCG/ACT inhaler Inhale 2 puffs into the lungs every 4 (four) hours as needed for wheezing. 1 each 3  ? lisinopril (ZESTRIL) 10 MG tablet Take 1 tablet (10 mg total) by mouth daily. 90 tablet 0  ? meclizine (ANTIVERT) 25 MG tablet Take 1 tablet (25 mg total) by mouth 3 (three) times daily as needed for dizziness. 30 tablet 0  ? Multiple Vitamin (MULTIVITAMIN WITH MINERALS) TABS tablet Take 1 tablet by mouth daily.    ? NOVOLOG 100 UNIT/ML injection INJECT 40 UNITS DAILY PER INSULIN PUMP (MAX DOSAGE) 10 mL 3  ? omeprazole (PRILOSEC) 40 MG capsule TAKE 1 CAPSULE BY MOUTH 2 TIMES DAILY.Please keep your upcoming appointment for further refills. Thank you 60 capsule 1  ? ondansetron (ZOFRAN ODT) 4 MG disintegrating tablet Take 1 tablet (4 mg total) by mouth every 8 (eight) hours as needed for nausea or vomiting. 20 tablet 0  ? oxyCODONE-acetaminophen (PERCOCET) 10-325 MG tablet SMARTSIG:1 Pill By Mouth 1 to 4 Times Daily PRN    ? PARoxetine (PAXIL) 20 MG tablet Take 10 mg by mouth daily. Take 1/2 tablet (85m total) by mouth once daily.    ? rOPINIRole (REQUIP) 1 MG tablet TAKE 1 TABLET BY MOUTH AT BEDTIME (PLZ SCHED APPT WITH NEW PROVIDER) ? ?Please make appointment with Dr. LRex Kras  ? ? ? ? ? ? ? ? ? ? ? ? ? ? ? ?Needs to make appointment with Dr. LRex Kras 90 tablet 0  ? sulfamethoxazole-trimethoprim (BACTRIM  DS) 800-160 MG tablet Take 1 tablet by mouth 2 (two) times daily.    ? Tiotropium Bromide Monohydrate (SPIRIVA RESPIMAT) 2.5 MCG/ACT AERS Inhale 2 puffs into the lungs daily. 4 g 5  ? Tiotropium Bromide Mo

## 2021-12-01 ENCOUNTER — Ambulatory Visit: Payer: Medicaid Other | Admitting: Nurse Practitioner

## 2021-12-01 ENCOUNTER — Telehealth: Payer: Self-pay | Admitting: Internal Medicine

## 2021-12-01 NOTE — Telephone Encounter (Signed)
Patient requesting her appointment tomorrow be virtual, because her husband has chemo.

## 2021-12-01 NOTE — Telephone Encounter (Signed)
Spoke to patient advised Dr.Acharya's RN is out of office this afternoon.I will send message to her.

## 2021-12-01 NOTE — Progress Notes (Signed)
Agree with assessment and plan as outlined.  

## 2021-12-02 ENCOUNTER — Telehealth: Payer: Medicaid Other | Admitting: Internal Medicine

## 2021-12-02 NOTE — Telephone Encounter (Signed)
Attempted to call patient, unable to reach. Left message stating that appointment has been changed to a MyChart video visit, and advised that since Dr. Margaretann Loveless is seeing patients in clinic there may be a bit of a wait. Advised in message to call back if any issues arise.

## 2021-12-08 ENCOUNTER — Ambulatory Visit: Payer: Medicaid Other | Admitting: Dietician

## 2021-12-19 NOTE — Progress Notes (Deleted)
Cardiology Office Note:    Date:  12/19/2021   ID:  [name], DOB 27-Mar-1976, MRN [id number]  PCP:  Tamsen Roers, MD  Cardiologist:  Elouise Munroe, MD  Electrophysiologist:  None   Referring MD: Tamsen Roers, MD   Chief Complaint: follow-up of hypertension  History of Present Illness:    [name] is a 46 y.o. female with a history of hypertensin, hyperlipidemia, type 1 diabetes mellitus, asthma followed by Pulmonology, GERD, depression, and endometriosis who is followed by Dr. Margaretann Loveless and presents today for follow-up of hypertension.  Patient was first referred to Cardiology in 05/2018 after an ED visit for chest pain. Myoview as ordered at that time and was low risk with no evidence of ischemia. We have attempted to perform a coronary CTA given continued atypical chest pain; however, her heart rate has remained to high to get a quality study. Coronary calcium score alone was performed in 08/2019 and was 0 placing at low risk for an acute coronary events in the next several years.   Patient was last seen by Dr. Margaretann Loveless in 02/2020 at which time her BP was at goal after starting Amlodipine but her heart rate was still elevated. She continued to have significant anxiety and stress and would have concomitant chest discomfort and shortness of breath. Dr. Margaretann Loveless discussed that anxiety and smoking are likely both drivers for her tachycardia.   Patient presents today for follow-up. ***  Atypical Chest Pain History of atypical chest pain. Myoview in 05/2018 showed no evidence of ischemia. Coronary CTA was attempted in 08/2019 but we were unable to get her heart rate down appropriately. Coronary calcium score alone was performed and was 0. - *** - Likely related to significant anxiety and stress. ***  Hypertension BP well controlled. - Continue current medications: Amlodipine '5mg'$  daily and Lisinopril '10mg'$  daily.   Hyperlipidemia Most recent lipid panel in ***: -  Continue Lipitor '10mg'$  daily. - Labs followed by PCP. ***  Type 1 Diabetes Mellitus - Management per PCP/ Endocrinology.   Past Medical History:  Diagnosis Date   Anxiety state, unspecified 03/26/2009   ASTHMA 02/02/2007   Asthma    DEPRESSION 02/16/2009   DIABETES MELLITUS, TYPE I 02/02/2007   Endometriosis    GERD 12/21/2008   Herpes    at 18   Hyperlipidemia    Lesion of vocal cord    Osteoarthritis    Severe nonproliferative diabetic retinopathy of right eye, without macular edema, associated with type 1 diabetes mellitus ([place]) 11/04/2019   Shortness of breath 09/25/2007   Vocal fold leukoplakia     Past Surgical History:  Procedure Laterality Date   ABDOMINAL HYSTERECTOMY  2009   BSO   DIRECT LARYNGOSCOPY N/A 08/17/2017   Procedure: DIRECT LARYNGOSCOPY WITH OPERATING TELESCOPE WITH BIOPSY;  Surgeon: Helayne Seminole, MD;  Location: [facility];  Service: ENT;  Laterality: N/A;   NASOPHARYNGEAL BIOPSY Right 08/17/2017   Procedure: NASOPHARYNGEAL BIOPSY RIGHT NASAL LESION;  Surgeon: Helayne Seminole, MD;  Location: [facility];  Service: ENT;  Laterality: Right;    Current Medications: No outpatient medications have been marked as taking for the 12/27/21 encounter (Appointment) with Darreld Mclean, PA-C.     Allergies:   Patient has no known allergies.   Social History   Socioeconomic History   Marital status: Legally Separated    Spouse name: Not on file   Number of children: 2   Years of education: Not on file  Highest education level: Not on file  Occupational History    Employer: UNEMPLOYED  Tobacco Use   Smoking status: Every Day    Packs/day: 1.50    Years: 25.00    Pack years: 37.50    Types: Cigarettes   Smokeless tobacco: Never   Tobacco comments:    11/03/2021 -  states she smokes a pack and a half a day  Vaping Use   Vaping Use: Former   Quit date: 02/23/2017   Devices: only used tobacco   Substance and Sexual Activity   Alcohol use: Not Currently     Alcohol/week: 0.0 standard drinks   Drug use: No   Sexual activity: Yes    Partners: Male    Birth control/protection: None    Comment: 1st intercourse-14, partners- 55, current partner- 4 months  Other Topics Concern   Not on file  Social History Narrative   2 children--pt has custody. Husband pays child support.   Daily Caffeine Use-3 2 liters a day   Pt does not get regular exercise.   Social Determinants of Health   Financial Resource Strain: Not on file  Food Insecurity: Not on file  Transportation Needs: Not on file  Physical Activity: Not on file  Stress: Not on file  Social Connections: Not on file     Family History: The patient's family history includes Diabetes in her maternal aunt, maternal grandfather, and mother; Emphysema in her maternal uncle; Lung cancer in her maternal aunt. There is no history of Colon cancer, Kidney disease, Liver disease, Esophageal cancer, Rectal cancer, or Stomach cancer.  ROS:   Please see the history of present illness.     EKGs/Labs/Other Studies Reviewed:    The following studies were reviewed today:  Myoview 05/27/2018: Nuclear stress EF: 70%. Blood pressure demonstrated a normal response to exercise. No T wave inversion was noted during stress. There was no ST segment deviation noted during stress. This is a low risk study.   Normal perfusion. LVEF 70% with normal wall motion. This is a low risk study. _______________  CT Cardiac Scoring 09/11/2019: Impressions: - Coronary calcium score of 0. - Patient is low risk for near term coronary events  EKG:  EKG not ordered today.  Recent Labs: 05/03/2021: ALT 14 05/11/2021: BUN 6; Creatinine, Ser 0.54; Hemoglobin 15.1; Platelets 245; Potassium 3.7; Sodium 130  Recent Lipid Panel    Component Value Date/Time   CHOL 220 (H) 05/12/2010 1152   TRIG 200.0 (H) 05/12/2010 1152   HDL 49.90 05/12/2010 1152   CHOLHDL 4 05/12/2010 1152   VLDL 40.0 05/12/2010 1152   LDLDIRECT  142.7 05/12/2010 1152    Physical Exam:    Vital Signs: There were no vitals taken for this visit.    Wt Readings from Last 3 Encounters:  11/30/21 121 lb (54.9 kg)  11/09/21 123 lb 9.6 oz (56.1 kg)  11/03/21 122 lb 6.4 oz (55.5 kg)     General: 46 y.o. female in no acute distress. HEENT: Normocephalic and atraumatic. Sclera clear. EOMs intact. Neck: Supple. No carotid bruits. No JVD. Heart: *** RRR. Distinct S1 and S2. No murmurs, gallops, or rubs. Radial and distal pedal pulses 2+ and equal bilaterally. Lungs: No increased work of breathing. Clear to ausculation bilaterally. No wheezes, rhonchi, or rales.  Abdomen: Soft, non-distended, and non-tender to palpation. Bowel sounds present in all 4 quadrants.  MSK: Normal strength and tone for age. *** Extremities: No lower extremity edema.    Skin: Warm and  dry. Neuro: Alert and oriented x3. No focal deficits. Psych: Normal affect. Responds appropriately.   Assessment:    No diagnosis found.  Plan:     Disposition: Follow up in ***   Medication Adjustments/Labs and Tests Ordered: Current medicines are reviewed at length with the patient today.  Concerns regarding medicines are outlined above.  No orders of the defined types were placed in this encounter.  No orders of the defined types were placed in this encounter.   There are no Patient Instructions on file for this visit.   Signed, Darreld Mclean, PA-C  12/19/2021 10:32 AM    [facility]

## 2021-12-23 ENCOUNTER — Encounter: Payer: Self-pay | Admitting: Endocrinology

## 2021-12-27 ENCOUNTER — Ambulatory Visit: Payer: Medicaid Other | Admitting: Student

## 2022-01-04 ENCOUNTER — Encounter: Payer: Self-pay | Admitting: Student

## 2022-01-09 ENCOUNTER — Ambulatory Visit: Payer: Medicaid Other | Admitting: Pulmonary Disease

## 2022-01-16 ENCOUNTER — Ambulatory Visit: Payer: Medicaid Other | Admitting: Endocrinology

## 2022-01-31 ENCOUNTER — Ambulatory Visit: Payer: Medicaid Other | Admitting: Endocrinology

## 2022-03-07 ENCOUNTER — Other Ambulatory Visit: Payer: Self-pay

## 2022-03-07 DIAGNOSIS — E1142 Type 2 diabetes mellitus with diabetic polyneuropathy: Secondary | ICD-10-CM

## 2022-03-07 MED ORDER — DEXCOM G6 SENSOR MISC: 1.0000 | 3 refills | Status: DC

## 2022-03-08 ENCOUNTER — Other Ambulatory Visit: Payer: Self-pay

## 2022-03-08 DIAGNOSIS — E1142 Type 2 diabetes mellitus with diabetic polyneuropathy: Secondary | ICD-10-CM

## 2022-03-08 MED ORDER — DEXCOM G6 TRANSMITTER MISC: 3 refills | Status: DC

## 2022-03-13 ENCOUNTER — Encounter: Payer: Self-pay | Admitting: Emergency Medicine

## 2022-03-13 DIAGNOSIS — Z801 Family history of malignant neoplasm of trachea, bronchus and lung: Secondary | ICD-10-CM

## 2022-03-13 DIAGNOSIS — Z794 Long term (current) use of insulin: Secondary | ICD-10-CM

## 2022-03-13 DIAGNOSIS — Z825 Family history of asthma and other chronic lower respiratory diseases: Secondary | ICD-10-CM

## 2022-03-13 DIAGNOSIS — F17210 Nicotine dependence, cigarettes, uncomplicated: Secondary | ICD-10-CM | POA: Diagnosis present

## 2022-03-13 DIAGNOSIS — F419 Anxiety disorder, unspecified: Secondary | ICD-10-CM | POA: Diagnosis present

## 2022-03-13 DIAGNOSIS — J45909 Unspecified asthma, uncomplicated: Secondary | ICD-10-CM | POA: Diagnosis present

## 2022-03-13 DIAGNOSIS — K219 Gastro-esophageal reflux disease without esophagitis: Secondary | ICD-10-CM | POA: Diagnosis present

## 2022-03-13 DIAGNOSIS — R000 Tachycardia, unspecified: Secondary | ICD-10-CM | POA: Diagnosis present

## 2022-03-13 DIAGNOSIS — Z833 Family history of diabetes mellitus: Secondary | ICD-10-CM

## 2022-03-13 DIAGNOSIS — E103491 Type 1 diabetes mellitus with severe nonproliferative diabetic retinopathy without macular edema, right eye: Secondary | ICD-10-CM | POA: Diagnosis present

## 2022-03-13 DIAGNOSIS — Z90710 Acquired absence of both cervix and uterus: Secondary | ICD-10-CM

## 2022-03-13 DIAGNOSIS — E1042 Type 1 diabetes mellitus with diabetic polyneuropathy: Secondary | ICD-10-CM | POA: Diagnosis present

## 2022-03-13 DIAGNOSIS — I119 Hypertensive heart disease without heart failure: Secondary | ICD-10-CM | POA: Diagnosis present

## 2022-03-13 DIAGNOSIS — K529 Noninfective gastroenteritis and colitis, unspecified: Secondary | ICD-10-CM | POA: Diagnosis present

## 2022-03-13 DIAGNOSIS — E871 Hypo-osmolality and hyponatremia: Principal | ICD-10-CM | POA: Diagnosis present

## 2022-03-13 DIAGNOSIS — E1065 Type 1 diabetes mellitus with hyperglycemia: Secondary | ICD-10-CM | POA: Diagnosis present

## 2022-03-13 DIAGNOSIS — E876 Hypokalemia: Secondary | ICD-10-CM | POA: Diagnosis present

## 2022-03-13 DIAGNOSIS — M1990 Unspecified osteoarthritis, unspecified site: Secondary | ICD-10-CM | POA: Diagnosis present

## 2022-03-13 DIAGNOSIS — E785 Hyperlipidemia, unspecified: Secondary | ICD-10-CM | POA: Diagnosis present

## 2022-03-13 DIAGNOSIS — F32A Depression, unspecified: Secondary | ICD-10-CM | POA: Diagnosis present

## 2022-03-13 LAB — COMPREHENSIVE METABOLIC PANEL
ALT: 11 U/L (ref 0–44)
AST: 16 U/L (ref 15–41)
Albumin: 4.8 g/dL (ref 3.5–5.0)
Alkaline Phosphatase: 87 U/L (ref 38–126)
Anion gap: 17 — ABNORMAL HIGH (ref 5–15)
BUN: 9 mg/dL (ref 6–20)
CO2: 20 mmol/L — ABNORMAL LOW (ref 22–32)
Calcium: 9.9 mg/dL (ref 8.9–10.3)
Chloride: 83 mmol/L — ABNORMAL LOW (ref 98–111)
Creatinine, Ser: 0.77 mg/dL (ref 0.44–1.00)
GFR, Estimated: >60 mL/min (ref >60)
Glucose, Bld: 215 mg/dL — ABNORMAL HIGH (ref 70–99)
Potassium: 3.9 mmol/L (ref 3.5–5.1)
Sodium: 120 mmol/L — ABNORMAL LOW (ref 135–145)
Total Bilirubin: 1.2 mg/dL (ref 0.3–1.2)
Total Protein: 8.7 g/dL — ABNORMAL HIGH (ref 6.5–8.1)

## 2022-03-13 LAB — CBC
HCT: 45.7 % (ref 36.0–46.0)
Hemoglobin: 16.3 g/dL — ABNORMAL HIGH (ref 12.0–15.0)
MCH: 30.2 pg (ref 26.0–34.0)
MCHC: 35.7 g/dL (ref 30.0–36.0)
MCV: 84.6 fL (ref 80.0–100.0)
Platelets: 341 10*3/uL (ref 150–400)
RBC: 5.4 MIL/uL — ABNORMAL HIGH (ref 3.87–5.11)
RDW: 11.8 % (ref 11.5–15.5)
WBC: 12.7 10*3/uL — ABNORMAL HIGH (ref 4.0–10.5)
nRBC: 0 % (ref 0.0–0.2)

## 2022-03-13 LAB — CBG MONITORING, ED: Glucose-Capillary: 199 mg/dL — ABNORMAL HIGH (ref 70–99)

## 2022-03-13 LAB — TROPONIN I (HIGH SENSITIVITY): Troponin I (High Sensitivity): 6 ng/L (ref <18)

## 2022-03-13 LAB — LIPASE, BLOOD: Lipase: 24 U/L (ref 11–51)

## 2022-03-13 MED ORDER — LACTATED RINGERS IV BOLUS
1000.0000 mL | Freq: Once | INTRAVENOUS | Status: AC
  Administered 2022-03-13: 1000 mL via INTRAVENOUS

## 2022-03-13 MED ORDER — ONDANSETRON 4 MG PO TBDP: 4.0000 mg | ORAL_TABLET | Freq: Once | ORAL | Status: DC | PRN

## 2022-03-13 MED ORDER — ONDANSETRON HCL 4 MG/2ML IJ SOLN
4.0000 mg | Freq: Once | INTRAMUSCULAR | Status: AC
  Administered 2022-03-13: 4 mg via INTRAVENOUS
  Filled 2022-03-13: qty 2

## 2022-03-13 NOTE — ED Triage Notes (Signed)
Pt presents via POV with complaints of emesis for the last 2-3 days. Pt is a T2DM with a range of 200s over the last few days. She notes not eating very much over the last few days due to the nausea. Denies abdominal pain, dizziness,  CP or SOB.    CBG - 199

## 2022-03-14 ENCOUNTER — Inpatient Hospital Stay
Admission: EM | Admit: 2022-03-14 | Discharge: 2022-03-15 | DRG: 641 | Disposition: A | Discharge status: Home / Self Care | Payer: Medicaid Other | Attending: Internal Medicine | Admitting: Internal Medicine

## 2022-03-14 ENCOUNTER — Telehealth (HOSPITAL_COMMUNITY): Payer: Self-pay | Admitting: Pharmacy Technician

## 2022-03-14 ENCOUNTER — Other Ambulatory Visit: Payer: Self-pay

## 2022-03-14 ENCOUNTER — Other Ambulatory Visit (HOSPITAL_COMMUNITY): Payer: Self-pay

## 2022-03-14 DIAGNOSIS — K529 Noninfective gastroenteritis and colitis, unspecified: Secondary | ICD-10-CM | POA: Diagnosis present

## 2022-03-14 DIAGNOSIS — G629 Polyneuropathy, unspecified: Secondary | ICD-10-CM | POA: Diagnosis not present

## 2022-03-14 DIAGNOSIS — E871 Hypo-osmolality and hyponatremia: Secondary | ICD-10-CM | POA: Diagnosis present

## 2022-03-14 DIAGNOSIS — E1165 Type 2 diabetes mellitus with hyperglycemia: Secondary | ICD-10-CM | POA: Insufficient documentation

## 2022-03-14 DIAGNOSIS — K219 Gastro-esophageal reflux disease without esophagitis: Secondary | ICD-10-CM | POA: Diagnosis present

## 2022-03-14 DIAGNOSIS — E1042 Type 1 diabetes mellitus with diabetic polyneuropathy: Secondary | ICD-10-CM | POA: Diagnosis present

## 2022-03-14 DIAGNOSIS — Z833 Family history of diabetes mellitus: Secondary | ICD-10-CM | POA: Diagnosis not present

## 2022-03-14 DIAGNOSIS — F32A Depression, unspecified: Secondary | ICD-10-CM | POA: Diagnosis present

## 2022-03-14 DIAGNOSIS — E103491 Type 1 diabetes mellitus with severe nonproliferative diabetic retinopathy without macular edema, right eye: Secondary | ICD-10-CM | POA: Diagnosis present

## 2022-03-14 DIAGNOSIS — R112 Nausea with vomiting, unspecified: Secondary | ICD-10-CM

## 2022-03-14 DIAGNOSIS — J45909 Unspecified asthma, uncomplicated: Secondary | ICD-10-CM | POA: Diagnosis present

## 2022-03-14 DIAGNOSIS — E1065 Type 1 diabetes mellitus with hyperglycemia: Secondary | ICD-10-CM

## 2022-03-14 DIAGNOSIS — I119 Hypertensive heart disease without heart failure: Secondary | ICD-10-CM | POA: Diagnosis present

## 2022-03-14 DIAGNOSIS — I10 Essential (primary) hypertension: Secondary | ICD-10-CM | POA: Diagnosis present

## 2022-03-14 DIAGNOSIS — F39 Unspecified mood [affective] disorder: Secondary | ICD-10-CM | POA: Diagnosis present

## 2022-03-14 DIAGNOSIS — F419 Anxiety disorder, unspecified: Secondary | ICD-10-CM | POA: Diagnosis present

## 2022-03-14 DIAGNOSIS — Z90710 Acquired absence of both cervix and uterus: Secondary | ICD-10-CM | POA: Diagnosis not present

## 2022-03-14 DIAGNOSIS — E785 Hyperlipidemia, unspecified: Secondary | ICD-10-CM | POA: Diagnosis present

## 2022-03-14 DIAGNOSIS — E876 Hypokalemia: Secondary | ICD-10-CM | POA: Diagnosis not present

## 2022-03-14 DIAGNOSIS — F17210 Nicotine dependence, cigarettes, uncomplicated: Secondary | ICD-10-CM | POA: Diagnosis present

## 2022-03-14 DIAGNOSIS — R000 Tachycardia, unspecified: Secondary | ICD-10-CM | POA: Diagnosis present

## 2022-03-14 DIAGNOSIS — Z825 Family history of asthma and other chronic lower respiratory diseases: Secondary | ICD-10-CM | POA: Diagnosis not present

## 2022-03-14 DIAGNOSIS — M1990 Unspecified osteoarthritis, unspecified site: Secondary | ICD-10-CM | POA: Diagnosis present

## 2022-03-14 DIAGNOSIS — Z801 Family history of malignant neoplasm of trachea, bronchus and lung: Secondary | ICD-10-CM | POA: Diagnosis not present

## 2022-03-14 DIAGNOSIS — Z794 Long term (current) use of insulin: Secondary | ICD-10-CM | POA: Diagnosis not present

## 2022-03-14 HISTORY — DX: Type 2 diabetes mellitus with hyperglycemia: E11.65

## 2022-03-14 HISTORY — DX: Noninfective gastroenteritis and colitis, unspecified: K52.9

## 2022-03-14 HISTORY — DX: Type 1 diabetes mellitus with hyperglycemia: E10.65

## 2022-03-14 LAB — CBC
HCT: 38.7 % (ref 36.0–46.0)
Hemoglobin: 13.7 g/dL (ref 12.0–15.0)
MCH: 29.8 pg (ref 26.0–34.0)
MCHC: 35.4 g/dL (ref 30.0–36.0)
MCV: 84.1 fL (ref 80.0–100.0)
Platelets: 304 10*3/uL (ref 150–400)
RBC: 4.6 MIL/uL (ref 3.87–5.11)
RDW: 11.9 % (ref 11.5–15.5)
WBC: 8.8 10*3/uL (ref 4.0–10.5)
nRBC: 0 % (ref 0.0–0.2)

## 2022-03-14 LAB — BASIC METABOLIC PANEL
Anion gap: 11 (ref 5–15)
Anion gap: 6 (ref 5–15)
Anion gap: 10 (ref 5–15)
BUN: 8 mg/dL (ref 6–20)
BUN: 8 mg/dL (ref 6–20)
BUN: 8 mg/dL (ref 6–20)
CO2: 19 mmol/L — ABNORMAL LOW (ref 22–32)
CO2: 21 mmol/L — ABNORMAL LOW (ref 22–32)
CO2: 20 mmol/L — ABNORMAL LOW (ref 22–32)
Calcium: 8.4 mg/dL — ABNORMAL LOW (ref 8.9–10.3)
Calcium: 8.5 mg/dL — ABNORMAL LOW (ref 8.9–10.3)
Calcium: 8.9 mg/dL (ref 8.9–10.3)
Chloride: 94 mmol/L — ABNORMAL LOW (ref 98–111)
Chloride: 102 mmol/L (ref 98–111)
Chloride: 100 mmol/L (ref 98–111)
Creatinine, Ser: 0.59 mg/dL (ref 0.44–1.00)
Creatinine, Ser: 0.82 mg/dL (ref 0.44–1.00)
Creatinine, Ser: 0.67 mg/dL (ref 0.44–1.00)
GFR, Estimated: >60 mL/min (ref >60)
GFR, Estimated: >60 mL/min (ref >60)
GFR, Estimated: >60 mL/min (ref >60)
Glucose, Bld: 198 mg/dL — ABNORMAL HIGH (ref 70–99)
Glucose, Bld: 300 mg/dL — ABNORMAL HIGH (ref 70–99)
Glucose, Bld: 360 mg/dL — ABNORMAL HIGH (ref 70–99)
Potassium: 3.9 mmol/L (ref 3.5–5.1)
Potassium: 3.9 mmol/L (ref 3.5–5.1)
Potassium: 3.7 mmol/L (ref 3.5–5.1)
Sodium: 124 mmol/L — ABNORMAL LOW (ref 135–145)
Sodium: 129 mmol/L — ABNORMAL LOW (ref 135–145)
Sodium: 130 mmol/L — ABNORMAL LOW (ref 135–145)

## 2022-03-14 LAB — URINALYSIS, ROUTINE W REFLEX MICROSCOPIC
Bilirubin Urine: NEGATIVE
Glucose, UA: >=500 mg/dL — AB
Ketones, ur: 20 mg/dL — AB
Leukocytes,Ua: NEGATIVE
Nitrite: NEGATIVE
Protein, ur: NEGATIVE mg/dL
Specific Gravity, Urine: 1.001 — ABNORMAL LOW (ref 1.005–1.030)
pH: 5 (ref 5.0–8.0)

## 2022-03-14 LAB — BLOOD GAS, VENOUS
Acid-base deficit: 4.5 mmol/L — ABNORMAL HIGH (ref 0.0–2.0)
Bicarbonate: 19.2 mmol/L — ABNORMAL LOW (ref 20.0–28.0)
O2 Saturation: 83.1 %
Patient temperature: 37
pCO2, Ven: 31 mmHg — ABNORMAL LOW (ref 44–60)
pH, Ven: 7.4 (ref 7.25–7.43)
pO2, Ven: 51 mmHg — ABNORMAL HIGH (ref 32–45)

## 2022-03-14 LAB — CBG MONITORING, ED
Glucose-Capillary: 205 mg/dL — ABNORMAL HIGH (ref 70–99)
Glucose-Capillary: 278 mg/dL — ABNORMAL HIGH (ref 70–99)
Glucose-Capillary: 167 mg/dL — ABNORMAL HIGH (ref 70–99)
Glucose-Capillary: 75 mg/dL (ref 70–99)
Glucose-Capillary: 233 mg/dL — ABNORMAL HIGH (ref 70–99)

## 2022-03-14 LAB — TROPONIN I (HIGH SENSITIVITY): Troponin I (High Sensitivity): 5 ng/L (ref <18)

## 2022-03-14 LAB — NA AND K (SODIUM & POTASSIUM), RAND UR
Potassium Urine: 7 mmol/L
Sodium, Ur: 14 mmol/L

## 2022-03-14 LAB — OSMOLALITY, URINE: Osmolality, Ur: 136 mOsm/kg — ABNORMAL LOW (ref 300–900)

## 2022-03-14 LAB — OSMOLALITY: Osmolality: 266 mOsm/kg — ABNORMAL LOW (ref 275–295)

## 2022-03-14 LAB — GLUCOSE, CAPILLARY: Glucose-Capillary: 381 mg/dL — ABNORMAL HIGH (ref 70–99)

## 2022-03-14 LAB — MAGNESIUM: Magnesium: 1.6 mg/dL — ABNORMAL LOW (ref 1.7–2.4)

## 2022-03-14 LAB — TSH: TSH: 0.313 u[IU]/mL — ABNORMAL LOW (ref 0.350–4.500)

## 2022-03-14 LAB — T4, FREE: Free T4: 1.07 ng/dL (ref 0.61–1.12)

## 2022-03-14 LAB — HIV ANTIBODY (ROUTINE TESTING W REFLEX): HIV Screen 4th Generation wRfx: NONREACTIVE

## 2022-03-14 MED ORDER — ALPRAZOLAM 0.5 MG PO TABS
0.5000 mg | ORAL_TABLET | Freq: Three times a day (TID) | ORAL | Status: DC
  Administered 2022-03-14 – 2022-03-15 (×3): 0.5 mg via ORAL
  Filled 2022-03-14 (×3): qty 1

## 2022-03-14 MED ORDER — ONDANSETRON HCL 4 MG PO TABS: 4.0000 mg | ORAL_TABLET | Freq: Four times a day (QID) | ORAL | Status: DC | PRN

## 2022-03-14 MED ORDER — INSULIN ASPART 100 UNIT/ML IJ SOLN
3.0000 [IU] | Freq: Three times a day (TID) | INTRAMUSCULAR | Status: DC
  Administered 2022-03-14 – 2022-03-15 (×3): 3 [IU] via SUBCUTANEOUS
  Filled 2022-03-14 (×3): qty 1

## 2022-03-14 MED ORDER — SERTRALINE HCL 50 MG PO TABS
50.0000 mg | ORAL_TABLET | Freq: Every day | ORAL | Status: DC
  Administered 2022-03-14 – 2022-03-15 (×2): 50 mg via ORAL
  Filled 2022-03-14 (×2): qty 1

## 2022-03-14 MED ORDER — INSULIN PUMP
Freq: Three times a day (TID) | SUBCUTANEOUS | Status: DC
  Filled 2022-03-14: qty 1

## 2022-03-14 MED ORDER — INSULIN ASPART 100 UNIT/ML IJ SOLN
0.0000 [IU] | Freq: Three times a day (TID) | INTRAMUSCULAR | Status: DC
  Administered 2022-03-14 – 2022-03-15 (×3): 3 [IU] via SUBCUTANEOUS
  Filled 2022-03-14 (×2): qty 1

## 2022-03-14 MED ORDER — ACETAMINOPHEN 650 MG RE SUPP: 650.0000 mg | Freq: Four times a day (QID) | RECTAL | Status: DC | PRN

## 2022-03-14 MED ORDER — ENOXAPARIN SODIUM 40 MG/0.4ML IJ SOSY
40.0000 mg | PREFILLED_SYRINGE | INTRAMUSCULAR | Status: DC
Start: 2022-03-14 — End: 2022-03-15
  Administered 2022-03-14 – 2022-03-15 (×2): 40 mg via SUBCUTANEOUS
  Filled 2022-03-14 (×2): qty 0.4

## 2022-03-14 MED ORDER — ONDANSETRON HCL 4 MG/2ML IJ SOLN: 4.0000 mg | Freq: Once | INTRAMUSCULAR | Status: DC

## 2022-03-14 MED ORDER — HYDROXYZINE HCL 50 MG PO TABS: 25.0000 mg | ORAL_TABLET | Freq: Every day | ORAL | Status: DC | PRN

## 2022-03-14 MED ORDER — SODIUM CHLORIDE 0.9 % IV SOLN: INTRAVENOUS | Status: DC

## 2022-03-14 MED ORDER — PANTOPRAZOLE SODIUM 40 MG IV SOLR
40.0000 mg | Freq: Two times a day (BID) | INTRAVENOUS | Status: DC
  Administered 2022-03-14 – 2022-03-15 (×3): 40 mg via INTRAVENOUS
  Filled 2022-03-14 (×3): qty 10

## 2022-03-14 MED ORDER — INSULIN ASPART 100 UNIT/ML IJ SOLN
0.0000 [IU] | INTRAMUSCULAR | Status: DC
  Administered 2022-03-14: 4 [IU] via SUBCUTANEOUS
  Administered 2022-03-14: 15 [IU] via SUBCUTANEOUS
  Filled 2022-03-14 (×2): qty 1

## 2022-03-14 MED ORDER — SODIUM CHLORIDE 0.9 % IV BOLUS (SEPSIS)
1000.0000 mL | Freq: Once | INTRAVENOUS | Status: AC
  Administered 2022-03-14: 1000 mL via INTRAVENOUS

## 2022-03-14 MED ORDER — ATORVASTATIN CALCIUM 20 MG PO TABS: 10.0000 mg | ORAL_TABLET | Freq: Every day | ORAL | Status: DC

## 2022-03-14 MED ORDER — ALPRAZOLAM 0.5 MG PO TABS
1.0000 mg | ORAL_TABLET | Freq: Once | ORAL | Status: AC
  Administered 2022-03-14: 1 mg via ORAL
  Filled 2022-03-14: qty 2

## 2022-03-14 MED ORDER — ROPINIROLE HCL 1 MG PO TABS
1.0000 mg | ORAL_TABLET | Freq: Every day | ORAL | Status: DC
  Administered 2022-03-14: 1 mg via ORAL
  Filled 2022-03-14: qty 1

## 2022-03-14 MED ORDER — PANTOPRAZOLE SODIUM 40 MG PO TBEC: 40.0000 mg | DELAYED_RELEASE_TABLET | Freq: Every day | ORAL | Status: DC

## 2022-03-14 MED ORDER — MAGNESIUM HYDROXIDE 400 MG/5ML PO SUSP
30.0000 mL | Freq: Every day | ORAL | Status: DC | PRN
Start: 2022-03-14 — End: 2022-03-15

## 2022-03-14 MED ORDER — MAGNESIUM SULFATE 2 GM/50ML IV SOLN
2.0000 g | Freq: Once | INTRAVENOUS | Status: AC
  Administered 2022-03-14: 2 g via INTRAVENOUS
  Filled 2022-03-14: qty 50

## 2022-03-14 MED ORDER — ONDANSETRON HCL 4 MG/2ML IJ SOLN: 4.0000 mg | Freq: Four times a day (QID) | INTRAMUSCULAR | Status: DC | PRN

## 2022-03-14 MED ORDER — ACETAMINOPHEN 325 MG PO TABS: 650.0000 mg | ORAL_TABLET | Freq: Four times a day (QID) | ORAL | Status: DC | PRN

## 2022-03-14 MED ORDER — PANTOPRAZOLE SODIUM 40 MG IV SOLR: 40.0000 mg | Freq: Two times a day (BID) | INTRAVENOUS | Status: DC

## 2022-03-14 MED ORDER — INSULIN GLARGINE-YFGN 100 UNIT/ML ~~LOC~~ SOLN
5.0000 [IU] | Freq: Two times a day (BID) | SUBCUTANEOUS | Status: DC
  Administered 2022-03-14: 5 [IU] via SUBCUTANEOUS
  Filled 2022-03-14 (×2): qty 0.05

## 2022-03-14 MED ORDER — TRAZODONE HCL 50 MG PO TABS: 25.0000 mg | ORAL_TABLET | Freq: Every evening | ORAL | Status: DC | PRN

## 2022-03-14 MED ORDER — INSULIN ASPART 100 UNIT/ML IJ SOLN
0.0000 [IU] | Freq: Every day | INTRAMUSCULAR | Status: DC
  Administered 2022-03-14: 5 [IU] via SUBCUTANEOUS
  Filled 2022-03-14: qty 1

## 2022-03-14 MED ORDER — VITAMIN D 25 MCG (1000 UNIT) PO TABS
2000.0000 [IU] | ORAL_TABLET | Freq: Every day | ORAL | Status: DC
  Filled 2022-03-14 (×2): qty 2

## 2022-03-14 MED ORDER — HYDROCODONE-ACETAMINOPHEN 5-325 MG PO TABS
1.0000 | ORAL_TABLET | Freq: Three times a day (TID) | ORAL | Status: DC | PRN
  Administered 2022-03-14 – 2022-03-15 (×2): 1 via ORAL
  Filled 2022-03-14 (×2): qty 1

## 2022-03-14 MED ORDER — ALPRAZOLAM 0.5 MG PO TABS: 1.0000 mg | ORAL_TABLET | Freq: Four times a day (QID) | ORAL | Status: DC

## 2022-03-14 MED ORDER — ALBUTEROL SULFATE (2.5 MG/3ML) 0.083% IN NEBU: 2.5000 mg | INHALATION_SOLUTION | RESPIRATORY_TRACT | Status: DC | PRN

## 2022-03-14 MED ORDER — TIOTROPIUM BROMIDE MONOHYDRATE 18 MCG IN CAPS
1.0000 | ORAL_CAPSULE | Freq: Every day | RESPIRATORY_TRACT | Status: DC
  Filled 2022-03-14: qty 5

## 2022-03-14 MED ORDER — GABAPENTIN 300 MG PO CAPS
300.0000 mg | ORAL_CAPSULE | Freq: Three times a day (TID) | ORAL | Status: DC
  Administered 2022-03-14 – 2022-03-15 (×4): 300 mg via ORAL
  Filled 2022-03-14 (×4): qty 1

## 2022-03-14 MED ORDER — INSULIN ASPART 100 UNIT/ML IV SOLN
4.0000 [IU] | Freq: Once | INTRAVENOUS | Status: DC
Start: 2022-03-14 — End: 2022-03-14
  Filled 2022-03-14: qty 0.04

## 2022-03-14 MED ORDER — GLUCAGON HCL RDNA (DIAGNOSTIC) 1 MG IJ SOLR
1.0000 mg | Freq: Once | INTRAMUSCULAR | Status: DC | PRN
Start: 2022-03-14 — End: 2022-03-15

## 2022-03-14 MED ORDER — ADULT MULTIVITAMIN W/MINERALS CH
1.0000 | ORAL_TABLET | Freq: Every day | ORAL | Status: DC
  Filled 2022-03-14 (×2): qty 1

## 2022-03-14 MED ORDER — SODIUM CHLORIDE 0.9 % IV BOLUS: 1000.0000 mL | Freq: Once | INTRAVENOUS | Status: DC

## 2022-03-14 MED ORDER — AMLODIPINE BESYLATE 5 MG PO TABS
5.0000 mg | ORAL_TABLET | Freq: Every day | ORAL | Status: DC
  Administered 2022-03-14 – 2022-03-15 (×2): 5 mg via ORAL
  Filled 2022-03-14 (×2): qty 1

## 2022-03-14 NOTE — Assessment & Plan Note (Signed)
Patient was on Xanax for many years and apparently ran out of her prescription for the past 3 days. -Restart Xanax at a lower dose -She should work with PCP for a slow taper

## 2022-03-14 NOTE — Assessment & Plan Note (Signed)
-   We will continue Neurontin. ?

## 2022-03-14 NOTE — Assessment & Plan Note (Addendum)
-   She is having mild resolving DKA. -Admitting provider decided to monitor more frequently with SSI instead of Endo tool. -Quite labile CBG. Concern of defected/unsafe settings of insulin pump. -Add Semglee 5 units twice daily -Continue with SSI -Add 3 units with meal -Monitor closely

## 2022-03-14 NOTE — Assessment & Plan Note (Signed)
-   The patient will be hydrated with IV normal saline. - We will utilize as needed antiemetics and antidiarrheals.

## 2022-03-14 NOTE — Assessment & Plan Note (Addendum)
Hyponatremia labs with hypoosmolar hyponatremia. Urine osmolality was also low.  Patient was on Zoloft which can be contributory to her chronic hyponatremia worsened with GI losses. Sodium improving with IV fluid. -Continue with normal saline -Monitor sodium

## 2022-03-14 NOTE — Progress Notes (Signed)
Progress Note   Patient: [name] YWV:[id number] DOB: [birth year] DOA: 03/14/2022     0 DOS: the patient was seen and examined on 03/14/2022   Brief hospital course: Taken from H&P.  [name] is a 46 y.o. female with medical history significant for asthma, depression, type 1 diabetes mellitus, GERD, hyperlipidemia and osteoarthritis, who presented to the emergency room with onset of intractable nausea and vomiting with associated diarrhea over the last couple of days.  No bilious vomitus or hematemesis.  No melena or bright red bleeding per rectum she admitted to tactile fever and chills.  She was having lower abdominal pain as well as admitted to headache without dizziness or blurred vision.  She stated that she has not had any food intake over the last couple of days.  She was fairly emotional as her husband is dying from non-small cell lung cancer and her daughter is getting married soon.  She denies any chest pain or palpitations.  No cough or wheezing or dyspnea.   ED Course: Upon presentation to the ER, BP was 134/74 with otherwise normal vital signs..  Blood gas showed a pH 7.4 and HCO3 of 19.2 and anion gap was 17 and later 11 with a blood glucose of 215 and sodium of 120 and chloride of 83.  Magnesium was 1.6.  High sensitive troponin was 6 and later 5.  CBC showed etc. 12.7 and hemoconcentration.  TSH was 0.31 and UA showed more than 500 glucose with 20 ketones, many bacteria with 0-5 WBCs and 0-5 RBCs. EKG as reviewed by me :  EKG showed sinus tachycardia with a rate of 114 with biatrial enlargement and septal Q waves.   The patient was given 1 mg p.o. Xanax, 1 L bolus of IV lactated Ringer, 4 mg of IV Zofran and 1 L bolus of IV normal saline.  8/29: Sodium with slight improvement to 124, hyponatremia labs with low serum and urine osmolality and urinary sodium of 14.  TSH was low at 0.313.  Checking free T4 Most likely secondary to GI losses. Patient has an history of  chronic mild hyponatremia, she is on Zoloft-need to discuss with her PCP regarding the need. We will continue to monitor sodium and giving some IV fluid. Patient with labile CBG.  Diabetes coordinator checked her insulin pump settings and found them unsafe.  She had an upcoming appointment with her endocrinologist next week.  We decided to discontinue insulin pump at this time, added Semglee along with SSI and mealtime coverage.    Assessment and Plan: * Hyponatremia Hyponatremia labs with hypoosmolar hyponatremia. Urine osmolality was also low.  Patient was on Zoloft which can be contributory to her chronic hyponatremia worsened with GI losses. Sodium improving with IV fluid. -Continue with normal saline -Monitor sodium   Acute gastroenteritis - The patient will be hydrated with IV normal saline. - We will utilize as needed antiemetics and antidiarrheals.  Uncontrolled type 1 diabetes mellitus with hyperglycemia, with long-term current use of insulin ([place]) - She is having mild resolving DKA. -Admitting provider decided to monitor more frequently with SSI instead of Endo tool. -Quite labile CBG. Concern of defected/unsafe settings of insulin pump. -Add Semglee 5 units twice daily -Continue with SSI -Add 3 units with meal -Monitor closely  Essential hypertension - We will continue antihypertensives  Peripheral polyneuropathy - We will continue Neurontin.  Anxiety Patient was on Xanax for many years and apparently ran out of her prescription for the past 3  days. -Restart Xanax at a lower dose -She should work with PCP for a slow taper  GERD - The patient will be placed on IV PPI therapy   Subjective: Patient was seen and examined this morning.  Feeling much improved.  No more nausea and vomiting and wants to eat.  Per patient she was not using her Xanax and that might be  responsible for her not feeling well.  Per patient she is using her Xanax regularly for the past many  years.  Physical Exam: Vitals:   03/14/22 0630 03/14/22 0700 03/14/22 0747 03/14/22 0900  BP: (!) 151/67 120/66  (!) 148/80  Pulse: (!) 110 (!) 105  93  Resp: (!) '22 16  16  '$ Temp: 98.7 F (37.1 C)  98.3 F (36.8 C)   TempSrc: Oral  Oral   SpO2: 95% 96%  96%  Weight:      Height:       General.  Well-developed lady, in no acute distress. Pulmonary.  Lungs clear bilaterally, normal respiratory effort. CV.  Regular rate and rhythm, no JVD, rub or murmur. Abdomen.  Soft, nontender, nondistended, BS positive. CNS.  Alert and oriented .  No focal neurologic deficit. Extremities.  No edema, no cyanosis, pulses intact and symmetrical. Psychiatry.  Judgment and insight appears normal.  Data Reviewed: Prior data reviewed  Family Communication: Discussed with patient  Disposition: Status is: Inpatient Remains inpatient appropriate because: Severity of illness   Planned Discharge Destination: Home  DVT prophylaxis.  Lovenox Time spent: 45 minutes  This record has been created using Systems analyst. Errors have been sought and corrected,but may not always be located. Such creation errors do not reflect on the standard of care.  Author: Lorella Nimrod, MD 03/14/2022 2:02 PM  For on call review [email or web address].

## 2022-03-14 NOTE — Assessment & Plan Note (Signed)
-   We will continue antihypertensives. 

## 2022-03-14 NOTE — ED Provider Notes (Signed)
[facility] Provider Note    Event Date/Time   First MD Initiated Contact with Patient 03/14/22 0113     (approximate)   History   Emesis   HPI  [name] is a 46 y.o. female with history of insulin-dependent diabetes, hyperlipidemia, hypertension recently started on HCTZ 2 days ago who presents to the emergency department with nausea, vomiting and diarrhea for 2 days.  Complains of mild headache but states she thinks this is from not having her Xanax for the past few days because she was told that she was getting pain medication from a different doctor that her previous doctor could no longer fill her benzodiazepines.  She is wondering if this is causing her vomiting as well.  No chest pain, abdominal pain, dysuria, vaginal bleeding or discharge.  She has had a hysterectomy.  States she does take sodium because she has a history of hyponatremia but her sodium level is normally around 130.   History provided by patient.    Past Medical History:  Diagnosis Date   Anxiety state, unspecified 03/26/2009   ASTHMA 02/02/2007   Asthma    DEPRESSION 02/16/2009   DIABETES MELLITUS, TYPE I 02/02/2007   Endometriosis    GERD 12/21/2008   Herpes    at 18   Hyperlipidemia    Lesion of vocal cord    Osteoarthritis    Severe nonproliferative diabetic retinopathy of right eye, without macular edema, associated with type 1 diabetes mellitus ([place]) 11/04/2019   Shortness of breath 09/25/2007   Vocal fold leukoplakia     Past Surgical History:  Procedure Laterality Date   ABDOMINAL HYSTERECTOMY  2009   BSO   DIRECT LARYNGOSCOPY N/A 08/17/2017   Procedure: DIRECT LARYNGOSCOPY WITH OPERATING TELESCOPE WITH BIOPSY;  Surgeon: Helayne Seminole, MD;  Location: [facility];  Service: ENT;  Laterality: N/A;   NASOPHARYNGEAL BIOPSY Right 08/17/2017   Procedure: NASOPHARYNGEAL BIOPSY RIGHT NASAL LESION;  Surgeon: Helayne Seminole, MD;  Location: [facility];  Service: ENT;   Laterality: Right;    MEDICATIONS:  Prior to Admission medications   Medication Sig Start Date End Date Taking? Authorizing Provider  ALPRAZolam Duanne Moron) 1 MG tablet Take 1 mg by mouth 4 (four) times daily.   Yes [provider]  amLODipine (NORVASC) 5 MG tablet Take 1 tablet (5 mg total) by mouth daily. Please make follow up appointment for future refills.TAKE 1 TABLET (5 MG TOTAL) BY MOUTH DAILY. 09/01/21 03/14/22 Yes Elouise Munroe, MD  atorvastatin (LIPITOR) 10 MG tablet Take 1 tablet (10 mg total) by mouth at bedtime. 09/26/18  Yes Luetta Nutting, DO  Cholecalciferol (VITAMIN D3) 50 MCG (2000 UT) capsule Take 2,000 Units by mouth daily.   Yes [provider]  gabapentin (NEURONTIN) 300 MG capsule Take 1 capsule (300 mg total) by mouth 3 (three) times daily. As needed 09/26/18  Yes Luetta Nutting, DO  Multiple Vitamin (MULTIVITAMIN WITH MINERALS) TABS tablet Take 1 tablet by mouth daily.   Yes [provider]  omeprazole (PRILOSEC) 40 MG capsule TAKE 1 CAPSULE BY MOUTH 2 TIMES DAILY. 11/30/21  Yes Willia Craze, NP  rOPINIRole (REQUIP) 1 MG tablet TAKE 1 TABLET BY MOUTH AT BEDTIME (PLZ SCHED APPT WITH NEW PROVIDER)  Please make appointment with Dr. Rex Kras.                 Needs to make appointment with Dr. Rex Kras. Patient taking differently: Take 1 mg by mouth at  bedtime. TAKE 1 TABLET BY MOUTH AT BEDTIME (PLZ SCHED APPT WITH NEW PROVIDER)  Please make appointment with Dr. Rex Kras.                 Needs to make appointment with Dr. Rex Kras. 01/26/20  Yes [name], MD  sertraline (ZOLOFT) 25 MG tablet Take 50 mg by mouth daily. 02/07/22  Yes [provider]  Tiotropium Bromide Monohydrate (SPIRIVA RESPIMAT) 2.5 MCG/ACT AERS Inhale 2 puffs into the lungs daily. 11/03/21  Yes Margaretha Seeds, MD  Continuous Blood Gluc Sensor (DEXCOM G6 SENSOR) MISC 1 each by Does not apply route See admin instructions. Change  sensor every 10 days; E10.9 03/07/22   Elayne Snare, MD  Continuous Blood Gluc Transmit (DEXCOM G6 TRANSMITTER) MISC Use as instructed to check blood sugars, change every 3 months. 03/08/22   Elayne Snare, MD  glucagon 1 MG injection Inject 1 mg into the muscle once as needed. 01/05/20   Renato Shin, MD  HYDROcodone-acetaminophen (NORCO/VICODIN) 5-325 MG tablet Take 1 tablet by mouth 3 (three) times daily as needed. 03/11/22   [provider]  hydrOXYzine (ATARAX) 25 MG tablet Take 25 mg by mouth daily as needed. 02/07/22   [provider]  Ibuprofen (ADVIL PO) Take 2-3 tablets by mouth every 6 (six) hours as needed.    [provider]  ipratropium (ATROVENT HFA) 17 MCG/ACT inhaler Inhale 2 puffs into the lungs every 4 (four) hours as needed for wheezing. 11/03/21   Margaretha Seeds, MD  NOVOLOG 100 UNIT/ML injection INJECT 40 UNITS DAILY PER INSULIN PUMP (MAX DOSAGE) 08/01/21   Renato Shin, MD  VENTOLIN HFA [phone] Base) MCG/ACT inhaler SMARTSIG:2 Puff(s) By Mouth Every 4 Hours PRN 01/14/22   [provider]    Physical Exam   Triage Vital Signs: ED Triage Vitals  Enc Vitals Group     BP 03/13/22 2202 (!) 200/101     Pulse Rate 03/13/22 2202 (!) 124     Resp 03/13/22 2202 18     Temp 03/13/22 2202 98.6 F (37 C)     Temp Source 03/13/22 2202 Oral     SpO2 03/13/22 2202 95 %     Weight 03/13/22 2201 120 lb (54.4 kg)     Height 03/13/22 2201 '5\' 5"'$  (1.651 m)     Head Circumference --      Peak Flow --      Pain Score 03/13/22 2201 0     Pain Loc --      Pain Edu? --      Excl. in [place]? --     Most recent vital signs: Vitals:   03/14/22 0400 03/14/22 0542  BP: 134/73 (!) 140/74  Pulse: (!) 101 (!) 113  Resp: 19 20  Temp:  98.7 F (37.1 C)  SpO2: 96% 95%    CONSTITUTIONAL: Alert and oriented and responds appropriately to questions. Well-appearing; well-nourished HEAD: Normocephalic, atraumatic EYES: Conjunctivae clear, pupils appear equal, sclera  nonicteric ENT: normal nose; moist mucous membranes NECK: Supple, normal ROM CARD: RRR; S1 and S2 appreciated; no murmurs, no clicks, no rubs, no gallops RESP: Normal chest excursion without splinting or tachypnea; breath sounds clear and equal bilaterally; no wheezes, no rhonchi, no rales, no hypoxia or respiratory distress, speaking full sentences ABD/GI: Normal bowel sounds; non-distended; soft, non-tender, no rebound, no guarding, no peritoneal signs BACK: The back appears normal EXT: Normal ROM in all joints; no deformity noted, no edema; no cyanosis SKIN:  Normal color for age and race; warm; no rash on exposed skin NEURO: Moves all extremities equally, normal speech PSYCH: The patient's mood and manner are appropriate.   ED Results / Procedures / Treatments   LABS: (all labs ordered are listed, but only abnormal results are displayed) Labs Reviewed  COMPREHENSIVE METABOLIC PANEL - Abnormal; Notable for the following components:      Result Value   Sodium 120 (*)    Chloride 83 (*)    CO2 20 (*)    Glucose, Bld 215 (*)    Total Protein 8.7 (*)    Anion gap 17 (*)    All other components within normal limits  CBC - Abnormal; Notable for the following components:   WBC 12.7 (*)    RBC 5.40 (*)    Hemoglobin 16.3 (*)    All other components within normal limits  URINALYSIS, ROUTINE W REFLEX MICROSCOPIC - Abnormal; Notable for the following components:   Color, Urine STRAW (*)    APPearance HAZY (*)    Specific Gravity, Urine 1.001 (*)    Glucose, UA >=500 (*)    Hgb urine dipstick MODERATE (*)    Ketones, ur 20 (*)    Bacteria, UA MANY (*)    All other components within normal limits  MAGNESIUM - Abnormal; Notable for the following components:   Magnesium 1.6 (*)    All other components within normal limits  BLOOD GAS, VENOUS - Abnormal; Notable for the following components:   pCO2, Ven 31 (*)    pO2, Ven 51 (*)    Bicarbonate 19.2 (*)    Acid-base deficit 4.5 (*)     All other components within normal limits  BASIC METABOLIC PANEL - Abnormal; Notable for the following components:   Sodium 124 (*)    Chloride 94 (*)    CO2 19 (*)    Glucose, Bld 198 (*)    Calcium 8.4 (*)    All other components within normal limits  TSH - Abnormal; Notable for the following components:   TSH 0.313 (*)    All other components within normal limits  OSMOLALITY - Abnormal; Notable for the following components:   Osmolality 266 (*)    All other components within normal limits  OSMOLALITY, URINE - Abnormal; Notable for the following components:   Osmolality, Ur 136 (*)    All other components within normal limits  CBG MONITORING, ED - Abnormal; Notable for the following components:   Glucose-Capillary 199 (*)    All other components within normal limits  CBG MONITORING, ED - Abnormal; Notable for the following components:   Glucose-Capillary 205 (*)    All other components within normal limits  LIPASE, BLOOD  CBC  NA AND K (SODIUM & POTASSIUM), RAND UR  HIV ANTIBODY (ROUTINE TESTING W REFLEX)  TROPONIN I (HIGH SENSITIVITY)  TROPONIN I (HIGH SENSITIVITY)     EKG:  EKG Interpretation  Date/Time:  Monday March 13 2022 22:08:58 EDT Ventricular Rate:  114 PR Interval:  112 QRS Duration: 78 QT Interval:  330 QTC Calculation: 454 R Axis:   89 Text Interpretation: Sinus tachycardia Biatrial enlargement Septal infarct (cited on or before 03-May-2021) Abnormal ECG When compared with ECG of 11-May-2021 15:01, No significant change was found Confirmed by Pryor Curia [phone]) on 03/14/2022 1:23:22 AM         RADIOLOGY: My personal review and interpretation of imaging:    I have personally reviewed all radiology reports.   No results  found.   PROCEDURES:  Critical Care performed: Yes, see critical care procedure note(s)   CRITICAL CARE Performed by: Pryor Curia   Total critical care time: 45 minutes  Critical care time was exclusive of  separately billable procedures and treating other patients.  Critical care was necessary to treat or prevent imminent or life-threatening deterioration.  Critical care was time spent personally by me on the following activities: development of treatment plan with patient and/or surrogate as well as nursing, discussions with consultants, evaluation of patient's response to treatment, examination of patient, obtaining history from patient or surrogate, ordering and performing treatments and interventions, ordering and review of laboratory studies, ordering and review of radiographic studies, pulse oximetry and re-evaluation of patient's condition.   Marland Kitchen1-3 Lead EKG Interpretation  Performed by: Ericberto Padget, Delice Bison, DO Authorized by: Nicolaos Mitrano, Delice Bison, DO     Interpretation: abnormal     ECG rate:  114   ECG rate assessment: tachycardic     Rhythm: sinus tachycardia     Ectopy: none     Conduction: normal       IMPRESSION / MDM / ASSESSMENT AND PLAN / ED COURSE  I reviewed the triage vital signs and the nursing notes.    Patient here with nausea, vomiting and diarrhea.  No abdominal pain.  The patient is on the cardiac monitor to evaluate for evidence of arrhythmia and/or significant heart rate changes.   DIFFERENTIAL DIAGNOSIS (includes but not limited to):   Dehydration, gastroenteritis, colitis, less likely diverticulitis, appendicitis, perforation, bowel obstruction given benign exam.  Benzodiazepine withdrawal also on the differential.   Patient's presentation is most consistent with acute presentation with potential threat to life or bodily function.   PLAN: Labs obtained from triage including CBC, CMP, lipase, troponin.  Labs show leukocytosis of 12.7.  Sodium is low at 120.  Glucose 215.  Bicarb 20 and anion gap is 17.  She could be in early DKA.  Will check VBG and hydrate patient.  EKG shows sinus tachycardia without interval abnormality.  We will keep her on cardiac monitoring.   Discussed with patient that her hyponatremia may be multifactorial from GI loss but also from recent addition of HCTZ.  She has already received 1 L of fluid and Zofran in triage.  We will give her her home dose of Xanax.  Will discuss with medicine for admission.   MEDICATIONS GIVEN IN ED: Medications  0.9 %  sodium chloride infusion ( Intravenous New Bag/Given 03/14/22 0415)  HYDROcodone-acetaminophen (NORCO/VICODIN) 5-325 MG per tablet 1 tablet (has no administration in time range)  amLODipine (NORVASC) tablet 5 mg (has no administration in time range)  atorvastatin (LIPITOR) tablet 10 mg (10 mg Oral Not Given 03/14/22 0545)  ALPRAZolam Duanne Moron) tablet 1 mg (has no administration in time range)  hydrOXYzine (ATARAX) tablet 25 mg (has no administration in time range)  sertraline (ZOLOFT) tablet 50 mg (has no administration in time range)  glucagon (human recombinant) (GLUCAGEN) injection 1 mg (has no administration in time range)  pantoprazole (PROTONIX) EC tablet 40 mg (has no administration in time range)  gabapentin (NEURONTIN) capsule 300 mg (has no administration in time range)  rOPINIRole (REQUIP) tablet 1 mg (has no administration in time range)  cholecalciferol (VITAMIN D3) 25 MCG (1000 UNIT) tablet 2,000 Units (has no administration in time range)  multivitamin with minerals tablet 1 tablet (has no administration in time range)  tiotropium (SPIRIVA) inhalation capsule ([facility] use ONLY) 18 mcg (has no administration in  time range)  albuterol (PROVENTIL) (2.5 MG/3ML) 0.083% nebulizer solution 2.5 mg (has no administration in time range)  enoxaparin (LOVENOX) injection 40 mg (has no administration in time range)  0.9 %  sodium chloride infusion ( Intravenous Not Given 03/14/22 0421)  acetaminophen (TYLENOL) tablet 650 mg (has no administration in time range)    Or  acetaminophen (TYLENOL) suppository 650 mg (has no administration in time range)  traZODone (DESYREL) tablet 25 mg (has no  administration in time range)  magnesium hydroxide (MILK OF MAGNESIA) suspension 30 mL (has no administration in time range)  ondansetron (ZOFRAN) tablet 4 mg (has no administration in time range)    Or  ondansetron (ZOFRAN) injection 4 mg (has no administration in time range)  magnesium sulfate IVPB 2 g 50 mL (2 g Intravenous New Bag/Given 03/14/22 0544)  ondansetron (ZOFRAN) injection 4 mg (4 mg Intravenous Given 03/13/22 2217)  lactated ringers bolus 1,000 mL (0 mLs Intravenous Stopped 03/14/22 0148)  sodium chloride 0.9 % bolus 1,000 mL (0 mLs Intravenous Stopped 03/14/22 0415)  ALPRAZolam (XANAX) tablet 1 mg (1 mg Oral Given 03/14/22 0154)     ED COURSE: Magnesium level also low at 1.6.  Hospitalist has ordered IV replacement.   CONSULTS:  Consulted and discussed patient's case with hospitalist, Dr. Sidney Ace.  I have recommended admission and consulting physician agrees and will place admission orders.  Patient (and family if present) agree with this plan.   I reviewed all nursing notes, vitals, pertinent previous records.  All labs, EKGs, imaging ordered have been independently reviewed and interpreted by myself.    OUTSIDE RECORDS REVIEWED: Reviewed patient's previous rheumatology note on 01/11/2021.       FINAL CLINICAL IMPRESSION(S) / ED DIAGNOSES   Final diagnoses:  Hyponatremia  Nausea vomiting and diarrhea  Hypokalemia  Hypomagnesemia     Rx / DC Orders   ED Discharge Orders     None        Note:  This document was prepared using Dragon voice recognition software and may include unintentional dictation errors.   Amando Chaput, Delice Bison, DO 03/14/22 [phone]

## 2022-03-14 NOTE — TOC Initial Note (Signed)
Transition of Care [facility]) - Initial/Assessment Note    Patient Details  Name: [name] MRN: [id number] Date of Birth: 21-Nov-1975  Transition of [facility]) CM/SW Contact:    Laurena Slimmer, RN Phone Number: 03/14/2022, 10:34 PM  Clinical Narrative:                  Transition of Care [facility]) Screening Note   Patient Details  Name: [name] Date of Birth: [birth year]   Transition of Care [facility]) CM/SW Contact:    Laurena Slimmer, RN Phone Number: 03/14/2022, 10:34 PM    Transition of Care Department [facility]) has reviewed patient and no TOC needs have been identified at this time. We will continue to monitor patient advancement through interdisciplinary progression rounds. If new patient transition needs arise, please place a TOC consult.          Patient Goals and CMS Choice        Expected Discharge Plan and Services                                                Prior Living Arrangements/Services                       Activities of Daily Living Home Assistive Devices/Equipment: None ADL Screening (condition at time of admission) Patient's cognitive ability adequate to safely complete daily activities?: Yes Is the patient deaf or have difficulty hearing?: No Does the patient have difficulty seeing, even when wearing glasses/contacts?: No Does the patient have difficulty concentrating, remembering, or making decisions?: No Patient able to express need for assistance with ADLs?: Yes Does the patient have difficulty dressing or bathing?: No Independently performs ADLs?: Yes (appropriate for developmental age) Does the patient have difficulty walking or climbing stairs?: No Weakness of Legs: None Weakness of Arms/Hands: None  Permission Sought/Granted                  Emotional Assessment              Admission diagnosis:  Hypokalemia [E87.6] Hypomagnesemia [E83.42] Hyponatremia [E87.1] Nausea vomiting and  diarrhea [R11.2, R19.7] Patient Active Problem List   Diagnosis Date Noted   Hyponatremia 03/14/2022   Acute gastroenteritis 03/14/2022   Uncontrolled type 2 diabetes mellitus with hyperglycemia, with long-term current use of insulin ([place]) 03/14/2022   Essential hypertension 03/14/2022   Uncontrolled type 1 diabetes mellitus with hyperglycemia, with long-term current use of insulin ([place]) 03/14/2022   Vitreous hemorrhage of right eye ([place]) 08/16/2021   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 associated with type 1 diabetes mellitus ([place]) 08/16/2021   Hypertension 03/03/2020   Primary osteoarthritis of both knees 11/30/2019   Primary osteoarthritis of both feet 11/30/2019   Primary osteoarthritis of both hands 11/30/2019   Severe nonproliferative diabetic retinopathy of right eye, without macular edema, associated with type 1 diabetes mellitus ([place]) 11/04/2019   Severe nonproliferative diabetic retinopathy of left eye, without macular edema, associated with type 1 diabetes mellitus ([place]) 11/04/2019   Retinal hemorrhage of right eye 11/04/2019   Chronic pain syndrome 11/13/2018   Dizziness 09/18/2018   Arthralgia 08/14/2018   Mild intermittent asthma without complication 89[birth year]1941   Snoring 08/12/2018   Breast lump 08/12/2018   Lung nodule 08/12/2018   Chest wall pain 08/02/2018   Eczema  08/02/2018   Diabetic peripheral neuropathy ([place]) 01/11/2018   Low back pain with left-sided sciatica 11/29/2017   Peripheral polyneuropathy 11/29/2017   Multinodular goiter 07/29/2015   Type 1 diabetes mellitus with neurological manifestations, uncontrolled 01/27/2014   Restless leg 11/07/2013   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one: [phone]: [phone] Address) - [place] Previously: [place] [address]hone: [phone]: [phone] [address] [place] [address] [address]one: [phone]: [id number] - [id number] [place]one: [phone]: [phone]  Social Determinants of Health (SDOH) Interventions    Readmission Risk Interventions     No data to display

## 2022-03-14 NOTE — H&P (Addendum)
[place]   PATIENT NAME: [name]    MR#:  [id number]  DATE OF BIRTH:  October 01, 1975  DATE OF ADMISSION:  03/14/2022  PRIMARY CARE PHYSICIAN: Tilda Franco, FNP   Patient is coming from: Home  REQUESTING/REFERRING PHYSICIAN: Ward, Delice Bison, DO  CHIEF COMPLAINT:   Chief Complaint  Patient presents with   Emesis    HISTORY OF PRESENT ILLNESS:  [name] is a 46 y.o. female with medical history significant for asthma, depression, type 1 diabetes mellitus, GERD, hyperlipidemia and osteoarthritis, who presented to the emergency room with onset of intractable nausea and vomiting with associated diarrhea over the last couple of days.  No bilious vomitus or hematemesis.  No melena or bright red bleeding per rectum she admitted to tactile fever and chills.  She was having lower abdominal pain as well as admitted to headache without dizziness or blurred vision.  She stated that she has not had any food intake over the last couple of days.  She was fairly emotional as her husband is dying from non-small cell lung cancer and her daughter is getting married soon.  She denies any chest pain or palpitations.  No cough or wheezing or dyspnea.  ED Course: Upon presentation to the ER, BP was 134/74 with otherwise normal vital signs..  Blood gas showed a pH 7.4 and HCO3 of 19.2 and anion gap was 17 and later 11 with a blood glucose of 215 and sodium of 120 and chloride of 83.  Magnesium was 1.6.  High sensitive troponin was 6 and later 5.  CBC showed etc. 12.7 and hemoconcentration.  TSH was 0.31 and UA showed more than 500 glucose with 20 ketones, many bacteria with 0-5 WBCs and 0-5 RBCs. EKG as reviewed by me :  EKG showed sinus tachycardia with a rate of 114 with biatrial enlargement and septal Q waves.  The patient was given 1 mg p.o. Xanax, 1 L bolus of IV lactated Ringer, 4 mg of IV Zofran and 1 L bolus of IV normal saline.  The patient will be admitted to a medical bed  for further evaluation and management. PAST MEDICAL HISTORY:   Past Medical History:  Diagnosis Date   Anxiety state, unspecified 03/26/2009   ASTHMA 02/02/2007   Asthma    DEPRESSION 02/16/2009   DIABETES MELLITUS, TYPE I 02/02/2007   Endometriosis    GERD 12/21/2008   Herpes    at 18   Hyperlipidemia    Lesion of vocal cord    Osteoarthritis    Severe nonproliferative diabetic retinopathy of right eye, without macular edema, associated with type 1 diabetes mellitus ([place]) 11/04/2019   Shortness of breath 09/25/2007   Vocal fold leukoplakia     PAST SURGICAL HISTORY:   Past Surgical History:  Procedure Laterality Date   ABDOMINAL HYSTERECTOMY  2009   BSO   DIRECT LARYNGOSCOPY N/A 08/17/2017   Procedure: DIRECT LARYNGOSCOPY WITH OPERATING TELESCOPE WITH BIOPSY;  Surgeon: Helayne Seminole, MD;  Location: [facility];  Service: ENT;  Laterality: N/A;   NASOPHARYNGEAL BIOPSY Right 08/17/2017   Procedure: NASOPHARYNGEAL BIOPSY RIGHT NASAL LESION;  Surgeon: Helayne Seminole, MD;  Location: [facility];  Service: ENT;  Laterality: Right;    SOCIAL HISTORY:   Social History   Tobacco Use   Smoking status: Every Day    Packs/day: 1.50    Years: 25.00    Total pack years: 37.50    Types: Cigarettes  Smokeless tobacco: Never   Tobacco comments:    11/03/2021 -  states she smokes a pack and a half a day  Substance Use Topics   Alcohol use: Not Currently    Alcohol/week: 0.0 standard drinks of alcohol    FAMILY HISTORY:   Family History  Problem Relation Age of Onset   Diabetes Mother    Diabetes Maternal Aunt    Emphysema Maternal Uncle    Diabetes Maternal Grandfather    Lung cancer Maternal Aunt    Colon cancer Neg Hx    Kidney disease Neg Hx    Liver disease Neg Hx    Esophageal cancer Neg Hx    Rectal cancer Neg Hx    Stomach cancer Neg Hx     DRUG ALLERGIES:  No Known Allergies  REVIEW OF SYSTEMS:   ROS As per history of present illness. All pertinent systems were  reviewed above. Constitutional, HEENT, cardiovascular, respiratory, GI, GU, musculoskeletal, neuro, psychiatric, endocrine, integumentary and hematologic systems were reviewed and are otherwise negative/unremarkable except for positive findings mentioned above in the HPI.   MEDICATIONS AT HOME:   Prior to Admission medications   Medication Sig Start Date End Date Taking? Authorizing Provider  ALPRAZolam Duanne Moron) 1 MG tablet Take 1 mg by mouth 4 (four) times daily.   Yes [provider]  amLODipine (NORVASC) 5 MG tablet Take 1 tablet (5 mg total) by mouth daily. Please make follow up appointment for future refills.TAKE 1 TABLET (5 MG TOTAL) BY MOUTH DAILY. 09/01/21 03/14/22 Yes Elouise Munroe, MD  atorvastatin (LIPITOR) 10 MG tablet Take 1 tablet (10 mg total) by mouth at bedtime. 09/26/18  Yes Luetta Nutting, DO  Cholecalciferol (VITAMIN D3) 50 MCG (2000 UT) capsule Take 2,000 Units by mouth daily.   Yes [provider]  Continuous Blood Gluc Sensor (DEXCOM G6 SENSOR) MISC 1 each by Does not apply route See admin instructions. Change sensor every 10 days; E10.9 03/07/22  Yes Elayne Snare, MD  Continuous Blood Gluc Transmit (DEXCOM G6 TRANSMITTER) MISC Use as instructed to check blood sugars, change every 3 months. 03/08/22  Yes Elayne Snare, MD  gabapentin (NEURONTIN) 300 MG capsule Take 1 capsule (300 mg total) by mouth 3 (three) times daily. As needed 09/26/18  Yes Luetta Nutting, DO  Multiple Vitamin (MULTIVITAMIN WITH MINERALS) TABS tablet Take 1 tablet by mouth daily.   Yes [provider]  NOVOLOG 100 UNIT/ML injection INJECT 40 UNITS DAILY PER INSULIN PUMP (MAX DOSAGE) 08/01/21  Yes Renato Shin, MD  omeprazole (PRILOSEC) 40 MG capsule TAKE 1 CAPSULE BY MOUTH 2 TIMES DAILY. 11/30/21  Yes Willia Craze, NP  rOPINIRole (REQUIP) 1 MG tablet TAKE 1 TABLET BY MOUTH AT BEDTIME (PLZ SCHED APPT WITH NEW PROVIDER)  Please make appointment with Dr. Rex Kras.                  Needs to make appointment with Dr. Rex Kras. Patient taking differently: Take 1 mg by mouth at bedtime. TAKE 1 TABLET BY MOUTH AT BEDTIME (PLZ SCHED APPT WITH NEW PROVIDER)  Please make appointment with Dr. Rex Kras.                 Needs to make appointment with Dr. Rex Kras. 01/26/20  Yes [name], MD  sertraline (ZOLOFT) 25 MG tablet Take 50 mg by mouth daily. 02/07/22  Yes [provider]  Tiotropium Bromide Monohydrate (SPIRIVA RESPIMAT) 2.5 MCG/ACT AERS Inhale 2 puffs into the lungs daily. 11/03/21  Yes Margaretha Seeds, MD  glucagon 1 MG injection Inject 1 mg into the muscle once as needed. 01/05/20   Renato Shin, MD  HYDROcodone-acetaminophen (NORCO/VICODIN) 5-325 MG tablet Take 1 tablet by mouth 3 (three) times daily as needed. 03/11/22   [provider]  hydrOXYzine (ATARAX) 25 MG tablet Take 25 mg by mouth daily as needed. 02/07/22   [provider]  Ibuprofen (ADVIL PO) Take 2-3 tablets by mouth every 6 (six) hours as needed.    [provider]  ipratropium (ATROVENT HFA) 17 MCG/ACT inhaler Inhale 2 puffs into the lungs every 4 (four) hours as needed for wheezing. 11/03/21   Margaretha Seeds, MD  VENTOLIN HFA 108 [phone] Base) MCG/ACT inhaler SMARTSIG:2 Puff(s) By Mouth Every 4 Hours PRN 01/14/22   [provider]      VITAL SIGNS:  Blood pressure (!) 140/74, pulse (!) 113, temperature 98.7 F (37.1 C), temperature source Oral, resp. rate 20, height '5\' 5"'$  (1.651 m), weight 54.4 kg, SpO2 95 %.  PHYSICAL EXAMINATION:  Physical Exam  GENERAL:  46 y.o.-year-old patient lying in the bed with no acute distress.  EYES: Pupils equal, round, reactive to light and accommodation. No scleral icterus. Extraocular muscles intact.  HEENT: Head atraumatic, normocephalic. Oropharynx and nasopharynx clear.  NECK:  Supple, no jugular venous distention. No thyroid enlargement, no tenderness.  LUNGS: Normal  breath sounds bilaterally, no wheezing, rales,rhonchi or crepitation. No use of accessory muscles of respiration.  CARDIOVASCULAR: Regular rate and rhythm, S1, S2 normal. No murmurs, rubs, or gallops.  ABDOMEN: Soft, nondistended, nontender. Bowel sounds present. No organomegaly or mass.  EXTREMITIES: No pedal edema, cyanosis, or clubbing.  NEUROLOGIC: Cranial nerves II through XII are intact. Muscle strength 5/5 in all extremities. Sensation intact. Gait not checked.  PSYCHIATRIC: The patient is alert and oriented x 3.  Normal affect and good eye contact. SKIN: No obvious rash, lesion, or ulcer.   LABORATORY PANEL:   CBC Recent Labs  Lab 03/14/22 0417  WBC 8.8  HGB 13.7  HCT 38.7  PLT 304   ------------------------------------------------------------------------------------------------------------------  Chemistries  Recent Labs  Lab 03/13/22 2204 03/14/22 0151 03/14/22 0417  NA 120*  --  124*  K 3.9  --  3.9  CL 83*  --  94*  CO2 20*  --  19*  GLUCOSE 215*  --  198*  BUN 9  --  8  CREATININE 0.77  --  0.59  CALCIUM 9.9  --  8.4*  MG  --  1.6*  --   AST 16  --   --   ALT 11  --   --   ALKPHOS 87  --   --   BILITOT 1.2  --   --    ------------------------------------------------------------------------------------------------------------------  Cardiac Enzymes No results for input(s): "TROPONINI" in the last 168 hours. ------------------------------------------------------------------------------------------------------------------  RADIOLOGY:  No results found.    IMPRESSION AND PLAN:  Assessment and Plan: * Hyponatremia - The patient will be admitted to a medical bed. - We will continue hydration with IV normal saline. - We will obtain hyponatremia work-up. - We will follow BMPs.  Acute gastroenteritis - The patient will be hydrated with IV normal saline. - We will utilize as needed antiemetics and antidiarrheals.  Uncontrolled type 1 diabetes  mellitus with hyperglycemia, with long-term current use of insulin ([place]) - She is having mild resolving DKA. - She will be placed on resistant subcutaneous NovoLog and will continue her insulin pump. - We  will continue aggressive hydration with IV normal saline. - We will follow BMPs.  Essential hypertension - We will continue antihypertensives  Peripheral polyneuropathy - We will continue Neurontin.  GERD - The patient will be placed on IV PPI therapy    DVT prophylaxis: Lovenox.  Advanced Care Planning:  Code Status: full code.  Family Communication:  The plan of care was discussed in details with the patient (and family). I answered all questions. The patient agreed to proceed with the above mentioned plan. Further management will depend upon hospital course. Disposition Plan: Back to previous home environment Consults called: none.  All the records are reviewed and case discussed with ED provider.  Status is: Inpatient   At the time of the admission, it appears that the appropriate admission status for this patient is inpatient.  This is judged to be reasonable and necessary in order to provide the required intensity of service to ensure the patient's safety given the presenting symptoms, physical exam findings and initial radiographic and laboratory data in the context of comorbid conditions.  The patient requires inpatient status due to high intensity of service, high risk of further deterioration and high frequency of surveillance required.  I certify that at the time of admission, it is my clinical judgment that the patient will require inpatient hospital care extending more than 2 midnights.                            Dispo: The patient is from: Home              Anticipated d/c is to: Home              Patient currently is not medically stable to d/c.              Difficult to place patient: No  Christel Mormon M.D on 03/14/2022 at 6:17 AM  [facility]   From 7  PM-7 AM, contact night-coverage [email or web address]  CC: Primary care physician; Tilda Franco, FNP

## 2022-03-14 NOTE — Telephone Encounter (Signed)
Pharmacy Patient Advocate Encounter  Insurance verification completed.    The patient is insured through [employer] [facility] [employer]   The patient is currently admitted and ran test claims for the following: Lantus Pens.  Copays and coinsurance results were relayed to Inpatient clinical team.

## 2022-03-14 NOTE — Hospital Course (Addendum)
Taken from H&P.  [name] is a 46 y.o. female with medical history significant for asthma, depression, type 1 diabetes mellitus, GERD, hyperlipidemia and osteoarthritis, who presented to the emergency room with onset of intractable nausea and vomiting with associated diarrhea over the last couple of days.  No bilious vomitus or hematemesis.  No melena or bright red bleeding per rectum she admitted to tactile fever and chills.  She was having lower abdominal pain as well as admitted to headache without dizziness or blurred vision.  She stated that she has not had any food intake over the last couple of days.  She was fairly emotional as her husband is dying from non-small cell lung cancer and her daughter is getting married soon.  She denies any chest pain or palpitations.  No cough or wheezing or dyspnea.   ED Course: Upon presentation to the ER, BP was 134/74 with otherwise normal vital signs..  Blood gas showed a pH 7.4 and HCO3 of 19.2 and anion gap was 17 and later 11 with a blood glucose of 215 and sodium of 120 and chloride of 83.  Magnesium was 1.6.  High sensitive troponin was 6 and later 5.  CBC showed etc. 12.7 and hemoconcentration.  TSH was 0.31 and UA showed more than 500 glucose with 20 ketones, many bacteria with 0-5 WBCs and 0-5 RBCs. EKG as reviewed by me :  EKG showed sinus tachycardia with a rate of 114 with biatrial enlargement and septal Q waves.   The patient was given 1 mg p.o. Xanax, 1 L bolus of IV lactated Ringer, 4 mg of IV Zofran and 1 L bolus of IV normal saline.  8/29: Sodium with slight improvement to 124, hyponatremia labs with low serum and urine osmolality and urinary sodium of 14.  TSH was low at 0.313.  Checking free T4 Most likely secondary to GI losses. Patient has an history of chronic mild hyponatremia, she is on Zoloft-need to discuss with her PCP regarding the need. We will continue to monitor sodium and giving some IV fluid. Patient with labile CBG.   Diabetes coordinator checked her insulin pump settings and found them unsafe.  She had an upcoming appointment with her endocrinologist next week.  We decided to discontinue insulin pump at this time, added Semglee along with SSI and mealtime coverage.

## 2022-03-14 NOTE — Assessment & Plan Note (Signed)
>>  ASSESSMENT AND PLAN FOR ANXIETY WRITTEN ON 03/14/2022  2:02 PM BY AMIN, SUMAYYA, MD  Patient was on Xanax  for many years and apparently ran out of her prescription for the past 3 days. -Restart Xanax  at a lower dose -She should work with PCP for a slow taper

## 2022-03-14 NOTE — Assessment & Plan Note (Signed)
-   The patient will be placed on IV PPI therapy

## 2022-03-14 NOTE — Assessment & Plan Note (Deleted)
-   The patient is having improving minimal DKA. - We will continue aggressive hydration with IV normal saline. - He will be placed on resistant substance NovoLog with frequent fingerstick blood glucose measures.

## 2022-03-14 NOTE — Inpatient Diabetes Management (Addendum)
Inpatient Diabetes Program Recommendations  AACE/ADA: New Consensus Statement on Inpatient Glycemic Control (2015)  Target Ranges:  Prepandial:   less than 140 mg/dL      Peak postprandial:   less than 180 mg/dL (1-2 hours)      Critically ill patients:  140 - 180 mg/dL   Lab Results  Component Value Date   GLUCAP 75 03/14/2022   HGBA1C 8.6 (A) 11/09/2021    Review of Glycemic Control  Latest Reference Range & Units 03/14/22 07:28 03/14/22 09:05 03/14/22 09:56  Glucose-Capillary 70 - 99 mg/dL 278 (H)  Novolog 15 units  167 (H)  Novolog 4 units 75  (H): Data is abnormally high  Latest Reference Range & Units 03/14/22 04:17  CO2 22 - 32 mmol/L 19 (L)  (L): Data is abnormally low  Latest Reference Range & Units 03/14/22 04:17  Anion gap 5 - 15  11    Diabetes history: DM1  Outpatient Diabetes medications:  Insulin Pump with Novolog  T-Slim and Dexcom  Basal settings: 0.2 units per hr for a total of 4.8 units/24Hr Insulin carb ratio:  1 unit for every 1 carb  Insulin sensitivity factor: 1 units: 600 mg/dL (1 unit drops her 600 mg/dL) Target: 110  Current orders for Inpatient glycemic control:  Novolog 0-20 units Q2H  Inpatient Diabetes Program Recommendations:    Patient receiving resistant correction Q2H. Began feeling shaky and her CGM began to alarm for hypoglycemia.  RN gave her some juice.    Anion Gap is closed.  Spoke with patient on the phone.  She is starting to feel better.  Please have patient place insulin pump back on with a new site and discontinue SQ insulin.    She has a new site and insulin with her.  Her last endocrinology visit was on 11/09/21 with Dr. [name].  He is no longer with the practice and she has a scheduled appointment for the end of September with the same practice.  She denies low blood sugars because she is able to catch hypoglycemia with her CGM and treat.  She states she changes her insulin pump site every 5 days and her Dexcom every  10 days.  Encouraged her to change her site no longer than every 3 days. She verbalizes understanding.    She was diagnosed with type 1 DM at age 46 or 2.  Will see patient and obtain current pump settings.    Addendum'@13'$ :44:  Spoke with patient at bedside.  Reviewed insulin pump settings.  The current settings as listed above are unsafe.  She is eating a [place] sandwich and has bolused 0.25 units (this is not nearly enough).  She thankfully does not enter carbs for boluses when she eats as her settings are 1:1.  She does bolus for meals by guessing how much insulin to enter.  She has frequent lows and often has to remove her pump.  She states she eats candy all day long to keep her blood sugar up.  After reviewing her pump setting I would recommend removing pump, starting Semglee 10 units QD, Novolog 0-9 TID and 0-5 QHS and Novolog 2 units meal coverage TID if consumes at least 50%.  Do not use pump until she sees her new endocrinologist in September.    Called and left a message with DM educator at the [facility].  Will attempt to have her appointment changed to a closer date. Discussed with patient and she verbalizes understanding.    For DC  will need:  Novolog Flexpen insulin pen-Order # [id number] Lantus Solostar insulin pen-Order # [id number] Insulin pen needles-Order # [id number]  Benefit check completed  Addendum'@15'$ :05: Bernie Covey, CDCES returned my phone call.  Explained current pump settings and she says they are incorrect.  Sent Dr. Dwyane Dee a secure & Vaughan Basta a secure chat asking for her to be seen sooner than April 17, 2022.  She needs further pump training.     Will follow up with patient tomorrow and review glucose trends.    Thank you, Reche Dixon, MSN, [place] Diabetes Coordinator Inpatient Diabetes Program [phone] (team pager from 8a-5p)

## 2022-03-14 NOTE — TOC Benefit Eligibility Note (Signed)
Patient Teacher, English as a foreign language completed.    The patient is currently admitted and upon discharge could be taking Lantus Pens.  The current 30 day co-pay is $4.00.   The patient is insured through [place], [place] Patient Advocate Specialist [place] Patient Advocate Team Direct Number: [phone]  Fax: [phone]

## 2022-03-15 ENCOUNTER — Encounter: Payer: Self-pay | Admitting: Family Medicine

## 2022-03-15 DIAGNOSIS — K529 Noninfective gastroenteritis and colitis, unspecified: Secondary | ICD-10-CM

## 2022-03-15 DIAGNOSIS — F419 Anxiety disorder, unspecified: Secondary | ICD-10-CM | POA: Diagnosis not present

## 2022-03-15 DIAGNOSIS — E1065 Type 1 diabetes mellitus with hyperglycemia: Secondary | ICD-10-CM

## 2022-03-15 DIAGNOSIS — E871 Hypo-osmolality and hyponatremia: Secondary | ICD-10-CM | POA: Diagnosis not present

## 2022-03-15 LAB — BASIC METABOLIC PANEL
Anion gap: 6 (ref 5–15)
Anion gap: 7 (ref 5–15)
Anion gap: 8 (ref 5–15)
BUN: 5 mg/dL — ABNORMAL LOW (ref 6–20)
BUN: 6 mg/dL (ref 6–20)
BUN: 8 mg/dL (ref 6–20)
CO2: 23 mmol/L (ref 22–32)
CO2: 24 mmol/L (ref 22–32)
CO2: 24 mmol/L (ref 22–32)
Calcium: 8.8 mg/dL — ABNORMAL LOW (ref 8.9–10.3)
Calcium: 8.8 mg/dL — ABNORMAL LOW (ref 8.9–10.3)
Calcium: 8.8 mg/dL — ABNORMAL LOW (ref 8.9–10.3)
Chloride: 102 mmol/L (ref 98–111)
Chloride: 104 mmol/L (ref 98–111)
Chloride: 98 mmol/L (ref 98–111)
Creatinine, Ser: 0.62 mg/dL (ref 0.44–1.00)
Creatinine, Ser: 0.65 mg/dL (ref 0.44–1.00)
Creatinine, Ser: 0.66 mg/dL (ref 0.44–1.00)
GFR, Estimated: >60 mL/min (ref >60)
GFR, Estimated: >60 mL/min (ref >60)
GFR, Estimated: >60 mL/min (ref >60)
Glucose, Bld: 246 mg/dL — ABNORMAL HIGH (ref 70–99)
Glucose, Bld: 279 mg/dL — ABNORMAL HIGH (ref 70–99)
Glucose, Bld: 369 mg/dL — ABNORMAL HIGH (ref 70–99)
Potassium: 3.5 mmol/L (ref 3.5–5.1)
Potassium: 3.6 mmol/L (ref 3.5–5.1)
Potassium: 3.9 mmol/L (ref 3.5–5.1)
Sodium: 130 mmol/L — ABNORMAL LOW (ref 135–145)
Sodium: 132 mmol/L — ABNORMAL LOW (ref 135–145)
Sodium: 134 mmol/L — ABNORMAL LOW (ref 135–145)

## 2022-03-15 LAB — GLUCOSE, CAPILLARY
Glucose-Capillary: 235 mg/dL — ABNORMAL HIGH (ref 70–99)
Glucose-Capillary: 257 mg/dL — ABNORMAL HIGH (ref 70–99)
Glucose-Capillary: 236 mg/dL — ABNORMAL HIGH (ref 70–99)

## 2022-03-15 MED ORDER — ALPRAZOLAM 0.5 MG PO TABS: 0.5000 mg | ORAL_TABLET | Freq: Two times a day (BID) | ORAL | 0 refills | Status: DC | PRN

## 2022-03-15 MED ORDER — INSULIN ASPART 100 UNIT/ML IJ SOLN: 0.0000 [IU] | Freq: Three times a day (TID) | INTRAMUSCULAR | Status: DC

## 2022-03-15 MED ORDER — INSULIN GLARGINE 100 UNIT/ML SOLOSTAR PEN: 10.0000 [IU] | PEN_INJECTOR | Freq: Every day | SUBCUTANEOUS | 0 refills | Status: DC

## 2022-03-15 MED ORDER — INSULIN ASPART 100 UNIT/ML FLEXPEN
2.0000 [IU] | PEN_INJECTOR | Freq: Three times a day (TID) | SUBCUTANEOUS | 0 refills | Status: DC
Start: 2022-03-15 — End: 2022-08-17

## 2022-03-15 MED ORDER — INSULIN GLARGINE-YFGN 100 UNIT/ML ~~LOC~~ SOLN
10.0000 [IU] | Freq: Every day | SUBCUTANEOUS | Status: DC
  Administered 2022-03-15: 10 [IU] via SUBCUTANEOUS
  Filled 2022-03-15: qty 0.1

## 2022-03-15 MED ORDER — PEN NEEDLES 32G X 5 MM MISC: 0 refills | Status: AC

## 2022-03-15 NOTE — Inpatient Diabetes Management (Signed)
Inpatient Diabetes Program Recommendations  AACE/ADA: New Consensus Statement on Inpatient Glycemic Control (2015)  Target Ranges:  Prepandial:   less than 140 mg/dL      Peak postprandial:   less than 180 mg/dL (1-2 hours)      Critically ill patients:  140 - 180 mg/dL   Lab Results  Component Value Date   GLUCAP 235 (H) 03/15/2022   HGBA1C 8.6 (A) 11/09/2021    Review of Glycemic Control  Latest Reference Range & Units 03/14/22 12:10 03/14/22 20:40 03/15/22 07:58  Glucose-Capillary 70 - 99 mg/dL 233 (H) 381 (H) 235 (H)  (H): Data is abnormally high   Latest Reference Range & Units 03/15/22 08:53  Glucose 70 - 99 mg/dL 369 (H)  (H): Data is abnormally high  Spoke with patient at bedside.  She will be discharging today.  Shari Heritage, CDCES is going to have her come into the office for education and pump adjustments.    Patient states she ate cupcakes and cookies overnight.  She is currently sitting in bed drinking apple juice.  Educated her on lifestyle modifications, diet modifications and important of glucose control.  Asked her to call Vaughan Basta for an appointment.  Will continue to follow while inpatient.  Thank you, Reche Dixon, MSN, [place] Diabetes Coordinator Inpatient Diabetes Program [phone] (team pager from 8a-5p)

## 2022-03-15 NOTE — Discharge Summary (Signed)
Physician Discharge Summary   Patient: [name] MRN: [id number] DOB: [birth year]  Admit date:     03/14/2022  Discharge date: 03/15/22  Discharge Physician: Jennye Boroughs   PCP: Tilda Franco, FNP   Recommendations at discharge:   Follow-up with PCP in 1 week  Discharge Diagnoses: Principal Problem:   Hyponatremia Active Problems:   Acute gastroenteritis   Uncontrolled type 1 diabetes mellitus with hyperglycemia, with long-term current use of insulin (HCC)   GERD   Anxiety   Peripheral polyneuropathy   Essential hypertension  Resolved Problems:   * No resolved hospital problems. *  Hospital Course:  [name] is a 46 y.o. female with medical history significant for asthma, depression, type 1 diabetes mellitus, GERD, hyperlipidemia and osteoarthritis, who presented to the emergency room with onset of intractable nausea and vomiting with associated diarrhea of about 2 days duration.  She also complained of abdominal pain.  She was feeling anxious and emotive because she said her husband was dying from lung cancer and her daughter was getting married soon.  She was admitted to the hospital with acute gastroenteritis, hyponatremia and hyperglycemia with possibly mild DKA.  She was treated with IV fluids, analgesics and antiemetics.  She was on insulin pump prior to admission but her insulin pump was deemed to be defective and unsafe for use.  She was treated with long-acting and short acting insulin in the hospital.  Her condition has improved and she is deemed stable for discharge to home.  She will be discharged on long-acting and short-acting insulin via insulin pen.  She will follow-up with her endocrinologist for further recommendation regarding management of her diabetes mellitus.  Follow-up with the PCP is also recommended for routine health maintenance.      Pain control - [employer] Controlled Substance Reporting System database was reviewed. and  patient was instructed, not to drive, operate heavy machinery, perform activities at heights, swimming or participation in water activities or provide baby-sitting services while on Pain, Sleep and Anxiety Medications; until their outpatient Physician has advised to do so again. Also recommended to not to take more than prescribed Pain, Sleep and Anxiety Medications.  Consultants: None Procedures performed: None Disposition: Home Diet recommendation:  Discharge Diet Orders (From admission, onward)     Start     Ordered   03/15/22 0000  Diet - low sodium heart healthy        03/15/22 0957   03/15/22 0000  Diet Carb Modified        03/15/22 0957           Cardiac and Carb modified diet DISCHARGE MEDICATION: Allergies as of 03/15/2022   No Known Allergies      Medication List     STOP taking these medications    NovoLOG 100 UNIT/ML injection Generic drug: insulin aspart Replaced by: insulin aspart 100 UNIT/ML FlexPen       TAKE these medications    ADVIL PO Take 2-3 tablets by mouth every 6 (six) hours as needed.   ALPRAZolam 0.5 MG tablet Commonly known as: XANAX Take 1 tablet (0.5 mg total) by mouth 2 (two) times daily as needed for anxiety. What changed:  medication strength how much to take when to take this reasons to take this   amLODipine 5 MG tablet Commonly known as: NORVASC Take 1 tablet (5 mg total) by mouth daily. Please make follow up appointment for future refills.TAKE 1 TABLET (5 MG TOTAL) BY MOUTH DAILY.  atorvastatin 10 MG tablet Commonly known as: LIPITOR Take 1 tablet (10 mg total) by mouth at bedtime.   Atrovent HFA 17 MCG/ACT inhaler Generic drug: ipratropium Inhale 2 puffs into the lungs every 4 (four) hours as needed for wheezing.   Dexcom G6 Sensor Misc 1 each by Does not apply route See admin instructions. Change sensor every 10 days; E10.9   Dexcom G6 Transmitter Misc Use as instructed to check blood sugars, change every 3  months.   gabapentin 300 MG capsule Commonly known as: Neurontin Take 1 capsule (300 mg total) by mouth 3 (three) times daily. As needed   glucagon 1 MG injection Inject 1 mg into the muscle once as needed.   HYDROcodone-acetaminophen 5-325 MG tablet Commonly known as: NORCO/VICODIN Take 1 tablet by mouth 3 (three) times daily as needed.   hydrOXYzine 25 MG tablet Commonly known as: ATARAX Take 25 mg by mouth daily as needed.   insulin aspart 100 UNIT/ML FlexPen Commonly known as: NOVOLOG Inject 2 Units into the skin 3 (three) times daily with meals. Replaces: NovoLOG 100 UNIT/ML injection   insulin aspart 100 UNIT/ML injection Commonly known as: novoLOG Inject 0-9 Units into the skin 3 (three) times daily with meals.   insulin glargine 100 UNIT/ML Solostar Pen Commonly known as: LANTUS Inject 10 Units into the skin daily.   multivitamin with minerals Tabs tablet Take 1 tablet by mouth daily.   omeprazole 40 MG capsule Commonly known as: PRILOSEC TAKE 1 CAPSULE BY MOUTH 2 TIMES DAILY.   rOPINIRole 1 MG tablet Commonly known as: REQUIP TAKE 1 TABLET BY MOUTH AT BEDTIME (PLZ SCHED APPT WITH NEW PROVIDER)  Please make appointment with Dr. Rex Kras.                 Needs to make appointment with Dr. Rex Kras. What changed:  how much to take how to take this when to take this   sertraline 25 MG tablet Commonly known as: ZOLOFT Take 50 mg by mouth daily.   Spiriva Respimat 2.5 MCG/ACT Aers Generic drug: Tiotropium Bromide Monohydrate Inhale 2 puffs into the lungs daily.   Ventolin HFA 108 (90 Base) MCG/ACT inhaler Generic drug: albuterol SMARTSIG:2 Puff(s) By Mouth Every 4 Hours PRN   Vitamin D3 50 MCG (2000 UT) capsule Take 2,000 Units by mouth daily.        Discharge Exam: Filed Weights   03/13/22 2201  Weight: 54.4 kg   GEN: NAD SKIN: No rash on exposed skin EYES: EOMI ENT: MMM CV: RRR PULM: CTA B ABD: soft, ND, NT, +BS CNS: AAO x  3, non focal EXT: No edema or tenderness   Condition at discharge: good  The results of significant diagnostics from this hospitalization (including imaging, microbiology, ancillary and laboratory) are listed below for reference.   Imaging Studies: No results found.  Microbiology: Results for orders placed or performed during the hospital encounter of 05/11/21  Resp Panel by RT-PCR (Flu A&B, Covid) Nasopharyngeal Swab     Status: None   Collection Time: 05/11/21  8:58 PM   Specimen: Nasopharyngeal Swab; Nasopharyngeal(NP) swabs in vial transport medium  Result Value Ref Range Status   SARS Coronavirus 2 by RT PCR NEGATIVE NEGATIVE Final    Comment: (NOTE) SARS-CoV-2 target nucleic acids are NOT DETECTED.  The SARS-CoV-2 RNA is generally detectable in upper respiratory specimens during the acute phase of infection. The lowest concentration of SARS-CoV-2 viral copies this assay can detect is 138 copies/mL. A negative result does  not preclude SARS-Cov-2 infection and should not be used as the sole basis for treatment or other patient management decisions. A negative result may occur with  improper specimen collection/handling, submission of specimen other than nasopharyngeal swab, presence of viral mutation(s) within the areas targeted by this assay, and inadequate number of viral copies(<138 copies/mL). A negative result must be combined with clinical observations, patient history, and epidemiological information. The expected result is Negative.  Fact Sheet for Patients:  EntrepreneurPulse.com.au  Fact Sheet for Healthcare Providers:  IncredibleEmployment.be  This test is no t yet approved or cleared by the [place] FDA and  has been authorized for detection and/or diagnosis of SARS-CoV-2 by FDA under an Emergency Use Authorization (EUA). This EUA will remain  in effect (meaning this test can be used) for the duration of the COVID-19  declaration under Section 564(b)(1) of the Act, 21 U.S.C.section 360bbb-3(b)(1), unless the authorization is terminated  or revoked sooner.       Influenza A by PCR NEGATIVE NEGATIVE Final   Influenza B by PCR NEGATIVE NEGATIVE Final    Comment: (NOTE) The Xpert Xpress SARS-CoV-2/FLU/RSV plus assay is intended as an aid in the diagnosis of influenza from Nasopharyngeal swab specimens and should not be used as a sole basis for treatment. Nasal washings and aspirates are unacceptable for Xpert Xpress SARS-CoV-2/FLU/RSV testing.  Fact Sheet for Patients: EntrepreneurPulse.com.au  Fact Sheet for Healthcare Providers: IncredibleEmployment.be  This test is not yet approved or cleared by the [place] FDA and has been authorized for detection and/or diagnosis of SARS-CoV-2 by FDA under an Emergency Use Authorization (EUA). This EUA will remain in effect (meaning this test can be used) for the duration of the COVID-19 declaration under Section 564(b)(1) of the Act, 21 U.S.C. section 360bbb-3(b)(1), unless the authorization is terminated or revoked.  Performed at [facility], [address] [place], [place] [address]     Labs: CBC: Recent Labs  Lab 03/13/22 2204 03/14/22 0417  WBC 12.7* 8.8  HGB 16.3* 13.7  HCT 45.7 38.7  MCV 84.6 84.1  PLT 341 604   Basic Metabolic Panel: Recent Labs  Lab 03/14/22 0151 03/14/22 0417 03/14/22 1402 03/14/22 2037 03/15/22 0029 03/15/22 0442 03/15/22 0853  NA  --    < > 129* 130* 132* 134* 130*  K  --    < > 3.9 3.7 3.9 3.6 3.5  CL  --    < > 102 100 102 104 98  CO2  --    < > 21* 20* '23 24 24  '$ GLUCOSE  --    < > 300* 360* 279* 246* 369*  BUN  --    < > '8 8 8 6 '$ 5*  CREATININE  --    < > 0.82 0.67 0.66 0.65 0.62  CALCIUM  --    < > 8.5* 8.9 8.8* 8.8* 8.8*  MG 1.6*  --   --   --   --   --   --    < > = values in this interval not displayed.   Liver Function Tests: Recent Labs   Lab 03/13/22 2204  AST 16  ALT 11  ALKPHOS 87  BILITOT 1.2  PROT 8.7*  ALBUMIN 4.8   CBG: Recent Labs  Lab 03/14/22 0905 03/14/22 0956 03/14/22 1210 03/14/22 2040 03/15/22 0758  GLUCAP 167* 75 233* 381* 235*    Discharge time spent: greater than 30 minutes.  Signed: Jennye Boroughs, MD [facility] 03/15/2022

## 2022-03-16 ENCOUNTER — Telehealth: Payer: Self-pay | Admitting: Dietician

## 2022-03-16 ENCOUNTER — Telehealth: Payer: Self-pay | Admitting: Endocrinology

## 2022-03-16 DIAGNOSIS — E1142 Type 2 diabetes mellitus with diabetic polyneuropathy: Secondary | ICD-10-CM

## 2022-03-16 MED ORDER — ACCU-CHEK GUIDE ME W/DEVICE KIT: PACK | 0 refills | Status: AC

## 2022-03-16 NOTE — Telephone Encounter (Signed)
Rx sent in to pharmacy and patient notified. Keytone strips she will purchase over the counter.

## 2022-03-16 NOTE — Telephone Encounter (Signed)
MEDICATION: Accu Chek Meter And also some keytone strips  PHARMACY:  [id number] [place] [address](Ph: [phone]  HAS THE PATIENT CONTACTED THEIR PHARMACY?  no  IS THIS A 90 DAY SUPPLY : no  IS PATIENT OUT OF MEDICATION:   IF NOT; HOW MUCH IS LEFT:   LAST APPOINTMENT DATE: '@4'$ /26/2023 ([name]  NEXT APPOINTMENT DATE:'@10'$ /11/2021  DO WE HAVE YOUR PERMISSION TO LEAVE A DETAILED MESSAGE?:  OTHER COMMENTS: Patient states that her meter is not working correctly (the numbers are "jumping around") and she she is out of the strips completely.   **Let patient know to contact pharmacy at the end of the day to make sure medication is ready. **  ** Please notify patient to allow 48-72 hours to process**  **Encourage patient to contact the pharmacy for refills or they can request refills through [facility]*

## 2022-03-16 NOTE — Telephone Encounter (Signed)
Front desk to take care of scheduling.  There may be a cancellation next week

## 2022-03-16 NOTE — Telephone Encounter (Signed)
Returned patient call. Patient admitted with DKA 8/29 and discharged yesterday.  She is currently returning from the pharmacy with blood glucose strips and ketone strips.  She was told she should not use her pump until the settings are evaluated and is now on a basal bolus schedule.  She asks how to disconnect the dexcom from the pump.  She is not using the pump. Discussed for her to call Tech support with [employer] who should be able to assist and will have Vaughan Basta call her next week.  She dislikes the pump and wants to know if she can change.  Will message her endocrinologist to see if he has an earlier appointment as her scheduled appointment is 04/20/22.  Patient states that she needs updated pump settings.  Patient verbalizes increased stress related to pump, daughter's wedding this Saturday, and her husband is dying of cancer. Emotional support provided.  Antonieta Iba, RD, LDN, CDCES

## 2022-03-17 ENCOUNTER — Telehealth: Payer: Self-pay | Admitting: Endocrinology

## 2022-03-17 NOTE — Telephone Encounter (Signed)
I called patient to reschedule her October 08/2021 and 04/20/2022 appointments to an earlier date per Dr. Ronnie Derby request.  The patient stated that she could not do this at this time because her husband had been diagnosed with cancer and she was waiting for a phone call from his doctor.  Patient stated that she would call back at a later time to reschedule.

## 2022-03-20 ENCOUNTER — Other Ambulatory Visit: Payer: Self-pay | Admitting: Endocrinology

## 2022-03-20 DIAGNOSIS — E1065 Type 1 diabetes mellitus with hyperglycemia: Secondary | ICD-10-CM

## 2022-03-20 DIAGNOSIS — E871 Hypo-osmolality and hyponatremia: Secondary | ICD-10-CM

## 2022-03-21 ENCOUNTER — Telehealth: Payer: Self-pay | Admitting: Nutrition

## 2022-03-21 NOTE — Telephone Encounter (Signed)
Patient says that since leaving the hospital, and going back on injections, blood sugars have been in the 300-400s.  2 days ago she did what they said to do and took 6u acS. One hour   after eating supper, blood sugar dropped to 23 and she ate "everything in my kitchen" and blood sugar was still only 30 after  45 minutes.  EMS was called and IV glucose was given.  She is frantic and wanting to go back on her pump.   Appointment was made for tomorrow at 4:30PM.  She says that blood sugars never went over 220 on her pump (except when she got the flu), and that she can control it much better on her pump than on these injections.

## 2022-03-22 ENCOUNTER — Encounter: Payer: Medicaid Other | Attending: Endocrinology | Admitting: Nutrition

## 2022-03-22 DIAGNOSIS — Z794 Long term (current) use of insulin: Secondary | ICD-10-CM | POA: Insufficient documentation

## 2022-03-22 DIAGNOSIS — E1165 Type 2 diabetes mellitus with hyperglycemia: Secondary | ICD-10-CM | POA: Diagnosis present

## 2022-03-23 NOTE — Progress Notes (Signed)
Patient is here to restart her pump from being in the hospital for DKA She reports that her blood sugar never got over 250, and she suddenly began to feel sick and start vomiting.  She is wearing a tandem control IQ pump with dexcom sensor.  Pt. Under extreme stress with husband dyeing of Cancer, and no one to help.  Says he is very hard to handle now due to inability to sleep, wondering around the house, not eating, and has bouts of delirium and inability to communicate .  She reports not having slept in 3 days.  Says gets some help from son, but he works, and patient's brother, who is with him now.  Settings were put into the pump per Dr. Cordelia Pen last office note:  Basal rate: 0.2u/hr, I/C: 1 (patient is bolusing in units not carb-2 for breakfast, 2-3 for lunch and 3 for supper), ISF: 200.--Patient says that she has changed this to 600, due blood sugars dropping extremely low when she does a correction.  She reports that if we keep it at this level, she will only due 1/3 of the correction dose.  Says when she is 250, she will take .15 units of insulin and blood sugar will be down to low 100s in 4 hours.  She changed the ISF to 600.  She was put into control IQ, and settings were changed  in this setting, due to weight loss and tdd reduction.   She immediately put the pump into exercise mode, because she says this helps to prevent her blood sugar from dropping low better.  She reports still having lows during this time, but only about 1 daily, at varing times of day. We discussed need to make sure all meals have protein and 2 servings of carbohydrate.  We reviewed what that means in forms of serving sizes.  Diet review shows her eating at varying times, due to husband's condition, but eating 3 meals, and snacks grapes if dropping low.  Suggestions for quick protein choices that are easy to get to, like cheese and peanut butter.  Says peanut butter and jelly sandwich is go to lunch for her, with diet drink.   Has fruit at supper and snack mid day when active to prevent lows.  Discussed high blood sugar protocol, and sick day guidelines, and the need to change infusion sets when blood sugar does not come down in 2 hours after correction dose given.  She reported good understanding of this, and had no final questions. Dexcom clarity linked to [employer], and T-connect app is on, with verbal note to make sure this is always running.   Pt. Very grateful to be back on pump and agreed to call me on Monday, or sooner, if blood sugars go high or low.

## 2022-03-23 NOTE — Patient Instructions (Signed)
Please review high blood sugar protocol and sick day guidelines given. Call if blood sugars drop low or remain over 220.

## 2022-03-27 ENCOUNTER — Telehealth: Payer: Self-pay | Admitting: Nutrition

## 2022-03-27 NOTE — Telephone Encounter (Signed)
Left message to call me back to let me know how she is doing on her pump. Gave number to call me back

## 2022-03-29 ENCOUNTER — Telehealth: Payer: Self-pay | Admitting: Nutrition

## 2022-03-29 NOTE — Telephone Encounter (Signed)
LVM to call me to let me know how she is doing back on her pump.  Phone going directly to voice mail.  Another message left to call me today to let me know how she is doing.

## 2022-04-06 ENCOUNTER — Other Ambulatory Visit: Payer: Self-pay

## 2022-04-06 DIAGNOSIS — E1142 Type 2 diabetes mellitus with diabetic polyneuropathy: Secondary | ICD-10-CM

## 2022-04-16 ENCOUNTER — Emergency Department: Payer: Medicaid Other

## 2022-04-16 ENCOUNTER — Emergency Department
Admission: EM | Admit: 2022-04-16 | Discharge: 2022-04-16 | Disposition: A | Discharge status: Home / Self Care | Payer: Medicaid Other | Attending: Emergency Medicine | Admitting: Emergency Medicine

## 2022-04-16 ENCOUNTER — Other Ambulatory Visit: Payer: Self-pay

## 2022-04-16 DIAGNOSIS — J189 Pneumonia, unspecified organism: Secondary | ICD-10-CM | POA: Diagnosis not present

## 2022-04-16 DIAGNOSIS — F419 Anxiety disorder, unspecified: Secondary | ICD-10-CM | POA: Diagnosis not present

## 2022-04-16 DIAGNOSIS — E860 Dehydration: Secondary | ICD-10-CM | POA: Diagnosis not present

## 2022-04-16 DIAGNOSIS — E1065 Type 1 diabetes mellitus with hyperglycemia: Secondary | ICD-10-CM

## 2022-04-16 DIAGNOSIS — I10 Essential (primary) hypertension: Secondary | ICD-10-CM | POA: Diagnosis not present

## 2022-04-16 DIAGNOSIS — E1165 Type 2 diabetes mellitus with hyperglycemia: Secondary | ICD-10-CM | POA: Insufficient documentation

## 2022-04-16 DIAGNOSIS — Z20822 Contact with and (suspected) exposure to covid-19: Secondary | ICD-10-CM | POA: Diagnosis not present

## 2022-04-16 DIAGNOSIS — R0781 Pleurodynia: Secondary | ICD-10-CM | POA: Insufficient documentation

## 2022-04-16 DIAGNOSIS — R739 Hyperglycemia, unspecified: Secondary | ICD-10-CM

## 2022-04-16 LAB — HEPATIC FUNCTION PANEL
ALT: 11 U/L (ref 0–44)
AST: 16 U/L (ref 15–41)
Albumin: 4.2 g/dL (ref 3.5–5.0)
Alkaline Phosphatase: 67 U/L (ref 38–126)
Bilirubin, Direct: 0.1 mg/dL (ref 0.0–0.2)
Indirect Bilirubin: 0.4 mg/dL (ref 0.3–0.9)
Total Bilirubin: 0.5 mg/dL (ref 0.3–1.2)
Total Protein: 7 g/dL (ref 6.5–8.1)

## 2022-04-16 LAB — CBC
HCT: 37.7 % (ref 36.0–46.0)
Hemoglobin: 13.4 g/dL (ref 12.0–15.0)
MCH: 30.1 pg (ref 26.0–34.0)
MCHC: 35.5 g/dL (ref 30.0–36.0)
MCV: 84.7 fL (ref 80.0–100.0)
Platelets: 208 10*3/uL (ref 150–400)
RBC: 4.45 MIL/uL (ref 3.87–5.11)
RDW: 12.6 % (ref 11.5–15.5)
WBC: 9.1 10*3/uL (ref 4.0–10.5)
nRBC: 0 % (ref 0.0–0.2)

## 2022-04-16 LAB — URINALYSIS, ROUTINE W REFLEX MICROSCOPIC
Bilirubin Urine: NEGATIVE
Glucose, UA: >=500 mg/dL — AB
Ketones, ur: NEGATIVE mg/dL
Leukocytes,Ua: NEGATIVE
Nitrite: NEGATIVE
Protein, ur: NEGATIVE mg/dL
Specific Gravity, Urine: 1.006 (ref 1.005–1.030)
WBC, UA: NONE SEEN WBC/hpf (ref 0–5)
pH: 6 (ref 5.0–8.0)

## 2022-04-16 LAB — BASIC METABOLIC PANEL
Anion gap: 11 (ref 5–15)
BUN: 5 mg/dL — ABNORMAL LOW (ref 6–20)
CO2: 24 mmol/L (ref 22–32)
Calcium: 8.8 mg/dL — ABNORMAL LOW (ref 8.9–10.3)
Chloride: 86 mmol/L — ABNORMAL LOW (ref 98–111)
Creatinine, Ser: 0.62 mg/dL (ref 0.44–1.00)
GFR, Estimated: >60 mL/min (ref >60)
Glucose, Bld: 265 mg/dL — ABNORMAL HIGH (ref 70–99)
Potassium: 3.1 mmol/L — ABNORMAL LOW (ref 3.5–5.1)
Sodium: 121 mmol/L — ABNORMAL LOW (ref 135–145)

## 2022-04-16 LAB — BETA-HYDROXYBUTYRIC ACID: Beta-Hydroxybutyric Acid: 0.74 mmol/L — ABNORMAL HIGH (ref 0.05–0.27)

## 2022-04-16 LAB — CBG MONITORING, ED
Glucose-Capillary: 226 mg/dL — ABNORMAL HIGH (ref 70–99)
Glucose-Capillary: 249 mg/dL — ABNORMAL HIGH (ref 70–99)

## 2022-04-16 LAB — RESP PANEL BY RT-PCR (FLU A&B, COVID) ARPGX2
Influenza A by PCR: NEGATIVE
Influenza B by PCR: NEGATIVE
SARS Coronavirus 2 by RT PCR: NEGATIVE

## 2022-04-16 LAB — LIPASE, BLOOD: Lipase: 23 U/L (ref 11–51)

## 2022-04-16 LAB — TROPONIN I (HIGH SENSITIVITY): Troponin I (High Sensitivity): 4 ng/L (ref <18)

## 2022-04-16 LAB — MAGNESIUM: Magnesium: 1.5 mg/dL — ABNORMAL LOW (ref 1.7–2.4)

## 2022-04-16 MED ORDER — SODIUM CHLORIDE 0.9 % IV BOLUS
1000.0000 mL | Freq: Once | INTRAVENOUS | Status: AC
  Administered 2022-04-16: 1000 mL via INTRAVENOUS

## 2022-04-16 MED ORDER — AZITHROMYCIN 250 MG PO TABS: ORAL_TABLET | ORAL | 0 refills | Status: DC

## 2022-04-16 MED ORDER — DIAZEPAM 5 MG PO TABS
ORAL_TABLET | ORAL | Status: AC
  Filled 2022-04-16: qty 1

## 2022-04-16 MED ORDER — ONDANSETRON 4 MG PO TBDP: 4.0000 mg | ORAL_TABLET | Freq: Three times a day (TID) | ORAL | 0 refills | Status: DC | PRN

## 2022-04-16 MED ORDER — IOHEXOL 350 MG/ML SOLN
75.0000 mL | Freq: Once | INTRAVENOUS | Status: AC | PRN
  Administered 2022-04-16: 75 mL via INTRAVENOUS

## 2022-04-16 MED ORDER — DIAZEPAM 5 MG PO TABS
5.0000 mg | ORAL_TABLET | Freq: Once | ORAL | Status: AC
  Administered 2022-04-16: 5 mg via ORAL
  Filled 2022-04-16: qty 1

## 2022-04-16 NOTE — ED Notes (Signed)
Pt EKG and Vitals done

## 2022-04-16 NOTE — ED Provider Notes (Signed)
[facility] Provider Note    Event Date/Time   First MD Initiated Contact with Patient 04/16/22 1700     (approximate)   History   Chief Complaint: Hyperglycemia and Anxiety   HPI  TEIGEN PARSLOW is a 46 y.o. female with a history of type 1 diabetes, GERD, chronic pain, hypertension, anxiety who comes ED complaining of malaise and poor appetite for the last 6 days ever since being taken off of Xanax.  She reports that due to a change in her primary care doctor, she is no longer being prescribed Xanax and instead was prescribed hydroxyzine.  She also notes that her husband recently died a few weeks ago from cancer and she feels like she Polite was not taking adequate care of herself during his end-of-life.  She also complains of some shortness of breath and mild pleuritic chest pain.  No exertional symptoms.     Physical Exam   Triage Vital Signs: ED Triage Vitals  Enc Vitals Group     BP 04/16/22 1549 (!) 194/88     Pulse Rate 04/16/22 1549 (!) 120     Resp 04/16/22 1549 16     Temp 04/16/22 1549 98.5 F (36.9 C)     Temp Source 04/16/22 1549 Oral     SpO2 04/16/22 1549 97 %     Weight 04/16/22 1610 113 lb (51.3 kg)     Height 04/16/22 1610 '5\' 5"'$  (1.651 m)     Head Circumference --      Peak Flow --      Pain Score 04/16/22 1609 3     Pain Loc --      Pain Edu? --      Excl. in [place]? --     Most recent vital signs: Vitals:   04/16/22 1758 04/16/22 2100  BP:  128/77  Pulse:  96  Resp:  18  Temp:  97.6 F (36.4 C)  SpO2: 97% 98%    General: Awake, no distress.  CV:  Good peripheral perfusion.  Tachycardia heart rate 120.  Symmetric distal pulses. Resp:  Normal effort.  Clear to auscultation bilaterally Abd:  No distention.  Soft nontender Other:  No lower extremity edema or calf tenderness.  Dry mucous membranes.   ED Results / Procedures / Treatments   Labs (all labs ordered are listed, but only abnormal results are  displayed) Labs Reviewed  BASIC METABOLIC PANEL - Abnormal; Notable for the following components:      Result Value   Sodium 121 (*)    Potassium 3.1 (*)    Chloride 86 (*)    Glucose, Bld 265 (*)    BUN 5 (*)    Calcium 8.8 (*)    All other components within normal limits  BETA-HYDROXYBUTYRIC ACID - Abnormal; Notable for the following components:   Beta-Hydroxybutyric Acid 0.74 (*)    All other components within normal limits  MAGNESIUM - Abnormal; Notable for the following components:   Magnesium 1.5 (*)    All other components within normal limits  URINALYSIS, ROUTINE W REFLEX MICROSCOPIC - Abnormal; Notable for the following components:   Color, Urine COLORLESS (*)    APPearance CLEAR (*)    Glucose, UA >=500 (*)    Hgb urine dipstick SMALL (*)    Bacteria, UA RARE (*)    All other components within normal limits  CBG MONITORING, ED - Abnormal; Notable for the following components:   Glucose-Capillary 226 (*)  All other components within normal limits  CBG MONITORING, ED - Abnormal; Notable for the following components:   Glucose-Capillary 249 (*)    All other components within normal limits  RESP PANEL BY RT-PCR (FLU A&B, COVID) ARPGX2  CBC  HEPATIC FUNCTION PANEL  LIPASE, BLOOD  TROPONIN I (HIGH SENSITIVITY)     EKG Interpreted by me Sinus tachycardia rate of 120.  Normal axis, normal intervals.  Normal QRS ST segments and T waves.   RADIOLOGY Chest x-ray interpreted by me, negative for pneumonia pleural effusion pulmonary edema or pneumothorax.  Radiology report reviewed.  CT angiogram shows basilar infiltrate consistent with pneumonia.  Negative for PE   PROCEDURES:  Procedures   MEDICATIONS ORDERED IN ED: Medications  diazepam (VALIUM) 5 MG tablet (has no administration in time range)  sodium chloride 0.9 % bolus 1,000 mL (0 mLs Intravenous Stopped 04/16/22 1924)  sodium chloride 0.9 % bolus 1,000 mL (1,000 mLs Intravenous New Bag/Given 04/16/22  2006)  diazepam (VALIUM) tablet 5 mg (5 mg Oral Given 04/16/22 1833)  iohexol (OMNIPAQUE) 350 MG/ML injection 75 mL (75 mLs Intravenous Contrast Given 04/16/22 1930)     IMPRESSION / MDM / ASSESSMENT AND PLAN / ED COURSE  I reviewed the triage vital signs and the nursing notes.                              Differential diagnosis includes, but is not limited to, dehydration, AKI, electrolyte abnormality, DKA, pneumonia, pleural effusion, viral illness, acute on chronic anxiety  Patient's presentation is most consistent with acute presentation with potential threat to life or bodily function.  Patient presents with malaise and poor oral intake over the last few days ever since being discontinued from Xanax.  She is not having any significant benzodiazepine withdrawal symptoms and since its been a week, I doubt she is at risk for any serious adverse effects.  Primarily her symptoms appear to be due to grief and discontinuation of Xanax.  Labs do show mild hyperglycemia along with hyponatremia.  No metabolic acidosis, no significant ketosis, not consistent with DKA.  No neurologic symptoms attributable to the hyponatremia.  She was given 2 L of normal saline, with normalization of her vital signs.  She was given oral Valium and feels much better.  COVID test is negative.  CT chest is negative for PE but does show signs of pneumonia which does explain her symptoms.  We will start her on azithromycin, recommend continue primary care follow-up.       FINAL CLINICAL IMPRESSION(S) / ED DIAGNOSES   Final diagnoses:  Anxiety  Hyperglycemia  Dehydration  Community acquired pneumonia, unspecified laterality     Rx / DC Orders   ED Discharge Orders          Ordered    azithromycin (ZITHROMAX Z-PAK) 250 MG tablet        04/16/22 2137    ondansetron (ZOFRAN-ODT) 4 MG disintegrating tablet  Every 8 hours PRN        04/16/22 2138             Note:  This document was prepared using  Dragon voice recognition software and may include unintentional dictation errors.   Carrie Mew, MD 04/16/22 2146

## 2022-04-16 NOTE — Discharge Instructions (Signed)
Your CT scan shows some mild pneumonia in the lower lungs.  Your lab tests were all reassuring, and we gave you IV fluids for dehydration.  Continue to monitor your blood sugar and take all of your medicines as prescribed.  Be sure to keep your appointments with your endocrinology lab tomorrow and endocrinology doctor on Thursday.

## 2022-04-16 NOTE — ED Triage Notes (Signed)
Pt to ED with mother, pt is type 1 diabetic and states that blood sugars have been high last few days, as high as 480, with ketones in urine, and has been taking insulin as normal. Pt states she feels confused (forgetful).   States has been off Xanax since 25th September, old PCP retired and new PCP would not renew prescription. Pt was on Xanax for years and also takes prescription hydrocodone. Also pt states husband died on 06-May-2022 from terminal cancer at age 73. Pt now living with parents.  Denies abdominal pain. States has 3/10 chest pain and SOB. EKG already shown to provider.

## 2022-04-17 ENCOUNTER — Other Ambulatory Visit (INDEPENDENT_AMBULATORY_CARE_PROVIDER_SITE_OTHER): Payer: Medicaid Other

## 2022-04-17 DIAGNOSIS — E1065 Type 1 diabetes mellitus with hyperglycemia: Secondary | ICD-10-CM

## 2022-04-17 LAB — BASIC METABOLIC PANEL
BUN: 5 mg/dL — ABNORMAL LOW (ref 6–23)
CO2: 27 mEq/L (ref 19–32)
Calcium: 9.1 mg/dL (ref 8.4–10.5)
Chloride: 89 mEq/L — ABNORMAL LOW (ref 96–112)
Creatinine, Ser: 0.74 mg/dL (ref 0.40–1.20)
GFR: 97.31 mL/min (ref >60.00)
Glucose, Bld: 307 mg/dL — ABNORMAL HIGH (ref 70–99)
Potassium: 4.1 mEq/L (ref 3.5–5.1)
Sodium: 124 mEq/L — ABNORMAL LOW (ref 135–145)

## 2022-04-17 LAB — HEMOGLOBIN A1C: Hgb A1c MFr Bld: 9.4 % — ABNORMAL HIGH (ref 4.6–6.5)

## 2022-04-19 ENCOUNTER — Telehealth: Payer: Self-pay

## 2022-04-19 NOTE — Telephone Encounter (Signed)
Received from after hours nurse call was on 04/16/22;  Caller says that her sgar ketones are up. She can't keep her sugar down; it is 230. It was higher, but she has not eate. She is having some confusion. She has a lot happening, her husband just passed away. She was advised to go to ED and she did.

## 2022-04-20 ENCOUNTER — Encounter: Payer: Self-pay | Admitting: Endocrinology

## 2022-04-20 ENCOUNTER — Telehealth: Payer: Self-pay | Admitting: Endocrinology

## 2022-04-20 ENCOUNTER — Ambulatory Visit (INDEPENDENT_AMBULATORY_CARE_PROVIDER_SITE_OTHER): Payer: Medicaid Other | Admitting: Endocrinology

## 2022-04-20 VITALS — BP 160/90 | HR 98 | Ht 65.0 in | Wt 120.6 lb

## 2022-04-20 DIAGNOSIS — E1065 Type 1 diabetes mellitus with hyperglycemia: Secondary | ICD-10-CM

## 2022-04-20 DIAGNOSIS — E871 Hypo-osmolality and hyponatremia: Secondary | ICD-10-CM | POA: Diagnosis not present

## 2022-04-20 DIAGNOSIS — F4321 Adjustment disorder with depressed mood: Secondary | ICD-10-CM | POA: Diagnosis not present

## 2022-04-20 MED ORDER — ALPRAZOLAM 0.5 MG PO TABS: 0.5000 mg | ORAL_TABLET | Freq: Two times a day (BID) | ORAL | 0 refills | Status: DC | PRN

## 2022-04-20 NOTE — Telephone Encounter (Signed)
Patient called to make sure [facility].

## 2022-04-20 NOTE — Patient Instructions (Addendum)
Cut liquids 1/2   Bolus before each meal  Sugar free lemon drops

## 2022-04-20 NOTE — Progress Notes (Addendum)
Patient ID: [name], female   DOB: 10/22/1975, 46 y.o.   MRN: [id number]    Chief complaint : Follow up of Type 1 Diabetes  History of Present Illness:          Date of diagnosis:  1989    Prior history:   She has been on insulin pump for several years including T-slim pump since 2017 A1c range in the last few years has been 7.7-9.3   INSULIN pump settings: Basal rate 0.2 all 24 Boluses with carbohydrate set up 1:1 ratio, correction 1: 600, target 130 Duration of insulin action 5 hours, maximum bolus 10 units  Current insulin: NovoLog  Recent history:   She thinks for the last several weeks her blood sugars have been running much higher than usual and possibly from stress Her A1c is 9.4, previously 8.6 She has been on the same insulin pump since 2017 However review of her pump download indicates that she is only taking 1 to 2 units of boluses to cover meals and not actually using carbohydrate counting and also unclear whether she is taking any correction boluses Currently her basal insulin only is 8 units/day Manual boluses are about 9 units/day total and control IQ auto bolus about 2 units/day  She is able to use her Dexcom CGM fairly consistently Occasionally she has exercise mode programmed but likely an error  She is mostly drinking dry drinks and no regular soft drink Her blood sugars appear to be consistently averaging over 200 at all times with some variability especially overnight and after meals Likely is bolusing after starting to eat even though she thinks she is bolusing before judging from her blood sugar patterns  No hypoglycemia    Glucose patterns from sensor download on the pump: OVERNIGHT blood sugars are significantly variable but in general averaging just over 200 at all times with fasting glucose about 190 and no hypoglycemia Postprandial glucoses appear to be mostly running higher especially after lunch and periodically after dinner but difficult  to assess mealtimes because of lack of carbohydrate entry food boluses Hyperglycemia Hypoglycemia:  CGM data:   CGM use % of time   2-week average/SD 228  Time in range    10    %  % Time Above 180 90  % Time above 250   % Time Below 80 0      PRE-MEAL  overnight  mornings  afternoon  evening Overall  Glucose range:     116-400  Averages: 214 218 254 228     Hypoglycemia: Not for some time       Retinal exams, Most recent:    Diabetes labs:  Lab Results  Component Value Date   HGBA1C 9.4 (H) 04/17/2022   HGBA1C 8.6 (A) 11/09/2021   HGBA1C 8.8 (A) 09/07/2021   Lab Results  Component Value Date   MICROALBUR <0.7 12/31/2015   CREATININE 0.74 04/17/2022    Lab on 04/17/2022  Component Date Value Ref Range Status   Sodium 04/17/2022 124 (L)  135 - 145 mEq/L Final   Potassium 04/17/2022 4.1  3.5 - 5.1 mEq/L Final   Chloride 04/17/2022 89 (L)  96 - 112 mEq/L Final   CO2 04/17/2022 27  19 - 32 mEq/L Final   Glucose, Bld 04/17/2022 307 (H)  70 - 99 mg/dL Final   BUN 40/98/1191 5 (L)  6 - 23 mg/dL Final   Creatinine, Ser 04/17/2022 0.74  0.40 - 1.20 mg/dL Final   GFR  04/17/2022 97.31  >60.00 mL/min Final   Calculated using the CKD-EPI Creatinine Equation (2021)   Calcium 04/17/2022 9.1  8.4 - 10.5 mg/dL Final   Hgb Z6X MFr Bld 04/17/2022 9.4 (H)  4.6 - 6.5 % Final   Glycemic Control Guidelines for People with Diabetes:Non Diabetic:  <6%Goal of Therapy: <7%Additional Action Suggested:  >8%   Admission on 04/16/2022, Discharged on 04/16/2022  Component Date Value Ref Range Status   Glucose-Capillary 04/16/2022 226 (H)  70 - 99 mg/dL Final   Glucose reference range applies only to samples taken after fasting for at least 8 hours.   Comment 1 04/16/2022 Notify RN   Final   Comment 2 04/16/2022 Document in Chart   Final   Sodium 04/16/2022 121 (L)  135 - 145 mmol/L Final   Potassium 04/16/2022 3.1 (L)  3.5 - 5.1 mmol/L Final   Chloride 04/16/2022 86 (L)  98 - 111 mmol/L  Final   CO2 04/16/2022 24  22 - 32 mmol/L Final   Glucose, Bld 04/16/2022 265 (H)  70 - 99 mg/dL Final   Glucose reference range applies only to samples taken after fasting for at least 8 hours.   BUN 04/16/2022 5 (L)  6 - 20 mg/dL Final   Creatinine, Ser 04/16/2022 0.62  0.44 - 1.00 mg/dL Final   Calcium 09/60/4540 8.8 (L)  8.9 - 10.3 mg/dL Final   GFR, Estimated 04/16/2022 >60  >60 mL/min Final   Comment: (NOTE) Calculated using the CKD-EPI Creatinine Equation (2021)    Anion gap 04/16/2022 11  5 - 15 Final   Performed at [facility], [address] [place], [place] [address]   WBC 04/16/2022 9.1  4.0 - 10.5 K/uL Final   RBC 04/16/2022 4.45  3.87 - 5.11 MIL/uL Final   Hemoglobin 04/16/2022 13.4  12.0 - 15.0 g/dL Final   HCT 14/78/2956 37.7  36.0 - 46.0 % Final   MCV 04/16/2022 84.7  80.0 - 100.0 fL Final   MCH 04/16/2022 30.1  26.0 - 34.0 pg Final   MCHC 04/16/2022 35.5  30.0 - 36.0 g/dL Final   RDW 21/30/8657 12.6  11.5 - 15.5 % Final   Platelets 04/16/2022 208  150 - 400 K/uL Final   nRBC 04/16/2022 0.0  0.0 - 0.2 % Final   Performed at [facility], [address] [place], [place] [address]   Troponin I (High Sensitivity) 04/16/2022 4  <18 ng/L Final   Comment: (NOTE) Elevated high sensitivity troponin I (hsTnI) values and significant  changes across serial measurements may suggest ACS but many other  chronic and acute conditions are known to elevate hsTnI results.  Refer to the "Links" section for chest pain algorithms and additional  guidance. Performed at [facility], [address] [place], [place] [address]    Beta-Hydroxybutyric Acid 04/16/2022 0.74 (H)  0.05 - 0.27 mmol/L Final   Performed at [facility], [address] [place], [place] [address]   Total Protein 04/16/2022 7.0  6.5 - 8.1 g/dL Final   Albumin 40/04/2724 4.2  3.5 - 5.0 g/dL Final   AST 36/64/4034 16  15 - 41 U/L Final   ALT 04/16/2022 11  0 - 44 U/L Final    Alkaline Phosphatase 04/16/2022 67  38 - 126 U/L Final   Total Bilirubin 04/16/2022 0.5  0.3 - 1.2 mg/dL Final   Bilirubin, Direct 04/16/2022 0.1  0.0 - 0.2 mg/dL Final   Indirect Bilirubin 04/16/2022 0.4  0.3 - 0.9 mg/dL Final   Performed at [facility], [address] [place], [place] [address]   Lipase 04/16/2022 23  11 - 51 U/L Final   Performed at [facility], [address] [place], [place] [address]   Magnesium 04/16/2022 1.5 (L)  1.7 - 2.4 mg/dL Final   Performed at [facility], [address] [place], [place] [address]   Glucose-Capillary 04/16/2022 249 (H)  70 - 99 mg/dL Final   Glucose reference range applies only to samples taken after fasting for at least 8 hours.   Color, Urine 04/16/2022 COLORLESS (A)  YELLOW Final   APPearance 04/16/2022 CLEAR (A)  CLEAR Final   Specific Gravity, Urine 04/16/2022 1.006  1.005 - 1.030 Final   pH 04/16/2022 6.0  5.0 - 8.0 Final   Glucose, UA 04/16/2022 >=500 (A)  NEGATIVE mg/dL Final   Hgb urine dipstick 04/16/2022 SMALL (A)  NEGATIVE Final   Bilirubin Urine 04/16/2022 NEGATIVE  NEGATIVE Final   Ketones, ur 04/16/2022 NEGATIVE  NEGATIVE mg/dL Final   Protein, ur 91/47/8295 NEGATIVE  NEGATIVE mg/dL Final   Nitrite 62/13/0865 NEGATIVE  NEGATIVE Final   Leukocytes,Ua 04/16/2022 NEGATIVE  NEGATIVE Final   WBC, UA 04/16/2022 NONE SEEN  0 - 5 WBC/hpf Final   Bacteria, UA 04/16/2022 RARE (A)  NONE SEEN Final   Squamous Epithelial / LPF 04/16/2022 0-5  0 - 5 Final   Mucus 04/16/2022 PRESENT   Final   Performed at [facility], [address] [place], [place] [address]   SARS Coronavirus 2 by RT PCR 04/16/2022 NEGATIVE  NEGATIVE Final   Comment: (NOTE) SARS-CoV-2 target nucleic acids are NOT DETECTED.  The SARS-CoV-2 RNA is generally detectable in upper respiratory specimens during the acute phase of infection. The lowest concentration of SARS-CoV-2 viral copies this assay can detect is 138  copies/mL. A negative result does not preclude SARS-Cov-2 infection and should not be used as the sole basis for treatment or other patient management decisions. A negative result may occur with  improper specimen collection/handling, submission of specimen other than nasopharyngeal swab, presence of viral mutation(s) within the areas targeted by this assay, and inadequate number of viral copies(<138 copies/mL). A negative result must be combined with clinical observations, patient history, and epidemiological information. The expected result is Negative.  Fact Sheet for Patients:  BloggerCourse.com  Fact Sheet for Healthcare Providers:  SeriousBroker.it  This test is no                          t yet approved or cleared by the [place] FDA and  has been authorized for detection and/or diagnosis of SARS-CoV-2 by FDA under an Emergency Use Authorization (EUA). This EUA will remain  in effect (meaning this test can be used) for the duration of the COVID-19 declaration under Section 564(b)(1) of the Act, 21 U.S.C.section 360bbb-3(b)(1), unless the authorization is terminated  or revoked sooner.       Influenza A by PCR 04/16/2022 NEGATIVE  NEGATIVE Final   Influenza B by PCR 04/16/2022 NEGATIVE  NEGATIVE Final   Comment: (NOTE) The Xpert Xpress SARS-CoV-2/FLU/RSV plus assay is intended as an aid in the diagnosis of influenza from Nasopharyngeal swab specimens and should not be used as a sole basis for treatment. Nasal washings and aspirates are unacceptable for Xpert Xpress SARS-CoV-2/FLU/RSV testing.  Fact Sheet for Patients: BloggerCourse.com  Fact Sheet for Healthcare Providers: SeriousBroker.it  This test  is not yet approved or cleared by the [place] and has been authorized for detection and/or diagnosis of SARS-CoV-2 by FDA under an Emergency Use Authorization  (EUA). This EUA will remain in effect (meaning this test can be used) for the duration of the COVID-19 declaration under Section 564(b)(1) of the Act, 21 U.S.C. section 360bbb-3(b)(1), unless the authorization is terminated or revoked.  Performed at [facility], [address] [place], [place] [address]     Allergies as of 04/20/2022   No Known Allergies      Medication List        Accurate as of April 20, 2022  1:54 PM. If you have any questions, ask your nurse or doctor.          Accu-Chek Guide Me w/Device Kit Use to check blood sugar daily   ADVIL PO Take 2-3 tablets by mouth every 6 (six) hours as needed.   ALPRAZolam 0.5 MG tablet Commonly known as: XANAX Take 1 tablet (0.5 mg total) by mouth 2 (two) times daily as needed for anxiety.   amLODipine 5 MG tablet Commonly known as: NORVASC Take 1 tablet (5 mg total) by mouth daily. Please make follow up appointment for future refills.TAKE 1 TABLET (5 MG TOTAL) BY MOUTH DAILY.   atorvastatin 10 MG tablet Commonly known as: LIPITOR Take 1 tablet (10 mg total) by mouth at bedtime.   Atrovent HFA 17 MCG/ACT inhaler Generic drug: ipratropium Inhale 2 puffs into the lungs every 4 (four) hours as needed for wheezing.   azithromycin 250 MG tablet Commonly known as: Zithromax Z-Pak Take 2 tablets (500 mg) on  Day 1,  followed by 1 tablet (250 mg) once daily on Days 2 through 5.   Dexcom G6 Sensor Misc 1 each by Does not apply route See admin instructions. Change sensor every 10 days; E10.9   Dexcom G6 Transmitter Misc Use as instructed to check blood sugars, change every 3 months.   gabapentin 300 MG capsule Commonly known as: Neurontin Take 1 capsule (300 mg total) by mouth 3 (three) times daily. As needed   glucagon 1 MG injection Inject 1 mg into the muscle once as needed.   HYDROcodone-acetaminophen 5-325 MG tablet Commonly known as: NORCO/VICODIN Take 1 tablet by mouth 3 (three) times daily  as needed.   hydrOXYzine 25 MG tablet Commonly known as: ATARAX Take 25 mg by mouth daily as needed.   insulin aspart 100 UNIT/ML FlexPen Commonly known as: NOVOLOG Inject 2 Units into the skin 3 (three) times daily with meals.   insulin aspart 100 UNIT/ML injection Commonly known as: novoLOG Inject 0-9 Units into the skin 3 (three) times daily with meals. CBG 70 - 120: 0 units CBG 121 -150: 0 units CBG 151-200: 0 units CBG 201-250: 2 units CBG 251-300: 3 units CBG 301-350: 4 units CBG 351-400: 5 units CBG greater than 400: Call MD for additional instructions or go to the emergency room   insulin glargine 100 UNIT/ML Solostar Pen Commonly known as: LANTUS Inject 10 Units into the skin daily.   multivitamin with minerals Tabs tablet Take 1 tablet by mouth daily.   omeprazole 40 MG capsule Commonly known as: PRILOSEC TAKE 1 CAPSULE BY MOUTH 2 TIMES DAILY.   ondansetron 4 MG disintegrating tablet Commonly known as: ZOFRAN-ODT Take 1 tablet (4 mg total) by mouth every 8 (eight) hours as needed for nausea or vomiting.   Pen Needles 32G X 5 MM Misc Use as instructed  rOPINIRole 1 MG tablet Commonly known as: REQUIP TAKE 1 TABLET BY MOUTH AT BEDTIME (PLZ SCHED APPT WITH NEW PROVIDER)  Please make appointment with Dr. Clarene Duke.                 Needs to make appointment with Dr. Clarene Duke. What changed:  how much to take how to take this when to take this   sertraline 25 MG tablet Commonly known as: ZOLOFT Take 50 mg by mouth daily.   Spiriva Respimat 2.5 MCG/ACT Aers Generic drug: Tiotropium Bromide Monohydrate Inhale 2 puffs into the lungs daily.   Ventolin HFA 108 (90 Base) MCG/ACT inhaler Generic drug: albuterol SMARTSIG:2 Puff(s) By Mouth Every 4 Hours PRN   Vitamin D3 50 MCG (2000 UT) capsule Take 2,000 Units by mouth daily.        Allergies: No Known Allergies  Past Medical History:  Diagnosis Date   Anxiety state, unspecified  03/26/2009   ASTHMA 02/02/2007   Asthma    DEPRESSION 02/16/2009   DIABETES MELLITUS, TYPE I 02/02/2007   Endometriosis    GERD 12/21/2008   Herpes    at 18   Hyperlipidemia    Lesion of vocal cord    Osteoarthritis    Severe nonproliferative diabetic retinopathy of right eye, without macular edema, associated with type 1 diabetes mellitus (HCC) 11/04/2019   Shortness of breath 09/25/2007   Vocal fold leukoplakia     Past Surgical History:  Procedure Laterality Date   ABDOMINAL HYSTERECTOMY  2009   BSO   DIRECT LARYNGOSCOPY N/A 08/17/2017   Procedure: DIRECT LARYNGOSCOPY WITH OPERATING TELESCOPE WITH BIOPSY;  Surgeon: Graylin Shiver, MD;  Location: [facility] OR;  Service: ENT;  Laterality: N/A;   NASOPHARYNGEAL BIOPSY Right 08/17/2017   Procedure: NASOPHARYNGEAL BIOPSY RIGHT NASAL LESION;  Surgeon: Graylin Shiver, MD;  Location: [facility] OR;  Service: ENT;  Laterality: Right;    Family History  Problem Relation Age of Onset   Diabetes Mother    Diabetes Maternal Aunt    Emphysema Maternal Uncle    Diabetes Maternal Grandfather    Lung cancer Maternal Aunt    Colon cancer Neg Hx    Kidney disease Neg Hx    Liver disease Neg Hx    Esophageal cancer Neg Hx    Rectal cancer Neg Hx    Stomach cancer Neg Hx     Social History:  reports that she has been smoking cigarettes. She has a 37.50 pack-year smoking history. She has never used smokeless tobacco. She reports that she does not currently use alcohol. She reports that she does not use drugs.    Review of Systems:   Blood pressure: Tends to be relatively high, not followed by PCP recently Not on antihypertensives  BP Readings from Last 3 Encounters:  04/20/22 (!) 160/90  04/16/22 (!) 141/85  03/15/22 (!) 154/82     Lipids: Not available  Diabetes complications: None reported, urine microalbumin not available  Last eye exam 1/23 Last foot exam 6/22  She says that she has had significant anxiety and depression, currently  treated with sertraline by an online facility She says that she is out of her Xanax which was prescribed by PCP and she cannot get anybody else to prescribe it  HYPONATREMIA: This has been present for several years She also has been on sertraline Not on any HCTZ She says that she has dry mouth and is drinking about 10 cans of diet drinks daily Previously no evaluation done  with urine osmolality.  Her urinalysis results show specific gravity near 1.0  Physical Examination:  BP (!) 160/90   Pulse 98   Wt 120 lb 9.6 oz (54.7 kg)   SpO2 99%   BMI 20.07 kg/m         ASSESSMENT/PLAN   Diabetes type 1 with consistently poor control on insulin pump  A1c is now 9.4  Problems identified: Does not have appear to be getting adequate basal insulin as her blood sugars are consistently high at all times Blood sugars seem to be higher midday and afternoon likely from missed or inadequate mealtime boluses Although she thinks she is bolusing consistently for meals without counting carbs she is only appearing to be bolusing 1 to 2 units on only a few days Her correction factor is erroneous as she is likely getting only very small fractions of 1 unit for correction even with the auto correction mode She may need to check with tandem to see if she is having any issues with her pump function while bolusing   Pump settings will be changed as follows  12 AM-10 AM = 0.35, 10 AM-6 PM = 0.4 5 and 6 PM = 0.35 Correction factor I: 80 instead of 1: 600 She will bolus using a carb ratio 1:10 instead of 15 or 1.5 units per starch exchange Make sure she is putting in her boluses for each meal Also do correction boluses manually anytime blood sugars are consistently high Avoid using exercise mode May be referred from consultation with diabetes educator  She may need to discuss her boluses further with diabetes educator and if boluses are not being registered on her download on next visit may consider  discussing with the company for troubleshooting  For her hyponatremia she needs to discuss with her psychiatrist switching her sertraline to Effexor or similar drug Also will assess her urinary osmolarity today Discussed that she likely needs to cut back on her fluids at least 50% To relieve her dry mouth she will use sugar-free lemon drops instead of drinking diet drinks If sodium consistently stays low and has confirmed SIADH some may consider urea  We will give her a temporary supply of 30 tablets Xanax for anxiety but needs to get further prescriptions from her PCP  Patient Instructions  Cut liquids 1/2   Bolus before each meal  Sugar free lemon drops                             Total visit time for evaluation and management of multiple problems, review of extensive relevant records and labs, counseling = 45 minutes  Rick Warnick 04/20/2022, 1:54 PM

## 2022-04-21 ENCOUNTER — Encounter: Payer: Self-pay | Admitting: Endocrinology

## 2022-04-21 NOTE — Telephone Encounter (Signed)
RX sent yesterday by provider

## 2022-04-23 LAB — OSMOLALITY, URINE: Osmolality, Ur: 67 mOsmol/kg

## 2022-05-02 ENCOUNTER — Other Ambulatory Visit: Payer: Self-pay | Admitting: Nurse Practitioner

## 2022-05-02 ENCOUNTER — Other Ambulatory Visit: Payer: Self-pay | Admitting: Endocrinology

## 2022-05-02 NOTE — Telephone Encounter (Signed)
Patient called in requesting more xanax. States she cant find a PCP that will see her before April. Wants to know if you can send until then

## 2022-05-04 ENCOUNTER — Other Ambulatory Visit: Payer: Self-pay | Admitting: Endocrinology

## 2022-05-04 MED ORDER — ALPRAZOLAM 0.5 MG PO TABS: 0.5000 mg | ORAL_TABLET | Freq: Two times a day (BID) | ORAL | 0 refills | Status: DC | PRN

## 2022-05-10 ENCOUNTER — Telehealth: Payer: Self-pay

## 2022-05-12 NOTE — Telephone Encounter (Signed)
DMV paperwork was faxed on 05/11/22.

## 2022-05-16 ENCOUNTER — Other Ambulatory Visit: Payer: Self-pay | Admitting: Endocrinology

## 2022-05-17 ENCOUNTER — Telehealth: Payer: Self-pay

## 2022-05-17 NOTE — Telephone Encounter (Signed)
Patient LVM requesting refill of ALPRAZolam (XANAX) 0.5 MG tablet.

## 2022-05-19 ENCOUNTER — Other Ambulatory Visit: Payer: Self-pay | Admitting: Endocrinology

## 2022-05-19 MED ORDER — ALPRAZOLAM 0.5 MG PO TABS: ORAL_TABLET | ORAL | 0 refills | Status: DC

## 2022-05-19 NOTE — Telephone Encounter (Signed)
Patient informed that 30 day supply has been sent and that she only needs to take 2/day max. Patient expressed her understanding that the next refill will need to come from her PCP

## 2022-05-31 ENCOUNTER — Ambulatory Visit (INDEPENDENT_AMBULATORY_CARE_PROVIDER_SITE_OTHER): Payer: Self-pay | Admitting: Physician Assistant

## 2022-05-31 ENCOUNTER — Encounter: Payer: Self-pay | Admitting: Physician Assistant

## 2022-05-31 VITALS — BP 156/93 | HR 118 | Temp 98.4°F | Resp 16 | Ht 65.0 in | Wt 119.9 lb

## 2022-05-31 DIAGNOSIS — G4709 Other insomnia: Secondary | ICD-10-CM

## 2022-05-31 DIAGNOSIS — Z7689 Persons encountering health services in other specified circumstances: Secondary | ICD-10-CM

## 2022-05-31 DIAGNOSIS — F419 Anxiety disorder, unspecified: Secondary | ICD-10-CM

## 2022-05-31 DIAGNOSIS — E7800 Pure hypercholesterolemia, unspecified: Secondary | ICD-10-CM

## 2022-05-31 DIAGNOSIS — I10 Essential (primary) hypertension: Secondary | ICD-10-CM

## 2022-05-31 DIAGNOSIS — E1049 Type 1 diabetes mellitus with other diabetic neurological complication: Secondary | ICD-10-CM

## 2022-05-31 MED ORDER — AMLODIPINE BESYLATE 10 MG PO TABS: 10.0000 mg | ORAL_TABLET | Freq: Every day | ORAL | 0 refills | Status: DC

## 2022-05-31 MED ORDER — ATORVASTATIN CALCIUM 10 MG PO TABS: 10.0000 mg | ORAL_TABLET | Freq: Every day | ORAL | 1 refills | Status: DC

## 2022-05-31 NOTE — Progress Notes (Signed)
I,Sulibeya S Dimas,acting as a Education administrator for [employer], PA-C.,have documented all relevant documentation on the behalf of Alejandra Speak, PA-C,as directed by  [employer], PA-C while in the presence of [employer], PA-C.   New patient visit   Patient: [name]   DOB: 1976/02/26   46 y.o. Female  MRN: [id number] Visit Date: 05/31/2022  Today's healthcare provider: [name], PA-C   Chief Complaint  Patient presents with   New Patient (Initial Visit)   Subjective    [name] is a 46 y.o. female who presents today as a new patient to establish care.  HPI  Patient reports that her PCP Dr. Rex Kras at [facility] retired. She reports she has been without a PCP for about 8 months.  Patient is fb endocrinology for DM  Patient requesting medication for anxiety. She reports taking xanax 0.5 mg BID. Patient reports medication helps with symptoms. She reports this was being filled by endo. Since she did not have a PCP. She was advised to establish with PCP for refills on xanax.      05/31/2022    2:28 PM 10/03/2018   10:21 AM 12/07/2015   10:47 AM  Depression screen PHQ 2/9  Decreased Interest 1 0 0  Down, Depressed, Hopeless 2 0 0  PHQ - 2 Score 3 0 0  Altered sleeping 1    Tired, decreased energy 1    Change in appetite 1    Feeling bad or failure about yourself  1    Trouble concentrating 1    Moving slowly or fidgety/restless 1    Suicidal thoughts 0    PHQ-9 Score 9    Difficult doing work/chores Not difficult at all        05/31/2022    2:27 PM  GAD 7 : Generalized Anxiety Score  Nervous, Anxious, on Edge 3  Control/stop worrying 3  Worry too much - different things 3  Trouble relaxing 2  Restless 2  Easily annoyed or irritable 2  Afraid - awful might happen 3  Total GAD 7 Score 18  Anxiety Difficulty Very difficult      Past Medical History:  Diagnosis Date   Anxiety state, unspecified 03/26/2009   ASTHMA 02/02/2007    Asthma    DEPRESSION 02/16/2009   DIABETES MELLITUS, TYPE I 02/02/2007   Endometriosis    GERD 12/21/2008   Herpes    at 18   Hyperlipidemia    Lesion of vocal cord    Osteoarthritis    Severe nonproliferative diabetic retinopathy of right eye, without macular edema, associated with type 1 diabetes mellitus ([place]) 11/04/2019   Shortness of breath 09/25/2007   Vocal fold leukoplakia    Past Surgical History:  Procedure Laterality Date   ABDOMINAL HYSTERECTOMY  2009   BSO   DIRECT LARYNGOSCOPY N/A 08/17/2017   Procedure: DIRECT LARYNGOSCOPY WITH OPERATING TELESCOPE WITH BIOPSY;  Surgeon: Helayne Seminole, MD;  Location: [place];  Service: ENT;  Laterality: N/A;   NASOPHARYNGEAL BIOPSY Right 08/17/2017   Procedure: NASOPHARYNGEAL BIOPSY RIGHT NASAL LESION;  Surgeon: Helayne Seminole, MD;  Location: [facility];  Service: ENT;  Laterality: Right;   Family Status  Relation Name Status   Mother  Alive   Father  Deceased   Brother Pringle Alive   Daughter  Alive   Son  Alive   Mat Aunt  (Not Specified)   Programmer, systems  (Not Specified)  Mat Uncle  (Not Specified)   MGF  (Not Specified)   Neg Hx  (Not Specified)   Family History  Problem Relation Age of Onset   Diabetes Mother    Diabetes Father    Diabetes type I Brother    Diabetes Maternal Aunt    Emphysema Maternal Uncle    Diabetes Maternal Grandfather    Colon cancer Neg Hx    Kidney disease Neg Hx    Liver disease Neg Hx    Esophageal cancer Neg Hx    Rectal cancer Neg Hx    Stomach cancer Neg Hx    Social History   Socioeconomic History   Marital status: Legally Separated    Spouse name: Not on file   Number of children: 2   Years of education: Not on file   Highest education level: Not on file  Occupational History    Employer: UNEMPLOYED  Tobacco Use   Smoking status: Every Day    Packs/day: 1.50    Years: 31.00    Total pack years: 46.50    Types: Cigarettes   Smokeless tobacco: Never    Tobacco comments:    11/03/2021 -  states she smokes a pack and a half a day  Vaping Use   Vaping Use: Former   Quit date: 02/23/2017   Devices: only used tobacco   Substance and Sexual Activity   Alcohol use: Not Currently    Alcohol/week: 0.0 standard drinks of alcohol   Drug use: No   Sexual activity: Yes    Partners: Male    Birth control/protection: None    Comment: 1st intercourse-14, partners- 46, current partner- 4 months  Other Topics Concern   Not on file  Social History Narrative   2 children--pt has custody. Husband pays child support.   Daily Caffeine Use-3 2 liters a day   Pt does not get regular exercise.   Social Determinants of Health   Financial Resource Strain: Not on file  Food Insecurity: Not on file  Transportation Needs: Not on file  Physical Activity: Not on file  Stress: Not on file  Social Connections: Not on file   Outpatient Medications Prior to Visit  Medication Sig   amLODipine (NORVASC) 5 MG tablet Take 1 tablet (5 mg total) by mouth daily. Please make follow up appointment for future refills.TAKE 1 TABLET (5 MG TOTAL) BY MOUTH DAILY.   Blood Glucose Monitoring Suppl (ACCU-CHEK GUIDE ME) w/Device KIT Use to check blood sugar daily   Cholecalciferol (VITAMIN D3) 50 MCG (2000 UT) capsule Take 2,000 Units by mouth daily.   Continuous Blood Gluc Sensor (DEXCOM G6 SENSOR) MISC 1 each by Does not apply route See admin instructions. Change sensor every 10 days; E10.9   Continuous Blood Gluc Transmit (DEXCOM G6 TRANSMITTER) MISC Use as instructed to check blood sugars, change every 3 months.   gabapentin (NEURONTIN) 300 MG capsule Take 1 capsule (300 mg total) by mouth 3 (three) times daily. As needed   glucagon 1 MG injection Inject 1 mg into the muscle once as needed.   Ibuprofen (ADVIL PO) Take 2-3 tablets by mouth every 6 (six) hours as needed.   insulin aspart (NOVOLOG) 100 UNIT/ML FlexPen Inject 2 Units into the skin 3 (three) times daily with meals.    insulin aspart (NOVOLOG) 100 UNIT/ML injection Inject 0-9 Units into the skin 3 (three) times daily with meals. CBG 70 - 120: 0 units CBG 121 -150: 0 units CBG 151-200: 0 units  CBG 201-250: 2 units CBG 251-300: 3 units CBG 301-350: 4 units CBG 351-400: 5 units CBG greater than 400: Call MD for additional instructions or go to the emergency room   Insulin Pen Needle (PEN NEEDLES) 32G X 5 MM MISC Use as instructed   ipratropium (ATROVENT HFA) 17 MCG/ACT inhaler Inhale 2 puffs into the lungs every 4 (four) hours as needed for wheezing.   Multiple Vitamin (MULTIVITAMIN WITH MINERALS) TABS tablet Take 1 tablet by mouth daily.   omeprazole (PRILOSEC) 40 MG capsule TAKE 1 CAPSULE BY MOUTH TWICE A DAY   ondansetron (ZOFRAN-ODT) 4 MG disintegrating tablet Take 1 tablet (4 mg total) by mouth every 8 (eight) hours as needed for nausea or vomiting.   rOPINIRole (REQUIP) 1 MG tablet TAKE 1 TABLET BY MOUTH AT BEDTIME (PLZ SCHED APPT WITH NEW PROVIDER)  Please make appointment with Dr. Rex Kras.                 Needs to make appointment with Dr. Rex Kras. (Patient taking differently: Take 1 mg by mouth at bedtime. TAKE 1 TABLET BY MOUTH AT BEDTIME (PLZ SCHED APPT WITH NEW PROVIDER)  Please make appointment with Dr. Rex Kras.                 Needs to make appointment with Dr. Rex Kras.)   sertraline (ZOLOFT) 25 MG tablet Take 50 mg by mouth daily.   [DISCONTINUED] atorvastatin (LIPITOR) 10 MG tablet Take 1 tablet (10 mg total) by mouth at bedtime.   ALPRAZolam (XANAX) 0.5 MG tablet 1 tablet up to twice a day for anxiety, do not exceed dose (Patient not taking: Reported on 05/31/2022)   HYDROcodone-acetaminophen (NORCO/VICODIN) 5-325 MG tablet Take 1 tablet by mouth 3 (three) times daily as needed. (Patient not taking: Reported on 05/31/2022)   hydrOXYzine (ATARAX) 25 MG tablet Take 25 mg by mouth daily as needed. (Patient not taking: Reported on 05/31/2022)   insulin glargine  (LANTUS) 100 UNIT/ML Solostar Pen Inject 10 Units into the skin daily.   Tiotropium Bromide Monohydrate (SPIRIVA RESPIMAT) 2.5 MCG/ACT AERS Inhale 2 puffs into the lungs daily. (Patient not taking: Reported on 05/31/2022)   VENTOLIN HFA 108 (90 Base) MCG/ACT inhaler SMARTSIG:2 Puff(s) By Mouth Every 4 Hours PRN (Patient not taking: Reported on 05/31/2022)   [DISCONTINUED] azithromycin (ZITHROMAX Z-PAK) 250 MG tablet Take 2 tablets (500 mg) on  Day 1,  followed by 1 tablet (250 mg) once daily on Days 2 through 5.   No facility-administered medications prior to visit.   No Known Allergies  Immunization History  Administered Date(s) Administered   Influenza Split 04/11/2012   Influenza,inj,Quad PF,6+ Mos 08/02/2018   Influenza,trivalent, recombinat, inj, PF 08/01/2011   Td 07/19/1999    Health Maintenance  Topic Date Due   COVID-19 Vaccine (1) Never done   TETANUS/TDAP  07/18/2009   Diabetic kidney evaluation - Urine ACR  12/30/2016   OPHTHALMOLOGY EXAM  10/19/2017   COLONOSCOPY (Pts 45-71yr Insurance coverage will need to be confirmed)  04/14/2021   FOOT EXAM  01/06/2022   PAP SMEAR-Modifier  05/29/2022   INFLUENZA VACCINE  10/15/2022 (Originally 02/14/2022)   HEMOGLOBIN A1C  10/17/2022   Diabetic kidney evaluation - GFR measurement  04/18/2023   Hepatitis C Screening  Completed   HIV Screening  Completed   HPV VACCINES  Aged Out    Patient Care Team: OMardene Graham PHershal Coriaas PCP - General (Physician Assistant) AElouise Munroe MD as PCP - Cardiology (Cardiology) BMelina Schools MD  as Consulting Physician (Orthopedic Surgery)  Review of Systems  All other systems reviewed and are negative. Except see HPI     Objective    BP (!) 156/93 (BP Location: Right Arm, Patient Position: Sitting, Cuff Size: Normal)   Pulse (!) 118   Temp 98.4 F (36.9 C) (Oral)   Resp 16   Ht _0  (1.651 m)   Wt 119 lb 14.4 oz (54.4 kg)   SpO2 100%   BMI 19.95 kg/m    Physical  Exam Vitals reviewed.  Constitutional:      General: She is not in acute distress.    Appearance: Normal appearance.  HENT:     Head: Normocephalic.  Pulmonary:     Effort: Pulmonary effort is normal. No respiratory distress.  Neurological:     Mental Status: She is alert and oriented to person, place, and time. Mental status is at baseline.  Psychiatric:        Behavior: Behavior normal.        Thought Content: Thought content normal.        Judgment: Judgment normal.     Depression Screen    05/31/2022    2:28 PM 10/03/2018   10:21 AM 12/07/2015   10:47 AM  PHQ 2/9 Scores  PHQ - 2 Score 3 0 0  PHQ- 9 Score 9     No results found for any visits on 05/31/22.  Assessment & Plan     1. Hypertension, unspecified type Chronic and unstable Her Bp today was 156/93 Per misc reports review, questioned her compliance with her current medications. Per pt, she has been taking all medications and needed a refill for cholesterol medication only. Amlodipine was last dispensed on 02/27/22/#90 Refill: - atorvastatin (LIPITOR) 10 MG tablet; Take 1 tablet (10 mg total) by mouth at bedtime.  Dispense: 90 tablet; Refill: 1 - amLODipine (NORVASC) 10 MG tablet; Take 1 tablet (10 mg total) by mouth daily.  Dispense: 90 tablet; Refill: 0 Advised an adherence to low-salt diet/daily exercise Will fu in 2 weeks if pt agrees.  Per chart review, cardiology office was unable to reach her on 12/02/21  2. Elevated cholesterol Chronic and unsure if stable - atorvastatin (LIPITOR) 10 MG tablet; Take 1 tablet (10 mg total) by mouth at bedtime.  Dispense: 90 tablet; Refill: 1 Questioning her compliance. Last dispensed on 12/03/21/#90 Advised to update Lipid test Advised an adherence to low-cholesterol, low-carb, low-calorie diet/daily exercise Will FU in 2 weeks  3. Anxiety Chronic and unstable. Pt requested xanax, seemed that she had no concerns about any other drug refills/compliance GAD7 score is  18, severe anxiety PHQ9 score is 9, mild depression Advised to continue with Zoloft daily. Pt reassured me that her current dose is 50-32m daily. In the past she tried buspar, other SSRIs , hydroxyzine without relief. Claims that only xanax can help with anxiety. - Ambulatory referral to Psychiatry Per pt, her pain medication was d/c by a pain specialist after negative drug test. Pt planned to contact her BH/online and ask for xanax as she is not taking hydrocodone anymore and [place].  4. Other insomnia Could be related to anxiety or grief - Ambulatory referral to Psychiatry for assessment and grief management Per pt she established care with online Psychiatry and they were prescribing her Zoloft 255mper chart review She will contact her psychiatrist to request xanax. Her provider at pain clinic stopped Rx hydrocodone after negative drug test. Advise to take  Zoloft 56m or 755mif she took 25 mg or 50 mg before. Will fu if she will agree  5. Establish care Pt was requested to bring her old records to her FU Pt was seen at ED for malaise and poor oral intake since d/c from xanax on 04/16/22. Per chart review, her symptoms were attributed to grief / due to a recent death of her husband and discontinuation of xanax with labs showing mild hyperglycemia with hyponatremia. She was given 2L of normal saline, oral Valium. CT chest showed signs of pneumonia and was d/c on azithromycin and with reassurance that "she is not having significant BZ withdrawal symptoms or she is at risk for any serious side effects."  6. Diabetes type 1  Chronic and uncontrolled, on insulin pump Her last A1C was 9.4 from 04/17/22 Continue insulin, unclear if she is compliant. Last FU with her endocrinology was on 04/20/22,  Pt was advised to switch from sertraline to Effexor 2/2 hyponatremia, a refill for xanax/#30 was placed Continue FU with endocrinology Will refer to diabetes  educator  Return in about 2 weeks (around 06/14/2022) for BP f/u.    Per chart review, pt was scheduled a new pt visit with a different PCP on 08/2022.  The patient was advised to call back or seek an in-person evaluation if the symptoms worsen or if the condition fails to improve as anticipated.  I discussed the assessment and treatment plan with the patient. The patient was provided an opportunity to ask questions and all were answered. The patient agreed with the plan and demonstrated an understanding of the instructions.  The entirety of the information documented in the History of Present Illness, Review of Systems and Physical Exam were personally obtained by me. Portions of this information were initially documented by the CMA and reviewed by me for thoroughness and accuracy.     JaMardene SpeakPANantucket Cottage [phone]) [phone])

## 2022-06-02 DIAGNOSIS — E7800 Pure hypercholesterolemia, unspecified: Secondary | ICD-10-CM

## 2022-06-02 DIAGNOSIS — E109 Type 1 diabetes mellitus without complications: Secondary | ICD-10-CM | POA: Insufficient documentation

## 2022-06-02 DIAGNOSIS — E785 Hyperlipidemia, unspecified: Secondary | ICD-10-CM | POA: Insufficient documentation

## 2022-06-02 DIAGNOSIS — Z7689 Persons encountering health services in other specified circumstances: Secondary | ICD-10-CM | POA: Insufficient documentation

## 2022-06-02 HISTORY — DX: Pure hypercholesterolemia, unspecified: E78.00

## 2022-06-02 HISTORY — DX: Persons encountering health services in other specified circumstances: Z76.89

## 2022-07-07 ENCOUNTER — Other Ambulatory Visit: Payer: Self-pay

## 2022-07-07 DIAGNOSIS — E1065 Type 1 diabetes mellitus with hyperglycemia: Secondary | ICD-10-CM

## 2022-07-20 ENCOUNTER — Ambulatory Visit (HOSPITAL_COMMUNITY): Payer: Medicaid Other | Admitting: Psychiatry

## 2022-07-31 DIAGNOSIS — M25561 Pain in right knee: Secondary | ICD-10-CM | POA: Insufficient documentation

## 2022-08-01 ENCOUNTER — Ambulatory Visit: Payer: Medicaid Other | Admitting: Obstetrics & Gynecology

## 2022-08-03 ENCOUNTER — Telehealth: Payer: Self-pay | Admitting: Internal Medicine

## 2022-08-03 NOTE — Telephone Encounter (Signed)
Pt c/o BP issue: STAT if pt c/o blurred vision, one-sided weakness or slurred speech  1. What are your last 5 BP readings?  137/84 172/99  2. Are you having any other symptoms (ex. Dizziness, headache, blurred vision, passed out)? No  3. What is your BP issue? Pt states that BP has been elevated for the last 6 months

## 2022-08-03 NOTE — Telephone Encounter (Signed)
Spoke to patient she stated her B/P has been elevated the past several months.Stated she has not been taking care of herself.Stated she recently lost her father and her husband within a few months apart.She saw PCP back in Nov 2023 and she increased Amlodipine to 10 mg daily.She has been anxious and shaky.Stated she has been eating a lot of fast food.Advised to eat a better diet,low sodium.Patient reassured.Advised to keep a diary of B/P readings and bring to appointment already scheduled with Christen Bame NP 1/24 at 1:55 pm at [facility] office.

## 2022-08-04 ENCOUNTER — Telehealth: Payer: Self-pay

## 2022-08-04 NOTE — Telephone Encounter (Signed)
FYI. Pt called to request medication for yeast infection, states she is a diabetic and get these frequently.  Pt advised no providers currently in office. I recommended she try UC visit, visit on demand if insurance covers, and/or OTC options until able to be seen. Pt reported she has an upcomming appt and will get it addressed then.

## 2022-08-08 ENCOUNTER — Ambulatory Visit: Payer: Medicaid Other | Admitting: Nurse Practitioner

## 2022-08-09 ENCOUNTER — Ambulatory Visit: Payer: Medicaid Other | Admitting: Nurse Practitioner

## 2022-08-10 ENCOUNTER — Ambulatory Visit (HOSPITAL_COMMUNITY): Payer: Medicaid Other | Admitting: Psychiatry

## 2022-08-10 NOTE — Progress Notes (Signed)
Cardiology Clinic Note   Patient Name: [name] Date of Encounter: 08/11/2022  Primary Care Provider:  Carollee Leitz, MD Primary Cardiologist:  Alejandra Munroe, MD  Patient Profile    47 year old female past medical history of HTN, asthma, depression, type 1 diabetes, endometriosis, GERD, HLD, and dyspnea. Low risk NM study 2021 and Coronary calcium score of 0. Last seen in the office by Alejandra Memos, NP on 04/19/2020.   Past Medical History    Past Medical History:  Diagnosis Date   Anxiety state, unspecified 03/26/2009   ASTHMA 02/02/2007   Asthma    DEPRESSION 02/16/2009   DIABETES MELLITUS, TYPE I 02/02/2007   Endometriosis    GERD 12/21/2008   Herpes    at 18   Hyperlipidemia    Lesion of vocal cord    Osteoarthritis    Severe nonproliferative diabetic retinopathy of right eye, without macular edema, associated with type 1 diabetes mellitus ([place]) 11/04/2019   Shortness of breath 09/25/2007   Vocal fold leukoplakia    Past Surgical History:  Procedure Laterality Date   ABDOMINAL HYSTERECTOMY  2009   BSO   DIRECT LARYNGOSCOPY N/A 08/17/2017   Procedure: DIRECT LARYNGOSCOPY WITH OPERATING TELESCOPE WITH BIOPSY;  Surgeon: Helayne Seminole, MD;  Location: [place];  Service: ENT;  Laterality: N/A;   NASOPHARYNGEAL BIOPSY Right 08/17/2017   Procedure: NASOPHARYNGEAL BIOPSY RIGHT NASAL LESION;  Surgeon: Helayne Seminole, MD;  Location: [place];  Service: ENT;  Laterality: Right;    Allergies  No Known Allergies  History of Present Illness    [name] is a 47 year female we are seeing for ongoing assessment and management of Hypertension, HL, atypical chest pain.  She comes today extremely distraught, tearful, explaining that her husband died in 04/18/23 and she continues to grieve it.  She has been having panic attacks and chest pain with rapid heart rate since that time.  She has been placed on antianxiety medications and plans to see a therapist the first part  of February.  She has neglected her health because she has been focusing on her husband until his death.  He died of small cell lung CA.  She is now at a point where she is ready to concentrate on her own health despite her grieving.  She is also concerned that she has a lot of chest congestion.  She has been smoking more heavily since her husband died and has been coughing up a lot of mucus and noticed more wheezing.  She is concerned that she may have some bronchitis or viral pneumonia.  Home Medications    Current Outpatient Medications  Medication Sig Dispense Refill   ALPRAZolam (XANAX) 0.5 MG tablet 1 tablet up to twice a day for anxiety, do not exceed dose 30 tablet 0   atorvastatin (LIPITOR) 10 MG tablet Take 1 tablet (10 mg total) by mouth at bedtime. 90 tablet 1   Blood Glucose Monitoring Suppl (ACCU-CHEK GUIDE ME) w/Device KIT Use to check blood sugar daily 1 kit 0   Cholecalciferol (VITAMIN D3) 50 MCG (2000 UT) capsule Take 2,000 Units by mouth daily.     Continuous Blood Gluc Sensor (DEXCOM G6 SENSOR) MISC 1 each by Does not apply route See admin instructions. Change sensor every 10 days; E10.9 9 each 3   Continuous Blood Gluc Transmit (DEXCOM G6 TRANSMITTER) MISC Use as instructed to check blood sugars, change every 3 months. 1 each 3   gabapentin (NEURONTIN) 300  MG capsule Take 1 capsule (300 mg total) by mouth 3 (three) times daily. As needed 270 capsule 1   glucagon 1 MG injection Inject 1 mg into the muscle once as needed. 1 each 12   Ibuprofen (ADVIL PO) Take 2-3 tablets by mouth every 6 (six) hours as needed.     insulin aspart (NOVOLOG) 100 UNIT/ML FlexPen Inject 2 Units into the skin 3 (three) times daily with meals. 15 mL 0   insulin aspart (NOVOLOG) 100 UNIT/ML injection Inject 0-9 Units into the skin 3 (three) times daily with meals. CBG 70 - 120: 0 units CBG 121 -150: 0 units CBG 151-200: 0 units CBG 201-250: 2 units CBG 251-300: 3 units CBG 301-350: 4 units CBG  351-400: 5 units CBG greater than 400: Call MD for additional instructions or go to the emergency room 10 mL    insulin glargine (LANTUS) 100 UNIT/ML Solostar Pen Inject 10 Units into the skin daily. 15 mL 0   Insulin Pen Needle (PEN NEEDLES) 32G X 5 MM MISC Use as instructed 100 each 0   ipratropium (ATROVENT HFA) 17 MCG/ACT inhaler Inhale 2 puffs into the lungs every 4 (four) hours as needed for wheezing. 1 each 3   Multiple Vitamin (MULTIVITAMIN WITH MINERALS) TABS tablet Take 1 tablet by mouth daily.     omeprazole (PRILOSEC) 40 MG capsule TAKE 1 CAPSULE BY MOUTH TWICE A DAY 180 capsule 1   ondansetron (ZOFRAN-ODT) 4 MG disintegrating tablet Take 1 tablet (4 mg total) by mouth every 8 (eight) hours as needed for nausea or vomiting. 20 tablet 0   rOPINIRole (REQUIP) 1 MG tablet TAKE 1 TABLET BY MOUTH AT BEDTIME (PLZ SCHED APPT WITH NEW PROVIDER)  Please make appointment with Dr. Rex Kras.                 Needs to make appointment with Dr. Rex Kras. (Patient taking differently: Take 1 mg by mouth at bedtime. TAKE 1 TABLET BY MOUTH AT BEDTIME (PLZ SCHED APPT WITH NEW PROVIDER)  Please make appointment with Dr. Rex Kras.                 Needs to make appointment with Dr. Rex Kras.) 90 tablet 0   sertraline (ZOLOFT) 25 MG tablet Take 50 mg by mouth daily.     VENTOLIN HFA 108 (90 Base) MCG/ACT inhaler      amLODipine (NORVASC) 10 MG tablet Take 1 tablet (10 mg total) by mouth daily. 90 tablet 3   No current facility-administered medications for this visit.     Family History    Family History  Problem Relation Age of Onset   Diabetes Mother    Diabetes Father    Diabetes type I Brother    Diabetes Maternal Aunt    Emphysema Maternal Uncle    Diabetes Maternal Grandfather    Colon cancer Neg Hx    Kidney disease Neg Hx    Liver disease Neg Hx    Esophageal cancer Neg Hx    Rectal cancer Neg Hx    Stomach cancer Neg Hx    She indicated that her mother is  alive. She indicated that her father is deceased. She indicated that both of her brothers are alive. She indicated that the status of her maternal grandfather is unknown. She indicated that her daughter is alive. She indicated that her son is alive. She indicated that the status of her maternal uncle is unknown. She indicated that the status of her neg hx  is unknown.  Social History    Social History   Socioeconomic History   Marital status: Legally Separated    Spouse name: Not on file   Number of children: 2   Years of education: Not on file   Highest education level: Not on file  Occupational History    Employer: UNEMPLOYED  Tobacco Use   Smoking status: Every Day    Packs/day: 1.50    Years: 31.00    Total pack years: 46.50    Types: Cigarettes   Smokeless tobacco: Never   Tobacco comments:    11/03/2021 -  states she smokes a pack and a half a day  Vaping Use   Vaping Use: Former   Quit date: 02/23/2017   Devices: only used tobacco   Substance and Sexual Activity   Alcohol use: Not Currently    Alcohol/week: 0.0 standard drinks of alcohol   Drug use: No   Sexual activity: Yes    Partners: Male    Birth control/protection: None    Comment: 1st intercourse-14, partners- 49, current partner- 4 months  Other Topics Concern   Not on file  Social History Narrative   2 children--pt has custody. Husband pays child support.   Daily Caffeine Use-3 2 liters a day   Pt does not get regular exercise.   Social Determinants of Health   Financial Resource Strain: Not on file  Food Insecurity: Not on file  Transportation Needs: Not on file  Physical Activity: Not on file  Stress: Not on file  Social Connections: Not on file  Intimate Partner Violence: Not on file     Review of Systems    General:  No chills, fever, night sweats or weight changes.  Cardiovascular:  No chest pain, dyspnea on exertion, edema, orthopnea, positive for rapid palpitations, associated with anxiety,  paroxysmal nocturnal dyspnea. Dermatological: No rash, lesions/masses Respiratory: No cough, dyspnea Urologic: No hematuria, dysuria Abdominal:   No nausea, vomiting, diarrhea, bright red blood per rectum, melena, or hematemesis Neurologic:  No visual changes, wkns, changes in mental status. All other systems reviewed and are otherwise negative except as noted above.     Physical Exam    VS:  BP 132/62   Pulse 94   Ht '5\' 4"'$  (1.626 m)   Wt 126 lb 3.2 oz (57.2 kg)   BMI 21.66 kg/m  , BMI Body mass index is 21.66 kg/m.     GEN: Well nourished, well developed, in no acute distress. HEENT: normal. Neck: Supple, no JVD, carotid bruits, or masses. Cardiac: RRR tachycardic, no murmurs, rubs, or gallops. No clubbing, cyanosis, edema.  Radials/DP/PT 2+ and equal bilaterally.  Respiratory:  Respirations regular and unlabored, clear to auscultation bilaterally. GI: Soft, nontender, nondistended, BS + x 4. MS: no deformity or atrophy.  Insulin pump in place.  Glucose monitor on upper left arm. Skin: warm and dry, no rash. Neuro:  Strength and sensation are intact. Psych: Crying, openly distraught.  Accessory Clinical Findings    ECG personally reviewed by me today-not completed in the office today.  Lab Results  Component Value Date   WBC 9.1 04/16/2022   HGB 13.4 04/16/2022   HCT 37.7 04/16/2022   MCV 84.7 04/16/2022   PLT 208 04/16/2022   Lab Results  Component Value Date   CREATININE 0.74 04/17/2022   BUN 5 (L) 04/17/2022   NA 124 (L) 04/17/2022   K 4.1 04/17/2022   CL 89 (L) 04/17/2022   CO2 27 04/17/2022  Lab Results  Component Value Date   ALT 11 04/16/2022   AST 16 04/16/2022   ALKPHOS 67 04/16/2022   BILITOT 0.5 04/16/2022   Lab Results  Component Value Date   CHOL 220 (H) 05/12/2010   HDL 49.90 05/12/2010   LDLDIRECT 142.7 05/12/2010   TRIG 200.0 (H) 05/12/2010   CHOLHDL 4 05/12/2010    Lab Results  Component Value Date   HGBA1C 9.4 (H) 04/17/2022     Review of Prior Studies: Coronary CTA 04/19/2020 IMPRESSION: Coronary calcium score of 0.   Patient is low risk for near term coronary events   Nuclear stress test 05/27/2020 Nuclear stress EF: 70%. Blood pressure demonstrated a normal response to exercise. No T wave inversion was noted during stress. There was no ST segment deviation noted during stress. This is a low risk study.   Normal perfusion. LVEF 70% with normal wall motion. This is a low risk study.    Assessment & Plan   1.  Hypertension: She has noticed that her blood pressure has been elevated especially around the time of her husband's death and with grieving.  However it has started to become better controlled since she has been placed on antianxiety medication.  Will continue her on amlodipine 10 mg daily as directed.  No planned cardiac testing at this time.  2.  Insulin-dependent diabetes: She has an insulin pump which is followed by endocrinologist Dr. Dwyane Dee.  Defer  3.  Situational anxiety: Continues to mourn the death of her husband for the last 6 months.  Visibly distraught here in the office.  She is on antianxiety medication and plans to see psychotherapist on February 1.  I have encouraged her to follow-up with this.  Current medicines are reviewed at length with the patient today.  I have spent 25 min's  dedicated to the care of this patient on the date of this encounter to include pre-visit review of records, assessment, management and diagnostic testing,with shared decision making.  Signed, Phill Myron. West Pugh, ANP, AACC   08/11/2022 3:00 PM      Office [phone] Fax [phone]  Notice: This dictation was prepared with Dragon dictation along with smaller phrase technology. Any transcriptional errors that result from this process are unintentional and may not be corrected upon review.

## 2022-08-11 ENCOUNTER — Ambulatory Visit: Payer: Medicaid Other | Attending: Nurse Practitioner | Admitting: Adult Health

## 2022-08-11 ENCOUNTER — Encounter: Payer: Self-pay | Admitting: Adult Health

## 2022-08-11 VITALS — BP 132/62 | HR 94 | Ht 64.0 in | Wt 126.2 lb

## 2022-08-11 DIAGNOSIS — F17200 Nicotine dependence, unspecified, uncomplicated: Secondary | ICD-10-CM | POA: Diagnosis present

## 2022-08-11 DIAGNOSIS — E1165 Type 2 diabetes mellitus with hyperglycemia: Secondary | ICD-10-CM | POA: Insufficient documentation

## 2022-08-11 DIAGNOSIS — Z794 Long term (current) use of insulin: Secondary | ICD-10-CM | POA: Diagnosis present

## 2022-08-11 DIAGNOSIS — I10 Essential (primary) hypertension: Secondary | ICD-10-CM | POA: Insufficient documentation

## 2022-08-11 MED ORDER — AMLODIPINE BESYLATE 10 MG PO TABS: 10.0000 mg | ORAL_TABLET | Freq: Every day | ORAL | 3 refills | Status: DC

## 2022-08-11 NOTE — Patient Instructions (Signed)
Medication Instructions:  No Changes *If you need a refill on your cardiac medications before your next appointment, please call your pharmacy*   Lab Work: No Labs If you have labs (blood work) drawn today and your tests are completely normal, you will receive your results only by: [place] (if you have MyChart) OR A paper copy in the mail If you have any lab test that is abnormal or we need to change your treatment, we will call you to review the results.   Testing/Procedures: [facility] , [address]. A chest x-ray takes a picture of the organs and structures inside the chest, including the heart, lungs, and blood vessels. This test can show several things, including, whether the heart is enlarges; whether fluid is building up in the lungs; and whether pacemaker / defibrillator leads are still in place.    Follow-Up: At [facility], you and your health needs are our priority.  As part of our continuing mission to provide you with exceptional heart care, we have created designated Provider Care Teams.  These Care Teams include your primary Cardiologist (physician) and Advanced Practice Providers (APPs -  Physician Assistants and Nurse Practitioners) who all work together to provide you with the care you need, when you need it.  We recommend signing up for the patient portal called "MyChart".  Sign up information is provided on this After Visit Summary.  MyChart is used to connect with patients for Virtual Visits (Telemedicine).  Patients are able to view lab/test results, encounter notes, upcoming appointments, etc.  Non-urgent messages can be sent to your provider as well.   To learn more about what you can do with MyChart, go to [email or web address].    Your next appointment:   1 year(s)  Provider:   Elouise Munroe, MD

## 2022-08-14 DIAGNOSIS — M170 Bilateral primary osteoarthritis of knee: Secondary | ICD-10-CM | POA: Insufficient documentation

## 2022-08-15 ENCOUNTER — Telehealth: Payer: Self-pay | Admitting: Internal Medicine

## 2022-08-15 ENCOUNTER — Ambulatory Visit
Admission: RE | Admit: 2022-08-15 | Discharge: 2022-08-15 | Disposition: A | Discharge status: Home / Self Care | Payer: Medicaid Other | Source: Ambulatory Visit | Attending: Adult Health | Admitting: Adult Health

## 2022-08-15 DIAGNOSIS — F40240 Claustrophobia: Secondary | ICD-10-CM | POA: Insufficient documentation

## 2022-08-15 DIAGNOSIS — I10 Essential (primary) hypertension: Secondary | ICD-10-CM

## 2022-08-15 NOTE — Telephone Encounter (Signed)
Contacted patient, verbalized location for XRAY.   Patient verbalized understanding, thankful for call back

## 2022-08-15 NOTE — Telephone Encounter (Signed)
Patient would like a call back to confirm the location for her DG Chest 2 View test is [facility] [place]. Patient stated can leave VM.

## 2022-08-17 ENCOUNTER — Other Ambulatory Visit: Payer: Medicaid Other

## 2022-08-17 ENCOUNTER — Encounter: Payer: Self-pay | Admitting: Family Medicine

## 2022-08-17 ENCOUNTER — Ambulatory Visit (INDEPENDENT_AMBULATORY_CARE_PROVIDER_SITE_OTHER): Payer: Medicaid Other | Admitting: Family Medicine

## 2022-08-17 VITALS — BP 128/72 | HR 76 | Temp 98.1°F | Ht 64.0 in | Wt 125.4 lb

## 2022-08-17 DIAGNOSIS — E103492 Type 1 diabetes mellitus with severe nonproliferative diabetic retinopathy without macular edema, left eye: Secondary | ICD-10-CM | POA: Diagnosis not present

## 2022-08-17 DIAGNOSIS — I10 Essential (primary) hypertension: Secondary | ICD-10-CM

## 2022-08-17 DIAGNOSIS — I700 Atherosclerosis of aorta: Secondary | ICD-10-CM

## 2022-08-17 DIAGNOSIS — E109 Type 1 diabetes mellitus without complications: Secondary | ICD-10-CM | POA: Diagnosis not present

## 2022-08-17 DIAGNOSIS — N6310 Unspecified lump in the right breast, unspecified quadrant: Secondary | ICD-10-CM

## 2022-08-17 DIAGNOSIS — G2581 Restless legs syndrome: Secondary | ICD-10-CM

## 2022-08-17 DIAGNOSIS — J4489 Other specified chronic obstructive pulmonary disease: Secondary | ICD-10-CM

## 2022-08-17 DIAGNOSIS — E1159 Type 2 diabetes mellitus with other circulatory complications: Secondary | ICD-10-CM | POA: Diagnosis not present

## 2022-08-17 DIAGNOSIS — I152 Hypertension secondary to endocrine disorders: Secondary | ICD-10-CM

## 2022-08-17 DIAGNOSIS — K219 Gastro-esophageal reflux disease without esophagitis: Secondary | ICD-10-CM

## 2022-08-17 DIAGNOSIS — F1320 Sedative, hypnotic or anxiolytic dependence, uncomplicated: Secondary | ICD-10-CM

## 2022-08-17 DIAGNOSIS — L309 Dermatitis, unspecified: Secondary | ICD-10-CM

## 2022-08-17 DIAGNOSIS — J432 Centrilobular emphysema: Secondary | ICD-10-CM

## 2022-08-17 DIAGNOSIS — R059 Cough, unspecified: Secondary | ICD-10-CM

## 2022-08-17 DIAGNOSIS — E103491 Type 1 diabetes mellitus with severe nonproliferative diabetic retinopathy without macular edema, right eye: Secondary | ICD-10-CM

## 2022-08-17 DIAGNOSIS — R911 Solitary pulmonary nodule: Secondary | ICD-10-CM

## 2022-08-17 DIAGNOSIS — E042 Nontoxic multinodular goiter: Secondary | ICD-10-CM

## 2022-08-17 DIAGNOSIS — E1143 Type 2 diabetes mellitus with diabetic autonomic (poly)neuropathy: Secondary | ICD-10-CM

## 2022-08-17 DIAGNOSIS — E871 Hypo-osmolality and hyponatremia: Secondary | ICD-10-CM

## 2022-08-17 DIAGNOSIS — E1142 Type 2 diabetes mellitus with diabetic polyneuropathy: Secondary | ICD-10-CM

## 2022-08-17 DIAGNOSIS — K3184 Gastroparesis: Secondary | ICD-10-CM

## 2022-08-17 MED ORDER — CETIRIZINE HCL 10 MG PO TABS: 10.0000 mg | ORAL_TABLET | Freq: Every day | ORAL | 11 refills | Status: DC

## 2022-08-17 MED ORDER — FLUTICASONE PROPIONATE 50 MCG/ACT NA SUSP: 2.0000 | Freq: Every day | NASAL | 6 refills | Status: DC

## 2022-08-17 NOTE — Progress Notes (Signed)
SUBJECTIVE:   Chief Complaint  Patient presents with   Establish Care   HPI Patient presents to clinic to establish care.  Cough. Symptoms started 2 weeks ago.  Hoarse voice.  Cough is worse in the morning.  Nonproductive cough. Reports no thyroid disease history.  Denies any fevers, shortness of breath, chest pain, palpitations, nausea/vomiting, recent sick contacts.  Recent chest x-ray did not show any evidence of pneumonia.   Hypertension Asymptomatic.  Currently taking amlodipine 10 mg daily.  Not checking blood pressures at home.  Follows at cardiology in [place].  Hyperlipidemia Prescribed Lipitor 10 mg daily.  Patient reports not taking medication.  Recent coronary calcium score 0.  DM type I CBGs 148.  Uses Dexcom.  Also has insulin pump.  Basal rate 0.2.  Follows with Dr. Dwyane Dee endocrinology.    Diabetic retinopathy Follows with ophthalmology in [place].  Mood disorder History anxiety/depression.  Recently added to that is loss of husband to small cell lung cancer.  Currently on Zoloft 75 mg daily and Klonopin 0.5 mg a.m. and 0.25 mg p.m.  Follows with psychiatry in [place].  Denies any SI/HI.   Asthma/COPD overlap Uses inhaler daily.  Patient unaware medication name.  Per last pulmonology note appears she was ordered Spiriva.  Atrovent discontinued secondary to side effect of jitteriness.  Patient reports not taking ipratropium inhaler.  Continued tobacco use, having increased recently with death of husband.  GERD Takes Omeprazole 40 mg BID. Follows with GI.    Hoarseness Reports worsening voice hoarseness. Chart review shows that patient evaluated by ENT, Dr Blenda Nicely, 09/16/18 for vocal cord leukoplakia.  Pathology results demonstrated low-grade dysplasia. F/u at 3 months no changes.  Last seen 12/24/18 with recommendations to f/u 6 mths.  Requiring serial examinations.        PERTINENT PMH / PSH: Hypertension Asthma Type 1  diabetes GERD Hyperlipidemia Mood disorder TAH-2009 Excision of bilateral vocal cord leukoplakia,    OBJECTIVE:  BP 128/72   Pulse 76   Temp 98.1 F (36.7 C)   Ht '5\' 4"'$  (1.626 m)   Wt 125 lb 6.4 oz (56.9 kg)   SpO2 95%   BMI 21.52 kg/m    Physical Exam Vitals reviewed.  Constitutional:      General: She is not in acute distress.    Appearance: She is not ill-appearing.  HENT:     Head: Normocephalic.     Right Ear: Tympanic membrane, ear canal and external ear normal.     Left Ear: Tympanic membrane, ear canal and external ear normal.     Nose: Nose normal.     Mouth/Throat:     Mouth: Mucous membranes are moist.     Pharynx: Oropharynx is clear. Posterior oropharyngeal erythema present. No pharyngeal swelling, oropharyngeal exudate or uvula swelling.     Tonsils: No tonsillar exudate.  Eyes:     Extraocular Movements: Extraocular movements intact.     Conjunctiva/sclera: Conjunctivae normal.     Pupils: Pupils are equal, round, and reactive to light.  Neck:     Vascular: No carotid bruit.  Cardiovascular:     Rate and Rhythm: Normal rate and regular rhythm.     Pulses: Normal pulses.     Heart sounds: Normal heart sounds.  Pulmonary:     Effort: Pulmonary effort is normal.     Breath sounds: Normal breath sounds.  Abdominal:     General: Bowel sounds are normal. There is no distension.     Palpations:  Abdomen is soft.     Tenderness: There is no abdominal tenderness. There is no right CVA tenderness, left CVA tenderness, guarding or rebound.  Musculoskeletal:        General: Normal range of motion.     Cervical back: Normal range of motion and neck supple. Tenderness present. No rigidity.     Right lower leg: No edema.     Left lower leg: No edema.  Lymphadenopathy:     Cervical: No cervical adenopathy.     Right cervical: No superficial cervical adenopathy.    Left cervical: No superficial cervical adenopathy.  Skin:    General: Skin is warm.      Capillary Refill: Capillary refill takes less than 2 seconds.  Neurological:     General: No focal deficit present.     Mental Status: She is alert and oriented to person, place, and time. Mental status is at baseline.     Motor: No weakness.  Psychiatric:        Mood and Affect: Mood normal.        Behavior: Behavior normal.        Thought Content: Thought content normal.        Judgment: Judgment normal.     ASSESSMENT/PLAN:  Type 1 diabetes mellitus without complication (HCC) Assessment & Plan: Chronic. On Insulin pump, basal rate 0.2 Dexcom readings 140's Not on ACEi/ARB Not taking prescribed statin, Calcium score 0 Follows with Endocrinology   Hypertension associated with diabetes [facility]) Assessment & Plan: Chronic. Asymptomatic.  Currently on CCB and tolerating well.  Not on ACEi or ARB.  Was previously on Lisinopril 10 mg daily.  Unclear why discontinued. Will discuss at next visit Follows with Cardiology and Endocrinology   Severe nonproliferative diabetic retinopathy of right eye, without macular edema, associated with type 1 diabetes mellitus [facility]) Assessment & Plan: Retinal eye exam 01/23 Due eye exam Follows with Dr Zadie Rhine   Severe nonproliferative diabetic retinopathy of left eye, without macular edema, associated with type 1 diabetes mellitus ([place])  Centrilobular emphysema ([place]) Assessment & Plan: Centrilobular and paraseptal emphysema noted on CT chest 07/31/19. On Spiriva Continued Tobacco use Follows with Pulmonology   Gastroparesis due to DM [facility]) Assessment & Plan: Chronic. Stable. Asymptomatic. EGD 2021 showing signs of gastroparesis.  Takes omeprazole  Follows with GI   Diabetic peripheral neuropathy (HCC) Assessment & Plan: Chronic Taking Gabapentin 300 mg TID and Ropinirole 1 mg qhs for RLS  Will follow up with foot exam at next visit   Benzodiazepine dependence ([place]) Assessment & Plan: Reports not taking Xanax Medication  discontinued. Currently on Klonapin Continue to follow with psychiatry   Aortic atherosclerosis [facility]) Assessment & Plan: Aortic atherosclerosis noted on CT chest 07/31/19.  Not currently taking prescribed statin therapy.  Calcium score 0. Not taking prescribed statin   Asthma-COPD overlap syndrome Assessment & Plan: Chronic. Stable.  Continued Tobacco use Recommend PNA 20 and Flu vaccine Continue Spiriva Encourage smoking cessation Follows with Pulmonology   Eczema, unspecified type  Cough, unspecified type Assessment & Plan: Likely secondary to increase in tobacco use.  Lungs clear on exam and oral exam normal Plan to check TSH at next visit. Follow up in 2 weeks, if no improvement plan to recommend she follow up with her ENT for her history of vocal cord leukoplakia.  Orders: -     Fluticasone Propionate; Place 2 sprays into both nostrils daily.  Dispense: 16 g; Refill: 6 -     Cetirizine HCl;  Take 1 tablet (10 mg total) by mouth daily.  Dispense: 30 tablet; Refill: 11  Lung nodule Assessment & Plan: Chronic.  Repeat CT chest 07/31/19 did not show significant changes.  No further recommended screening.   If symptomatic can obtain new imaging Follows with Pulmonology   Gastroesophageal reflux disease without esophagitis Assessment & Plan: Chronic. Stable on PPI.  EGD 2021 showing signs of gastroparesis.  Recommended trial of Reglan.  Also noted a 4 cm Hiatal Hernia Continue Omeprazole 40 mg BID Follows with GI   Multinodular goiter Assessment & Plan: Chronic. Stable.  FNA 02/23017.  Benign path report.  Was seen by Dr [name]. Last TSH low 0.313.  Unable to find repeat lab in care everywhere or Epic. Follow up in 2 weeks      Mass of right breast, unspecified quadrant Assessment & Plan: Diagnostic mammogram and u/s breast completed 03/2020.  No concern for malignancy Recommended yearly screening Will need mammogram this year   Hyponatremia Assessment  & Plan: Chronic. Asymptomatic. Following with Endocrinology    Restless leg Assessment & Plan: On Ropinirole 1 mg at night    HCM  Last diagnostic mammogram 9/21.  Ultrasound right breast normal.  Recommend yearly screening. Mammogram overdue  Colonoscopy 2009 . Due.  No alarm symptoms.  Last Pap 05/30/2019, NILM, HPV high risk detected.  Negative for 18/45 and 16 genotype. Pap due.  Follows at OB/GYN.  Recommend Tdap Recommend pneumonia 20 vaccine  PDMP reviewed  Return in about 2 weeks (around 08/31/2022).  Carollee Leitz, MD

## 2022-08-17 NOTE — Patient Instructions (Addendum)
It was a pleasure meeting you today. Thank you for allowing me to take part in your health care.  Our goals for today as we discussed include:  Will review your chart to make recommendations  Start Flonase 2 spray daily Start Zyrtec 10 mg daily  Recommend to quit smoking  Follow up in 2 weeks and bring in all medication bottles   If you have any questions or concerns, please do not hesitate to call the office at [phone].  I look forward to our next visit and until then take care and stay safe.  Regards,   Carollee Leitz, MD   [facility]

## 2022-08-18 ENCOUNTER — Telehealth: Payer: Self-pay

## 2022-08-18 NOTE — Telephone Encounter (Addendum)
Called patient regarding results. Patient had understanding of results.----- Message from Lendon Colonel, NP sent at 08/16/2022  5:06 PM EST ----- I have reviewed her chest x-ray report. There is no evidence of pneumonia. Her coughing and wheezing are likely related to her smoking. She will need to stop smoking to improve her breathing and her coughing in the morning, to avoid emphysema, COPD, or lung cancer.   KL

## 2022-08-19 ENCOUNTER — Encounter: Payer: Self-pay | Admitting: Family Medicine

## 2022-08-19 DIAGNOSIS — E1159 Type 2 diabetes mellitus with other circulatory complications: Secondary | ICD-10-CM | POA: Insufficient documentation

## 2022-08-19 DIAGNOSIS — E1143 Type 2 diabetes mellitus with diabetic autonomic (poly)neuropathy: Secondary | ICD-10-CM | POA: Insufficient documentation

## 2022-08-19 DIAGNOSIS — I700 Atherosclerosis of aorta: Secondary | ICD-10-CM | POA: Insufficient documentation

## 2022-08-19 DIAGNOSIS — R059 Cough, unspecified: Secondary | ICD-10-CM | POA: Insufficient documentation

## 2022-08-19 DIAGNOSIS — F1320 Sedative, hypnotic or anxiolytic dependence, uncomplicated: Secondary | ICD-10-CM | POA: Insufficient documentation

## 2022-08-19 DIAGNOSIS — J439 Emphysema, unspecified: Secondary | ICD-10-CM | POA: Insufficient documentation

## 2022-08-19 NOTE — Assessment & Plan Note (Signed)
Chronic. Asymptomatic. Following with Endocrinology

## 2022-08-19 NOTE — Assessment & Plan Note (Signed)
Reports not taking Xanax Medication discontinued. Currently on Klonapin Continue to follow with psychiatry

## 2022-08-19 NOTE — Assessment & Plan Note (Signed)
Retinal eye exam 01/23 Due eye exam Follows with Dr Zadie Rhine

## 2022-08-19 NOTE — Assessment & Plan Note (Signed)
Centrilobular and paraseptal emphysema noted on CT chest 07/31/19. On Spiriva Continued Tobacco use Follows with Pulmonology

## 2022-08-19 NOTE — Assessment & Plan Note (Addendum)
Chronic. On Insulin pump, basal rate 0.2 Dexcom readings 140's Not on ACEi/ARB Not taking prescribed statin, Calcium score 0 Follows with Endocrinology

## 2022-08-19 NOTE — Assessment & Plan Note (Signed)
Chronic. Stable on PPI.  EGD 2021 showing signs of gastroparesis.  Recommended trial of Reglan.  Also noted a 4 cm Hiatal Hernia Continue Omeprazole 40 mg BID Follows with GI

## 2022-08-19 NOTE — Assessment & Plan Note (Signed)
Aortic atherosclerosis noted on CT chest 07/31/19.  Not currently taking prescribed statin therapy.  Calcium score 0. Not taking prescribed statin

## 2022-08-19 NOTE — Assessment & Plan Note (Signed)
Likely secondary to increase in tobacco use.  Lungs clear on exam and oral exam normal Plan to check TSH at next visit. Follow up in 2 weeks, if no improvement plan to recommend she follow up with her ENT for her history of vocal cord leukoplakia.

## 2022-08-19 NOTE — Assessment & Plan Note (Signed)
Chronic.  Repeat CT chest 07/31/19 did not show significant changes.  No further recommended screening.   If symptomatic can obtain new imaging Follows with Pulmonology

## 2022-08-19 NOTE — Assessment & Plan Note (Addendum)
Chronic. Stable.  Continued Tobacco use Recommend PNA 20 and Flu vaccine Continue Spiriva Encourage smoking cessation Follows with Pulmonology

## 2022-08-19 NOTE — Assessment & Plan Note (Signed)
Chronic. Asymptomatic.  Currently on CCB and tolerating well.  Not on ACEi or ARB.  Was previously on Lisinopril 10 mg daily.  Unclear why discontinued. Will discuss at next visit Follows with Cardiology and Endocrinology

## 2022-08-19 NOTE — Assessment & Plan Note (Signed)
Diagnostic mammogram and u/s breast completed 03/2020.  No concern for malignancy Recommended yearly screening Will need mammogram this year

## 2022-08-19 NOTE — Assessment & Plan Note (Addendum)
Chronic. Stable.  FNA 02/23017.  Benign path report.  Was seen by Dr [name]. Last TSH low 0.313.  Unable to find repeat lab in care everywhere or Epic. Follow up in 2 weeks

## 2022-08-19 NOTE — Assessment & Plan Note (Signed)
On Ropinirole 1 mg at night

## 2022-08-19 NOTE — Assessment & Plan Note (Deleted)
Chronic. Stable.  Continued Tobacco use Recommend PNA 20 and Flu vaccine Continue Spiriva Encourage smoking cessation Follows with Pulmonology

## 2022-08-19 NOTE — Assessment & Plan Note (Signed)
Chronic. Stable. Asymptomatic. EGD 2021 showing signs of gastroparesis.  Takes omeprazole  Follows with GI

## 2022-08-19 NOTE — Assessment & Plan Note (Addendum)
Chronic Taking Gabapentin 300 mg TID and Ropinirole 1 mg qhs for RLS  Will follow up with foot exam at next visit

## 2022-08-21 ENCOUNTER — Ambulatory Visit: Payer: Medicaid Other | Admitting: Endocrinology

## 2022-08-24 ENCOUNTER — Telehealth: Payer: Self-pay | Admitting: Family Medicine

## 2022-08-24 NOTE — Telephone Encounter (Signed)
Pt scheduled for Monday.

## 2022-08-24 NOTE — Telephone Encounter (Signed)
Pt called in staying that she doesn't feel ok and would like for Dr. Volanda Napoleon to order some blood work to make sure everything its ok. She would like them to be done before her appt on 2/20. Any questions '@[phone].

## 2022-08-28 ENCOUNTER — Ambulatory Visit: Payer: Medicaid Other | Admitting: Family Medicine

## 2022-08-30 ENCOUNTER — Ambulatory Visit: Payer: Medicaid Other | Admitting: Nurse Practitioner

## 2022-08-31 DIAGNOSIS — M5416 Radiculopathy, lumbar region: Secondary | ICD-10-CM | POA: Insufficient documentation

## 2022-08-31 DIAGNOSIS — M4316 Spondylolisthesis, lumbar region: Secondary | ICD-10-CM | POA: Insufficient documentation

## 2022-08-31 DIAGNOSIS — M51369 Other intervertebral disc degeneration, lumbar region without mention of lumbar back pain or lower extremity pain: Secondary | ICD-10-CM | POA: Insufficient documentation

## 2022-08-31 DIAGNOSIS — M542 Cervicalgia: Secondary | ICD-10-CM | POA: Insufficient documentation

## 2022-09-05 ENCOUNTER — Ambulatory Visit: Payer: Medicaid Other | Admitting: Family Medicine

## 2022-09-06 ENCOUNTER — Ambulatory Visit: Payer: Medicaid Other | Admitting: Nurse Practitioner

## 2022-09-06 NOTE — Progress Notes (Signed)
Virtual Visit via Video note  I connected with [name] on 09/12/22 at 1630 by video and verified that I am speaking with the correct person using two identifiers. [name] is currently located at home and is currently alone during visit. The provider, Carollee Leitz, MD is located in their office at time of visit.  I discussed the limitations, risks, security and privacy concerns of performing an evaluation and management service by video and the availability of in person appointments. I also discussed with the patient that there may be a patient responsible charge related to this service. The patient expressed understanding and agreed to proceed.  Subjective: PCP: Carollee Leitz, MD  Chief Complaint  Patient presents with   Medical Management of Chronic Issues    Follow up 2 weeks    HPI Patient requesting refills of her medications.  Peripheral neuropathy Takes gabapentin.  Previously ordered by former PCP.  Takes gabapentin 300 mg 3 times daily.  Restless leg syndrome Requesting refill for ropinirole.  Requesting Diflucan for vaginal yeast infection.  Reports symptoms started 4 days ago.  Vaginal itching and white discharge.  Has had symptoms previously and treated with Diflucan resolved.  Not currently sexually active.  Sugars been elevated in 200s.  Last A1c 9.6.  Follows with endocrinology for type 1 diabetes.   ROS: Per HPI  Current Outpatient Medications:    ALPRAZolam (XANAX) 0.5 MG tablet, Take 0.5 mg by mouth daily as needed., Disp: , Rfl:    amLODipine (NORVASC) 10 MG tablet, Take 1 tablet (10 mg total) by mouth daily., Disp: 90 tablet, Rfl: 3   atorvastatin (LIPITOR) 10 MG tablet, Take 1 tablet (10 mg total) by mouth at bedtime., Disp: 90 tablet, Rfl: 1   Blood Glucose Monitoring Suppl (ACCU-CHEK GUIDE ME) w/Device KIT, Use to check blood sugar daily, Disp: 1 kit, Rfl: 0   cetirizine (ZYRTEC) 10 MG tablet, Take 1 tablet (10 mg total) by mouth daily.,  Disp: 30 tablet, Rfl: 11   Cholecalciferol (VITAMIN D3) 50 MCG (2000 UT) capsule, Take 2,000 Units by mouth daily., Disp: , Rfl:    Continuous Blood Gluc Sensor (DEXCOM G6 SENSOR) MISC, 1 each by Does not apply route See admin instructions. Change sensor every 10 days; E10.9, Disp: 9 each, Rfl: 3   Continuous Blood Gluc Transmit (DEXCOM G6 TRANSMITTER) MISC, Use as instructed to check blood sugars, change every 3 months., Disp: 1 each, Rfl: 3   diazepam (VALIUM) 10 MG tablet, 1 Po 1 hour prior to procedure, Disp: , Rfl:    diclofenac (VOLTAREN) 75 MG EC tablet, , Disp: , Rfl:    fluticasone (FLONASE) 50 MCG/ACT nasal spray, Place 2 sprays into both nostrils daily., Disp: 16 g, Rfl: 6   glucagon 1 MG injection, Inject 1 mg into the muscle once as needed., Disp: 1 each, Rfl: 12   Ibuprofen (ADVIL PO), Take 2-3 tablets by mouth every 6 (six) hours as needed., Disp: , Rfl:    Insulin Pen Needle (PEN NEEDLES) 32G X 5 MM MISC, Use as instructed, Disp: 100 each, Rfl: 0   ipratropium (ATROVENT HFA) 17 MCG/ACT inhaler, Inhale 2 puffs into the lungs every 4 (four) hours as needed for wheezing., Disp: 1 each, Rfl: 3   meloxicam (MOBIC) 15 MG tablet, Take 1 tablet every day by oral route with meal(s)., Disp: , Rfl:    Multiple Vitamin (MULTIVITAMIN WITH MINERALS) TABS tablet, Take 1 tablet by mouth daily., Disp: , Rfl:  omeprazole (PRILOSEC) 40 MG capsule, TAKE 1 CAPSULE BY MOUTH TWICE A DAY, Disp: 180 capsule, Rfl: 1   ondansetron (ZOFRAN-ODT) 4 MG disintegrating tablet, Take 1 tablet (4 mg total) by mouth every 8 (eight) hours as needed for nausea or vomiting., Disp: 20 tablet, Rfl: 0   QUEtiapine (SEROQUEL XR) 50 MG TB24 24 hr tablet, Take by mouth., Disp: , Rfl:    sertraline (ZOLOFT) 25 MG tablet, Take 50 mg by mouth daily., Disp: , Rfl:    traMADol (ULTRAM) 50 MG tablet, Take 50 mg by mouth every 6 (six) hours as needed., Disp: , Rfl:    traZODone (DESYREL) 100 MG tablet, PLEASE SEE ATTACHED FOR  DETAILED DIRECTIONS, Disp: , Rfl:    VENTOLIN HFA 108 (90 Base) MCG/ACT inhaler, , Disp: , Rfl:    gabapentin (NEURONTIN) 300 MG capsule, Take 1 capsule (300 mg total) by mouth 3 (three) times daily. As needed, Disp: 270 capsule, Rfl: 1   rOPINIRole (REQUIP) 1 MG tablet, Take 1 tablet (1 mg total) by mouth at bedtime. TAKE 1 TABLET BY MOUTH AT BEDTIME, Disp: 90 tablet, Rfl: 3  Observations/Objective: Physical Exam Pulmonary:     Effort: Pulmonary effort is normal.  Neurological:     Mental Status: She is alert and oriented to person, place, and time. Mental status is at baseline.  Psychiatric:        Mood and Affect: Mood normal.        Behavior: Behavior normal.        Thought Content: Thought content normal.        Judgment: Judgment normal.    Assessment and Plan: Diabetic peripheral neuropathy (HCC) -     Gabapentin; Take 1 capsule (300 mg total) by mouth 3 (three) times daily. As needed  Dispense: 270 capsule; Refill: 1  Restless leg -     rOPINIRole HCl; Take 1 tablet (1 mg total) by mouth at bedtime. TAKE 1 TABLET BY MOUTH AT BEDTIME  Dispense: 90 tablet; Refill: 3  Vaginal candida -     Fluconazole; Take 1 tablet (150 mg total) by mouth once for 1 dose.  Dispense: 1 tablet; Refill: 3   HCM Is aware of recommendations for screening. Mammogram due Diabetic eye exam due Urine ACR Colonoscopy 2009.  Due Diabetic foot exam due Tdap due Pap due follows with OB/GYN . Follow Up Instructions: Return in about 3 months (around 12/06/2022).   I discussed the assessment and treatment plan with the patient. The patient was provided an opportunity to ask questions and all were answered. The patient agreed with the plan and demonstrated an understanding of the instructions.   The patient was advised to call back or seek an in-person evaluation if the symptoms worsen or if the condition fails to improve as anticipated.  The above assessment and management plan was discussed with  the patient. The patient verbalized understanding of and has agreed to the management plan. Patient is aware to call the clinic if symptoms persist or worsen. Patient is aware when to return to the clinic for a follow-up visit. Patient educated on when it is appropriate to go to the emergency department.    Carollee Leitz, MD

## 2022-09-06 NOTE — Patient Instructions (Signed)
It was a pleasure meeting you today. Thank you for allowing me to take part in your health care.  Our goals for today as we discussed include:  Will order ultrasound to check for stones Take ibuprofen 600 mg every 8 hours for pain as needed  Will get some labs today and notify your results.  4 weeks If you have any questions or concerns, please do not hesitate to call the office at [phone].  I look forward to our next visit and until then take care and stay safe.  Regards,   Sherisa Gilvin, MD   [facility]  

## 2022-09-07 ENCOUNTER — Telehealth (INDEPENDENT_AMBULATORY_CARE_PROVIDER_SITE_OTHER): Payer: Medicaid Other | Admitting: Family Medicine

## 2022-09-07 ENCOUNTER — Encounter: Payer: Self-pay | Admitting: Family Medicine

## 2022-09-07 VITALS — Ht 64.0 in | Wt 125.0 lb

## 2022-09-07 DIAGNOSIS — E1142 Type 2 diabetes mellitus with diabetic polyneuropathy: Secondary | ICD-10-CM | POA: Diagnosis not present

## 2022-09-07 DIAGNOSIS — B3731 Acute candidiasis of vulva and vagina: Secondary | ICD-10-CM | POA: Diagnosis not present

## 2022-09-07 DIAGNOSIS — G2581 Restless legs syndrome: Secondary | ICD-10-CM | POA: Diagnosis not present

## 2022-09-07 MED ORDER — GABAPENTIN 300 MG PO CAPS: 300.0000 mg | ORAL_CAPSULE | Freq: Three times a day (TID) | ORAL | 1 refills | Status: DC

## 2022-09-07 MED ORDER — ROPINIROLE HCL 1 MG PO TABS: 1.0000 mg | ORAL_TABLET | Freq: Every day | ORAL | 3 refills | Status: DC

## 2022-09-07 MED ORDER — FLUCONAZOLE 150 MG PO TABS: 150.0000 mg | ORAL_TABLET | Freq: Once | ORAL | 3 refills | Status: AC

## 2022-09-12 ENCOUNTER — Encounter: Payer: Self-pay | Admitting: Family Medicine

## 2022-09-18 ENCOUNTER — Other Ambulatory Visit: Payer: Self-pay | Admitting: Internal Medicine

## 2022-09-19 LAB — HM DIABETES EYE EXAM

## 2022-09-20 ENCOUNTER — Encounter: Payer: Self-pay | Admitting: Endocrinology

## 2022-09-25 ENCOUNTER — Ambulatory Visit: Payer: Medicaid Other | Admitting: Obstetrics & Gynecology

## 2022-10-02 DIAGNOSIS — M47816 Spondylosis without myelopathy or radiculopathy, lumbar region: Secondary | ICD-10-CM | POA: Insufficient documentation

## 2022-10-05 ENCOUNTER — Ambulatory Visit: Payer: Medicaid Other | Admitting: Nurse Practitioner

## 2022-10-05 NOTE — Progress Notes (Deleted)
   [name] [birth year] [id number]   History:  47 y.o. G2P0002 presents for annual exam. S/P 2009 LAVH with sling for endometriosis. Vaginal pap 05/2019 normal cytology + HR HPV, benign biopsies 07/2019. H/O HSV and genital warts. T2DM, HLD managed by PCP. Smoker.   Gynecologic History No LMP recorded. Patient has had a hysterectomy.   Contraception/Family planning: status post hysterectomy Sexually active: ***  Health Maintenance Last Pap: 05/30/2019. Results were: Normal cytology + HR HPV Last mammogram: 04/08/2020. Results were: Normal Last colonoscopy: 2009 Last Dexa: Not indicated  Past medical history, past surgical history, family history and social history were all reviewed and documented in the EPIC chart.  ROS:  A ROS was performed and pertinent positives and negatives are included.  Exam:  There were no vitals filed for this visit. There is no height or weight on file to calculate BMI.  General appearance:  Normal Thyroid:  Symmetrical, normal in size, without palpable masses or nodularity. Respiratory  Auscultation:  Clear without wheezing or rhonchi Cardiovascular  Auscultation:  Regular rate, without rubs, murmurs or gallops  Edema/varicosities:  Not grossly evident Abdominal  Soft,nontender, without masses, guarding or rebound.  Liver/spleen:  No organomegaly noted  Hernia:  None appreciated  Skin  Inspection:  Grossly normal Breasts: Examined lying and sitting.   Right: Without masses, retractions, nipple discharge or axillary adenopathy.   Left: Without masses, retractions, nipple discharge or axillary adenopathy. Genitourinary   Inguinal/mons:  Normal without inguinal adenopathy  External genitalia:  Normal appearing vulva with no masses, tenderness, or lesions  BUS/Urethra/Skene's glands:  Normal  Vagina:  Normal appearing with normal color and discharge, no lesions  Cervix:  Normal appearing without discharge or lesions  Uterus:  Normal in  size, shape and contour.  Midline and mobile, nontender  Adnexa/parametria:     Rt: Normal in size, without masses or tenderness.   Lt: Normal in size, without masses or tenderness.  Anus and perineum: Normal  Digital rectal exam: Deferred  Patient informed chaperone available to be present for breast and pelvic exam. Patient has requested no chaperone to be present. Patient has been advised what will be completed during breast and pelvic exam.   Assessment/Plan:  47 y.o. G2P0002 for annual exam.    Return in 1 year for annual.   Tamela Gammon DNP, 1:34 PM 10/05/2022

## 2022-10-09 ENCOUNTER — Telehealth: Payer: Self-pay | Admitting: Endocrinology

## 2022-10-09 DIAGNOSIS — E1142 Type 2 diabetes mellitus with diabetic polyneuropathy: Secondary | ICD-10-CM

## 2022-10-09 MED ORDER — DEXCOM G6 SENSOR MISC: 1.0000 | 0 refills | Status: DC

## 2022-10-09 MED ORDER — DEXCOM G6 TRANSMITTER MISC: 0 refills | Status: DC

## 2022-10-09 NOTE — Telephone Encounter (Signed)
MEDICATION: 1)  Dexcom G6 Sensor Continuous Blood Gluc Sensor (DEXCOM G6 SENSOR) MISC  2) Dexcom G6 Transmitter Continuous Blood Gluc Transmit (DEXCOM G6 TRANSMITTER) MISC  PHARMACY:    [id number] - [name], [place] - [place] (Ph: [phone])    HAS THE PATIENT CONTACTED THEIR PHARMACY?  Yes  IS THIS A 90 DAY SUPPLY : Yes  IS PATIENT OUT OF MEDICATION: Yes  IF NOT; HOW MUCH IS LEFT:   LAST APPOINTMENT DATE: @10 /11/2021  NEXT APPOINTMENT DATE:@5 /29/2024  DO WE HAVE YOUR PERMISSION TO LEAVE A DETAILED MESSAGE?:  Yes  OTHER COMMENTS:    **Let patient know to contact pharmacy at the end of the day to make sure medication is ready. **  ** Please notify patient to allow 48-72 hours to process**  **Encourage patient to contact the pharmacy for refills or they can request refills through [facility]**

## 2022-10-09 NOTE — Telephone Encounter (Signed)
Rx sent 

## 2022-10-13 ENCOUNTER — Inpatient Hospital Stay
Admission: RE | Admit: 2022-10-13 | Discharge: 2022-10-13 | Disposition: A | Discharge status: Home / Self Care | Payer: Self-pay | Source: Ambulatory Visit | Attending: Orthopedic Surgery | Admitting: Orthopedic Surgery

## 2022-10-13 ENCOUNTER — Other Ambulatory Visit: Payer: Self-pay

## 2022-10-13 DIAGNOSIS — Z049 Encounter for examination and observation for unspecified reason: Secondary | ICD-10-CM

## 2022-10-16 ENCOUNTER — Telehealth: Payer: Self-pay | Admitting: Pharmacy Technician

## 2022-10-16 ENCOUNTER — Other Ambulatory Visit (HOSPITAL_COMMUNITY): Payer: Self-pay

## 2022-10-16 NOTE — Telephone Encounter (Signed)
Pharmacy Patient Advocate Encounter   Received notification from Pt calls msgs/CMA that prior authorization for [facility] Sensors & transmitter is required/requested.   PA submitted on 10/16/22 to (ins) [place] [employer] via [employer] or (Medicaid) confirmation # [id number] W (sensors), [id number] W (transmitter) Status is pending

## 2022-10-16 NOTE — Telephone Encounter (Signed)
PA's requested, however, since she hasn't had an office visit in the last 3 months, she will likely be denied.

## 2022-10-18 ENCOUNTER — Telehealth: Payer: Self-pay

## 2022-10-18 ENCOUNTER — Ambulatory Visit: Payer: Medicaid Other | Admitting: Family Medicine

## 2022-10-18 MED ORDER — INSULIN ASPART 100 UNIT/ML IJ SOLN: INTRAMUSCULAR | 1 refills | Status: DC

## 2022-10-18 NOTE — Telephone Encounter (Signed)
Patient requesting a refill but I don't see any insulin on her medication list. Please advise what to fill.

## 2022-10-20 NOTE — Telephone Encounter (Signed)
Pt contacted and advised without recent clinical notes her insurance will not cover any equipment rx. Pt advised she has been seen by her PCP recently and will contact her office to have them submit a request on her behalf.

## 2022-10-20 NOTE — Telephone Encounter (Signed)
Pharmacy Patient Advocate Encounter  Received notification from [facility] [employer] that the request for prior authorization for Dexcom has been denied due.    Once she has a visit, we can re-request.

## 2022-11-01 NOTE — Progress Notes (Unsigned)
Referring Physician:  Rulon Abide, NP [address] [place],  [place] [address]  Primary Physician:  Dana Allan, MD  History of Present Illness: 11/02/2022 [name] has a history of HTN, DM, asthma/COPD, emphysema, GERD, goiter, DM peripheral neuropathy, chronic pain syndrome.   She has seen [facility] in 2019 (Dr. 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 No evidence of lumbar radiculopathy. Referral to neurology recommended and lumbar surgery was not recommended   She has chronic constant LBP with bilateral thigh (entire leg) pain to her knees x 5-6 years. LBP > leg pain. She has numbness and tingling in posterior calves to her feet. Pain is worse with bending and prolonged standing. She notes weakness in both legs. She has OA in both knees and they told her to get back fixed prior to TKAs.   Bowel/Bladder Dysfunction: none  She smokes 1 ppd x 20 years.   Conservative measures:  Physical therapy: has not participated in Multimodal medical therapy including regular antiinflammatories: voltaren, neurontin, ibuprofen, mobic, ultram  Injections: Unable to have epidural steroid injections due to elevation of blood sugars post injection  Past Surgery: denies spine surgery  [name] has no symptoms of cervical myelopathy. She has intermittent dizziness.   The symptoms are causing a significant impact on the patient's life.   Review of Systems:  A 10 point review of systems is negative, except for the pertinent positives and negatives detailed in the HPI.  Past Medical History: Past Medical History:  Diagnosis Date   Absence of bladder continence 04/01/2010   Overview:   Urinary Loss Of Control   Acid reflux 04/01/2010   Overview:    Acute gastroenteritis 03/14/2022   Anxiety state, unspecified 03/26/2009   Arthralgia 08/14/2018   ASTHMA  02/02/2007   Asthma    Chest wall pain 08/02/2018   DEPRESSION 02/16/2009   DIABETES MELLITUS, TYPE I 02/02/2007   Dizziness 09/18/2018   Eczema 08/02/2018   Elevated cholesterol 06/02/2022   Encounter to establish care 06/02/2022   Endometriosis    Essential hypertension    GERD 12/21/2008   Herpes    at 18   Hyperlipidemia    Hypertension 03/03/2020   Lesion of vocal cord    Mild intermittent asthma without complication 08/12/2018   Osteoarthritis    Peripheral polyneuropathy 11/29/2017   Primary osteoarthritis of both feet 11/30/2019   Primary osteoarthritis of both hands 11/30/2019   Bilateral mild osteoarthritis.   Retinal hemorrhage of right eye 11/04/2019   Severe nonproliferative diabetic retinopathy of right eye, without macular edema, associated with type 1 diabetes mellitus 11/04/2019   Shortness of breath 09/25/2007   Uncontrolled type 1 diabetes mellitus with hyperglycemia, with long-term current use of insulin 03/14/2022          Uncontrolled type 2 diabetes mellitus with hyperglycemia, with long-term current use of insulin 03/14/2022   Vitreous hemorrhage of right eye 08/16/2021   Vocal fold leukoplakia     Past Surgical History: Past Surgical History:  Procedure Laterality Date   ABDOMINAL HYSTERECTOMY  2009   BSO   DIRECT LARYNGOSCOPY N/A 08/17/2017   Procedure: DIRECT LARYNGOSCOPY WITH OPERATING TELESCOPE WITH BIOPSY;  Surgeon: Graylin Shiver, MD;  Location: [facility] OR;  Service: ENT;  Laterality: N/A;   NASOPHARYNGEAL BIOPSY Right 08/17/2017   Procedure: NASOPHARYNGEAL BIOPSY RIGHT NASAL LESION;  Surgeon: Graylin Shiver, MD;  Location:  [facility] OR;  Service: ENT;  Laterality: Right;    Allergies: Allergies as of 11/02/2022   (No Known Allergies)    Medications: Outpatient Encounter Medications as of 11/02/2022  Medication Sig   clonazePAM (KLONOPIN) 0.5 MG tablet Take 0.25 mg by mouth 2 (two) times daily as needed.   ALPRAZolam (XANAX) 0.5 MG tablet  Take 0.5 mg by mouth daily as needed.   amLODipine (NORVASC) 10 MG tablet Take 1 tablet (10 mg total) by mouth daily.   atorvastatin (LIPITOR) 10 MG tablet Take 1 tablet (10 mg total) by mouth at bedtime.   Blood Glucose Monitoring Suppl (ACCU-CHEK GUIDE ME) w/Device KIT Use to check blood sugar daily   cetirizine (ZYRTEC) 10 MG tablet Take 1 tablet (10 mg total) by mouth daily.   Cholecalciferol (VITAMIN D3) 50 MCG (2000 UT) capsule Take 2,000 Units by mouth daily.   Continuous Blood Gluc Sensor (DEXCOM G6 SENSOR) MISC 1 each by Does not apply route See admin instructions. Change sensor every 10 days; E10.9   Continuous Blood Gluc Transmit (DEXCOM G6 TRANSMITTER) MISC Use as instructed to check blood sugars, change every 3 months.   diazepam (VALIUM) 10 MG tablet 1 Po 1 hour prior to procedure   diclofenac (VOLTAREN) 75 MG EC tablet    fluticasone (FLONASE) 50 MCG/ACT nasal spray Place 2 sprays into both nostrils daily.   gabapentin (NEURONTIN) 300 MG capsule Take 1 capsule (300 mg total) by mouth 3 (three) times daily. As needed   glucagon 1 MG injection Inject 1 mg into the muscle once as needed.   Ibuprofen (ADVIL PO) Take 2-3 tablets by mouth every 6 (six) hours as needed.   insulin aspart (NOVOLOG) 100 UNIT/ML injection INJECT 40 UNITS DAILY PER INSULIN PUMP (MAX DOSAGE)   Insulin Pen Needle (PEN NEEDLES) 32G X 5 MM MISC Use as instructed   ipratropium (ATROVENT HFA) 17 MCG/ACT inhaler Inhale 2 puffs into the lungs every 4 (four) hours as needed for wheezing.   meloxicam (MOBIC) 15 MG tablet Take 1 tablet every day by oral route with meal(s).   Multiple Vitamin (MULTIVITAMIN WITH MINERALS) TABS tablet Take 1 tablet by mouth daily.   omeprazole (PRILOSEC) 40 MG capsule TAKE 1 CAPSULE BY MOUTH TWICE A DAY   ondansetron (ZOFRAN-ODT) 4 MG disintegrating tablet Take 1 tablet (4 mg total) by mouth every 8 (eight) hours as needed for nausea or vomiting.   QUEtiapine (SEROQUEL XR) 50 MG TB24 24 hr  tablet Take by mouth.   rOPINIRole (REQUIP) 1 MG tablet Take 1 tablet (1 mg total) by mouth at bedtime. TAKE 1 TABLET BY MOUTH AT BEDTIME   sertraline (ZOLOFT) 25 MG tablet Take 50 mg by mouth daily.   traMADol (ULTRAM) 50 MG tablet Take 50 mg by mouth every 6 (six) hours as needed.   traZODone (DESYREL) 100 MG tablet PLEASE SEE ATTACHED FOR DETAILED DIRECTIONS   VENTOLIN HFA 108 (90 Base) MCG/ACT inhaler    No facility-administered encounter medications on file as of 11/02/2022.    Social History: Social History   Tobacco Use   Smoking status: Every Day    Packs/day: 1.50    Years: 31.00    Additional pack years: 0.00    Total pack years: 46.50    Types: Cigarettes   Smokeless tobacco: Never   Tobacco comments:    11/03/2021 -  states she smokes a pack and a half a day  Vaping Use   Vaping Use: Former   Quit  date: 02/23/2017   Devices: only used tobacco   Substance Use Topics   Alcohol use: Not Currently    Alcohol/week: 0.0 standard drinks of alcohol   Drug use: No    Family Medical History: Family History  Problem Relation Age of Onset   COPD Mother    Arthritis Mother    Diabetes Mother    COPD Father    Cancer Father    Arthritis Father    Diabetes Father    Diabetes type I Brother    Diabetes Maternal Aunt    Emphysema Maternal Uncle    Diabetes Maternal Grandfather    Colon cancer Neg Hx    Kidney disease Neg Hx    Liver disease Neg Hx    Esophageal cancer Neg Hx    Rectal cancer Neg Hx    Stomach cancer Neg Hx     Physical Examination: There were no vitals filed for this visit.  General: Patient is well developed, well nourished, calm, collected, and in no apparent distress. Attention to examination is appropriate.  Respiratory: Patient is breathing without any difficulty.   NEUROLOGICAL:     Awake, alert, oriented to person, place, and time.  Speech is clear and fluent. Fund of knowledge is appropriate.   Cranial Nerves: Pupils equal round and  reactive to light.  Facial tone is symmetric.    Limited ROM of lumbar spine with pain on flexion Mild lower posterior lumbar tenderness.   No abnormal lesions on exposed skin.   Strength: Side Biceps Triceps Deltoid Interossei Grip Wrist Ext. Wrist Flex.  R 5 5 5 5 5 5 5   L 5 5 5 5 5 5 5    Side Iliopsoas Quads Hamstring PF DF EHL  R 5 5 5 5 5 5   L 5 5 5 5 5 5    Reflexes are 2+ and symmetric at the biceps, triceps, brachioradialis, and achilles. She requested I did not test DTR at patella bilaterally due to knee pain.   Hoffman's is absent.  Clonus is not present.   Bilateral upper and lower extremity sensation is intact to light touch, but diminished in bilateral legs from knees down.   She has a slight limping gait.   Medical Decision Making  Imaging: Lumbar xrays dated 08/23/22:  Slip at L5-S1 with moderate/severe DDD.   No radiology report available for above xrays.    Lumbar MRI dated 08/29/22:  CONCLUSION:  Severe disc space narrowing at L5-S1 with Modic type 1 endplate edema. Abutment of the descending right S1 nerve root due to pseudodisc of listhesis and disc bulge. Moderate left and moderate to severe right foraminal stenosis contacting the exiting bilateral L5 nerve roots and slight compression on the right.  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  Heterogeneous T1 marrow signal, scattered Schmorl's nodes and minimal endplate depressions. Imaging appearance is suspicious for osteopenia, unexpected in a patient this age. Correlate with DEXA scan and serum endocrine workup. Alternative etiologies of chronic smoking history of anemia/blood dyscrasia less likely.   Above report from [facility] scanned into chart.   I have personally reviewed the images and agree with the above interpretation.  Assessment and Plan: Ms. Kasson is a pleasant 47 y.o. female has chronic constant LBP with bilateral thigh (entire leg) pain to her knees x 5-6 years. LBP  > leg pain. She has numbness and tingling in posterior calves to her feet. Pain is worse with bending and prolonged standing.  She has known slip  at L5-S1 with pars at L5 along with severe DDD and bilateral moderate/severe foraminal stenosis. LBP likely due to L5-S1. Thigh pain may be lumbar medidated. Numbness/tingling from knees down is likely due to her neuropathy.   Treatment options discussed with patient and following plan made:   - Order for physical therapy for lumbar spine to [facility]. Patient to call to schedule appointment.  - Unable to have ESIs as they elevate her blood sugars.  - Continue current medications including prn ultram (from ortho). Reviewed dosing and side effects.  - Continue neurontin from other providers. She will ask about possibly increasing it.  - Discussed she may need to get updated EMG, DEXA scan, and lumbar flexion/extension xrays prior to any surgical consideration.  - Smoking cessation discussed and encouraged.  - HgBA1c would need to be less than 7.5 prior to any surgery. It was 9.4 on 04/17/22 (this was right after her husband passed away).  - She was given 5 day supply of ultram on 4/15- I can send a one time refill of this to be filled on Saturday 4/20.  - Follow up with me in 6 weeks. Will let me know if she is discharged from PT.   I spent a total of 45 minutes in face-to-face and non-face-to-face activities related to this patient's care today including review of outside records, review of imaging, review of symptoms, physical exam, discussion of differential diagnosis, discussion of treatment options, and documentation.   Thank you for involving me in the care of this patient.   Drake Leach PA-C Dept. of Neurosurgery

## 2022-11-02 ENCOUNTER — Ambulatory Visit (INDEPENDENT_AMBULATORY_CARE_PROVIDER_SITE_OTHER): Payer: Medicaid Other | Admitting: Orthopedic Surgery

## 2022-11-02 ENCOUNTER — Telehealth: Payer: Self-pay | Admitting: Family Medicine

## 2022-11-02 ENCOUNTER — Encounter: Payer: Self-pay | Admitting: Orthopedic Surgery

## 2022-11-02 VITALS — BP 136/72 | Ht 64.0 in | Wt 126.8 lb

## 2022-11-02 DIAGNOSIS — G629 Polyneuropathy, unspecified: Secondary | ICD-10-CM | POA: Diagnosis not present

## 2022-11-02 DIAGNOSIS — M5416 Radiculopathy, lumbar region: Secondary | ICD-10-CM

## 2022-11-02 DIAGNOSIS — M4316 Spondylolisthesis, lumbar region: Secondary | ICD-10-CM | POA: Diagnosis not present

## 2022-11-02 DIAGNOSIS — M5136 Other intervertebral disc degeneration, lumbar region: Secondary | ICD-10-CM | POA: Diagnosis not present

## 2022-11-02 NOTE — Telephone Encounter (Signed)
New message    Received paperwork back - CM medical necessity was not sign.   Form refax back to the office   Fax # [phone]

## 2022-11-02 NOTE — Patient Instructions (Signed)
It was so nice to see you today. Thank you so much for coming in.    You have a slip at L5-S1 with degeneration of the disc- this is likely causing your back pain and may be causing some of the pain in your thighs.   Some of the thigh pain and the numbness/tingling in your lower legs is likely from your neuropathy.   I sent physical therapy orders to [facility] (in [place]). You can call them at [phone] if you don't hear from them to schedule your visit.   I can send a one time only script for tramadol to help with severe pain. Will send to be filled on Saturday. Take only as needed for severe pain. This can make you sleepy and/or constipated.   Work on quitting smoking as we discussed.   Work on getting your blood sugars under good control. Your HgbA1c would need to be less than 7.5 to consider surgery.   I will see you back in 6 weeks. Please do not hesitate to call if you have any questions or concerns. You can also message me in MyChart.   If you have not heard back about PT in the next week, please call the office so we can help you get it scheduled.   Drake Leach PA-C [phone]

## 2022-11-03 ENCOUNTER — Telehealth: Payer: Self-pay | Admitting: Orthopedic Surgery

## 2022-11-03 DIAGNOSIS — M4316 Spondylolisthesis, lumbar region: Secondary | ICD-10-CM

## 2022-11-03 DIAGNOSIS — M5136 Other intervertebral disc degeneration, lumbar region: Secondary | ICD-10-CM

## 2022-11-03 DIAGNOSIS — G629 Polyneuropathy, unspecified: Secondary | ICD-10-CM

## 2022-11-03 DIAGNOSIS — M5416 Radiculopathy, lumbar region: Secondary | ICD-10-CM

## 2022-11-03 MED ORDER — HYDROCODONE-ACETAMINOPHEN 5-325 MG PO TABS: 1.0000 | ORAL_TABLET | Freq: Three times a day (TID) | ORAL | 0 refills | Status: DC | PRN

## 2022-11-03 NOTE — Telephone Encounter (Signed)
See office note from yesterday.   She is on trazodone, so do not recommend ultram.   Will do one time script of norco #15 to take for severe pain only.   PMP reviewed and is appropriate.   Norco sent to pharmacy to be filled on Saturday 11/04/22.   Message sent to patient.

## 2022-11-06 NOTE — Telephone Encounter (Signed)
Printed off and placed for provider to sign.

## 2022-11-08 NOTE — Telephone Encounter (Signed)
Yes it was faxed and got confirmation that it was received

## 2022-12-06 ENCOUNTER — Telehealth: Payer: Self-pay | Admitting: Orthopedic Surgery

## 2022-12-06 DIAGNOSIS — M4316 Spondylolisthesis, lumbar region: Secondary | ICD-10-CM

## 2022-12-06 DIAGNOSIS — M5136 Other intervertebral disc degeneration, lumbar region: Secondary | ICD-10-CM

## 2022-12-06 DIAGNOSIS — M51369 Other intervertebral disc degeneration, lumbar region without mention of lumbar back pain or lower extremity pain: Secondary | ICD-10-CM

## 2022-12-06 DIAGNOSIS — M5416 Radiculopathy, lumbar region: Secondary | ICD-10-CM

## 2022-12-06 MED ORDER — METHOCARBAMOL 500 MG PO TABS
500.0000 mg | ORAL_TABLET | Freq: Three times a day (TID) | ORAL | 0 refills | Status: DC | PRN
Start: 2022-12-06 — End: 2023-08-16

## 2022-12-06 NOTE — Telephone Encounter (Signed)
Please let her know that hydrocodone was a one time only prescription and I cannot refill. I can do referral pain management ([facility]) if she is interested.   Has she tried a muscle relaxer? If not, I can call in robaxin for her.   If [facility] is not answering, is another [facility] location close to her?   I agree with pushing back follow up with  me if she has not done PT yet.   Let me know about robaxin and if I need to do another PT order.

## 2022-12-06 NOTE — Addendum Note (Signed)
Addended byDrake Leach on: 12/06/2022 05:03 PM   Modules accepted: Orders

## 2022-12-06 NOTE — Telephone Encounter (Signed)
Robaxin sent to pharmacy.   Let me know if I need to put in new PT orders.

## 2022-12-06 NOTE — Telephone Encounter (Signed)
She has tried calling [facility] but no one answers and the time they answer it's just the recording and she leaves a message. No one has called her back yet. She is still in alot of pain. Can she get a refill hydrocodone or if you can call anything in for the pain to [facility]. She has a telephone visit with Kennyth Arnold on 5/30 do you want her to reschedule?

## 2022-12-06 NOTE — Telephone Encounter (Signed)
I spoke with the patient and she verbalized understanding about the Norco prescription. She would be interested in a prescription of robaxin, please send this in.  For physical therapy she would be fine going to [place] since she has medicaid. She asked if I could call her back tomorrow to reschedule her telephone visit.   [name] can you please call her and get her telephone appt rescheduled and send her PT referral to [facility] (someone that takes medicaid)

## 2022-12-07 NOTE — Telephone Encounter (Signed)
Left message for patient that the referral has been sent to [facility] in [place].

## 2022-12-08 ENCOUNTER — Other Ambulatory Visit: Payer: Medicaid Other

## 2022-12-13 ENCOUNTER — Encounter: Payer: Medicaid Other | Admitting: Endocrinology

## 2022-12-13 NOTE — Progress Notes (Unsigned)
This encounter was created in error - please disregard.

## 2022-12-13 NOTE — Progress Notes (Unsigned)
Telephone Visit- Progress Note: Referring Physician:  Dana Allan, MD [address] [place],  [place] [address]  Primary Physician:  Dana Allan, MD  This visit was performed via telephone.  Patient location: home Provider location: office  I spent a total of *** minutes non-face-to-face activities for this visit on the date of this encounter including review of current clinical condition and response to treatment.    Patient has given verbal consent to this telephone visits and we reviewed the limitations of a telephone visit. Patient wishes to proceed.    Chief Complaint:  recheck  History of Present Illness: [name] is a 47 y.o. female has a history of  HTN, DM, asthma/COPD, emphysema, GERD, goiter, DM peripheral neuropathy, chronic pain syndrome.    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 LBP likely due to L5-S1. Thigh pain may be lumbar medidated. Numbness/tingling from knees down is likely due to her neuropathy per EMG.    PT was recommended and she was sent to [facility] on 12/06/22. She was given robaxin and referral to pain management was discussed.   Phone visit for follow up.       Exam: No exam done as this was a telephone encounter.     Imaging: ***  I have personally reviewed the images and agree with the above interpretation.  Assessment and Plan: [name] is a pleasant 47 y.o. female with chronic constant LBP with bilateral thigh (entire leg) pain to her knees x 5-6 years. LBP > leg pain. She has numbness and tingling in posterior calves to her feet. Pain is worse with bending and prolonged standing.   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 LBP likely due to L5-S1. Thigh pain may be lumbar medidated. Numbness/tingling from knees down is likely due to her neuropathy.    Treatment options  discussed with patient and following plan made:    - Order for physical therapy for lumbar spine to [facility]. Patient to call to schedule appointment.  - Unable to have ESIs as they elevate her blood sugars.  - Continue current medications including prn ultram (from ortho). Reviewed dosing and side effects.  - Continue neurontin from other providers. She will ask about possibly increasing it.  - Discussed she may need to get updated EMG, DEXA scan, and lumbar flexion/extension xrays prior to any surgical consideration.  - Smoking cessation discussed and encouraged.  - HgBA1c would need to be less than 7.5 prior to any surgery. It was 9.4 on 04/17/22 (this was right after her husband passed away).  - She was given 5 day supply of ultram on 4/15- I can send a one time refill of this to be filled on Saturday 4/20.  - Follow up with me in 6 weeks. Will let me know if she is discharged from PT.     Treatment options discussed with patient and following plan made:   - Order for physical therapy for *** spine ***. Patient to call to schedule appointment. *** - Continue current medications including ***. Reviewed dosing and side effects.  - Prescription for ***. Reviewed dosing and side effects. Take with food.  - Prescription for *** to take prn muscle spasms. Reviewed dosing and side effects. Discussed this can cause drowsiness.  - MRI of *** to further evaluate *** radiculopathy. No improvement time or medications (***).  -  Referral to PMR at [facility] to discuss possible *** injections.  - Will schedule phone visit to review MRI results once I get them back.   Drake Leach PA-C Neurosurgery

## 2022-12-14 ENCOUNTER — Encounter: Payer: Self-pay | Admitting: Orthopedic Surgery

## 2022-12-14 ENCOUNTER — Ambulatory Visit (INDEPENDENT_AMBULATORY_CARE_PROVIDER_SITE_OTHER): Payer: Medicaid Other | Admitting: Orthopedic Surgery

## 2022-12-14 ENCOUNTER — Encounter: Payer: Self-pay | Admitting: Endocrinology

## 2022-12-14 ENCOUNTER — Ambulatory Visit: Payer: Medicaid Other | Admitting: Obstetrics & Gynecology

## 2022-12-14 DIAGNOSIS — G629 Polyneuropathy, unspecified: Secondary | ICD-10-CM

## 2022-12-14 DIAGNOSIS — M5136 Other intervertebral disc degeneration, lumbar region: Secondary | ICD-10-CM

## 2022-12-14 DIAGNOSIS — M5416 Radiculopathy, lumbar region: Secondary | ICD-10-CM

## 2022-12-14 DIAGNOSIS — M5116 Intervertebral disc disorders with radiculopathy, lumbar region: Secondary | ICD-10-CM

## 2022-12-14 DIAGNOSIS — M4316 Spondylolisthesis, lumbar region: Secondary | ICD-10-CM

## 2022-12-14 NOTE — Telephone Encounter (Signed)
Please make her phone visit with me in 6-8 weeks.   Thanks.

## 2022-12-20 ENCOUNTER — Telehealth: Payer: Self-pay | Admitting: Endocrinology

## 2022-12-20 NOTE — Telephone Encounter (Signed)
Patient dismissed from Dr. Remus Blake medical care because of not following up as needed for diabetes care. 12/14/22

## 2023-01-01 ENCOUNTER — Ambulatory Visit: Payer: Medicaid Other

## 2023-01-04 ENCOUNTER — Other Ambulatory Visit: Payer: Self-pay | Admitting: Endocrinology

## 2023-01-04 ENCOUNTER — Other Ambulatory Visit: Payer: Medicaid Other

## 2023-01-04 DIAGNOSIS — E1065 Type 1 diabetes mellitus with hyperglycemia: Secondary | ICD-10-CM

## 2023-01-08 ENCOUNTER — Ambulatory Visit: Payer: Medicaid Other

## 2023-01-08 ENCOUNTER — Other Ambulatory Visit (INDEPENDENT_AMBULATORY_CARE_PROVIDER_SITE_OTHER): Payer: Medicaid Other

## 2023-01-08 DIAGNOSIS — E1065 Type 1 diabetes mellitus with hyperglycemia: Secondary | ICD-10-CM | POA: Diagnosis not present

## 2023-01-09 ENCOUNTER — Ambulatory Visit (INDEPENDENT_AMBULATORY_CARE_PROVIDER_SITE_OTHER): Payer: Medicaid Other | Admitting: Endocrinology

## 2023-01-09 ENCOUNTER — Telehealth: Payer: Self-pay | Admitting: Endocrinology

## 2023-01-09 VITALS — BP 120/60 | HR 98 | Ht 64.0 in | Wt 122.2 lb

## 2023-01-09 DIAGNOSIS — E1065 Type 1 diabetes mellitus with hyperglycemia: Secondary | ICD-10-CM | POA: Diagnosis not present

## 2023-01-09 LAB — HEMOGLOBIN A1C: Hgb A1c MFr Bld: 9.7 % — ABNORMAL HIGH (ref 4.6–6.5)

## 2023-01-09 LAB — GLUCOSE, RANDOM: Glucose, Bld: 154 mg/dL — ABNORMAL HIGH (ref 70–99)

## 2023-01-09 LAB — POCT GLYCOSYLATED HEMOGLOBIN (HGB A1C): Hemoglobin A1C: 9.4 % — AB (ref 4.0–5.6)

## 2023-01-09 LAB — MICROALBUMIN / CREATININE URINE RATIO
Creatinine,U: 6.7 mg/dL
Microalb Creat Ratio: 10.5 mg/g (ref 0.0–30.0)
Microalb, Ur: <0.7 mg/dL (ref 0.0–1.9)

## 2023-01-09 NOTE — Patient Instructions (Addendum)
Enter carbs at meals  NEW Settings:  12 AM-10 AM = 0.35, 10 AM-6 PM = 0.4 5 and 6 PM = 0.35 Correction factor I: 80  carb ratio 1:10

## 2023-01-09 NOTE — Telephone Encounter (Signed)
Please reauthorize Dexcom sensors from [employer]

## 2023-01-09 NOTE — Progress Notes (Signed)
Patient ID: [name], female   DOB: 12-22-1975, 47 y.o.   MRN: [id number]    Chief complaint : Follow up of Type 1 Diabetes  History of Present Illness:          Date of diagnosis:  1989    Prior history:   She has been on insulin pump for several years including T-slim pump since 2017 A1c range in the last few years has been 7.7-9.3   INSULIN pump settings: Basal rate 0.1 all 24 Boluses with carbohydrate set up 1:1 ratio, correction 1: 600, target 130 Duration of insulin action 5 hours, maximum bolus 10 units  Current insulin: NovoLog  Recent history:  Her A1c is 9.4, unchanged  She did not change her pump settings as recommended on the last visit She has had numerous cancellations of her visits and is finally coming back for follow-up Because of her not following up every 6 months her sensors were not approved by insurance She did not bring her Accu-Chek monitor for review today She claims that her blood sugars are fairly good except may be 180 at bedtime but this is not explained by her A1c  She has been on the T-slim insulin pump since 2017 She thinks that she is taking 3 to 4 units of bolus to cover her meals; however review of her pump download indicates that she is only taking 1 to 2 units of boluses and without counting carbohydrates Average bolus per day is about 4 units Mostly eating 2 meals a day and usually just eating peanut butter crackers at lunchtime And not actually using carbohydrate counting and also unclear whether she is taking any correction boluses Since she has not had the sensor for several weeks unable to get an idea of her actual blood sugar readings or data No hypoglycemia recently although she states that occasionally she may wake up feeling shaking during the night with blood sugars in the 80s   PREVIOUS glucose patterns from sensor download on the pump: OVERNIGHT blood sugars are significantly variable but in general averaging just over  200 at all times with fasting glucose about 190 and no hypoglycemia Postprandial glucoses appear to be mostly running higher especially after lunch and periodically after dinner but difficult to assess mealtimes because of lack of carbohydrate entry food boluses   Previous CGM data:   CGM use % of time   2-week average/SD 228  Time in range    10    %  % Time Above 180 90  % Time above 250   % Time Below 80 0      PRE-MEAL  overnight  mornings  afternoon  evening Overall  Glucose range:     116-400  Averages: 214 218 254 228     Retinal exams, Most recent:  3/24  Diabetes labs:  Lab Results  Component Value Date   HGBA1C 9.4 (H) 04/17/2022   HGBA1C 8.6 (A) 11/09/2021   HGBA1C 8.8 (A) 09/07/2021   Lab Results  Component Value Date   MICROALBUR <0.7 12/31/2015   CREATININE 0.74 04/17/2022    No visits with results within 1 Week(s) from this visit.  Latest known visit with results is:  Abstract on 09/20/2022  Component Date Value Ref Range Status   [facility] Diabetic Eye Exam 09/19/2022 Retinopathy (A)  No Retinopathy Final    Allergies as of 01/09/2023   No Known Allergies      Medication List  Accurate as of January 09, 2023 10:30 AM. If you have any questions, ask your nurse or doctor.          Accu-Chek Guide Me w/Device Kit Use to check blood sugar daily   ADVIL PO Take 2-3 tablets by mouth every 6 (six) hours as needed.   ALPRAZolam 0.5 MG tablet Commonly known as: XANAX Take 0.5 mg by mouth 2 (two) times daily.   amLODipine 10 MG tablet Commonly known as: NORVASC Take 1 tablet (10 mg total) by mouth daily.   atorvastatin 10 MG tablet Commonly known as: LIPITOR Take 1 tablet (10 mg total) by mouth at bedtime.   Atrovent HFA 17 MCG/ACT inhaler Generic drug: ipratropium Inhale 2 puffs into the lungs every 4 (four) hours as needed for wheezing.   cetirizine 10 MG tablet Commonly known as: ZYRTEC Take 1 tablet (10 mg total) by mouth daily.    clonazePAM 0.5 MG tablet Commonly known as: KLONOPIN Take 0.25 mg by mouth 2 (two) times daily as needed.   Dexcom G6 Sensor Misc 1 each by Does not apply route See admin instructions. Change sensor every 10 days; E10.9   Dexcom G6 Transmitter Misc Use as instructed to check blood sugars, change every 3 months.   diclofenac 75 MG EC tablet Commonly known as: VOLTAREN   fluticasone 50 MCG/ACT nasal spray Commonly known as: FLONASE Place 2 sprays into both nostrils daily.   gabapentin 300 MG capsule Commonly known as: Neurontin Take 1 capsule (300 mg total) by mouth 3 (three) times daily. As needed   glucagon 1 MG injection Inject 1 mg into the muscle once as needed.   HYDROcodone-acetaminophen 5-325 MG tablet Commonly known as: Norco Take 1 tablet by mouth every 8 (eight) hours as needed for moderate pain.   insulin aspart 100 UNIT/ML injection Commonly known as: NovoLOG INJECT 40 UNITS DAILY PER INSULIN PUMP (MAX DOSAGE)   methocarbamol 500 MG tablet Commonly known as: ROBAXIN Take 1 tablet (500 mg total) by mouth every 8 (eight) hours as needed for muscle spasms. Be careful, this can make you sleepy.   multivitamin with minerals Tabs tablet Take 1 tablet by mouth daily.   omeprazole 40 MG capsule Commonly known as: PRILOSEC TAKE 1 CAPSULE BY MOUTH TWICE A DAY   ondansetron 4 MG disintegrating tablet Commonly known as: ZOFRAN-ODT Take 1 tablet (4 mg total) by mouth every 8 (eight) hours as needed for nausea or vomiting.   Pen Needles 32G X 5 MM Misc Use as instructed   Rexulti 1 MG Tabs tablet Generic drug: brexpiprazole Take 1 mg by mouth daily.   rOPINIRole 1 MG tablet Commonly known as: REQUIP Take 1 tablet (1 mg total) by mouth at bedtime. TAKE 1 TABLET BY MOUTH AT BEDTIME   sertraline 25 MG tablet Commonly known as: ZOLOFT Take 50 mg by mouth daily.   traZODone 100 MG tablet Commonly known as: DESYREL PLEASE SEE ATTACHED FOR DETAILED DIRECTIONS    Ventolin HFA 108 (90 Base) MCG/ACT inhaler Generic drug: albuterol   Vitamin D3 50 MCG (2000 UT) capsule Take 2,000 Units by mouth daily.        Allergies: No Known Allergies  Past Medical History:  Diagnosis Date   Absence of bladder continence 04/01/2010   Overview:   Urinary Loss Of Control   Acid reflux 04/01/2010   Overview:    Acute gastroenteritis 03/14/2022   Anxiety state, unspecified 03/26/2009   Arthralgia 08/14/2018   ASTHMA 02/02/2007  Asthma    Chest wall pain 08/02/2018   DEPRESSION 02/16/2009   DIABETES MELLITUS, TYPE I 02/02/2007   Dizziness 09/18/2018   Eczema 08/02/2018   Elevated cholesterol 06/02/2022   Encounter to establish care 06/02/2022   Endometriosis    Essential hypertension    GERD 12/21/2008   Herpes    at 18   Hyperlipidemia    Hypertension 03/03/2020   Lesion of vocal cord    Mild intermittent asthma without complication 08/12/2018   Osteoarthritis    Peripheral polyneuropathy 11/29/2017   Primary osteoarthritis of both feet 11/30/2019   Primary osteoarthritis of both hands 11/30/2019   Bilateral mild osteoarthritis.   Retinal hemorrhage of right eye 11/04/2019   Severe nonproliferative diabetic retinopathy of right eye, without macular edema, associated with type 1 diabetes mellitus (HCC) 11/04/2019   Shortness of breath 09/25/2007   Uncontrolled type 1 diabetes mellitus with hyperglycemia, with long-term current use of insulin (HCC) 03/14/2022          Uncontrolled type 2 diabetes mellitus with hyperglycemia, with long-term current use of insulin (HCC) 03/14/2022   Vitreous hemorrhage of right eye (HCC) 08/16/2021   Vocal fold leukoplakia     Past Surgical History:  Procedure Laterality Date   ABDOMINAL HYSTERECTOMY  2009   BSO   DIRECT LARYNGOSCOPY N/A 08/17/2017   Procedure: DIRECT LARYNGOSCOPY WITH OPERATING TELESCOPE WITH BIOPSY;  Surgeon: Graylin Shiver, MD;  Location: [facility] OR;  Service: ENT;  Laterality: N/A;    NASOPHARYNGEAL BIOPSY Right 08/17/2017   Procedure: NASOPHARYNGEAL BIOPSY RIGHT NASAL LESION;  Surgeon: Graylin Shiver, MD;  Location: [facility] OR;  Service: ENT;  Laterality: Right;    Family History  Problem Relation Age of Onset   COPD Mother    Arthritis Mother    Diabetes Mother    COPD Father    Cancer Father    Arthritis Father    Diabetes Father    Diabetes type I Brother    Diabetes Maternal Aunt    Emphysema Maternal Uncle    Diabetes Maternal Grandfather    Colon cancer Neg Hx    Kidney disease Neg Hx    Liver disease Neg Hx    Esophageal cancer Neg Hx    Rectal cancer Neg Hx    Stomach cancer Neg Hx     Social History:  reports that she has been smoking cigarettes. She has a 46.50 pack-year smoking history. She has never used smokeless tobacco. She reports that she does not currently use alcohol. She reports that she does not use drugs.    Review of Systems:   Blood pressure: Controlled Not on antihypertensives  BP Readings from Last 3 Encounters:  01/09/23 120/60  11/02/22 136/72  08/17/22 128/72     Lipids: Not available  Diabetes complications: None reported, urine microalbumin not available  Last eye exam 1/23 Last foot exam 6/22  She says that she has had significant anxiety and depression, currently treated with sertraline by an online facility She says that she is out of her Xanax which was prescribed by PCP and she cannot get anybody else to prescribe it  HYPONATREMIA: This has been present for several years She also has been on sertraline Not on any hydrochlorothiazide Recently not drinking excessive amount of fluids  Physical Examination:  BP 120/60 (BP Location: Left Arm, Patient Position: Sitting, Cuff Size: Large)   Pulse 98   Ht 5\' 4"  (1.626 m)   Wt 122 lb 3.2 oz (55.4 kg)  SpO2 98%   BMI 20.98 kg/m         ASSESSMENT/PLAN   Diabetes type 1 with consistently poor control on Tandem insulin pump  A1c is again  9.4  Problems identified: Unable to review her blood sugars since she did not bring her monitor Has not used the sensor for several weeks  Will follow-up protein She has not changed her pump settings as directed and appears to be getting inadequate insulin both basal and bolus Not have appear to be getting adequate basal insulin as her blood sugars are consistently high at all times Bolusing without entering carbohydrates and not doing correction based on the pump settings since correction factor is not properly programmed Variable diet   Recommendations:  She will need to reprogram her pump as follows: 12 AM-10 AM = 0.35, 10 AM-6 PM = 0.4 5 and 6 PM = 0.35 Correction factor I: 80 instead of 1: 600 She will bolus using a carb ratio 1:10   Make sure she is putting in her carbohydrate intake for boluses before each meal Also do correction boluses additionally if blood sugars are consistently over 200 More regular follow-up Trial of G7 sensor with her pump but she will need to upgrade her pump by calling the company  If she has difficulty programmer her pump she will need to discuss with customer support Sample given  Check urine microalbumin  There are no Patient Instructions on file for this visit.                         Total visit time for evaluation and management and counseling = 30 minutes  Nene Aranas 01/09/2023, 10:30 AM

## 2023-01-10 ENCOUNTER — Telehealth: Payer: Self-pay | Admitting: Endocrinology

## 2023-01-10 ENCOUNTER — Other Ambulatory Visit (HOSPITAL_COMMUNITY): Payer: Self-pay

## 2023-01-10 NOTE — Telephone Encounter (Signed)
Patient came in to office today and picked up sample of Dexcom G7 Sensor. 

## 2023-01-11 ENCOUNTER — Telehealth: Payer: Self-pay

## 2023-01-11 NOTE — Telephone Encounter (Signed)
Patient Advocate Encounter   Received notification from pt msgs that prior authorization is required for Dexcom G6 supplies  Submitted: 01/11/23 via [facility] tracks [employer] [phone] [facility] [id number]

## 2023-01-11 NOTE — Telephone Encounter (Signed)
Both have been APPROVED   Effective 01/11/23 to 07/10/23

## 2023-01-14 ENCOUNTER — Other Ambulatory Visit: Payer: Self-pay | Admitting: Nurse Practitioner

## 2023-01-16 ENCOUNTER — Ambulatory Visit: Payer: MEDICAID | Attending: Orthopedic Surgery

## 2023-01-16 DIAGNOSIS — G629 Polyneuropathy, unspecified: Secondary | ICD-10-CM | POA: Insufficient documentation

## 2023-01-16 DIAGNOSIS — R262 Difficulty in walking, not elsewhere classified: Secondary | ICD-10-CM | POA: Insufficient documentation

## 2023-01-16 DIAGNOSIS — M5459 Other low back pain: Secondary | ICD-10-CM | POA: Insufficient documentation

## 2023-01-16 DIAGNOSIS — M6281 Muscle weakness (generalized): Secondary | ICD-10-CM | POA: Insufficient documentation

## 2023-01-16 DIAGNOSIS — M4316 Spondylolisthesis, lumbar region: Secondary | ICD-10-CM | POA: Insufficient documentation

## 2023-01-16 DIAGNOSIS — M5136 Other intervertebral disc degeneration, lumbar region: Secondary | ICD-10-CM | POA: Insufficient documentation

## 2023-01-16 DIAGNOSIS — M5416 Radiculopathy, lumbar region: Secondary | ICD-10-CM | POA: Insufficient documentation

## 2023-01-16 NOTE — Therapy (Signed)
OUTPATIENT PHYSICAL THERAPY THORACOLUMBAR EVALUATION   Patient Name: [name] MRN: [id number] DOB:May 24, 1976, 47 y.o., female Today's Date: 01/16/2023  END OF SESSION:  PT End of Session - 01/16/23 1436     Visit Number 1    Number of Visits 17    Date for PT Re-Evaluation 03/13/23    PT Start Time 1438    PT Stop Time 1520    PT Time Calculation (min) 42 min    Activity Tolerance Patient tolerated treatment well    Behavior During Therapy [facility] for tasks assessed/performed             Past Medical History:  Diagnosis Date   Absence of bladder continence 04/01/2010   Overview:   Urinary Loss Of Control   Acid reflux 04/01/2010   Overview:    Acute gastroenteritis 03/14/2022   Anxiety state, unspecified 03/26/2009   Arthralgia 08/14/2018   ASTHMA 02/02/2007   Asthma    Chest wall pain 08/02/2018   DEPRESSION 02/16/2009   DIABETES MELLITUS, TYPE I 02/02/2007   Dizziness 09/18/2018   Eczema 08/02/2018   Elevated cholesterol 06/02/2022   Encounter to establish care 06/02/2022   Endometriosis    Essential hypertension    GERD 12/21/2008   Herpes    at 18   Hyperlipidemia    Hypertension 03/03/2020   Lesion of vocal cord    Mild intermittent asthma without complication 08/12/2018   Osteoarthritis    Peripheral polyneuropathy 11/29/2017   Primary osteoarthritis of both feet 11/30/2019   Primary osteoarthritis of both hands 11/30/2019   Bilateral mild osteoarthritis.   Retinal hemorrhage of right eye 11/04/2019   Severe nonproliferative diabetic retinopathy of right eye, without macular edema, associated with type 1 diabetes mellitus (HCC) 11/04/2019   Shortness of breath 09/25/2007   Uncontrolled type 1 diabetes mellitus with hyperglycemia, with long-term current use of insulin (HCC) 03/14/2022          Uncontrolled type 2 diabetes mellitus with hyperglycemia, with long-term current use of insulin (HCC) 03/14/2022   Vitreous hemorrhage of right eye (HCC)  08/16/2021   Vocal fold leukoplakia    Past Surgical History:  Procedure Laterality Date   ABDOMINAL HYSTERECTOMY  2009   BSO   DIRECT LARYNGOSCOPY N/A 08/17/2017   Procedure: DIRECT LARYNGOSCOPY WITH OPERATING TELESCOPE WITH BIOPSY;  Surgeon: Graylin Shiver, MD;  Location: [facility] OR;  Service: ENT;  Laterality: N/A;   NASOPHARYNGEAL BIOPSY Right 08/17/2017   Procedure: NASOPHARYNGEAL BIOPSY RIGHT NASAL LESION;  Surgeon: Graylin Shiver, MD;  Location: [facility] OR;  Service: ENT;  Laterality: Right;   Patient Active Problem List   Diagnosis Date Noted   Emphysema lung (HCC) 08/19/2022   Gastroparesis due to DM (HCC) 08/19/2022   Benzodiazepine dependence (HCC) 08/19/2022   Hypertension associated with diabetes (HCC) 08/19/2022   Cough 08/19/2022   Aortic atherosclerosis (HCC) 08/19/2022   Diabetes mellitus type I (HCC) 06/02/2022   Hyponatremia 03/14/2022   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 associated with type 1 diabetes mellitus (HCC) 08/16/2021   Primary osteoarthritis of both knees 11/30/2019   Severe nonproliferative diabetic retinopathy of right eye, without macular edema, associated with type 1 diabetes mellitus (HCC) 11/04/2019   Severe nonproliferative diabetic retinopathy of left eye, without macular edema, associated with type 1 diabetes mellitus (HCC) 11/04/2019   Chronic pain syndrome 11/13/2018   Snoring 08/12/2018   Breast lump 08/12/2018   Lung nodule 08/12/2018   Diabetic peripheral neuropathy (HCC) 01/11/2018  Low back pain with left-sided sciatica 11/29/2017   Multinodular goiter 07/29/2015   Restless leg 11/07/2013   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   Cholesteatoma 09/11/2011   Allergy to environmental factors 09/11/2011   Anxiety 05/01/2011   INSOMNIA 02/18/2010   GERD 12/21/2008   SMOKER 09/25/2007    PCP: Dana Allan MD  REFERRING PROVIDER: Drake Leach PA-C  REFERRING DIAG: [phone] (ICD-10-CM) -  Spondylolisthesis of lumbar region M51.36 (ICD-10-CM) - DDD (degenerative disc disease), lumbar M54.16 (ICD-10-CM) - Lumbar radiculopathy G62.9 (ICD-10-CM) - Neuropathy  Rationale for Evaluation and Treatment: Rehabilitation  THERAPY DIAG:  Other low back pain  Muscle weakness (generalized)  Difficulty in walking, not elsewhere classified  ONSET DATE: ~9-10 years ago   SUBJECTIVE:                                                                                                                                                                                           SUBJECTIVE STATEMENT: Pt is a 47 y.o. female referred to OPPT for chronic LBP. Pain began ~10 years ago. Pain is upper lower back that refers in dermatomal pattern from buttocks then wrap around to anterior thighs. Pain has increased in severity in the past year where pt reports "I can't do anything anymore". Pain described as tightness, achey. LBP is sharp, dull, and achey. Reports her back feels like a "brick" or "block". Aggravating factors, include laying down on her back, standing, walking, bending over. Reports she has a hard time standing long enough to wash her dishes. Can complete short, community errands. Worst pain described as a 9/10 NPS. Best pain 3-4/10 NPS. Currently 6/10 NPS. Pain improved with rest, hook lying, and medications.  PERTINENT HISTORY:  Pt is a 47 y.o. female referred to OPPT for chronic LBP. Pain began ~10 years ago. Pain is upper lower back that refers in dermatomal pattern from buttocks then wrap around to anterior thighs. Pain has increased in severity in the past year where pt reports "I can't do anything anymore". Pain described as tightness, achey. LBP is sharp, dull, and achey. Reports her back feels like a "brick" or "block". Aggravating factors, include laying down on her back, standing, walking, bending over. Reports she has a hard time standing long enough to wash her dishes. Can complete short,  community errands. Worst pain described as a 9/10 NPS. Best pain 3-4/10 NPS. Currently 6/10 NPS. Pain improved with rest, hook lying, and medications.  PAIN:  Are you having pain? Yes: NPRS scale: 6/10 Pain location: Lower back paraspinals Pain description: achey, sharp Aggravating factors: bending over, walking, standing, sleeping Relieving factors: medication  PRECAUTIONS: None  WEIGHT BEARING RESTRICTIONS: No  FALLS:  Has patient fallen in last 6 months? 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 along with severe DDD and bilateral moderate/severe foraminal stenosis  PATIENT SURVEYS:  FOTO Deferred to next session  SCREENING FOR RED FLAGS: Bowel or bladder incontinence: No Spinal tumors: No Cauda equina syndrome: No Compression fracture: No Abdominal aneurysm: No  COGNITION: Overall cognitive status: Within functional limits for tasks assessed     SENSATION: Light touch: WFL  MUSCLE LENGTH: Deferred to next session  Hamstrings: Right  deg; Left  deg Maisie Fus test: Right  deg; Left  deg  POSTURE:  rounded shoulders, forward head, and decreased lumbar lordosis L hip hike in standing   PALPATION: TTP along B upper glut meds, B paraspinals from S1 to T8 (bra strap)   LUMBAR ROM:   AROM eval  Flexion 50%*  Extension 25%*  Right lateral flexion 50%*  Left lateral flexion 50%*  Right rotation 75%*  Left rotation 75%*   (Blank rows = not tested)  LOWER EXTREMITY ROM:     Active  Right eval Left eval  Hip flexion    Hip extension    Hip abduction    Hip adduction    Hip internal rotation    Hip external rotation    Knee flexion    Knee extension    Ankle dorsiflexion    Ankle plantarflexion    Ankle inversion    Ankle eversion     (Blank rows = not tested)  LOWER EXTREMITY MMT:    MMT  Right eval Left eval  Hip flexion    Hip extension    Hip abduction    Hip adduction    Hip internal rotation    Hip external rotation    Knee flexion    Knee extension    Ankle dorsiflexion    Ankle plantarflexion    Ankle inversion    Ankle eversion     (Blank rows = not tested)  LUMBAR SPECIAL TESTS:  Straight leg raise test: Negative, Slump test: Negative, and FABER test: Positive  FADDIR: Negative bilat  FUNCTIONAL TESTS:  5 times sit to stand: 21.43 seconds   JOINT MOBILITY: Lumbar CPA's and UPA's hypomobile and painful L5-T12  TODAY'S TREATMENT:                                                                                                                              DATE: 01/16/23  Access Code: [email or web address] URL: [email or web address] Date: 01/16/2023 Prepared by: Ronnie Derby  Exercises - Sit to Stand Without Arm Support  - 1 x daily - 7 x weekly - 3 sets - 8 reps - Supine Gluteal Sets  - 1 x daily - 7 x weekly -  3 sets - 10 reps - 5 hold - Supine Double Knee to Chest  - 1 x daily - 7 x weekly - 2 sets - 8 reps - 10 hold    PATIENT EDUCATION:  Education details: POC, prognosis, HEP (reps, sets, frequency) Person educated: Patient Education method: Explanation and Handouts Education comprehension: verbalized understanding and needs further education  HOME EXERCISE PROGRAM: Access Code: [email or web address] URL: [email or web address] Date: 01/16/2023 Prepared by: Ronnie Derby  Exercises - Sit to Stand Without Arm Support  - 1 x daily - 7 x weekly - 3 sets - 8 reps - Supine Gluteal Sets  - 1 x daily - 7 x weekly - 3 sets - 10 reps - 5 hold - Supine Double Knee to Chest  - 1 x daily - 7 x weekly - 2 sets - 8 reps - 10 hold  ASSESSMENT:  CLINICAL IMPRESSION: Patient is a 47 y.o. female who was seen today for physical therapy evaluation and treatment for chronic LBP. Pt late to evaluation thus generally limited on objective data this date  with further testing to be performed at f/u visit. Pt without red flag signs/symptoms and negative testing for lumbar radiculopathy. Pt does have positive FABER testing indicative of possible hip/SIJ pathology could be contributing to LBP as this is concordant. Not formally tested but pt demonstrates excellent hip joint mobility bilaterally. Pt presents with significant hypomobility and AROM limitations in lumbar and thoracic spine in all planes with concordant pain and postural abnormality with L elevated hip hike in standing. Pt also presents with LE weakness as evidenced by 5xSTS. These impairments are limiting pt in work and completing household and community ADL's. Pt will benefit from skilled PT services to address these impairments and maximize functional mobility to improve QoL.  OBJECTIVE IMPAIRMENTS: decreased mobility, difficulty walking, decreased ROM, decreased strength, hypomobility, impaired flexibility, and pain.   ACTIVITY LIMITATIONS: lifting, bending, sitting, standing, squatting, and sleeping  PARTICIPATION LIMITATIONS: cleaning, laundry, community activity, occupation, and yard work  PERSONAL FACTORS: Age, Past/current experiences, Time since onset of injury/illness/exacerbation, and 3+ comorbidities: DM type 2, HTN, HLD, OA, peripheral polyneuropathy   are also affecting patient's functional outcome.   REHAB POTENTIAL: Poor Chronicity of symptoms, PMH, smoking history  CLINICAL DECISION MAKING: Evolving/moderate complexity  EVALUATION COMPLEXITY: Moderate   GOALS: Goals reviewed with patient? No  SHORT TERM GOALS: Target date: 02/13/23  Pt will be independent with HEP to improve pain, Lumbar AROM, and LE strength to maximize functional capacity.  Baseline: 01/16/23: given HEP Goal status: INITIAL  LONG TERM GOALS: Target date: 03/13/23  Pt will improve FOTO to target score to demonstrate clinically significant improvement in functional mobility. Baseline: 01/16/23:  deferred to next session Goal status: INITIAL  2.  Pt will improve 5xSTS to 12 seconds or less to demonstrate clinically significant improvement in LE strength to assist in tolerance for standing and walking ADL's Baseline: 01/16/23: 21.43 seconds Goal status: INITIAL  3.  Pt will improve 6 MWT by 165' to demonstrate clinically significant improvement in community walking distances. Baseline: 01/16/23: deferred to next session Goal status: INITIAL  4.  Pt will report worst pain as a 6/10 NPS with functional tasks to demonstrate clinically significant improvement in functional mobility  Baseline: 01/16/23: worst pain 9/10 NPS Goal status: INITIAL   PLAN:  PT FREQUENCY: 1-2x/week  PT DURATION: 8 weeks  PLANNED INTERVENTIONS: Therapeutic exercises, Therapeutic activity, Neuromuscular re-education, Balance training, Gait training, Patient/Family education, Self Care, Joint mobilization, Joint manipulation, Aquatic  Therapy, Dry Needling, Electrical stimulation, Spinal manipulation, Spinal mobilization, Cryotherapy, Moist heat, Manual therapy, and Re-evaluation.  PLAN FOR NEXT SESSION: FOTO, 6 MWT, thomas test, hamstring length, MMT, hip AROM, review HEP   Delphia Grates. Fairly IV, PT, DPT Physical Therapist- [place]  [facility]  01/16/2023, 3:32 PM

## 2023-01-22 ENCOUNTER — Ambulatory Visit: Payer: MEDICAID

## 2023-01-22 ENCOUNTER — Other Ambulatory Visit: Payer: Self-pay | Admitting: Pulmonary Disease

## 2023-01-22 DIAGNOSIS — R262 Difficulty in walking, not elsewhere classified: Secondary | ICD-10-CM

## 2023-01-22 DIAGNOSIS — M5459 Other low back pain: Secondary | ICD-10-CM | POA: Diagnosis not present

## 2023-01-22 DIAGNOSIS — M6281 Muscle weakness (generalized): Secondary | ICD-10-CM

## 2023-01-22 NOTE — Therapy (Signed)
OUTPATIENT PHYSICAL THERAPY THORACOLUMBAR TREATMENT   Patient Name: [name] MRN: [id number] DOB:1976/05/28, 47 y.o., female Today's Date: 01/22/2023  END OF SESSION:  PT End of Session - 01/22/23 1436     Visit Number 2    Number of Visits 17    Date for PT Re-Evaluation 03/13/23    PT Start Time 1435    PT Stop Time 1515    PT Time Calculation (min) 40 min    Activity Tolerance Patient tolerated treatment well    Behavior During Therapy [facility] for tasks assessed/performed             Past Medical History:  Diagnosis Date   Absence of bladder continence 04/01/2010   Overview:   Urinary Loss Of Control   Acid reflux 04/01/2010   Overview:    Acute gastroenteritis 03/14/2022   Anxiety state, unspecified 03/26/2009   Arthralgia 08/14/2018   ASTHMA 02/02/2007   Asthma    Chest wall pain 08/02/2018   DEPRESSION 02/16/2009   DIABETES MELLITUS, TYPE I 02/02/2007   Dizziness 09/18/2018   Eczema 08/02/2018   Elevated cholesterol 06/02/2022   Encounter to establish care 06/02/2022   Endometriosis    Essential hypertension    GERD 12/21/2008   Herpes    at 18   Hyperlipidemia    Hypertension 03/03/2020   Lesion of vocal cord    Mild intermittent asthma without complication 08/12/2018   Osteoarthritis    Peripheral polyneuropathy 11/29/2017   Primary osteoarthritis of both feet 11/30/2019   Primary osteoarthritis of both hands 11/30/2019   Bilateral mild osteoarthritis.   Retinal hemorrhage of right eye 11/04/2019   Severe nonproliferative diabetic retinopathy of right eye, without macular edema, associated with type 1 diabetes mellitus (HCC) 11/04/2019   Shortness of breath 09/25/2007   Uncontrolled type 1 diabetes mellitus with hyperglycemia, with long-term current use of insulin (HCC) 03/14/2022          Uncontrolled type 2 diabetes mellitus with hyperglycemia, with long-term current use of insulin (HCC) 03/14/2022   Vitreous hemorrhage of right eye (HCC)  08/16/2021   Vocal fold leukoplakia    Past Surgical History:  Procedure Laterality Date   ABDOMINAL HYSTERECTOMY  2009   BSO   DIRECT LARYNGOSCOPY N/A 08/17/2017   Procedure: DIRECT LARYNGOSCOPY WITH OPERATING TELESCOPE WITH BIOPSY;  Surgeon: Graylin Shiver, MD;  Location: [facility] OR;  Service: ENT;  Laterality: N/A;   NASOPHARYNGEAL BIOPSY Right 08/17/2017   Procedure: NASOPHARYNGEAL BIOPSY RIGHT NASAL LESION;  Surgeon: Graylin Shiver, MD;  Location: [facility] OR;  Service: ENT;  Laterality: Right;   Patient Active Problem List   Diagnosis Date Noted   Emphysema lung (HCC) 08/19/2022   Gastroparesis due to DM (HCC) 08/19/2022   Benzodiazepine dependence (HCC) 08/19/2022   Hypertension associated with diabetes (HCC) 08/19/2022   Cough 08/19/2022   Aortic atherosclerosis (HCC) 08/19/2022   Diabetes mellitus type I (HCC) 06/02/2022   Hyponatremia 03/14/2022   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 associated with type 1 diabetes mellitus (HCC) 08/16/2021   Primary osteoarthritis of both knees 11/30/2019   Severe nonproliferative diabetic retinopathy of right eye, without macular edema, associated with type 1 diabetes mellitus (HCC) 11/04/2019   Severe nonproliferative diabetic retinopathy of left eye, without macular edema, associated with type 1 diabetes mellitus (HCC) 11/04/2019   Chronic pain syndrome 11/13/2018   Snoring 08/12/2018   Breast lump 08/12/2018   Lung nodule 08/12/2018   Diabetic peripheral neuropathy (HCC) 01/11/2018  Low back pain with left-sided sciatica 11/29/2017   Multinodular goiter 07/29/2015   Restless leg 11/07/2013   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   Cholesteatoma 09/11/2011   Allergy to environmental factors 09/11/2011   Anxiety 05/01/2011   INSOMNIA 02/18/2010   GERD 12/21/2008   SMOKER 09/25/2007    PCP: Dana Allan MD  REFERRING PROVIDER: Drake Leach PA-C  REFERRING DIAG: [phone] (ICD-10-CM) -  Spondylolisthesis of lumbar region M51.36 (ICD-10-CM) - DDD (degenerative disc disease), lumbar M54.16 (ICD-10-CM) - Lumbar radiculopathy G62.9 (ICD-10-CM) - Neuropathy  Rationale for Evaluation and Treatment: Rehabilitation  THERAPY DIAG:  Other low back pain  Muscle weakness (generalized)  Difficulty in walking, not elsewhere classified  ONSET DATE: ~9-10 years ago   SUBJECTIVE:                                                                                                                                                                                           SUBJECTIVE STATEMENT: Pt reports worsening LBP since eval. Pt reports this is the norm where she has periods where her LBP flares up. Unsure if it is related to her HEP or not. Does report worsening of her LBP while trying some of her HEP however. Currently 7/10 NPS. Onset of some RLE radicular pain into foot in sciatic distribution.  PERTINENT HISTORY:  Pt is a 47 y.o. female referred to OPPT for chronic LBP. Pain began ~10 years ago. Pain is upper lower back that refers in dermatomal pattern from buttocks then wrap around to anterior thighs. Pain has increased in severity in the past year where pt reports "I can't do anything anymore". Pain described as tightness, achey. LBP is sharp, dull, and achey. Reports her back feels like a "brick" or "block". Aggravating factors, include laying down on her back, standing, walking, bending over. Reports she has a hard time standing long enough to wash her dishes. Can complete short, community errands. Worst pain described as a 9/10 NPS. Best pain 3-4/10 NPS. Currently 6/10 NPS. Pain improved with rest, hook lying, and medications.  PAIN:  Are you having pain? Yes: NPRS scale: 7/10 Pain location: Lower back paraspinals Pain description: achey, sharp Aggravating factors: bending over, walking, standing, sleeping Relieving factors: medication  PRECAUTIONS: None  WEIGHT BEARING  RESTRICTIONS: No  FALLS:  Has patient fallen in last 6 months? 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 along with severe DDD and bilateral moderate/severe foraminal stenosis  PATIENT SURVEYS:  FOTO Deferred to next session  SCREENING FOR RED FLAGS: Bowel or bladder incontinence: No Spinal tumors: No Cauda equina syndrome: No Compression fracture: No Abdominal aneurysm: No  COGNITION: Overall cognitive status: Within functional limits for tasks assessed     SENSATION: Light touch: WFL  MUSCLE LENGTH: Hamstrings SLR: Right  10 deg; Left 12  deg Thomas test: Right 15 deg; Left 15 deg  POSTURE:  rounded shoulders, forward head, and decreased lumbar lordosis L hip hike in standing   PALPATION: TTP along B upper glut meds, B paraspinals from S1 to T8 (bra strap)   LUMBAR ROM:   AROM eval  Flexion 50%*  Extension 25%*  Right lateral flexion 50%*  Left lateral flexion 50%*  Right rotation 75%*  Left rotation 75%*   (Blank rows = not tested)  LOWER EXTREMITY ROM:     Active  Right eval Left eval  Hip flexion    Hip extension    Hip abduction    Hip adduction    Hip internal rotation    Hip external rotation    Knee flexion    Knee extension    Ankle dorsiflexion    Ankle plantarflexion    Ankle inversion    Ankle eversion     (Blank rows = not tested)  LOWER EXTREMITY MMT:    MMT Right eval Left eval  Hip flexion 3+ 3+  Hip extension    Hip abduction 4 4  Hip adduction    Hip internal rotation 4 4  Hip external rotation 4 4  Knee flexion 4 4  Knee extension 5 4  Ankle dorsiflexion 5 5  Ankle plantarflexion 5 5  Ankle inversion    Ankle eversion     (Blank rows = not tested)  LUMBAR SPECIAL TESTS:  Straight leg raise test: Negative, Slump test: Negative, and FABER test:  Positive  FADDIR: Negative bilat  FUNCTIONAL TESTS:  5 times sit to stand: 21.43 seconds   JOINT MOBILITY: Lumbar CPA's and UPA's hypomobile and painful L5-T12  TODAY'S TREATMENT:                                                                                                                              DATE: 01/22/23  [email or web address] Contes: 19/36  See MMT, muscle length, and charts above for further evaluation of LBP that was not performed at last session due to time constraints.   Reviewed seated hamstring stretch: 2-3 reps, 10 sec holds   Reviewed prone quad/hip flexor stretch: x2 reps, 10-15 sec holds with varying degrees of knee flexion   PATIENT EDUCATION:  Education details: POC, prognosis, HEP (reps, sets, frequency) Person educated: Patient Education method: Explanation and Handouts Education comprehension: verbalized understanding and needs further education  HOME EXERCISE PROGRAM: Access Code: [email or web address] URL: [email or web address] Date: 01/22/2023 Prepared by: Ronnie Derby  Exercises - Sit to Stand Without Arm Support  - 1 x daily -  7 x weekly - 3 sets - 8 reps - Supine Gluteal Sets  - 1 x daily - 7 x weekly - 3 sets - 10 reps - 5 hold - Seated Hamstring Stretch  - 1 x daily - 7 x weekly - 1 sets - 3 reps - 10 hold - Prone Lying with Towel Roll (Hip Flexor Stretch) (BKA)  - 1 x daily - 7 x weekly - 1 sets - 3 reps - 10 hold  Access Code: [email or web address] URL: [email or web address] Date: 01/16/2023 Prepared by: Ronnie Derby  Exercises - Sit to Stand Without Arm Support  - 1 x daily - 7 x weekly - 3 sets - 8 reps - Supine Gluteal Sets  - 1 x daily - 7 x weekly - 3 sets - 10 reps - 5 hold - Supine Double Knee to Chest  - 1 x daily - 7 x weekly - 2 sets - 8 reps - 10 hold  ASSESSMENT:  CLINICAL IMPRESSION: Majority of session with focus on further assessment on LBP. Pt presents with generalized Le weakness via MMT likely due to high pain  levels. Pt also with significant hip flexor and hamstring tightness with concordant LBP which is commonly seen in anterolisthesis/pars defects. Updated HEP to address flexibility impairments and strength impairments. Will perform 2 or 6 minute walking test next session to assess tolerance for community walking distances. Pt understanding of updated HEP. Pt will benefit from skilled PT services to address these impairments and maximize functional mobility to improve QoL.  OBJECTIVE IMPAIRMENTS: decreased mobility, difficulty walking, decreased ROM, decreased strength, hypomobility, impaired flexibility, and pain.   ACTIVITY LIMITATIONS: lifting, bending, sitting, standing, squatting, and sleeping  PARTICIPATION LIMITATIONS: cleaning, laundry, community activity, occupation, and yard work  PERSONAL FACTORS: Age, Past/current experiences, Time since onset of injury/illness/exacerbation, and 3+ comorbidities: DM type 2, HTN, HLD, OA, peripheral polyneuropathy   are also affecting patient's functional outcome.   REHAB POTENTIAL: Poor Chronicity of symptoms, PMH, smoking history  CLINICAL DECISION MAKING: Evolving/moderate complexity  EVALUATION COMPLEXITY: Moderate   GOALS: Goals reviewed with patient? No  SHORT TERM GOALS: Target date: 02/13/23  Pt will be independent with HEP to improve pain, Lumbar AROM, and LE strength to maximize functional capacity.  Baseline: 01/16/23: given HEP Goal status: INITIAL  LONG TERM GOALS: Target date: 03/13/23  Pt will improve FOTO to target score to demonstrate clinically significant improvement in functional mobility. Baseline: 01/16/23: deferred to next session Goal status: INITIAL  2.  Pt will improve 5xSTS to 12 seconds or less to demonstrate clinically significant improvement in LE strength to assist in tolerance for standing and walking ADL's Baseline: 01/16/23: 21.43 seconds Goal status: INITIAL  3.  Pt will improve 6 MWT by 165' to demonstrate  clinically significant improvement in community walking distances. Baseline: 01/16/23: deferred to next session Goal status: INITIAL  4.  Pt will report worst pain as a 6/10 NPS with functional tasks to demonstrate clinically significant improvement in functional mobility  Baseline: 01/16/23: worst pain 9/10 NPS Goal status: INITIAL   PLAN:  PT FREQUENCY: 1-2x/week  PT DURATION: 8 weeks  PLANNED INTERVENTIONS: Therapeutic exercises, Therapeutic activity, Neuromuscular re-education, Balance training, Gait training, Patient/Family education, Self Care, Joint mobilization, Joint manipulation, Aquatic Therapy, Dry Needling, Electrical stimulation, Spinal manipulation, Spinal mobilization, Cryotherapy, Moist heat, Manual therapy, and Re-evaluation.  PLAN FOR NEXT SESSION: 6 MWT, LE strength, gentle mobility of hips and lower back  Mateusz Neilan M. Fairly IV, PT, DPT Physical Therapist- [place]  [facility]  01/22/2023, 4:09 PM

## 2023-01-23 ENCOUNTER — Other Ambulatory Visit: Payer: Self-pay

## 2023-01-23 ENCOUNTER — Telehealth: Payer: Self-pay | Admitting: Orthopedic Surgery

## 2023-01-23 ENCOUNTER — Encounter (HOSPITAL_BASED_OUTPATIENT_CLINIC_OR_DEPARTMENT_OTHER): Payer: Self-pay | Admitting: Pulmonary Disease

## 2023-01-23 DIAGNOSIS — E1142 Type 2 diabetes mellitus with diabetic polyneuropathy: Secondary | ICD-10-CM

## 2023-01-23 MED ORDER — DEXCOM G6 TRANSMITTER MISC
3 refills | Status: DC
Start: 2023-01-23 — End: 2023-12-24

## 2023-01-23 MED ORDER — DEXCOM G6 SENSOR MISC
1.00 | 1 refills | Status: AC
Start: 2023-01-23 — End: ?

## 2023-01-23 MED ORDER — DEXCOM G7 SENSOR MISC: 3 refills | Status: DC

## 2023-01-23 NOTE — Progress Notes (Signed)
Telephone Visit- Progress Note: Referring Physician:  Dana Allan, MD [address] [place],  [place] [address]  Primary Physician:  Dana Allan, MD  This visit was performed via telephone.  Patient location: home Provider location: office  I spent a total of 10 minutes non-face-to-face activities for this visit on the date of this encounter including review of current clinical condition and response to treatment.    Patient has given verbal consent to this telephone visits and we reviewed the limitations of a telephone visit. Patient wishes to proceed.    Chief Complaint:  follow up  History of Present Illness: [name] is a 47 y.o. female has a history of HTN, DM, asthma/COPD, emphysema, GERD, goiter, DM peripheral neuropathy, chronic pain syndrome.     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 LBP likely due to L5-S1. Thigh pain may be lumbar medidated. Numbness/tingling from knees down is likely due to her neuropathy that was seen on previous EMG.   She was sent to PT (initial eval on 01/16/23 and had a visit on 01/22/23). Has not heard back from pain management.   She is about the same as she was at her last visit. She continues with constant LBP with bilateral thigh (entire leg) pain to her knees. She has lot of stiffness. She has numbness and tingling in her legs along with weakness. No specific alleviating or aggravating factors, she hurts all the time. She has OA in both knees and they told her to get back fixed prior to TKAs.   Has not been called about pain management ([facility]).   Last HgbA1c on 01/09/23 was 9.4. She is working on this.    Bowel/Bladder Dysfunction: none   She smokes 1 ppd x 20 years. She is working on quitting and is at 1/2 to 1 ppd.    Conservative measures:  Physical therapy: initial eval on 01/16/23 and had a visit on 01/22/23 Multimodal medical  therapy including regular antiinflammatories: voltaren, neurontin, ibuprofen, mobic, ultram, norco Injections: Unable to have epidural steroid injections due to elevation of blood sugars post injection   Past Surgery: denies spine surgery  The symptoms are causing a significant impact on the patient's life.   Exam: No exam done as this was a telephone encounter.     Imaging: none  Assessment and Plan: [name] is a pleasant 47 y.o. female who continues with constant LBP with bilateral thigh (entire leg) pain to her knees. She has numbness, tingling, and weakness in her legs along with lots of stiffness. No specific aggravating factors.    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 LBP likely due to L5-S1. Thigh pain may be lumbar medidated. Numbness/tingling from knees down is likely due to her neuropathy.    Treatment options discussed with patient and following plan made:    - Continue with PT for lumbar spine.  - Unable to have ESIs as they elevate her blood sugars.  - Continue neurontin from other providers.  - Discussed she may need to get updated EMG, DEXA scan, and lumbar flexion/extension xrays prior to any surgical consideration.  - Smoking cessation discussed and encouraged.  - HgBA1c would need to be less than 7.5 prior to any surgery. It was 9.4 on 01/09/23.  - Referral done to pain management ([facility] and [facility]).  - Follow up scheduled with me in 6-8 weeks.   Drake Leach PA-C Neurosurgery

## 2023-01-23 NOTE — Telephone Encounter (Signed)
Patient would like to let you know that yesterday she went to PT and her pain increased. She has not been contacted by [facility]. What should she do? She has a telephone visit on 01/29/2023.

## 2023-01-23 NOTE — Telephone Encounter (Signed)
Recommend that she continue with PT. She should talk to the therapist and let them know what hurts- they should be able to avoid the exercises that make her worse.   She can try calling [facility] about her referral.   Can consider referral other pain clinics- can she travel to [place]?

## 2023-01-23 NOTE — Telephone Encounter (Signed)
Pt needs a prescription for Spiriva  [facility] in [place]

## 2023-01-24 ENCOUNTER — Telehealth: Payer: Self-pay | Admitting: Endocrinology

## 2023-01-24 MED ORDER — SPIRIVA RESPIMAT 2.5 MCG/ACT IN AERS: 2.0000 | INHALATION_SPRAY | Freq: Every day | RESPIRATORY_TRACT | 2 refills | Status: DC

## 2023-01-24 NOTE — Telephone Encounter (Signed)
Please update status of the sensor approval

## 2023-01-24 NOTE — Telephone Encounter (Signed)
Both were approved and documented on 01/11/23

## 2023-01-29 ENCOUNTER — Telehealth: Payer: Self-pay | Admitting: Orthopedic Surgery

## 2023-01-29 ENCOUNTER — Ambulatory Visit (INDEPENDENT_AMBULATORY_CARE_PROVIDER_SITE_OTHER): Payer: MEDICAID | Admitting: Orthopedic Surgery

## 2023-01-29 ENCOUNTER — Encounter: Payer: Self-pay | Admitting: Orthopedic Surgery

## 2023-01-29 ENCOUNTER — Ambulatory Visit: Payer: MEDICAID

## 2023-01-29 DIAGNOSIS — M51369 Other intervertebral disc degeneration, lumbar region without mention of lumbar back pain or lower extremity pain: Secondary | ICD-10-CM

## 2023-01-29 DIAGNOSIS — M5136 Other intervertebral disc degeneration, lumbar region: Secondary | ICD-10-CM

## 2023-01-29 DIAGNOSIS — M5116 Intervertebral disc disorders with radiculopathy, lumbar region: Secondary | ICD-10-CM | POA: Diagnosis not present

## 2023-01-29 DIAGNOSIS — G629 Polyneuropathy, unspecified: Secondary | ICD-10-CM | POA: Diagnosis not present

## 2023-01-29 DIAGNOSIS — M4316 Spondylolisthesis, lumbar region: Secondary | ICD-10-CM

## 2023-01-29 DIAGNOSIS — M5416 Radiculopathy, lumbar region: Secondary | ICD-10-CM

## 2023-01-29 NOTE — Telephone Encounter (Signed)
Referral faxed to both places.

## 2023-01-29 NOTE — Telephone Encounter (Signed)
I did external referral to pain management for her.   Please send to [facility] in [place] and [facility] in [place].   Thanks!

## 2023-01-31 ENCOUNTER — Ambulatory Visit: Payer: MEDICAID

## 2023-02-01 ENCOUNTER — Ambulatory Visit (HOSPITAL_BASED_OUTPATIENT_CLINIC_OR_DEPARTMENT_OTHER): Payer: MEDICAID | Admitting: Pulmonary Disease

## 2023-02-05 ENCOUNTER — Ambulatory Visit: Payer: MEDICAID

## 2023-02-05 DIAGNOSIS — M5459 Other low back pain: Secondary | ICD-10-CM | POA: Diagnosis not present

## 2023-02-05 DIAGNOSIS — M6281 Muscle weakness (generalized): Secondary | ICD-10-CM

## 2023-02-05 DIAGNOSIS — R262 Difficulty in walking, not elsewhere classified: Secondary | ICD-10-CM

## 2023-02-05 NOTE — Therapy (Signed)
OUTPATIENT PHYSICAL THERAPY THORACOLUMBAR TREATMENT   Patient Name: [name] MRN: [id number] DOB:1975/09/04, 47 y.o., female Today's Date: 02/05/2023  END OF SESSION:  PT End of Session - 02/05/23 1047     Visit Number 3    Number of Visits 17    Date for PT Re-Evaluation 03/13/23    PT Start Time 1043    PT Stop Time 1115    PT Time Calculation (min) 32 min    Activity Tolerance Patient tolerated treatment well    Behavior During Therapy [facility] for tasks assessed/performed             Past Medical History:  Diagnosis Date   Absence of bladder continence 04/01/2010   Overview:   Urinary Loss Of Control   Acid reflux 04/01/2010   Overview:    Acute gastroenteritis 03/14/2022   Anxiety state, unspecified 03/26/2009   Arthralgia 08/14/2018   ASTHMA 02/02/2007   Asthma    Chest wall pain 08/02/2018   DEPRESSION 02/16/2009   DIABETES MELLITUS, TYPE I 02/02/2007   Dizziness 09/18/2018   Eczema 08/02/2018   Elevated cholesterol 06/02/2022   Encounter to establish care 06/02/2022   Endometriosis    Essential hypertension    GERD 12/21/2008   Herpes    at 18   Hyperlipidemia    Hypertension 03/03/2020   Lesion of vocal cord    Mild intermittent asthma without complication 08/12/2018   Osteoarthritis    Peripheral polyneuropathy 11/29/2017   Primary osteoarthritis of both feet 11/30/2019   Primary osteoarthritis of both hands 11/30/2019   Bilateral mild osteoarthritis.   Retinal hemorrhage of right eye 11/04/2019   Severe nonproliferative diabetic retinopathy of right eye, without macular edema, associated with type 1 diabetes mellitus (HCC) 11/04/2019   Shortness of breath 09/25/2007   Uncontrolled type 1 diabetes mellitus with hyperglycemia, with long-term current use of insulin (HCC) 03/14/2022          Uncontrolled type 2 diabetes mellitus with hyperglycemia, with long-term current use of insulin (HCC) 03/14/2022   Vitreous hemorrhage of right eye (HCC)  08/16/2021   Vocal fold leukoplakia    Past Surgical History:  Procedure Laterality Date   ABDOMINAL HYSTERECTOMY  2009   BSO   DIRECT LARYNGOSCOPY N/A 08/17/2017   Procedure: DIRECT LARYNGOSCOPY WITH OPERATING TELESCOPE WITH BIOPSY;  Surgeon: Graylin Shiver, MD;  Location: [facility] OR;  Service: ENT;  Laterality: N/A;   NASOPHARYNGEAL BIOPSY Right 08/17/2017   Procedure: NASOPHARYNGEAL BIOPSY RIGHT NASAL LESION;  Surgeon: Graylin Shiver, MD;  Location: [facility] OR;  Service: ENT;  Laterality: Right;   Patient Active Problem List   Diagnosis Date Noted   Emphysema lung (HCC) 08/19/2022   Gastroparesis due to DM (HCC) 08/19/2022   Benzodiazepine dependence (HCC) 08/19/2022   Hypertension associated with diabetes (HCC) 08/19/2022   Cough 08/19/2022   Aortic atherosclerosis (HCC) 08/19/2022   Diabetes mellitus type I (HCC) 06/02/2022   Hyponatremia 03/14/2022   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 associated with type 1 diabetes mellitus (HCC) 08/16/2021   Primary osteoarthritis of both knees 11/30/2019   Severe nonproliferative diabetic retinopathy of right eye, without macular edema, associated with type 1 diabetes mellitus (HCC) 11/04/2019   Severe nonproliferative diabetic retinopathy of left eye, without macular edema, associated with type 1 diabetes mellitus (HCC) 11/04/2019   Chronic pain syndrome 11/13/2018   Snoring 08/12/2018   Breast lump 08/12/2018   Lung nodule 08/12/2018   Diabetic peripheral neuropathy (HCC) 01/11/2018  Low back pain with left-sided sciatica 11/29/2017   Multinodular goiter 07/29/2015   Restless leg 11/07/2013   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   Cholesteatoma 09/11/2011   Allergy to environmental factors 09/11/2011   Anxiety 05/01/2011   INSOMNIA 02/18/2010   GERD 12/21/2008   SMOKER 09/25/2007    PCP: Dana Allan MD  REFERRING PROVIDER: Drake Leach PA-C  REFERRING DIAG: [phone] (ICD-10-CM) -  Spondylolisthesis of lumbar region M51.36 (ICD-10-CM) - DDD (degenerative disc disease), lumbar M54.16 (ICD-10-CM) - Lumbar radiculopathy G62.9 (ICD-10-CM) - Neuropathy  Rationale for Evaluation and Treatment: Rehabilitation  THERAPY DIAG:  Other low back pain  Muscle weakness (generalized)  Difficulty in walking, not elsewhere classified  ONSET DATE: ~9-10 years ago   SUBJECTIVE:                                                                                                                                                                                           SUBJECTIVE STATEMENT: Pt reports LBP at a 6/10 NPS. Reports worsening of symptoms for 1 week after last session.  PERTINENT HISTORY:  Pt is a 47 y.o. female referred to OPPT for chronic LBP. Pain began ~10 years ago. Pain is upper lower back that refers in dermatomal pattern from buttocks then wrap around to anterior thighs. Pain has increased in severity in the past year where pt reports "I can't do anything anymore". Pain described as tightness, achey. LBP is sharp, dull, and achey. Reports her back feels like a "brick" or "block". Aggravating factors, include laying down on her back, standing, walking, bending over. Reports she has a hard time standing long enough to wash her dishes. Can complete short, community errands. Worst pain described as a 9/10 NPS. Best pain 3-4/10 NPS. Currently 6/10 NPS. Pain improved with rest, hook lying, and medications.  PAIN:  Are you having pain? Yes: NPRS scale: 7/10 Pain location: Lower back paraspinals Pain description: achey, sharp Aggravating factors: bending over, walking, standing, sleeping Relieving factors: medication  PRECAUTIONS: None  WEIGHT BEARING RESTRICTIONS: No  FALLS:  Has patient fallen in last 6 months? No   OCCUPATION: Does not work   PLOF: Independent  PATIENT GOALS: Improve her pain   NEXT MD VISIT: After PT   OBJECTIVE:   DIAGNOSTIC FINDINGS:  She has  known slip at L5-S1 with pars at L5 along with severe DDD and bilateral moderate/severe foraminal stenosis  PATIENT SURVEYS:  FOTO Deferred to next session  SCREENING FOR RED FLAGS: Bowel or bladder incontinence: No Spinal tumors: No Cauda equina syndrome: No Compression fracture: No Abdominal aneurysm: No  COGNITION: Overall cognitive status: Within functional limits for  tasks assessed     SENSATION: Light touch: WFL  MUSCLE LENGTH: Hamstrings SLR: Right  10 deg; Left 12  deg Thomas test: Right 15 deg; Left 15 deg  POSTURE:  rounded shoulders, forward head, and decreased lumbar lordosis L hip hike in standing   PALPATION: TTP along B upper glut meds, B paraspinals from S1 to T8 (bra strap)   LUMBAR ROM:   AROM eval  Flexion 50%*  Extension 25%*  Right lateral flexion 50%*  Left lateral flexion 50%*  Right rotation 75%*  Left rotation 75%*   (Blank rows = not tested)  LOWER EXTREMITY ROM:     Active  Right eval Left eval  Hip flexion    Hip extension    Hip abduction    Hip adduction    Hip internal rotation    Hip external rotation    Knee flexion    Knee extension    Ankle dorsiflexion    Ankle plantarflexion    Ankle inversion    Ankle eversion     (Blank rows = not tested)  LOWER EXTREMITY MMT:    MMT Right eval Left eval  Hip flexion 3+ 3+  Hip extension    Hip abduction 4 4  Hip adduction    Hip internal rotation 4 4  Hip external rotation 4 4  Knee flexion 4 4  Knee extension 5 4  Ankle dorsiflexion 5 5  Ankle plantarflexion 5 5  Ankle inversion    Ankle eversion     (Blank rows = not tested)  LUMBAR SPECIAL TESTS:  Straight leg raise test: Negative, Slump test: Negative, and FABER test: Positive  FADDIR: Negative bilat  FUNCTIONAL TESTS:  5 times sit to stand: 21.43 seconds   JOINT MOBILITY: Lumbar CPA's and UPA's hypomobile and painful L5-T12  TODAY'S TREATMENT:                                                                                                                               DATE: 02/05/23  There.ex:  Nu-Step seat 6, arms 8, Level 1. 5 min UE/LE use for gentle thoracolumbar mobility   Standing quad stretch: 3x10 seconds    3x30 seconds seated hamstring stretch wit VC's for ankle DF     STS with airex pad in seat: 3x6     Seated hip ER with RTB: 3x6     Seated hip adduction against green ball: 3x6, 5 second holds    PATIENT EDUCATION:  Education details: POC, prognosis, HEP (reps, sets, frequency) Person educated: Patient Education method: Explanation and Handouts Education comprehension: verbalized understanding and needs further education  HOME EXERCISE PROGRAM: Access Code: [email or web address] URL: [email or web address] Date: 02/05/2023 Prepared by: Ronnie Derby  Exercises - Sit to Stand Without Arm Support  - 1 x daily - 7 x weekly - 3 sets - 8 reps - Seated Hamstring Stretch  - 1 x daily - 7 x weekly - 1 sets -  3 reps - 10 hold - Seated Gluteal Sets  - 1 x daily - 7 x weekly - 3 sets - 10 reps - 5 hold - Standing Quadriceps Stretch  - 1 x daily - 7 x weekly - 1 sets - 3 reps - 10 hold  Access Code: [email or web address] URL: [email or web address] Date: 01/22/2023 Prepared by: Ronnie Derby  Exercises - Sit to Stand Without Arm Support  - 1 x daily - 7 x weekly - 3 sets - 8 reps - Supine Gluteal Sets  - 1 x daily - 7 x weekly - 3 sets - 10 reps - 5 hold - Seated Hamstring Stretch  - 1 x daily - 7 x weekly - 1 sets - 3 reps - 10 hold - Prone Lying with Towel Roll (Hip Flexor Stretch) (BKA)  - 1 x daily - 7 x weekly - 1 sets - 3 reps - 10 hold  Access Code: [email or web address] URL: [email or web address] Date: 01/16/2023 Prepared by: Ronnie Derby  Exercises - Sit to Stand Without Arm Support  - 1 x daily - 7 x weekly - 3 sets - 8 reps - Supine Gluteal Sets  - 1 x daily - 7 x weekly - 3 sets - 10 reps - 5 hold - Supine Double Knee to Chest  - 1 x daily - 7 x  weekly - 2 sets - 8 reps - 10 hold  ASSESSMENT:  CLINICAL IMPRESSION: Pt late to session limiting treatment time. Adapted HEP and exercise today in sitting as pt had severe worsening of symptoms with supine and prone exercises last session. Pt with subjective reports of improved response already today compared to last session. Deferred 6 MWT to next session as pt was late to session. Pt will benefit from skilled PT services to address these impairments and maximize functional mobility to improve QoL.  OBJECTIVE IMPAIRMENTS: decreased mobility, difficulty walking, decreased ROM, decreased strength, hypomobility, impaired flexibility, and pain.   ACTIVITY LIMITATIONS: lifting, bending, sitting, standing, squatting, and sleeping  PARTICIPATION LIMITATIONS: cleaning, laundry, community activity, occupation, and yard work  PERSONAL FACTORS: Age, Past/current experiences, Time since onset of injury/illness/exacerbation, and 3+ comorbidities: DM type 2, HTN, HLD, OA, peripheral polyneuropathy   are also affecting patient's functional outcome.   REHAB POTENTIAL: Poor Chronicity of symptoms, PMH, smoking history  CLINICAL DECISION MAKING: Evolving/moderate complexity  EVALUATION COMPLEXITY: Moderate   GOALS: Goals reviewed with patient? No  SHORT TERM GOALS: Target date: 02/13/23  Pt will be independent with HEP to improve pain, Lumbar AROM, and LE strength to maximize functional capacity.  Baseline: 01/16/23: given HEP Goal status: INITIAL  LONG TERM GOALS: Target date: 03/13/23  Pt will improve FOTO to target score to demonstrate clinically significant improvement in functional mobility. Baseline: 01/16/23: deferred to next session Goal status: INITIAL  2.  Pt will improve 5xSTS to 12 seconds or less to demonstrate clinically significant improvement in LE strength to assist in tolerance for standing and walking ADL's Baseline: 01/16/23: 21.43 seconds Goal status: INITIAL  3.  Pt will  improve 6 MWT by 165' to demonstrate clinically significant improvement in community walking distances. Baseline: 01/16/23: deferred to next session Goal status: INITIAL  4.  Pt will report worst pain as a 6/10 NPS with functional tasks to demonstrate clinically significant improvement in functional mobility  Baseline: 01/16/23: worst pain 9/10 NPS Goal status: INITIAL   PLAN:  PT FREQUENCY: 1-2x/week  PT DURATION: 8 weeks  PLANNED INTERVENTIONS: Therapeutic exercises, Therapeutic activity, Neuromuscular re-education, Balance training,  Gait training, Patient/Family education, Self Care, Joint mobilization, Joint manipulation, Aquatic Therapy, Dry Needling, Electrical stimulation, Spinal manipulation, Spinal mobilization, Cryotherapy, Moist heat, Manual therapy, and Re-evaluation.  PLAN FOR NEXT SESSION: 6 MWT, gentle mobility of hips and lower back  Sumiko Ceasar M. Fairly IV, PT, DPT Physical Therapist- Man  [facility]  02/05/2023, 12:01 PM

## 2023-02-07 ENCOUNTER — Ambulatory Visit: Payer: MEDICAID

## 2023-02-09 ENCOUNTER — Other Ambulatory Visit: Payer: Self-pay | Admitting: Physician Assistant

## 2023-02-09 DIAGNOSIS — I10 Essential (primary) hypertension: Secondary | ICD-10-CM

## 2023-02-09 DIAGNOSIS — E7800 Pure hypercholesterolemia, unspecified: Secondary | ICD-10-CM

## 2023-02-10 ENCOUNTER — Emergency Department: Payer: MEDICAID

## 2023-02-10 ENCOUNTER — Emergency Department
Admission: EM | Admit: 2023-02-10 | Discharge: 2023-02-10 | Disposition: A | Discharge status: Home / Self Care | Payer: MEDICAID | Attending: Emergency Medicine | Admitting: Emergency Medicine

## 2023-02-10 ENCOUNTER — Other Ambulatory Visit: Payer: Self-pay

## 2023-02-10 DIAGNOSIS — X501XXA Overexertion from prolonged static or awkward postures, initial encounter: Secondary | ICD-10-CM | POA: Diagnosis not present

## 2023-02-10 DIAGNOSIS — J45909 Unspecified asthma, uncomplicated: Secondary | ICD-10-CM | POA: Insufficient documentation

## 2023-02-10 DIAGNOSIS — S82832A Other fracture of upper and lower end of left fibula, initial encounter for closed fracture: Secondary | ICD-10-CM | POA: Diagnosis not present

## 2023-02-10 DIAGNOSIS — S92355A Nondisplaced fracture of fifth metatarsal bone, left foot, initial encounter for closed fracture: Secondary | ICD-10-CM

## 2023-02-10 DIAGNOSIS — E119 Type 2 diabetes mellitus without complications: Secondary | ICD-10-CM | POA: Diagnosis not present

## 2023-02-10 DIAGNOSIS — T63441A Toxic effect of venom of bees, accidental (unintentional), initial encounter: Secondary | ICD-10-CM | POA: Diagnosis not present

## 2023-02-10 DIAGNOSIS — S99922A Unspecified injury of left foot, initial encounter: Secondary | ICD-10-CM | POA: Diagnosis present

## 2023-02-10 MED ORDER — OXYCODONE-ACETAMINOPHEN 5-325 MG PO TABS: 1.0000 | ORAL_TABLET | ORAL | 0 refills | Status: DC | PRN

## 2023-02-10 MED ORDER — OXYCODONE-ACETAMINOPHEN 5-325 MG PO TABS
1.0000 | ORAL_TABLET | Freq: Once | ORAL | Status: AC
  Administered 2023-02-10: 1 via ORAL
  Filled 2023-02-10: qty 1

## 2023-02-10 NOTE — ED Provider Notes (Signed)
[facility] Provider Note    Event Date/Time   First MD Initiated Contact with Patient 02/10/23 1717     (approximate)   History   Ankle Pain (Left)   HPI  [name] is a 47 y.o. female with history of diabetes, asthma, hyperlipidemia, osteoarthritis presents to the emergency department with left ankle and foot pain.  Patient states that she was outside MBs started to sting the right ankle and she twisted and hurt her ankle and foot.  No numbness or tingling.  States painful to bear weight.      Physical Exam   Triage Vital Signs: ED Triage Vitals  Encounter Vitals Group     BP 02/10/23 1655 (!) 170/90     Systolic BP Percentile --      Diastolic BP Percentile --      Pulse Rate 02/10/23 1653 (!) 111     Resp 02/10/23 1653 18     Temp 02/10/23 1653 98 F (36.7 C)     Temp Source 02/10/23 1653 Oral     SpO2 02/10/23 1653 99 %     Weight --      Height 02/10/23 1654 5\' 4"  (1.626 m)     Head Circumference --      Peak Flow --      Pain Score 02/10/23 1653 10     Pain Loc --      Pain Education --      Exclude from Growth Chart --     Most recent vital signs: Vitals:   02/10/23 1655 02/10/23 1914  BP: (!) 170/90 (!) 164/91  Pulse:  93  Resp:  18  Temp:    SpO2:  97%     General: Awake, no distress.   CV:  Good peripheral perfusion. regular rate and  rhythm Resp:  Normal effort.  Abd:  No distention.   Other:  Left ankle is swollen at the lateral malleolus, left foot is tender along the fifth metatarsal, neurovascular is intact, decreased range of motion secondary discomfort, bee stings have minor sting areas.  Localized reactions only.   ED Results / Procedures / Treatments   Labs (all labs ordered are listed, but only abnormal results are displayed) Labs Reviewed - No data to display   EKG     RADIOLOGY X-ray of the left ankle and left foot    PROCEDURES:   .Ortho Injury Treatment  Date/Time: 02/10/2023  8:17 PM  Performed by: Faythe Ghee, PA-C Authorized by: Faythe Ghee, PA-C   Consent:    Consent obtained:  Verbal   Consent given by:  Patient   Risks discussed:  Fracture, nerve damage, restricted joint movement, vascular damage, irreducible dislocation and stiffness   Alternatives discussed:  Alternative treatmentInjury location: ankle Location details: left ankle Injury type: fracture Fracture type: lateral malleolus Pre-procedure neurovascular assessment: neurovascularly intact Pre-procedure distal perfusion: normal Pre-procedure neurological function: normal Pre-procedure range of motion: normal  Anesthesia: Local anesthesia used: no  Patient sedated: NoManipulation performed: no Immobilization: splint Splint type: short leg Splint Applied by: ED Nurse and ED Provider Supplies used: cotton padding, elastic bandage and Ortho-Glass Post-procedure neurovascular assessment: post-procedure neurovascularly intact Post-procedure distal perfusion: normal Post-procedure neurological function: normal Post-procedure range of motion: normal Comments: Nursing staff to put a splint on.  Patient is complaining that it does not feel right.  Removed all of the padding and Ace wrap's.  Reapplied the splint.  Patient appears to feel more  comfortable.        MEDICATIONS ORDERED IN ED: Medications  oxyCODONE-acetaminophen (PERCOCET/ROXICET) 5-325 MG per tablet 1 tablet (1 tablet Oral Given 02/10/23 1756)     IMPRESSION / MDM / ASSESSMENT AND PLAN / ED COURSE  I reviewed the triage vital signs and the nursing notes.                              Differential diagnosis includes, but is not limited to, fracture, sprain, contusion, bee sting  Patient's presentation is most consistent with acute complicated illness / injury requiring diagnostic workup.   X-ray of the left ankle independently reviewed interpreted by me as having a fracture of the lateral malleolus  nondisplaced  X-ray of the left foot shows 1/5 metatarsal fracture which is nondisplaced, independently reviewed and interpreted by me  See splint procedure  Patient was given Percocet for pain  Patient is to elevate and ice her ankle.  Follow-up with Dr. Audelia Acton.  She was given a prescription for Percocet for pain.  I explained to her would be best to save this medication for at night when it is difficult to sleep due to the pain.  During the day she could alternate Tylenol and ibuprofen.  She is in agreement treatment plan.  Discharged stable condition.  Bee sti sting appears to be a localized reaction only.  She can use hydrocortisone cream.      FINAL CLINICAL IMPRESSION(S) / ED DIAGNOSES   Final diagnoses:  Closed fracture of distal end of left fibula, unspecified fracture morphology, initial encounter  Nondisplaced fracture of fifth metatarsal bone, left foot, initial encounter for closed fracture  Bee sting, accidental or unintentional, initial encounter     Rx / DC Orders   ED Discharge Orders          Ordered    oxyCODONE-acetaminophen (PERCOCET) 5-325 MG tablet  Every 4 hours PRN        02/10/23 1758             Note:  This document was prepared using Dragon voice recognition software and may include unintentional dictation errors.    Faythe Ghee, PA-C 02/10/23 2026    Phineas Semen, MD 02/11/23 [phone]

## 2023-02-10 NOTE — ED Notes (Signed)
Splint reviewed by Sherrie Mustache, PA

## 2023-02-10 NOTE — ED Triage Notes (Signed)
Pt to ed from home via POV for ankle pain. Pt was outside and had been stung by several bees and she fell and injured her left ankle. Pt advised they were wasps. Pt has no airway involvement or signs of allergic reaction. Ankle injury is issue today. Pt is caox4, and in no acute distress.

## 2023-02-10 NOTE — ED Notes (Signed)
Pt verbalizes understanding of discharge instructions. Opportunity for questioning and answers were provided. Pt discharged from ED to home with mother.   ? ?

## 2023-02-10 NOTE — Discharge Instructions (Signed)
Elevate and ice the ankle.  Follow-up with Dr. Dionisio Paschal office.  Please call for an appointment.  You may take ibuprofen, 3 over-the-counter pills 3 times daily with food.  You may also take Percocet as needed for pain.

## 2023-02-12 ENCOUNTER — Ambulatory Visit: Payer: MEDICAID

## 2023-02-14 ENCOUNTER — Other Ambulatory Visit: Payer: Self-pay | Admitting: Nurse Practitioner

## 2023-02-14 ENCOUNTER — Telehealth: Payer: Self-pay

## 2023-02-14 ENCOUNTER — Ambulatory Visit: Payer: MEDICAID

## 2023-02-14 NOTE — Telephone Encounter (Signed)
Called and spoke with pt about missed visit.  Pt did not come due to not receiving clearance from MD/PA to return to therapy following the fall/fx of the ankle.  Pt advised to contact MD again for clearance before returning.  Pt's POC ends 8/27 and was advised that she could be seen prior to that time.  Pt understanding and will call back with response from MD.  Nolon Bussing, PT, DPT Physical Therapist - [facility]  02/14/23, 3:20 PM

## 2023-02-15 ENCOUNTER — Other Ambulatory Visit: Payer: Self-pay | Admitting: Family Medicine

## 2023-02-15 ENCOUNTER — Telehealth: Payer: Self-pay

## 2023-02-15 DIAGNOSIS — E1142 Type 2 diabetes mellitus with diabetic polyneuropathy: Secondary | ICD-10-CM

## 2023-02-15 NOTE — Transitions of Care (Post Inpatient/ED Visit) (Signed)
Pt was seen at ED on 02/10/23 and pt was being chased by bees and pt fell and fx lt 5th metacarpal of lt foot and lt fibula. Pt was seen on 02/13/23 by [facility] and received cast and pt still using crutches. Pt is waiting for insurance approval for CT scan and then ortho FU will be scheduled. Pt is feeling better since seen in Ed. Sending note to Dr Clent Ridges as Lorain Childes.    02/15/2023  Name: PARILEE NYHUS MRN: [id number] DOB: 10-05-1975  Today's TOC FU Call Status: Today's TOC FU Call Status:: Successful TOC FU Call Completed TOC FU Call Complete Date: 02/15/23  Transition Care Management Follow-up Telephone Call Date of Discharge: 02/10/23 Discharge Facility: [facility]) Type of Discharge: Emergency Department Reason for ED Visit: Orthopedic Conditions Orthopedic/Injury Diagnosis: Fracture (pt fell when running getting away from bees and fx lt 5th metacarpal lt foot and Lt fibula.) How have you been since you were released from the hospital?: Better (taking pain med that is controlling pain.) Any questions or concerns?: Yes Patient Questions/Concerns:: pt wanted to ck on refill for gabapentin 300mg . last refilled # 270 x1 on 09/07/22 taking tid prn and pt said takng qid prn most of the time. Ms Earlene Plater at [facility] is going to request refill of  med from pcP. Patient Questions/Concerns Addressed: Other: ([facility] will request refill for pt.)  Items Reviewed: Did you receive and understand the discharge instructions provided?: Yes Medications obtained,verified, and reconciled?: Yes (Medications Reviewed) Any new allergies since your discharge?: No Dietary orders reviewed?: NA Do you have support at home?: Yes People in Home: parent(s) Name of Support/Comfort Primary Source: Irving Burton  Medications Reviewed Today: Medications Reviewed Today   Medications were not reviewed in this encounter     Home Care and Equipment/Supplies: Were Home Health Services  Ordered?: NA Any new equipment or medical supplies ordered?: NA  Functional Questionnaire: Do you need assistance with bathing/showering or dressing?: No Do you need assistance with meal preparation?: Yes (pts mom helping) Do you need assistance with eating?: No Do you have difficulty maintaining continence: No Do you need assistance with getting out of bed/getting out of a chair/moving?: No Do you have difficulty managing or taking your medications?: No  Follow up appointments reviewed: PCP Follow-up appointment confirmed?: NA Specialist Hospital Follow-up appointment confirmed?: Yes Date of Specialist follow-up appointment?: 02/13/23 (pt seen [facility] on 02/13/23 and pt received cast and continue to use crutches.) Follow-Up Specialty Provider:: [facility] for CT scan and FU visit to be scheduled Do you need transportation to your follow-up appointment?: No Do you understand care options if your condition(s) worsen?: Yes-patient verbalized understanding    SIGNATURE Lewanda Rife, LPN

## 2023-02-20 ENCOUNTER — Other Ambulatory Visit: Payer: Self-pay | Admitting: Nurse Practitioner

## 2023-02-21 ENCOUNTER — Other Ambulatory Visit: Payer: Self-pay | Admitting: Orthopedic Surgery

## 2023-02-21 DIAGNOSIS — S92353A Displaced fracture of fifth metatarsal bone, unspecified foot, initial encounter for closed fracture: Secondary | ICD-10-CM | POA: Insufficient documentation

## 2023-02-21 DIAGNOSIS — S8262XA Displaced fracture of lateral malleolus of left fibula, initial encounter for closed fracture: Secondary | ICD-10-CM

## 2023-02-21 HISTORY — DX: Displaced fracture of fifth metatarsal bone, unspecified foot, initial encounter for closed fracture: S92.353A

## 2023-02-23 ENCOUNTER — Ambulatory Visit
Admission: RE | Admit: 2023-02-23 | Discharge: 2023-02-23 | Disposition: A | Discharge status: Home / Self Care | Payer: MEDICAID | Source: Ambulatory Visit | Attending: Orthopedic Surgery | Admitting: Orthopedic Surgery

## 2023-02-23 DIAGNOSIS — S8262XA Displaced fracture of lateral malleolus of left fibula, initial encounter for closed fracture: Secondary | ICD-10-CM | POA: Insufficient documentation

## 2023-02-28 ENCOUNTER — Other Ambulatory Visit: Payer: Self-pay | Admitting: Nurse Practitioner

## 2023-03-07 ENCOUNTER — Ambulatory Visit: Payer: MEDICAID | Attending: Orthopedic Surgery

## 2023-03-07 DIAGNOSIS — S8263XA Displaced fracture of lateral malleolus of unspecified fibula, initial encounter for closed fracture: Secondary | ICD-10-CM | POA: Insufficient documentation

## 2023-03-07 HISTORY — DX: Displaced fracture of lateral malleolus of unspecified fibula, initial encounter for closed fracture: S82.63XA

## 2023-03-13 DIAGNOSIS — M4310 Spondylolisthesis, site unspecified: Secondary | ICD-10-CM | POA: Insufficient documentation

## 2023-03-15 ENCOUNTER — Ambulatory Visit: Payer: MEDICAID | Attending: Orthopedic Surgery

## 2023-03-15 DIAGNOSIS — M6281 Muscle weakness (generalized): Secondary | ICD-10-CM | POA: Diagnosis present

## 2023-03-15 DIAGNOSIS — M5459 Other low back pain: Secondary | ICD-10-CM | POA: Insufficient documentation

## 2023-03-15 DIAGNOSIS — R262 Difficulty in walking, not elsewhere classified: Secondary | ICD-10-CM | POA: Insufficient documentation

## 2023-03-15 NOTE — Therapy (Signed)
OUTPATIENT PHYSICAL THERAPY THORACOLUMBAR RECERT/TREATMENT   Patient Name: [name] MRN: [id number] DOB:02-14-1976, 47 y.o., female Today's Date: 03/16/2023  END OF SESSION:  PT End of Session - 03/15/23 1554     Visit Number 4    Number of Visits 17    Date for PT Re-Evaluation 04/26/23    PT Start Time 1600    PT Stop Time 1642    PT Time Calculation (min) 42 min    Activity Tolerance Patient tolerated treatment well    Behavior During Therapy [facility] for tasks assessed/performed             Past Medical History:  Diagnosis Date   Absence of bladder continence 04/01/2010   Overview:   Urinary Loss Of Control   Acid reflux 04/01/2010   Overview:    Acute gastroenteritis 03/14/2022   Anxiety state, unspecified 03/26/2009   Arthralgia 08/14/2018   ASTHMA 02/02/2007   Asthma    Chest wall pain 08/02/2018   DEPRESSION 02/16/2009   DIABETES MELLITUS, TYPE I 02/02/2007   Dizziness 09/18/2018   Eczema 08/02/2018   Elevated cholesterol 06/02/2022   Encounter to establish care 06/02/2022   Endometriosis    Essential hypertension    GERD 12/21/2008   Herpes    at 18   Hyperlipidemia    Hypertension 03/03/2020   Lesion of vocal cord    Mild intermittent asthma without complication 08/12/2018   Osteoarthritis    Peripheral polyneuropathy 11/29/2017   Primary osteoarthritis of both feet 11/30/2019   Primary osteoarthritis of both hands 11/30/2019   Bilateral mild osteoarthritis.   Retinal hemorrhage of right eye 11/04/2019   Severe nonproliferative diabetic retinopathy of right eye, without macular edema, associated with type 1 diabetes mellitus (HCC) 11/04/2019   Shortness of breath 09/25/2007   Uncontrolled type 1 diabetes mellitus with hyperglycemia, with long-term current use of insulin (HCC) 03/14/2022          Uncontrolled type 2 diabetes mellitus with hyperglycemia, with long-term current use of insulin (HCC) 03/14/2022   Vitreous hemorrhage of right  eye (HCC) 08/16/2021   Vocal fold leukoplakia    Past Surgical History:  Procedure Laterality Date   ABDOMINAL HYSTERECTOMY  2009   BSO   DIRECT LARYNGOSCOPY N/A 08/17/2017   Procedure: DIRECT LARYNGOSCOPY WITH OPERATING TELESCOPE WITH BIOPSY;  Surgeon: Graylin Shiver, MD;  Location: [facility] OR;  Service: ENT;  Laterality: N/A;   NASOPHARYNGEAL BIOPSY Right 08/17/2017   Procedure: NASOPHARYNGEAL BIOPSY RIGHT NASAL LESION;  Surgeon: Graylin Shiver, MD;  Location: [facility] OR;  Service: ENT;  Laterality: Right;   Patient Active Problem List   Diagnosis Date Noted   Emphysema lung (HCC) 08/19/2022   Gastroparesis due to DM (HCC) 08/19/2022   Benzodiazepine dependence (HCC) 08/19/2022   Hypertension associated with diabetes (HCC) 08/19/2022   Cough 08/19/2022   Aortic atherosclerosis (HCC) 08/19/2022   Diabetes mellitus type I (HCC) 06/02/2022   Hyponatremia 03/14/2022   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 associated with type 1 diabetes mellitus (HCC) 08/16/2021   Primary osteoarthritis of both knees 11/30/2019   Severe nonproliferative diabetic retinopathy of right eye, without macular edema, associated with type 1 diabetes mellitus (HCC) 11/04/2019   Severe nonproliferative diabetic retinopathy of left eye, without macular edema, associated with type 1 diabetes mellitus (HCC) 11/04/2019   Chronic pain syndrome 11/13/2018   Snoring 08/12/2018   Breast lump 08/12/2018   Lung nodule 08/12/2018   Diabetic peripheral neuropathy (HCC) 01/11/2018  Low back pain with left-sided sciatica 11/29/2017   Multinodular goiter 07/29/2015   Restless leg 11/07/2013   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   Cholesteatoma 09/11/2011   Allergy to environmental factors 09/11/2011   Anxiety 05/01/2011   INSOMNIA 02/18/2010   GERD 12/21/2008   SMOKER 09/25/2007    PCP: Dana Allan MD  REFERRING PROVIDER: Drake Leach PA-C  REFERRING DIAG: [id number] (ICD-10-CM) -  Spondylolisthesis of lumbar region M51.36 (ICD-10-CM) - DDD (degenerative disc disease), lumbar M54.16 (ICD-10-CM) - Lumbar radiculopathy G62.9 (ICD-10-CM) - Neuropathy  Rationale for Evaluation and Treatment: Rehabilitation  THERAPY DIAG:  Other low back pain  Muscle weakness (generalized)  Difficulty in walking, not elsewhere classified  ONSET DATE: ~ 9-10 years ago   SUBJECTIVE:                                                                                                                                                                                           SUBJECTIVE STATEMENT: Pt reports LBP at a 6/10 NPS. Pt reports L ankle fracture on 02/10/23 wearing CAM boot. Pain meds for her ankle has been helpful for her LBP minimally. Has not been very compliant with her HEP since hurting her ankle. Still interested in possible Aquatic PT referral if she does not tolerate land based PT well.   PERTINENT HISTORY:  Pt is a 47 y.o. female referred to OPPT for chronic LBP. Pain began ~10 years ago. Pain is upper lower back that refers in dermatomal pattern from buttocks then wrap around to anterior thighs. Pain has increased in severity in the past year where pt reports "I can't do anything anymore". Pain described as tightness, achey. LBP is sharp, dull, and achey. Reports her back feels like a "brick" or "block". Aggravating factors, include laying down on her back, standing, walking, bending over. Reports she has a hard time standing long enough to wash her dishes. Can complete short, community errands. Worst pain described as a 9/10 NPS. Best pain 3-4/10 NPS. Currently 6/10 NPS. Pain improved with rest, hook lying, and medications.  PAIN:  Are you having pain? Yes: NPRS scale: 6/10 Pain location: Lower back paraspinals Pain description: achey, sharp Aggravating factors: bending over, walking, standing, sleeping Relieving factors: medication  PRECAUTIONS: None  WEIGHT BEARING  RESTRICTIONS: No  FALLS:  Has patient fallen in last 6 months? 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 along with severe DDD and bilateral moderate/severe foraminal  stenosis  PATIENT SURVEYS:  FOTO Deferred to next session  SCREENING FOR RED FLAGS: Bowel or bladder incontinence: No Spinal tumors: No Cauda equina syndrome: No Compression fracture: No Abdominal aneurysm: No  COGNITION: Overall cognitive status: Within functional limits for tasks assessed     SENSATION: Light touch: WFL  MUSCLE LENGTH: Hamstrings SLR: Right  10 deg; Left 12  deg Thomas test: Right 15 deg; Left 15 deg  POSTURE:  rounded shoulders, forward head, and decreased lumbar lordosis L hip hike in standing   PALPATION: TTP along B upper glut meds, B paraspinals from S1 to T8 (bra strap)   LUMBAR ROM:   AROM eval  Flexion 50%*  Extension 25%*  Right lateral flexion 50%*  Left lateral flexion 50%*  Right rotation 75%*  Left rotation 75%*   (Blank rows = not tested)  LOWER EXTREMITY ROM:     Active  Right eval Left eval  Hip flexion    Hip extension    Hip abduction    Hip adduction    Hip internal rotation    Hip external rotation    Knee flexion    Knee extension    Ankle dorsiflexion    Ankle plantarflexion    Ankle inversion    Ankle eversion     (Blank rows = not tested)  LOWER EXTREMITY MMT:    MMT Right eval Left eval  Hip flexion 3+ 3+  Hip extension    Hip abduction 4 4  Hip adduction    Hip internal rotation 4 4  Hip external rotation 4 4  Knee flexion 4 4  Knee extension 5 4  Ankle dorsiflexion 5 5  Ankle plantarflexion 5 5  Ankle inversion    Ankle eversion     (Blank rows = not tested)  LUMBAR SPECIAL TESTS:  Straight leg raise test: Negative, Slump test: Negative, and FABER test:  Positive  FADDIR: Negative bilat  FUNCTIONAL TESTS:  5 times sit to stand: 21.43 seconds   JOINT MOBILITY: Lumbar CPA's and UPA's hypomobile and painful L5-T12  TODAY'S TREATMENT:                                                                                                                              DATE: 03/15/23  There.ex:  Reviewed goals as pt is past due her POC and PT certification. See goals section below for details.  2 MWT: see goals section  Seated hamstring stretch: 2x20 sec/LE. Min VC's for hamstring   Seated blue physioball roll outs forwards: x12 in pain free ranges  Seated GTB anti rotation paloff press: 2x12/side Seated scap retractions with GTB: 2x12  Updated HEP and provided new print out. Eliminating STS and standing quad stretch to accommodate WB precautions on LLE in CAM boot. Reviewed safe theraband use at home setting and reviewed new exercises (reps/sets/frequency).   PATIENT EDUCATION:  Education details: POC, prognosis, HEP (reps, sets, frequency) Person educated: Patient Education  method: Explanation and Handouts Education comprehension: verbalized understanding and needs further education  HOME EXERCISE PROGRAM: Access Code: [email or web address] URL: [email or web address] Date: 03/15/2023 Prepared by: Ronnie Derby  Exercises - Seated Gluteal Sets  - 1 x daily - 5 x weekly - 3 sets - 10 reps - 5 hold - Scapular Retraction with Resistance  - 1 x daily - 5 x weekly - 2 sets - 12 reps - Seated Anti-Rotation Press With Anchored Resistance  - 1 x daily - 5 x weekly - 2 sets - 12 reps - Seated Hamstring Stretch with Strap  - 7 x weekly - 1 sets - 2-3 reps - 30 hold   Access Code: [email or web address] URL: [email or web address] Date: 02/05/2023 Prepared by: Ronnie Derby  Exercises - Sit to Stand Without Arm Support  - 1 x daily - 7 x weekly - 3 sets - 8 reps - Seated Hamstring Stretch  - 1 x daily - 7 x weekly - 1 sets - 3 reps - 10 hold -  Seated Gluteal Sets  - 1 x daily - 7 x weekly - 3 sets - 10 reps - 5 hold - Standing Quadriceps Stretch  - 1 x daily - 7 x weekly - 1 sets - 3 reps - 10 hold  Access Code: [email or web address] URL: [email or web address] Date: 01/22/2023 Prepared by: Ronnie Derby  Exercises - Sit to Stand Without Arm Support  - 1 x daily - 7 x weekly - 3 sets - 8 reps - Supine Gluteal Sets  - 1 x daily - 7 x weekly - 3 sets - 10 reps - 5 hold - Seated Hamstring Stretch  - 1 x daily - 7 x weekly - 1 sets - 3 reps - 10 hold - Prone Lying with Towel Roll (Hip Flexor Stretch) (BKA)  - 1 x daily - 7 x weekly - 1 sets - 3 reps - 10 hold  Access Code: [email or web address] URL: [email or web address] Date: 01/16/2023 Prepared by: Ronnie Derby  Exercises - Sit to Stand Without Arm Support  - 1 x daily - 7 x weekly - 3 sets - 8 reps - Supine Gluteal Sets  - 1 x daily - 7 x weekly - 3 sets - 10 reps - 5 hold - Supine Double Knee to Chest  - 1 x daily - 7 x weekly - 2 sets - 8 reps - 10 hold  ASSESSMENT:  CLINICAL IMPRESSION: Pt unfortunately has had scheduling barriers and other medical issues limiting PT compliance during her prior POC. Focus of session today on assessing goals and updating POC. Unable to assess 6 MWT due to fibular fracture and in walking boot to limit weightbearing. Did complete so will plan for as f/u happens for removal of L ankle CAM boot. Reviewed remaining goals and updated POC and HEP. Pt has made some gains in goals with regards to her FOTO score indicating improved function however this could be due to pain meds for her ankle fracture but otherwise generally unchanged with her condition.Updating POC to attempt 1-2x/week for 6 weeks to address LBP but also educated pt on possible aquatic PT referral if pt has poor tolerance to land based PT. Pt will benefit from skilled PT services to address these impairments and maximize functional mobility to improve QoL.  OBJECTIVE  IMPAIRMENTS: decreased mobility, difficulty walking, decreased ROM, decreased strength, hypomobility, impaired flexibility, and pain.   ACTIVITY LIMITATIONS: lifting, bending, sitting, standing, squatting, and sleeping  PARTICIPATION LIMITATIONS: cleaning, laundry, community activity, occupation, and yard  work  PERSONAL FACTORS: Age, Past/current experiences, Time since onset of injury/illness/exacerbation, and 3+ comorbidities: DM type 2, HTN, HLD, OA, peripheral polyneuropathy   are also affecting patient's functional outcome.   REHAB POTENTIAL: Poor Chronicity of symptoms, PMH, smoking history  CLINICAL DECISION MAKING: Evolving/moderate complexity  EVALUATION COMPLEXITY: Moderate   GOALS: Goals reviewed with patient? No  SHORT TERM GOALS: Target date: 04/05/23  Pt will be independent with HEP to improve pain, Lumbar AROM, and LE strength to maximize functional capacity.  Baseline: 01/16/23: given HEP; 03/15/23: Not compliant since ankle fracture on 02/10/23 Goal status: INITIAL  LONG TERM GOALS: Target date: 04/26/23  Pt will improve FOTO to target score to demonstrate clinically significant improvement in functional mobility. Baseline: 01/16/23: deferred to next session; 01/22/23: 19/36; 03/15/23: 36/36 Goal status: MET  2.  Pt will improve 5xSTS to 12 seconds or less to demonstrate clinically significant improvement in LE strength to assist in tolerance for standing and walking ADL's Baseline: 01/16/23: 21.43 seconds; 03/15/23: deferred due to next session Goal status: INITIAL  3.  Pt will improve 6 MWT by 165' to demonstrate clinically significant improvement in community walking distances. Baseline: 01/16/23: deferred to next session; 03/15/23: deferred. Pt with fractured fibula in walking boot. Completed 2 MWT: 330' up to 8/10 NPS.  Goal status: INITIAL  4.  Pt will report worst pain as a 6/10 NPS with functional tasks to demonstrate clinically significant improvement in functional  mobility  Baseline: 01/16/23: worst pain 9/10 NPS; 03/15/23: 9-10/10 NPS.  Goal status: INITIAL   PLAN:  PT FREQUENCY: 1-2x/week  PT DURATION: 6 weeks  PLANNED INTERVENTIONS: Therapeutic exercises, Therapeutic activity, Neuromuscular re-education, Balance training, Gait training, Patient/Family education, Self Care, Joint mobilization, Joint manipulation, Aquatic Therapy, Dry Needling, Electrical stimulation, Spinal manipulation, Spinal mobilization, Cryotherapy, Moist heat, Manual therapy, and Re-evaluation.  PLAN FOR NEXT SESSION: gentle seated core stability exercises  Delphia Grates. Fairly IV, PT, DPT Physical Therapist- [place]  [facility]  03/16/2023, 9:53 AM

## 2023-03-16 NOTE — Progress Notes (Deleted)
Referring Physician:  Dana Allan, MD [address] [place],  [place] [address]  Primary Physician:  Dana Allan, MD  History of Present Illness: 03/16/2023 [name] has a history of HTN, DM, asthma/COPD, emphysema, GERD, goiter, DM peripheral neuropathy, chronic pain syndrome.    Last seen by me for phone visit on 01/29/23.   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 LBP likely due to L5-S1. Thigh pain may be lumbar medidated. Numbness/tingling from knees down is likely due to her neuropathy.   She was to continue with PT. We discussed she may need to get updated EMG, DEXA scan, and lumbar flexion/extension xrays prior to any surgical consideration. Smoking cessation discussed and encouraged. HgBA1c would need to be less than 7.5 prior to any surgery. It was 9.4 on 01/09/23.   She was referred to [facility] management ([facility])- [facility] did not take over her pain medications.   She is here for follow up. ***  She had left ankle fracture on 02/10/23- is wearing a boot.***     She has chronic constant LBP with bilateral thigh (entire leg) pain to her knees x 5-6 years. LBP > leg pain. She has numbness and tingling in posterior calves to her feet. Pain is worse with bending and prolonged standing. She notes weakness in both legs. She has OA in both knees and they told her to get back fixed prior to TKAs.   Last HgbA1c on 01/09/23 was 9.4. She is working on this.    Bowel/Bladder Dysfunction: none   She smokes 1 ppd x 20 years. She is working on quitting and is at 1/2 to 1 ppd.    Conservative measures:  Physical therapy: initial eval on 01/16/23 and had a visit on 01/22/23, 02/05/23, and 03/15/23 Multimodal medical therapy including regular antiinflammatories: voltaren, neurontin, ibuprofen, mobic, ultram, norco Injections: Unable to have epidural steroid injections due to elevation of blood sugars  post injection  Past Surgery: denies spine surgery  [name] has no symptoms of cervical myelopathy. She has intermittent dizziness.   The symptoms are causing a significant impact on the patient's life.   Review of Systems:  A 10 point review of systems is negative, except for the pertinent positives and negatives detailed in the HPI.  Past Medical History: Past Medical History:  Diagnosis Date   Absence of bladder continence 04/01/2010   Overview:   Urinary Loss Of Control   Acid reflux 04/01/2010   Overview:    Acute gastroenteritis 03/14/2022   Anxiety state, unspecified 03/26/2009   Arthralgia 08/14/2018   ASTHMA 02/02/2007   Asthma    Chest wall pain 08/02/2018   DEPRESSION 02/16/2009   DIABETES MELLITUS, TYPE I 02/02/2007   Dizziness 09/18/2018   Eczema 08/02/2018   Elevated cholesterol 06/02/2022   Encounter to establish care 06/02/2022   Endometriosis    Essential hypertension    GERD 12/21/2008   Herpes    at 18   Hyperlipidemia    Hypertension 03/03/2020   Lesion of vocal cord    Mild intermittent asthma without complication 08/12/2018   Osteoarthritis    Peripheral polyneuropathy 11/29/2017   Primary osteoarthritis of both feet 11/30/2019   Primary osteoarthritis of both hands 11/30/2019   Bilateral mild osteoarthritis.   Retinal hemorrhage of right eye 11/04/2019   Severe nonproliferative diabetic retinopathy of right eye, without macular edema, associated with type 1 diabetes mellitus (HCC)  11/04/2019   Shortness of breath 09/25/2007   Uncontrolled type 1 diabetes mellitus with hyperglycemia, with long-term current use of insulin (HCC) 03/14/2022          Uncontrolled type 2 diabetes mellitus with hyperglycemia, with long-term current use of insulin (HCC) 03/14/2022   Vitreous hemorrhage of right eye (HCC) 08/16/2021   Vocal fold leukoplakia     Past Surgical History: Past Surgical History:  Procedure Laterality Date   ABDOMINAL  HYSTERECTOMY  2009   BSO   DIRECT LARYNGOSCOPY N/A 08/17/2017   Procedure: DIRECT LARYNGOSCOPY WITH OPERATING TELESCOPE WITH BIOPSY;  Surgeon: Graylin Shiver, MD;  Location: [facility] OR;  Service: ENT;  Laterality: N/A;   NASOPHARYNGEAL BIOPSY Right 08/17/2017   Procedure: NASOPHARYNGEAL BIOPSY RIGHT NASAL LESION;  Surgeon: Graylin Shiver, MD;  Location: [facility] OR;  Service: ENT;  Laterality: Right;    Allergies: Allergies as of 03/20/2023   (No Known Allergies)    Medications: Outpatient Encounter Medications as of 03/20/2023  Medication Sig   Continuous Glucose Sensor (DEXCOM G7 SENSOR) MISC Change every 10 days   ALPRAZolam (XANAX) 0.5 MG tablet Take 0.5 mg by mouth 2 (two) times daily.   amLODipine (NORVASC) 10 MG tablet Take 1 tablet (10 mg total) by mouth daily.   atorvastatin (LIPITOR) 10 MG tablet Take 1 tablet (10 mg total) by mouth at bedtime.   Blood Glucose Monitoring Suppl (ACCU-CHEK GUIDE ME) w/Device KIT Use to check blood sugar daily   cetirizine (ZYRTEC) 10 MG tablet Take 1 tablet (10 mg total) by mouth daily. (Patient not taking: Reported on 01/09/2023)   Cholecalciferol (VITAMIN D3) 50 MCG (2000 UT) capsule Take 2,000 Units by mouth daily.   clonazePAM (KLONOPIN) 0.5 MG tablet Take 0.25 mg by mouth 2 (two) times daily as needed. (Patient not taking: Reported on 01/09/2023)   Continuous Glucose Sensor (DEXCOM G6 SENSOR) MISC 1 each by Does not apply route See admin instructions. Change sensor every 10 days; E10.9   Continuous Glucose Transmitter (DEXCOM G6 TRANSMITTER) MISC Use as instructed to check blood sugars, change every 3 months.   diclofenac (VOLTAREN) 75 MG EC tablet  (Patient not taking: Reported on 01/09/2023)   fluticasone (FLONASE) 50 MCG/ACT nasal spray Place 2 sprays into both nostrils daily. (Patient not taking: Reported on 01/09/2023)   gabapentin (NEURONTIN) 300 MG capsule TAKE 1 CAPSULE BY MOUTH 3 TIMES DAILY AS NEEDED   glucagon 1 MG injection Inject 1 mg  into the muscle once as needed.   HYDROcodone-acetaminophen (NORCO) 5-325 MG tablet Take 1 tablet by mouth every 8 (eight) hours as needed for moderate pain. (Patient not taking: Reported on 01/09/2023)   Ibuprofen (ADVIL PO) Take 2-3 tablets by mouth every 6 (six) hours as needed.   insulin aspart (NOVOLOG) 100 UNIT/ML injection INJECT 40 UNITS DAILY PER INSULIN PUMP (MAX DOSAGE)   Insulin Pen Needle (PEN NEEDLES) 32G X 5 MM MISC Use as instructed   ipratropium (ATROVENT HFA) 17 MCG/ACT inhaler Inhale 2 puffs into the lungs every 4 (four) hours as needed for wheezing.   methocarbamol (ROBAXIN) 500 MG tablet Take 1 tablet (500 mg total) by mouth every 8 (eight) hours as needed for muscle spasms. Be careful, this can make you sleepy. (Patient not taking: Reported on 01/09/2023)   Multiple Vitamin (MULTIVITAMIN WITH MINERALS) TABS tablet Take 1 tablet by mouth daily.   omeprazole (PRILOSEC) 40 MG capsule TAKE 1 CAPSULE BY MOUTH TWICE A DAY. PATIENT NEEDS FOLLOW UP APPOINTMENT FOR FUTURE  REFILLS. PLEASE CALL [phone] TO SCHEDULE AN APPOINTMENT.   ondansetron (ZOFRAN-ODT) 4 MG disintegrating tablet Take 1 tablet (4 mg total) by mouth every 8 (eight) hours as needed for nausea or vomiting.   oxyCODONE-acetaminophen (PERCOCET) 5-325 MG tablet Take 1 tablet by mouth every 4 (four) hours as needed for severe pain.   REXULTI 1 MG TABS tablet Take 1 mg by mouth daily.   rOPINIRole (REQUIP) 1 MG tablet Take 1 tablet (1 mg total) by mouth at bedtime. TAKE 1 TABLET BY MOUTH AT BEDTIME   sertraline (ZOLOFT) 25 MG tablet Take 50 mg by mouth daily.   Tiotropium Bromide Monohydrate (SPIRIVA RESPIMAT) 2.5 MCG/ACT AERS Inhale 2 puffs into the lungs daily at 2 PM.   traZODone (DESYREL) 100 MG tablet PLEASE SEE ATTACHED FOR DETAILED DIRECTIONS   VENTOLIN HFA 108 (90 Base) MCG/ACT inhaler  (Patient not taking: Reported on 01/09/2023)   No facility-administered encounter medications on file as of 03/20/2023.    Social  History: Social History   Tobacco Use   Smoking status: Every Day    Current packs/day: 1.50    Average packs/day: 1.5 packs/day for 31.0 years (46.5 ttl pk-yrs)    Types: Cigarettes   Smokeless tobacco: Never   Tobacco comments:    11/03/2021 -  states she smokes a pack and a half a day  Vaping Use   Vaping status: Former   Quit date: 02/23/2017   Devices: only used tobacco   Substance Use Topics   Alcohol use: Not Currently    Alcohol/week: 0.0 standard drinks of alcohol   Drug use: No    Family Medical History: Family History  Problem Relation Age of Onset   COPD Mother    Arthritis Mother    Diabetes Mother    COPD Father    Cancer Father    Arthritis Father    Diabetes Father    Diabetes type I Brother    Diabetes Maternal Aunt    Emphysema Maternal Uncle    Diabetes Maternal Grandfather    Colon cancer Neg Hx    Kidney disease Neg Hx    Liver disease Neg Hx    Esophageal cancer Neg Hx    Rectal cancer Neg Hx    Stomach cancer Neg Hx     Physical Examination: There were no vitals filed for this visit.  General: Patient is well developed, well nourished, calm, collected, and in no apparent distress. Attention to examination is appropriate.  Respiratory: Patient is breathing without any difficulty.   NEUROLOGICAL:     Awake, alert, oriented to person, place, and time.  Speech is clear and fluent. Fund of knowledge is appropriate.   Cranial Nerves: Pupils equal round and reactive to light.  Facial tone is symmetric.    Limited ROM of lumbar spine with pain on flexion Mild lower posterior lumbar tenderness.   No abnormal lesions on exposed skin.   Strength: Side Biceps Triceps Deltoid Interossei Grip Wrist Ext. Wrist Flex.  R 5 5 5 5 5 5 5   L 5 5 5 5 5 5 5    Side Iliopsoas Quads Hamstring PF DF EHL  R 5 5 5 5 5 5   L 5 5 5 5 5 5    Reflexes are 2+ and symmetric at the biceps, triceps, brachioradialis, and achilles. She requested I did not test DTR  at patella bilaterally due to knee pain.   Hoffman's is absent.  Clonus is not present.   Bilateral upper and  lower extremity sensation is intact to light touch, but diminished in bilateral legs from knees down.   She has a slight limping gait.   Medical Decision Making  Imaging: Nothing new.   Assessment and Plan: Ms. Speese is a pleasant 47 y.o. female has chronic constant LBP with bilateral thigh (entire leg) pain to her knees x 5-6 years. LBP > leg pain. She has numbness and tingling in posterior calves to her feet. Pain is worse with bending and prolonged standing.  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 LBP likely due to L5-S1. Thigh pain may be lumbar medidated. Numbness/tingling from knees down is likely due to her neuropathy.   Treatment options discussed with patient and following plan made:   - Order for physical therapy for lumbar spine to [facility]. Patient to call to schedule appointment.  - Unable to have ESIs as they elevate her blood sugars.  - Continue current medications including prn ultram (from ortho). Reviewed dosing and side effects.  - Continue neurontin from other providers. She will ask about possibly increasing it.  - Discussed she may need to get updated EMG, DEXA scan, and lumbar flexion/extension xrays prior to any surgical consideration.  - Smoking cessation discussed and encouraged.  - HgBA1c would need to be less than 7.5 prior to any surgery. It was 9.4 on 04/17/22 (this was right after her husband passed away).  - She was given 5 day supply of ultram on 4/15- I can send a one time refill of this to be filled on Saturday 4/20.  - Follow up with me in 6 weeks. Will let me know if she is discharged from PT.   I spent a total of 45 minutes in face-to-face and non-face-to-face activities related to this patient's care today including review of outside records, review of imaging,  review of symptoms, physical exam, discussion of differential diagnosis, discussion of treatment options, and documentation.   Thank you for involving me in the care of this patient.   Drake Leach PA-C Dept. of Neurosurgery

## 2023-03-20 ENCOUNTER — Ambulatory Visit: Payer: MEDICAID | Admitting: Orthopedic Surgery

## 2023-03-22 ENCOUNTER — Ambulatory Visit: Payer: MEDICAID | Attending: Orthopedic Surgery

## 2023-03-22 DIAGNOSIS — M5459 Other low back pain: Secondary | ICD-10-CM | POA: Insufficient documentation

## 2023-03-22 DIAGNOSIS — M6281 Muscle weakness (generalized): Secondary | ICD-10-CM | POA: Insufficient documentation

## 2023-03-22 DIAGNOSIS — R262 Difficulty in walking, not elsewhere classified: Secondary | ICD-10-CM | POA: Insufficient documentation

## 2023-03-30 NOTE — Progress Notes (Deleted)
Referring Physician:  No referring provider defined for this encounter.  Primary Physician:  Dana Allan, MD  History of Present Illness: 03/30/2023*** [name] has a history of HTN, DM, asthma/COPD, emphysema, GERD, goiter, DM peripheral neuropathy, chronic pain syndrome.    Last seen by me for phone visit on 01/29/23.   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 LBP likely due to L5-S1. Thigh pain may be lumbar medidated. Numbness/tingling from knees down is likely due to her neuropathy.   She was to continue with PT. We discussed she may need to get updated EMG, DEXA scan, and lumbar flexion/extension xrays prior to any surgical consideration. Smoking cessation discussed and encouraged. HgBA1c would need to be less than 7.5 prior to any surgery. It was 9.4 on 01/09/23.   She was referred [facility] management ([facility] not take over her pain medications.   She is here for follow up. ***  She had left ankle fracture on 02/10/23- is wearing a boot.***     She has chronic constant LBP with bilateral thigh (entire leg) pain to her knees x 5-6 years. LBP > leg pain. She has numbness and tingling in posterior calves to her feet. Pain is worse with bending and prolonged standing. She notes weakness in both legs. She has OA in both knees and they told her to get back fixed prior to TKAs.   Last HgbA1c on 01/09/23 was 9.4. She is working on this.    Bowel/Bladder Dysfunction: none   She smokes 1 ppd x 20 years. She is working on quitting and is at 1/2 to 1 ppd.    Conservative measures:  Physical therapy: initial eval on 01/16/23 and had a visit on 01/22/23, 02/05/23, and 03/15/23 Multimodal medical therapy including regular antiinflammatories: voltaren, neurontin, ibuprofen, mobic, ultram, norco Injections: Unable to have epidural steroid injections due to elevation of blood sugars post  injection  Past Surgery: denies spine surgery  [name] has no symptoms of cervical myelopathy. She has intermittent dizziness.   The symptoms are causing a significant impact on the patient's life.   Review of Systems:  A 10 point review of systems is negative, except for the pertinent positives and negatives detailed in the HPI.  Past Medical History: Past Medical History:  Diagnosis Date   Absence of bladder continence 04/01/2010   Overview:   Urinary Loss Of Control   Acid reflux 04/01/2010   Overview:    Acute gastroenteritis 03/14/2022   Anxiety state, unspecified 03/26/2009   Arthralgia 08/14/2018   ASTHMA 02/02/2007   Asthma    Chest wall pain 08/02/2018   DEPRESSION 02/16/2009   DIABETES MELLITUS, TYPE I 02/02/2007   Dizziness 09/18/2018   Eczema 08/02/2018   Elevated cholesterol 06/02/2022   Encounter to establish care 06/02/2022   Endometriosis    Essential hypertension    GERD 12/21/2008   Herpes    at 18   Hyperlipidemia    Hypertension 03/03/2020   Lesion of vocal cord    Mild intermittent asthma without complication 08/12/2018   Osteoarthritis    Peripheral polyneuropathy 11/29/2017   Primary osteoarthritis of both feet 11/30/2019   Primary osteoarthritis of both hands 11/30/2019   Bilateral mild osteoarthritis.   Retinal hemorrhage of right eye 11/04/2019   Severe nonproliferative diabetic retinopathy of right eye, without macular edema, associated with type 1 diabetes mellitus (HCC) 11/04/2019  Shortness of breath 09/25/2007   Uncontrolled type 1 diabetes mellitus with hyperglycemia, with long-term current use of insulin (HCC) 03/14/2022          Uncontrolled type 2 diabetes mellitus with hyperglycemia, with long-term current use of insulin (HCC) 03/14/2022   Vitreous hemorrhage of right eye (HCC) 08/16/2021   Vocal fold leukoplakia     Past Surgical History: Past Surgical History:  Procedure Laterality Date   ABDOMINAL HYSTERECTOMY   2009   BSO   DIRECT LARYNGOSCOPY N/A 08/17/2017   Procedure: DIRECT LARYNGOSCOPY WITH OPERATING TELESCOPE WITH BIOPSY;  Surgeon: Graylin Shiver, MD;  Location: [facility];  Service: ENT;  Laterality: N/A;   NASOPHARYNGEAL BIOPSY Right 08/17/2017   Procedure: NASOPHARYNGEAL BIOPSY RIGHT NASAL LESION;  Surgeon: Graylin Shiver, MD;  Location: [facility];  Service: ENT;  Laterality: Right;    Allergies: Allergies as of 04/05/2023   (No Known Allergies)    Medications: Outpatient Encounter Medications as of 04/05/2023  Medication Sig   Continuous Glucose Sensor (DEXCOM G7 SENSOR) MISC Change every 10 days   ALPRAZolam (XANAX) 0.5 MG tablet Take 0.5 mg by mouth 2 (two) times daily.   amLODipine (NORVASC) 10 MG tablet Take 1 tablet (10 mg total) by mouth daily.   atorvastatin (LIPITOR) 10 MG tablet Take 1 tablet (10 mg total) by mouth at bedtime.   Blood Glucose Monitoring Suppl (ACCU-CHEK GUIDE ME) w/Device KIT Use to check blood sugar daily   cetirizine (ZYRTEC) 10 MG tablet Take 1 tablet (10 mg total) by mouth daily. (Patient not taking: Reported on 01/09/2023)   Cholecalciferol (VITAMIN D3) 50 MCG (2000 UT) capsule Take 2,000 Units by mouth daily.   clonazePAM (KLONOPIN) 0.5 MG tablet Take 0.25 mg by mouth 2 (two) times daily as needed. (Patient not taking: Reported on 01/09/2023)   Continuous Glucose Sensor (DEXCOM G6 SENSOR) MISC 1 each by Does not apply route See admin instructions. Change sensor every 10 days; E10.9   Continuous Glucose Transmitter (DEXCOM G6 TRANSMITTER) MISC Use as instructed to check blood sugars, change every 3 months.   diclofenac (VOLTAREN) 75 MG EC tablet  (Patient not taking: Reported on 01/09/2023)   fluticasone (FLONASE) 50 MCG/ACT nasal spray Place 2 sprays into both nostrils daily. (Patient not taking: Reported on 01/09/2023)   gabapentin (NEURONTIN) 300 MG capsule TAKE 1 CAPSULE BY MOUTH 3 TIMES DAILY AS NEEDED   glucagon 1 MG injection Inject 1 mg into the  muscle once as needed.   HYDROcodone-acetaminophen (NORCO) 5-325 MG tablet Take 1 tablet by mouth every 8 (eight) hours as needed for moderate pain. (Patient not taking: Reported on 01/09/2023)   Ibuprofen (ADVIL PO) Take 2-3 tablets by mouth every 6 (six) hours as needed.   insulin aspart (NOVOLOG) 100 UNIT/ML injection INJECT 40 UNITS DAILY PER INSULIN PUMP (MAX DOSAGE)   Insulin Pen Needle (PEN NEEDLES) 32G X 5 MM MISC Use as instructed   ipratropium (ATROVENT HFA) 17 MCG/ACT inhaler Inhale 2 puffs into the lungs every 4 (four) hours as needed for wheezing.   methocarbamol (ROBAXIN) 500 MG tablet Take 1 tablet (500 mg total) by mouth every 8 (eight) hours as needed for muscle spasms. Be careful, this can make you sleepy. (Patient not taking: Reported on 01/09/2023)   Multiple Vitamin (MULTIVITAMIN WITH MINERALS) TABS tablet Take 1 tablet by mouth daily.   omeprazole (PRILOSEC) 40 MG capsule TAKE 1 CAPSULE BY MOUTH TWICE A DAY. PATIENT NEEDS FOLLOW UP APPOINTMENT FOR FUTURE REFILLS. PLEASE CALL  [phone] TO SCHEDULE AN APPOINTMENT.   ondansetron (ZOFRAN-ODT) 4 MG disintegrating tablet Take 1 tablet (4 mg total) by mouth every 8 (eight) hours as needed for nausea or vomiting.   oxyCODONE-acetaminophen (PERCOCET) 5-325 MG tablet Take 1 tablet by mouth every 4 (four) hours as needed for severe pain.   REXULTI 1 MG TABS tablet Take 1 mg by mouth daily.   rOPINIRole (REQUIP) 1 MG tablet Take 1 tablet (1 mg total) by mouth at bedtime. TAKE 1 TABLET BY MOUTH AT BEDTIME   sertraline (ZOLOFT) 25 MG tablet Take 50 mg by mouth daily.   Tiotropium Bromide Monohydrate (SPIRIVA RESPIMAT) 2.5 MCG/ACT AERS Inhale 2 puffs into the lungs daily at 2 PM.   traZODone (DESYREL) 100 MG tablet PLEASE SEE ATTACHED FOR DETAILED DIRECTIONS   VENTOLIN HFA 108 (90 Base) MCG/ACT inhaler  (Patient not taking: Reported on 01/09/2023)   No facility-administered encounter medications on file as of 04/05/2023.    Social  History: Social History   Tobacco Use   Smoking status: Every Day    Current packs/day: 1.50    Average packs/day: 1.5 packs/day for 31.0 years (46.5 ttl pk-yrs)    Types: Cigarettes   Smokeless tobacco: Never   Tobacco comments:    11/03/2021 -  states she smokes a pack and a half a day  Vaping Use   Vaping status: Former   Quit date: 02/23/2017   Devices: only used tobacco   Substance Use Topics   Alcohol use: Not Currently    Alcohol/week: 0.0 standard drinks of alcohol   Drug use: No    Family Medical History: Family History  Problem Relation Age of Onset   COPD Mother    Arthritis Mother    Diabetes Mother    COPD Father    Cancer Father    Arthritis Father    Diabetes Father    Diabetes type I Brother    Diabetes Maternal Aunt    Emphysema Maternal Uncle    Diabetes Maternal Grandfather    Colon cancer Neg Hx    Kidney disease Neg Hx    Liver disease Neg Hx    Esophageal cancer Neg Hx    Rectal cancer Neg Hx    Stomach cancer Neg Hx     Physical Examination: There were no vitals filed for this visit.  General: Patient is well developed, well nourished, calm, collected, and in no apparent distress. Attention to examination is appropriate.  Respiratory: Patient is breathing without any difficulty.   NEUROLOGICAL:     Awake, alert, oriented to person, place, and time.  Speech is clear and fluent. Fund of knowledge is appropriate.   Cranial Nerves: Pupils equal round and reactive to light.  Facial tone is symmetric.    Limited ROM of lumbar spine with pain on flexion Mild lower posterior lumbar tenderness.   No abnormal lesions on exposed skin.   Strength: Side Biceps Triceps Deltoid Interossei Grip Wrist Ext. Wrist Flex.  R 5 5 5 5 5 5 5   L 5 5 5 5 5 5 5    Side Iliopsoas Quads Hamstring PF DF EHL  R 5 5 5 5 5 5   L 5 5 5 5 5 5    Reflexes are 2+ and symmetric at the biceps, triceps, brachioradialis, and achilles. She requested I did not test DTR  at patella bilaterally due to knee pain.   Hoffman's is absent.  Clonus is not present.   Bilateral upper and lower extremity sensation  is intact to light touch, but diminished in bilateral legs from knees down.   She has a slight limping gait.   Medical Decision Making  Imaging: Nothing new.   Assessment and Plan: [name] is a pleasant 47 y.o. female has chronic constant LBP with bilateral thigh (entire leg) pain to her knees x 5-6 years. LBP > leg pain. She has numbness and tingling in posterior calves to her feet. Pain is worse with bending and prolonged standing.  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 LBP likely due to L5-S1. Thigh pain may be lumbar medidated. Numbness/tingling from knees down is likely due to her neuropathy.   Treatment options discussed with patient and following plan made:   - Order for physical therapy for lumbar spine to [facility]. Patient to call to schedule appointment.  - Unable to have ESIs as they elevate her blood sugars.  - Continue current medications including prn ultram (from ortho). Reviewed dosing and side effects.  - Continue neurontin from other providers. She will ask about possibly increasing it.  - Discussed she may need to get updated EMG, DEXA scan, and lumbar flexion/extension xrays prior to any surgical consideration.  - Smoking cessation discussed and encouraged.  - HgBA1c would need to be less than 7.5 prior to any surgery. It was 9.4 on 04/17/22 (this was right after her husband passed away).  - She was given 5 day supply of ultram on 4/15- I can send a one time refill of this to be filled on Saturday 4/20.  - Follow up with me in 6 weeks. Will let me know if she is discharged from PT.   I spent a total of 45 minutes in face-to-face and non-face-to-face activities related to this patient's care today including review of outside records, review of imaging,  review of symptoms, physical exam, discussion of differential diagnosis, discussion of treatment options, and documentation.   Thank you for involving me in the care of this patient.   Drake Leach PA-C Dept. of Neurosurgery

## 2023-04-02 ENCOUNTER — Ambulatory Visit: Payer: MEDICAID | Admitting: Orthopedic Surgery

## 2023-04-04 ENCOUNTER — Ambulatory Visit: Payer: MEDICAID

## 2023-04-04 DIAGNOSIS — M5459 Other low back pain: Secondary | ICD-10-CM

## 2023-04-04 DIAGNOSIS — R262 Difficulty in walking, not elsewhere classified: Secondary | ICD-10-CM | POA: Diagnosis present

## 2023-04-04 DIAGNOSIS — M6281 Muscle weakness (generalized): Secondary | ICD-10-CM

## 2023-04-04 NOTE — Therapy (Signed)
OUTPATIENT PHYSICAL THERAPY THORACOLUMBAR TREATMENT   Patient Name: [name] MRN: [id number] DOB:07-26-1975, 47 y.o., female Today's Date: 04/04/2023  END OF SESSION:  PT End of Session - 04/04/23 1438     Visit Number 5    Number of Visits 17    Date for PT Re-Evaluation 04/26/23    PT Start Time 1440    PT Stop Time 1515    PT Time Calculation (min) 35 min    Activity Tolerance Patient tolerated treatment well    Behavior During Therapy [facility] for tasks assessed/performed             Past Medical History:  Diagnosis Date   Absence of bladder continence 04/01/2010   Overview:   Urinary Loss Of Control   Acid reflux 04/01/2010   Overview:    Acute gastroenteritis 03/14/2022   Anxiety state, unspecified 03/26/2009   Arthralgia 08/14/2018   ASTHMA 02/02/2007   Asthma    Chest wall pain 08/02/2018   DEPRESSION 02/16/2009   DIABETES MELLITUS, TYPE I 02/02/2007   Dizziness 09/18/2018   Eczema 08/02/2018   Elevated cholesterol 06/02/2022   Encounter to establish care 06/02/2022   Endometriosis    Essential hypertension    GERD 12/21/2008   Herpes    at 18   Hyperlipidemia    Hypertension 03/03/2020   Lesion of vocal cord    Mild intermittent asthma without complication 08/12/2018   Osteoarthritis    Peripheral polyneuropathy 11/29/2017   Primary osteoarthritis of both feet 11/30/2019   Primary osteoarthritis of both hands 11/30/2019   Bilateral mild osteoarthritis.   Retinal hemorrhage of right eye 11/04/2019   Severe nonproliferative diabetic retinopathy of right eye, without macular edema, associated with type 1 diabetes mellitus (HCC) 11/04/2019   Shortness of breath 09/25/2007   Uncontrolled type 1 diabetes mellitus with hyperglycemia, with long-term current use of insulin (HCC) 03/14/2022          Uncontrolled type 2 diabetes mellitus with hyperglycemia, with long-term current use of insulin (HCC) 03/14/2022   Vitreous hemorrhage of right eye (HCC)  08/16/2021   Vocal fold leukoplakia    Past Surgical History:  Procedure Laterality Date   ABDOMINAL HYSTERECTOMY  2009   BSO   DIRECT LARYNGOSCOPY N/A 08/17/2017   Procedure: DIRECT LARYNGOSCOPY WITH OPERATING TELESCOPE WITH BIOPSY;  Surgeon: Graylin Shiver, MD;  Location: [facility] OR;  Service: ENT;  Laterality: N/A;   NASOPHARYNGEAL BIOPSY Right 08/17/2017   Procedure: NASOPHARYNGEAL BIOPSY RIGHT NASAL LESION;  Surgeon: Graylin Shiver, MD;  Location: [facility] OR;  Service: ENT;  Laterality: Right;   Patient Active Problem List   Diagnosis Date Noted   Emphysema lung (HCC) 08/19/2022   Gastroparesis due to DM (HCC) 08/19/2022   Benzodiazepine dependence (HCC) 08/19/2022   Hypertension associated with diabetes (HCC) 08/19/2022   Cough 08/19/2022   Aortic atherosclerosis (HCC) 08/19/2022   Diabetes mellitus type I (HCC) 06/02/2022   Hyponatremia 03/14/2022   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 associated with type 1 diabetes mellitus (HCC) 08/16/2021   Primary osteoarthritis of both knees 11/30/2019   Severe nonproliferative diabetic retinopathy of right eye, without macular edema, associated with type 1 diabetes mellitus (HCC) 11/04/2019   Severe nonproliferative diabetic retinopathy of left eye, without macular edema, associated with type 1 diabetes mellitus (HCC) 11/04/2019   Chronic pain syndrome 11/13/2018   Snoring 08/12/2018   Breast lump 08/12/2018   Lung nodule 08/12/2018   Diabetic peripheral neuropathy (HCC) 01/11/2018  Low back pain with left-sided sciatica 11/29/2017   Multinodular goiter 07/29/2015   Restless leg 11/07/2013   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   Cholesteatoma 09/11/2011   Allergy to environmental factors 09/11/2011   Anxiety 05/01/2011   INSOMNIA 02/18/2010   GERD 12/21/2008   SMOKER 09/25/2007    PCP: Dana Allan MD  REFERRING PROVIDER: Drake Leach PA-C  REFERRING DIAG: [phone] (ICD-10-CM) -  Spondylolisthesis of lumbar region M51.36 (ICD-10-CM) - DDD (degenerative disc disease), lumbar M54.16 (ICD-10-CM) - Lumbar radiculopathy G62.9 (ICD-10-CM) - Neuropathy  Rationale for Evaluation and Treatment: Rehabilitation  THERAPY DIAG:  Other low back pain  Muscle weakness (generalized)  Difficulty in walking, not elsewhere classified  ONSET DATE: ~ 9-10 years ago   SUBJECTIVE:                                                                                                                                                                                           SUBJECTIVE STATEMENT: Pt reports LBP at a 6/10 NPS. Pt reports increased back pain from wearing her CAM boot, throwing her back off "balance".   PERTINENT HISTORY:  Pt is a 47 y.o. female referred to OPPT for chronic LBP. Pain began ~10 years ago. Pain is upper lower back that refers in dermatomal pattern from buttocks then wrap around to anterior thighs. Pain has increased in severity in the past year where pt reports "I can't do anything anymore". Pain described as tightness, achey. LBP is sharp, dull, and achey. Reports her back feels like a "brick" or "block". Aggravating factors, include laying down on her back, standing, walking, bending over. Reports she has a hard time standing long enough to wash her dishes. Can complete short, community errands. Worst pain described as a 9/10 NPS. Best pain 3-4/10 NPS. Currently 6/10 NPS. Pain improved with rest, hook lying, and medications.  PAIN:  Are you having pain? Yes: NPRS scale: 6/10 Pain location: Lower back paraspinals Pain description: achey, sharp Aggravating factors: bending over, walking, standing, sleeping Relieving factors: medication  PRECAUTIONS: None  WEIGHT BEARING RESTRICTIONS: No  FALLS:  Has patient fallen in last 6 months? No   OCCUPATION: Does not work   PLOF: Independent  PATIENT GOALS: Improve her pain   NEXT MD VISIT: After PT   OBJECTIVE:    DIAGNOSTIC FINDINGS:  She has known slip at L5-S1 with pars at L5 along with severe DDD and bilateral moderate/severe foraminal stenosis  PATIENT SURVEYS:  FOTO Deferred to next session  SCREENING FOR RED FLAGS: Bowel or bladder incontinence: No Spinal tumors: No Cauda equina syndrome: No Compression fracture: No Abdominal aneurysm: No  COGNITION:  Overall cognitive status: Within functional limits for tasks assessed     SENSATION: Light touch: WFL  MUSCLE LENGTH: Hamstrings SLR: Right  10 deg; Left 12  deg Thomas test: Right 15 deg; Left 15 deg  POSTURE:  rounded shoulders, forward head, and decreased lumbar lordosis L hip hike in standing   PALPATION: TTP along B upper glut meds, B paraspinals from S1 to T8 (bra strap)   LUMBAR ROM:   AROM eval  Flexion 50%*  Extension 25%*  Right lateral flexion 50%*  Left lateral flexion 50%*  Right rotation 75%*  Left rotation 75%*   (Blank rows = not tested)  LOWER EXTREMITY ROM:     Active  Right eval Left eval  Hip flexion    Hip extension    Hip abduction    Hip adduction    Hip internal rotation    Hip external rotation    Knee flexion    Knee extension    Ankle dorsiflexion    Ankle plantarflexion    Ankle inversion    Ankle eversion     (Blank rows = not tested)  LOWER EXTREMITY MMT:    MMT Right eval Left eval  Hip flexion 3+ 3+  Hip extension    Hip abduction 4 4  Hip adduction    Hip internal rotation 4 4  Hip external rotation 4 4  Knee flexion 4 4  Knee extension 5 4  Ankle dorsiflexion 5 5  Ankle plantarflexion 5 5  Ankle inversion    Ankle eversion     (Blank rows = not tested)  LUMBAR SPECIAL TESTS:  Straight leg raise test: Negative, Slump test: Negative, and FABER test: Positive  FADDIR: Negative bilat  FUNCTIONAL TESTS:  5 times sit to stand: 21.43 seconds   JOINT MOBILITY: Lumbar CPA's and UPA's hypomobile and painful L5-T12  TODAY'S TREATMENT:                                                                                                                               DATE: 04/04/23  There.ex:  Seated hamstring stretch 2 x30 sec RLE/LLE  Seated clamshell w/ GTB RLE/LLE 2 x10/ each side Seated marches w/ GTB RLE/LLE 2 x10/ each side  Seated hip adductor squeeze w/ blue squish ball x10 5 second hold Seated blue physioball roll outs forwards: x12 in pain free ranges  Seated GTB anti rotation paloff press: x8/side Seated raises with 1# medicine ball against heat pack at lumbar spine 3 x10 (heat pack d/c for pt reporting heat pack hurting her back, and too warm for her)  Seated scap retractions with GTB: 2x12   PATIENT EDUCATION:  Education details: POC, prognosis, HEP (reps, sets, frequency) Person educated: Patient Education method: Explanation and Handouts Education comprehension: verbalized understanding and needs further education  HOME EXERCISE PROGRAM: Access Code: [email or web address] URL: [email or web address] Date: 03/15/2023 Prepared by: Ronnie Derby  Exercises - Seated Gluteal Sets  - 1  x daily - 5 x weekly - 3 sets - 10 reps - 5 hold - Scapular Retraction with Resistance  - 1 x daily - 5 x weekly - 2 sets - 12 reps - Seated Anti-Rotation Press With Anchored Resistance  - 1 x daily - 5 x weekly - 2 sets - 12 reps - Seated Hamstring Stretch with Strap  - 7 x weekly - 1 sets - 2-3 reps - 30 hold   Access Code: [email or web address] URL: [email or web address] Date: 02/05/2023 Prepared by: Ronnie Derby  Exercises - Sit to Stand Without Arm Support  - 1 x daily - 7 x weekly - 3 sets - 8 reps - Seated Hamstring Stretch  - 1 x daily - 7 x weekly - 1 sets - 3 reps - 10 hold - Seated Gluteal Sets  - 1 x daily - 7 x weekly - 3 sets - 10 reps - 5 hold - Standing Quadriceps Stretch  - 1 x daily - 7 x weekly - 1 sets - 3 reps - 10 hold  Access Code: [email or web address] URL: [email or web address] Date: 01/22/2023 Prepared by: Ronnie Derby  Exercises - Sit to Stand Without Arm Support  - 1 x daily - 7 x weekly - 3 sets - 8 reps - Supine Gluteal Sets  - 1 x daily - 7 x weekly - 3 sets - 10 reps - 5 hold - Seated Hamstring Stretch  - 1 x daily - 7 x weekly - 1 sets - 3 reps - 10 hold - Prone Lying with Towel Roll (Hip Flexor Stretch) (BKA)  - 1 x daily - 7 x weekly - 1 sets - 3 reps - 10 hold  Access Code: [email or web address] URL: [email or web address] Date: 01/16/2023 Prepared by: Ronnie Derby  Exercises - Sit to Stand Without Arm Support  - 1 x daily - 7 x weekly - 3 sets - 8 reps - Supine Gluteal Sets  - 1 x daily - 7 x weekly - 3 sets - 10 reps - 5 hold - Supine Double Knee to Chest  - 1 x daily - 7 x weekly - 2 sets - 8 reps - 10 hold  ASSESSMENT:  CLINICAL IMPRESSION:  Pt arrived 10 minutes late for appt today. Session focused on seated lumbar mobility and bilat hip strengthening exercises, 2/2 WB'ing limitations from CAM boot. Pt presents w/ increased lumbar pain/ stiffness that does not improve with mobility exercises performed today. A heat pack was applied to the lumbar spine during seated exercise, pt notes mild relief of LBP however, reports the heat was too warm as time went on and heat pack was d/c. Pt will continue to benefit from skilled PT services to address remaining lumbar strength and pain deficits and maximize functional mobility to improve QoL.  OBJECTIVE IMPAIRMENTS: decreased mobility, difficulty walking, decreased ROM, decreased strength, hypomobility, impaired flexibility, and pain.   ACTIVITY LIMITATIONS: lifting, bending, sitting, standing, squatting, and sleeping  PARTICIPATION LIMITATIONS: cleaning, laundry, community activity, occupation, and yard work  PERSONAL FACTORS: Age, Past/current experiences, Time since onset of injury/illness/exacerbation, and 3+ comorbidities: DM type 2, HTN, HLD, OA, peripheral polyneuropathy   are also affecting patient's functional outcome.   REHAB  POTENTIAL: Poor Chronicity of symptoms, PMH, smoking history  CLINICAL DECISION MAKING: Evolving/moderate complexity  EVALUATION COMPLEXITY: Moderate   GOALS: Goals reviewed with patient? No  SHORT TERM GOALS: Target date: 04/05/23  Pt will be independent with HEP to improve pain, Lumbar AROM, and LE strength to maximize functional capacity.  Baseline: 01/16/23: given HEP; 03/15/23: Not compliant since ankle fracture on 02/10/23 Goal status: INITIAL  LONG TERM GOALS: Target date: 04/26/23  Pt will improve FOTO to target score to demonstrate clinically significant improvement in functional mobility. Baseline: 01/16/23: deferred to next session; 01/22/23: 19/36; 03/15/23: 36/36 Goal status: MET  2.  Pt will improve 5xSTS to 12 seconds or less to demonstrate clinically significant improvement in LE strength to assist in tolerance for standing and walking ADL's Baseline: 01/16/23: 21.43 seconds; 03/15/23: deferred due to next session Goal status: INITIAL  3.  Pt will improve 6 MWT by 165' to demonstrate clinically significant improvement in community walking distances. Baseline: 01/16/23: deferred to next session; 03/15/23: deferred. Pt with fractured fibula in walking boot. Completed 2 MWT: 330' up to 8/10 NPS.  Goal status: INITIAL  4.  Pt will report worst pain as a 6/10 NPS with functional tasks to demonstrate clinically significant improvement in functional mobility  Baseline: 01/16/23: worst pain 9/10 NPS; 03/15/23: 9-10/10 NPS.  Goal status: INITIAL   PLAN:  PT FREQUENCY: 1-2x/week  PT DURATION: 6 weeks  PLANNED INTERVENTIONS: Therapeutic exercises, Therapeutic activity, Neuromuscular re-education, Balance training, Gait training, Patient/Family education, Self Care, Joint mobilization, Joint manipulation, Aquatic Therapy, Dry Needling, Electrical stimulation, Spinal manipulation, Spinal mobilization, Cryotherapy, Moist heat, Manual therapy, and Re-evaluation.  PLAN FOR NEXT SESSION:  Seated core and lumbar mobility exercises.   Lovie Macadamia, SPT  Delphia Grates. Fairly IV, PT, DPT Physical Therapist- [place]  [facility]  04/04/2023, 4:14 PM

## 2023-04-05 ENCOUNTER — Ambulatory Visit: Payer: MEDICAID | Admitting: Orthopedic Surgery

## 2023-04-13 ENCOUNTER — Ambulatory Visit: Payer: MEDICAID | Admitting: Family Medicine

## 2023-04-18 ENCOUNTER — Encounter: Payer: Self-pay | Admitting: Internal Medicine

## 2023-04-18 DIAGNOSIS — I10 Essential (primary) hypertension: Secondary | ICD-10-CM

## 2023-04-20 MED ORDER — AMLODIPINE BESYLATE 10 MG PO TABS
10.0000 mg | ORAL_TABLET | Freq: Every day | ORAL | 0 refills | Status: DC
Start: 2023-04-20 — End: 2023-12-04

## 2023-05-03 ENCOUNTER — Ambulatory Visit: Payer: MEDICAID | Admitting: Orthopedic Surgery

## 2023-05-15 ENCOUNTER — Other Ambulatory Visit: Payer: Self-pay | Admitting: Endocrinology

## 2023-05-15 DIAGNOSIS — E1065 Type 1 diabetes mellitus with hyperglycemia: Secondary | ICD-10-CM

## 2023-05-21 ENCOUNTER — Other Ambulatory Visit: Payer: MEDICAID

## 2023-05-22 ENCOUNTER — Encounter: Payer: Self-pay | Admitting: Orthopedic Surgery

## 2023-05-26 ENCOUNTER — Emergency Department
Admission: EM | Admit: 2023-05-26 | Discharge: 2023-05-26 | Disposition: A | Discharge status: Home / Self Care | Payer: MEDICAID | Attending: Emergency Medicine | Admitting: Emergency Medicine

## 2023-05-26 ENCOUNTER — Other Ambulatory Visit: Payer: Self-pay

## 2023-05-26 DIAGNOSIS — R739 Hyperglycemia, unspecified: Secondary | ICD-10-CM | POA: Diagnosis present

## 2023-05-26 DIAGNOSIS — I10 Essential (primary) hypertension: Secondary | ICD-10-CM | POA: Diagnosis not present

## 2023-05-26 DIAGNOSIS — E860 Dehydration: Secondary | ICD-10-CM | POA: Insufficient documentation

## 2023-05-26 DIAGNOSIS — E1165 Type 2 diabetes mellitus with hyperglycemia: Secondary | ICD-10-CM | POA: Insufficient documentation

## 2023-05-26 LAB — URINALYSIS, ROUTINE W REFLEX MICROSCOPIC
Bilirubin Urine: NEGATIVE
Glucose, UA: >=500 mg/dL — AB
Ketones, ur: NEGATIVE mg/dL
Leukocytes,Ua: NEGATIVE
Nitrite: NEGATIVE
Protein, ur: NEGATIVE mg/dL
Specific Gravity, Urine: 1 — ABNORMAL LOW (ref 1.005–1.030)
pH: 6 (ref 5.0–8.0)

## 2023-05-26 LAB — CBG MONITORING, ED
Glucose-Capillary: 264 mg/dL — ABNORMAL HIGH (ref 70–99)
Glucose-Capillary: 242 mg/dL — ABNORMAL HIGH (ref 70–99)

## 2023-05-26 LAB — BASIC METABOLIC PANEL WITH GFR
Anion gap: 11 (ref 5–15)
BUN: 7 mg/dL (ref 6–20)
CO2: 24 mmol/L (ref 22–32)
Calcium: 9 mg/dL (ref 8.9–10.3)
Chloride: 90 mmol/L — ABNORMAL LOW (ref 98–111)
Creatinine, Ser: 0.62 mg/dL (ref 0.44–1.00)
GFR, Estimated: >60 mL/min
Glucose, Bld: 275 mg/dL — ABNORMAL HIGH (ref 70–99)
Potassium: 4.2 mmol/L (ref 3.5–5.1)
Sodium: 125 mmol/L — ABNORMAL LOW (ref 135–145)

## 2023-05-26 LAB — CBC
HCT: 45.1 % (ref 36.0–46.0)
Hemoglobin: 15.9 g/dL — ABNORMAL HIGH (ref 12.0–15.0)
MCH: 30.6 pg (ref 26.0–34.0)
MCHC: 35.3 g/dL (ref 30.0–36.0)
MCV: 86.9 fL (ref 80.0–100.0)
Platelets: 240 10*3/uL (ref 150–400)
RBC: 5.19 MIL/uL — ABNORMAL HIGH (ref 3.87–5.11)
RDW: 12.5 % (ref 11.5–15.5)
WBC: 5.2 10*3/uL (ref 4.0–10.5)
nRBC: 0 % (ref 0.0–0.2)

## 2023-05-26 LAB — BETA-HYDROXYBUTYRIC ACID: Beta-Hydroxybutyric Acid: 0.51 mmol/L — ABNORMAL HIGH (ref 0.05–0.27)

## 2023-05-26 LAB — POC URINE PREG, ED: Preg Test, Ur: NEGATIVE

## 2023-05-26 MED ORDER — OMEPRAZOLE 20 MG PO TBDD
40.0000 mg | DELAYED_RELEASE_TABLET | Freq: Every day | ORAL | Status: DC
Start: 2023-05-26 — End: 2023-05-26

## 2023-05-26 MED ORDER — SODIUM CHLORIDE 0.9 % IV BOLUS
1000.0000 mL | Freq: Once | INTRAVENOUS | Status: AC
  Administered 2023-05-26: 1000 mL via INTRAVENOUS

## 2023-05-26 MED ORDER — PANTOPRAZOLE SODIUM 40 MG PO TBEC
40.0000 mg | DELAYED_RELEASE_TABLET | Freq: Every day | ORAL | Status: DC
  Administered 2023-05-26: 40 mg via ORAL
  Filled 2023-05-26: qty 1

## 2023-05-26 MED ORDER — ONDANSETRON HCL 4 MG/2ML IJ SOLN
4.0000 mg | Freq: Once | INTRAMUSCULAR | Status: DC
  Filled 2023-05-26: qty 2

## 2023-05-26 MED ORDER — ALPRAZOLAM 0.5 MG PO TABS
0.5000 mg | ORAL_TABLET | Freq: Once | ORAL | Status: AC
  Administered 2023-05-26: 0.5 mg via ORAL
  Filled 2023-05-26: qty 1

## 2023-05-26 MED ORDER — AMLODIPINE BESYLATE 5 MG PO TABS
10.0000 mg | ORAL_TABLET | Freq: Once | ORAL | Status: AC
  Administered 2023-05-26: 10 mg via ORAL
  Filled 2023-05-26: qty 2

## 2023-05-26 NOTE — ED Provider Notes (Signed)
[facility] Provider Note    Event Date/Time   First MD Initiated Contact with Patient 05/26/23 1349     (approximate)   History   Hyperglycemia   HPI  [name] is a 47 y.o. female history of insulin-dependent diabetes, anxiety, osteoarthritis, hypertension, GERD presents emergency department with nausea, decreased appetite, some vomiting and diarrhea for 1 week.  Patient states her CBGs been in the 400s.  Increased urination.  Denies fever or chills.  No chest pain or shortness of breath      Physical Exam   Triage Vital Signs: ED Triage Vitals  Encounter Vitals Group     BP 05/26/23 1243 (!) 169/93     Systolic BP Percentile --      Diastolic BP Percentile --      Pulse Rate 05/26/23 1243 (!) 103     Resp 05/26/23 1243 20     Temp 05/26/23 1245 98.1 F (36.7 C)     Temp src --      SpO2 05/26/23 1243 98 %     Weight --      Height --      Head Circumference --      Peak Flow --      Pain Score 05/26/23 1243 4     Pain Loc --      Pain Education --      Exclude from Growth Chart --     Most recent vital signs: Vitals:   05/26/23 1243 05/26/23 1245  BP: (!) 169/93   Pulse: (!) 103   Resp: 20   Temp:  98.1 F (36.7 C)  SpO2: 98%      General: Awake, no distress.   CV:  Good peripheral perfusion. regular rate and  rhythm Resp:  Normal effort. Lungs cta Abd:  No distention.   Other:     ED Results / Procedures / Treatments   Labs (all labs ordered are listed, but only abnormal results are displayed) Labs Reviewed  BASIC METABOLIC PANEL - Abnormal; Notable for the following components:      Result Value   Sodium 125 (*)    Chloride 90 (*)    Glucose, Bld 275 (*)    All other components within normal limits  CBC - Abnormal; Notable for the following components:   RBC 5.19 (*)    Hemoglobin 15.9 (*)    All other components within normal limits  URINALYSIS, ROUTINE W REFLEX MICROSCOPIC - Abnormal; Notable for  the following components:   Color, Urine COLORLESS (*)    APPearance CLEAR (*)    Specific Gravity, Urine 1.000 (*)    Glucose, UA >=500 (*)    Hgb urine dipstick SMALL (*)    Bacteria, UA RARE (*)    All other components within normal limits  BETA-HYDROXYBUTYRIC ACID - Abnormal; Notable for the following components:   Beta-Hydroxybutyric Acid 0.51 (*)    All other components within normal limits  CBG MONITORING, ED - Abnormal; Notable for the following components:   Glucose-Capillary 264 (*)    All other components within normal limits  CBG MONITORING, ED - Abnormal; Notable for the following components:   Glucose-Capillary 242 (*)    All other components within normal limits  POC URINE PREG, ED  CBG MONITORING, ED     EKG     RADIOLOGY     PROCEDURES:   Procedures   MEDICATIONS ORDERED IN ED: Medications  ondansetron (ZOFRAN) injection 4  mg (has no administration in time range)  pantoprazole (PROTONIX) EC tablet 40 mg (40 mg Oral Given 05/26/23 1556)  sodium chloride 0.9 % bolus 1,000 mL (0 mLs Intravenous Stopped 05/26/23 1638)  amLODipine (NORVASC) tablet 10 mg (10 mg Oral Given 05/26/23 1556)  ALPRAZolam (XANAX) tablet 0.5 mg (0.5 mg Oral Given 05/26/23 1619)     IMPRESSION / MDM / ASSESSMENT AND PLAN / ED COURSE  I reviewed the triage vital signs and the nursing notes.                              Differential diagnosis includes, but is not limited to, dka, viral illness, hypo/hyperglycemia, dehydration, metabolic dysfunction, uti  Patient's presentation is most consistent with acute illness / injury with system symptoms.   DKA is less likely as glucose is elevated but not critical and anion gap is normal  Beta hydroxybutyrate is elevated at 0.5 on, urinalysis reassuring  Dehydration most likely due to vomting and diarrhea, will rehydrate with iv fluids, corrected sodium with glucose levels does not indicate need for admission  Meds given: Normal  saline 1 L IV, Zofran 4 mg IV, Protonix p.o., patient amlodipine p.m.  Will have nursing staff recheck CBG after fluids   CBG did decrease to 242.  Patient has been sipping on drinks and tolerating fluids.  States she is feeling better.  She is to follow-up with her regular doctor to address her glucose issues.  Return emergency department worsening.  She is in agreement treatment plan.  Discharged stable condition.   FINAL CLINICAL IMPRESSION(S) / ED DIAGNOSES   Final diagnoses:  Hyperglycemia  Dehydration     Rx / DC Orders   ED Discharge Orders     None        Note:  This document was prepared using Dragon voice recognition software and may include unintentional dictation errors.    Faythe Ghee, PA-C 05/26/23 2019    Janith Lima, MD 05/27/23 [phone]

## 2023-05-26 NOTE — ED Triage Notes (Signed)
Pt to ED via POV from home. Pt reports nausea, decreased appetite, diarrhea x1wk. Pt also reports CBG has been in the 400s. Pt also reports increased urination.

## 2023-05-28 ENCOUNTER — Ambulatory Visit: Payer: MEDICAID | Admitting: Endocrinology

## 2023-05-28 ENCOUNTER — Encounter: Payer: Self-pay | Admitting: Endocrinology

## 2023-05-28 VITALS — BP 120/70 | HR 91 | Ht 64.0 in | Wt 121.2 lb

## 2023-05-28 DIAGNOSIS — E1065 Type 1 diabetes mellitus with hyperglycemia: Secondary | ICD-10-CM | POA: Diagnosis not present

## 2023-05-28 NOTE — Patient Instructions (Signed)
Call [employer] company to upgrade your pump to work work with [employer].

## 2023-05-28 NOTE — Progress Notes (Unsigned)
Outpatient Endocrinology Note Alejandra Amara Manalang, MD  05/29/23  Patient's Name: [name]    DOB: 16-Dec-1975    MRN: [id number]                                                    REASON OF VISIT: Follow up of type 1 diabetes mellitus  PCP: Dana Allan, MD  HISTORY OF PRESENT ILLNESS:   [name] is a 47 y.o. old female with past medical history listed below, is here for follow up of type 1 diabetes mellitus.  Patient was last seen by Dr. Lucianne Muss in June 2024.  Pertinent Diabetes History: Patient was diagnosed with type 1 diabetes mellitus in 1989.  She has been on insulin pump for several years including t:slim pump since 2017.  She has uncontrolled type 1 diabetes mellitus.  Chronic Diabetes Complications : Retinopathy: ? yes,. Last ophthalmology exam was done on ? 09/2022, following with ophthalmology regularly.  Nephropathy: no Peripheral neuropathy: no Coronary artery disease: no Stroke: on  Relevant comorbidities and cardiovascular risk factors: Obesity: no Body mass index is 20.8 kg/m.  Hypertension: Yes  Hyperlipidemia : Yes, on statin.   Current / Home Diabetic regimen includes:  Tandem t:slim pump, on Dexcom G7 using NovoLog U100  Insulin Pump setting:  Basal MN- 0.1u/hour  Bolus CHO Ratio (1unit:CHO) MN- 1:1, she has been mainly correcting manually with insulin dose of 0.5 to 4 units at a time, without carb counting.  Correction/Sensitivity: MN- 1:600  Target: 150  Active insulin time: 5 hours  Prior diabetic medications:   CONTINUOUS GLUCOSE MONITORING SYSTEM (CGMS) / INSULIN PUMP INTERPRETATION:                         Tandem Pump & Sensor Download (Reviewed and summarized below.)  Average total daily insulin:  7.73 units, Basal: 30% ( 2.29 units), Bolus: 70% (5.44 units),  100% manual bolus.   Control IQ Time in Use: 0%  Glycemic data:    CONTINUOUS GLUCOSE MONITORING SYSTEM (CGMS) INTERPRETATION: At today's visit, we reviewed CGM  downloads. The full report is scanned in the media. Reviewing the CGM trends, blood glucose are as follows:  Dexcom G7 CGM-  Sensor Download (Sensor download was reviewed and summarized below.) Dates: October 29 to May 28, 2023, 14 days  Glucose Management Indicator: 9.4% Sensor Average: 255 SD 55 CGM/Sensor usage:100%  Glycemic Trends:  <54: 0% 54-70: 0% 71-180: 5% 181-250: 47% 251-400: 48%  Interpretation: -Almost always hyperglycemia with most of the time blood sugar in the range of 250-300.  Occasionally up to 350s, related with meals and not enough meal boluses.  She has been bolusing for hyperglycemia and meals 2-4 times a day with a insulin bolus of 0.5 units to 4 units at a time.  No hypoglycemia.  Hypoglycemia: Patient has no hypoglycemic episodes. Patient has hypoglycemia awareness.  Factors modifying glucose control: 1.  Diabetic diet assessment: 3 meals a day.  2.  Staying active or exercising:   3.  Medication compliance: compliant most of the time.  HYPONATREMIA: This has been present for several years. She also has been on sertraline. Not on any hydrochlorothiazide. Recently not drinking excessive amount of fluids.  Managed by primary care provider.  Interval history 05/29/23 Pump and Dexcom G7 data as  reviewed above.  She has not upgrade of the pump and pump does not work in control IQ mode with Dexcom G7.  She is mostly having hyperglycemia.  She requires relatively low dose of insulin.  GMI from Dexcom 9.4%.  No other complaints today.  REVIEW OF SYSTEMS As per history of present illness.   PAST MEDICAL HISTORY: Past Medical History:  Diagnosis Date   Absence of bladder continence 04/01/2010   Overview:   Urinary Loss Of Control   Acid reflux 04/01/2010   Overview:    Acute gastroenteritis 03/14/2022   Anxiety state, unspecified 03/26/2009   Arthralgia 08/14/2018   ASTHMA 02/02/2007   Asthma    Chest wall pain 08/02/2018   DEPRESSION  02/16/2009   DIABETES MELLITUS, TYPE I 02/02/2007   Dizziness 09/18/2018   Eczema 08/02/2018   Elevated cholesterol 06/02/2022   Encounter to establish care 06/02/2022   Endometriosis    Essential hypertension    GERD 12/21/2008   Herpes    at 18   Hyperlipidemia    Hypertension 03/03/2020   Lesion of vocal cord    Mild intermittent asthma without complication 08/12/2018   Osteoarthritis    Peripheral polyneuropathy 11/29/2017   Primary osteoarthritis of both feet 11/30/2019   Primary osteoarthritis of both hands 11/30/2019   Bilateral mild osteoarthritis.   Retinal hemorrhage of right eye 11/04/2019   Severe nonproliferative diabetic retinopathy of right eye, without macular edema, associated with type 1 diabetes mellitus (HCC) 11/04/2019   Shortness of breath 09/25/2007   Uncontrolled type 1 diabetes mellitus with hyperglycemia, with long-term current use of insulin (HCC) 03/14/2022          Uncontrolled type 2 diabetes mellitus with hyperglycemia, with long-term current use of insulin (HCC) 03/14/2022   Vitreous hemorrhage of right eye (HCC) 08/16/2021   Vocal fold leukoplakia     PAST SURGICAL HISTORY: Past Surgical History:  Procedure Laterality Date   ABDOMINAL HYSTERECTOMY  2009   BSO   DIRECT LARYNGOSCOPY N/A 08/17/2017   Procedure: DIRECT LARYNGOSCOPY WITH OPERATING TELESCOPE WITH BIOPSY;  Surgeon: Graylin Shiver, MD;  Location: [facility] OR;  Service: ENT;  Laterality: N/A;   NASOPHARYNGEAL BIOPSY Right 08/17/2017   Procedure: NASOPHARYNGEAL BIOPSY RIGHT NASAL LESION;  Surgeon: Graylin Shiver, MD;  Location: [facility] OR;  Service: ENT;  Laterality: Right;    ALLERGIES: No Known Allergies  FAMILY HISTORY:  Family History  Problem Relation Age of Onset   COPD Mother    Arthritis Mother    Diabetes Mother    COPD Father    Cancer Father    Arthritis Father    Diabetes Father    Diabetes type I Brother    Diabetes Maternal Aunt    Emphysema Maternal Uncle     Diabetes Maternal Grandfather    Colon cancer Neg Hx    Kidney disease Neg Hx    Liver disease Neg Hx    Esophageal cancer Neg Hx    Rectal cancer Neg Hx    Stomach cancer Neg Hx     SOCIAL HISTORY: Social History   Socioeconomic History   Marital status: Legally Separated    Spouse name: Not on file   Number of children: 2   Years of education: Not on file   Highest education level: Not on file  Occupational History    Employer: UNEMPLOYED  Tobacco Use   Smoking status: Every Day    Current packs/day: 1.50    Average packs/day: 1.5 packs/day  for 31.0 years (46.5 ttl pk-yrs)    Types: Cigarettes   Smokeless tobacco: Never   Tobacco comments:    11/03/2021 -  states she smokes a pack and a half a day  Vaping Use   Vaping status: Former   Quit date: 02/23/2017   Devices: only used tobacco   Substance and Sexual Activity   Alcohol use: Not Currently    Alcohol/week: 0.0 standard drinks of alcohol   Drug use: No   Sexual activity: Yes    Partners: Male    Birth control/protection: None    Comment: 1st intercourse-14, partners- 15, current partner- 4 months  Other Topics Concern   Not on file  Social History Narrative   2 children--pt has custody. Husband pays child support.   Daily Caffeine Use-3 2 liters a day   Pt does not get regular exercise.   Social Determinants of Health   Financial Resource Strain: Patient Declined (03/13/2023)   Received from [facility]   Overall Financial Resource Strain (CARDIA)    Difficulty of Paying Living Expenses: Patient declined  Food Insecurity: Patient Declined (03/13/2023)   Received from [facility]   Hunger Vital Sign    Worried About Running Out of Food in the Last Year: Patient declined    Ran Out of Food in the Last Year: Patient declined  Transportation Needs: Patient Declined (03/13/2023)   Received from [facility] - Transportation    Lack of Transportation (Medical): Patient declined    Lack of  Transportation (Non-Medical): Patient declined  Physical Activity: Patient Declined (03/13/2023)   [facility] from [facility]   Exercise Vital Sign    Days of Exercise per Week: Patient declined    Minutes of Exercise per Session: Patient declined  Stress: Patient Declined (03/13/2023)   Received from [facility] - Occupational Stress Questionnaire    Feeling of Stress : Patient declined  Social Connections: Unknown (03/02/2023)   Received from [facility]   Social Network    Social Network: Not on file    MEDICATIONS:  Current Outpatient Medications  Medication Sig Dispense Refill   ALPRAZolam (XANAX) 0.5 MG tablet Take 0.5 mg by mouth 2 (two) times daily.     amLODipine (NORVASC) 10 MG tablet Take 1 tablet (10 mg total) by mouth daily. 90 tablet 0   Cholecalciferol (VITAMIN D3) 50 MCG (2000 UT) capsule Take 2,000 Units by mouth daily.     clonazePAM (KLONOPIN) 0.5 MG tablet Take 0.25 mg by mouth 2 (two) times daily as needed.     Continuous Glucose Sensor (DEXCOM G7 SENSOR) MISC Change every 10 days (Patient not taking: Reported on 05/28/2023) 3 each 3   gabapentin (NEURONTIN) 300 MG capsule TAKE 1 CAPSULE BY MOUTH 3 TIMES DAILY AS NEEDED 270 capsule 1   glucagon 1 MG injection Inject 1 mg into the muscle once as needed. 1 each 12   Ibuprofen (ADVIL PO) Take 2-3 tablets by mouth every 6 (six) hours as needed.     insulin aspart (NOVOLOG) 100 UNIT/ML injection INJECT 40 UNITS DAILY PER INSULIN PUMP (MAX DOSAGE) 30 mL 1   Insulin Pen Needle (PEN NEEDLES) 32G X 5 MM MISC Use as instructed 100 each 0   Multiple Vitamin (MULTIVITAMIN WITH MINERALS) TABS tablet Take 1 tablet by mouth daily.     omeprazole (PRILOSEC) 40 MG capsule TAKE 1 CAPSULE BY MOUTH TWICE A DAY. PATIENT NEEDS FOLLOW UP APPOINTMENT FOR  FUTURE REFILLS. PLEASE CALL [phone] TO SCHEDULE AN APPOINTMENT. 180 capsule 0   ondansetron (ZOFRAN-ODT) 4 MG disintegrating tablet Take  1 tablet (4 mg total) by mouth every 8 (eight) hours as needed for nausea or vomiting. 20 tablet 0   REXULTI 1 MG TABS tablet Take 1 mg by mouth daily.     rOPINIRole (REQUIP) 1 MG tablet Take 1 tablet (1 mg total) by mouth at bedtime. TAKE 1 TABLET BY MOUTH AT BEDTIME 90 tablet 3   sertraline (ZOLOFT) 25 MG tablet Take 50 mg by mouth daily.     Tiotropium Bromide Monohydrate (SPIRIVA RESPIMAT) 2.5 MCG/ACT AERS Inhale 2 puffs into the lungs daily at 2 PM. 4 g 2   atorvastatin (LIPITOR) 10 MG tablet Take 1 tablet (10 mg total) by mouth at bedtime. (Patient not taking: Reported on 05/28/2023) 90 tablet 1   Blood Glucose Monitoring Suppl (ACCU-CHEK GUIDE ME) w/Device KIT Use to check blood sugar daily (Patient not taking: Reported on 05/28/2023) 1 kit 0   cetirizine (ZYRTEC) 10 MG tablet Take 1 tablet (10 mg total) by mouth daily. (Patient not taking: Reported on 05/28/2023) 30 tablet 11   Continuous Glucose Sensor (DEXCOM G6 SENSOR) MISC 1 each by Does not apply route See admin instructions. Change sensor every 10 days; E10.9 (Patient not taking: Reported on 05/28/2023) 6 each 1   Continuous Glucose Transmitter (DEXCOM G6 TRANSMITTER) MISC Use as instructed to check blood sugars, change every 3 months. (Patient not taking: Reported on 05/28/2023) 1 each 3   diclofenac (VOLTAREN) 75 MG EC tablet  (Patient not taking: Reported on 01/09/2023)     fluticasone (FLONASE) 50 MCG/ACT nasal spray Place 2 sprays into both nostrils daily. (Patient not taking: Reported on 01/09/2023) 16 g 6   HYDROcodone-acetaminophen (NORCO) 5-325 MG tablet Take 1 tablet by mouth every 8 (eight) hours as needed for moderate pain. (Patient not taking: Reported on 01/09/2023) 15 tablet 0   ipratropium (ATROVENT HFA) 17 MCG/ACT inhaler Inhale 2 puffs into the lungs every 4 (four) hours as needed for wheezing. (Patient not taking: Reported on 05/28/2023) 1 each 3   methocarbamol (ROBAXIN) 500 MG tablet Take 1 tablet (500 mg total) by  mouth every 8 (eight) hours as needed for muscle spasms. Be careful, this can make you sleepy. (Patient not taking: Reported on 01/09/2023) 60 tablet 0   oxyCODONE-acetaminophen (PERCOCET) 5-325 MG tablet Take 1 tablet by mouth every 4 (four) hours as needed for severe pain. (Patient not taking: Reported on 05/28/2023) 15 tablet 0   traZODone (DESYREL) 100 MG tablet PLEASE SEE ATTACHED FOR DETAILED DIRECTIONS (Patient not taking: Reported on 05/28/2023)     VENTOLIN HFA 108 (90 Base) MCG/ACT inhaler  (Patient not taking: Reported on 01/09/2023)     No current facility-administered medications for this visit.    PHYSICAL EXAM: Vitals:   05/28/23 1532  BP: 120/70  Pulse: 91  Weight: 121 lb 3.2 oz (55 kg)  Height: 5\' 4"  (1.626 m)   Body mass index is 20.8 kg/m.  Wt Readings from Last 3 Encounters:  05/28/23 121 lb 3.2 oz (55 kg)  01/09/23 122 lb 3.2 oz (55.4 kg)  11/02/22 126 lb 12.8 oz (57.5 kg)    General: Well developed, well nourished female in no apparent distress.  HEENT: AT/[place], no external lesions.  Eyes: Conjunctiva clear and no icterus. Neck: Neck supple  Lungs: Respirations not labored Neurologic: Alert, oriented, normal speech Extremities / Skin: Dry. No sores or rashes noted.  No acanthosis nigricans Psychiatric: Does not appear depressed or anxious  Diabetic Foot Exam - Simple   No data filed     LABS Reviewed Lab Results  Component Value Date   HGBA1C 9.4 (A) 01/09/2023   HGBA1C 9.7 (H) 01/08/2023   HGBA1C 9.4 (H) 04/17/2022   No results found for: "FRUCTOSAMINE" Lab Results  Component Value Date   CHOL 220 (H) 05/12/2010   HDL 49.90 05/12/2010   LDLDIRECT 142.7 05/12/2010   TRIG 200.0 (H) 05/12/2010   CHOLHDL 4 05/12/2010   Lab Results  Component Value Date   MICRALBCREAT 10.5 01/08/2023   MICRALBCREAT 7.1 12/31/2015   Lab Results  Component Value Date   CREATININE 0.62 05/26/2023   Lab Results  Component Value Date   GFR 97.31 04/17/2022     ASSESSMENT / PLAN  1. Uncontrolled type 1 diabetes mellitus with hyperglycemia (HCC)     Diabetes Mellitus type 1, complicated by not clear about complication at this time, probably diabetic retinopathy. - Diabetic status / severity: Uncontrolled.  Lab Results  Component Value Date   HGBA1C 9.4 (A) 01/09/2023    - Hemoglobin A1c goal <7%   Patient is mainly having hyperglycemia with uncontrolled blood sugar.  He is mainly due to inadequate meal bolus.  She is also not on control IQ more with Dexcom G7.  Advised to use meal bolus this time she eats.  Discussed about compliance with insulin.  - Medications:  Insulin pump setting changed as follows: Insulin Pump setting:  Basal MN- 0.1u/hour  Bolus CHO Ratio (1unit:CHO) MN- 1:1, she has been mainly correcting manually with insulin dose of 0.5 to 4 units at a time, without carb counting.  Correction/Sensitivity: MN- 1:600, changed to 1:400  Target: 150  Active insulin time: 5 hours  Ask patient to call tandem to upgrade her current pump to be compatible with Dexcom G7.  Will check hemoglobin A1c in the clinic.  - Home glucose testing: continue CGM and check blood glucose as needed.  - Discussed/ Gave Hypoglycemia treatment plan.  # Consult : not required at this time.   # Annual urine for microalbuminuria/ creatinine ratio, no microalbuminuria currently. Last  Lab Results  Component Value Date   MICRALBCREAT 10.5 01/08/2023    # Foot check nightly.  # Annual dilated diabetic eye exams.   - Diet: Make healthy diabetic food choices - Life style / activity / exercise: Discussed.  2. Blood pressure  -  BP Readings from Last 1 Encounters:  05/28/23 120/70    - Control is in target.  - No change in current plans.  3. Lipid status / Hyperlipidemia - Last No results found for: "LDLCALC" - Continue atorvastatin 10 mg daily managed by primary care provider.  Diagnoses and all orders for this  visit:  Uncontrolled type 1 diabetes mellitus with hyperglycemia (HCC)    DISPOSITION Follow up in clinic in 6 weeks suggested.   All questions answered and patient verbalized understanding of the plan.  Alejandra Muskan Bolla, MD [facility] Endocrinology [facility] [address] [place], [address] [place], [place] [address] Phone # [phone]  At least part of this note was generated using voice recognition software. Inadvertent word errors may have occurred, which were not recognized during the proofreading process.

## 2023-05-29 ENCOUNTER — Ambulatory Visit: Payer: MEDICAID | Attending: Orthopedic Surgery

## 2023-05-29 ENCOUNTER — Telehealth: Payer: Self-pay | Admitting: Orthopedic Surgery

## 2023-05-29 ENCOUNTER — Encounter: Payer: Self-pay | Admitting: Endocrinology

## 2023-05-29 DIAGNOSIS — R262 Difficulty in walking, not elsewhere classified: Secondary | ICD-10-CM | POA: Diagnosis present

## 2023-05-29 DIAGNOSIS — M5459 Other low back pain: Secondary | ICD-10-CM | POA: Diagnosis present

## 2023-05-29 DIAGNOSIS — M6281 Muscle weakness (generalized): Secondary | ICD-10-CM | POA: Diagnosis present

## 2023-05-29 NOTE — Telephone Encounter (Signed)
Message from PT that they discharged her from land PT and recommended aquatic PT at [facility].   Does she want me to order the aquatic PT?   She missed her f/u with me. If she wants to do aquatic PT, then I would have her f/u with me after the first of the year.   Thanks!

## 2023-05-29 NOTE — Telephone Encounter (Signed)
Left voicemail to call the office.

## 2023-05-29 NOTE — Therapy (Signed)
OUTPATIENT PHYSICAL THERAPY THORACOLUMBAR TREATMENT   Patient Name: [name] MRN: [id number] DOB:11/12/1975, 47 y.o., female Today's Date: 05/29/2023  END OF SESSION:  PT End of Session - 05/29/23 1510     Visit Number 6    Number of Visits 17    Date for PT Re-Evaluation 04/26/23    PT Start Time 1512    PT Stop Time 1530    PT Time Calculation (min) 18 min    Activity Tolerance Patient tolerated treatment well    Behavior During Therapy [facility] for tasks assessed/performed             Past Medical History:  Diagnosis Date   Absence of bladder continence 04/01/2010   Overview:   Urinary Loss Of Control   Acid reflux 04/01/2010   Overview:    Acute gastroenteritis 03/14/2022   Anxiety state, unspecified 03/26/2009   Arthralgia 08/14/2018   ASTHMA 02/02/2007   Asthma    Chest wall pain 08/02/2018   DEPRESSION 02/16/2009   DIABETES MELLITUS, TYPE I 02/02/2007   Dizziness 09/18/2018   Eczema 08/02/2018   Elevated cholesterol 06/02/2022   Encounter to establish care 06/02/2022   Endometriosis    Essential hypertension    GERD 12/21/2008   Herpes    at 18   Hyperlipidemia    Hypertension 03/03/2020   Lesion of vocal cord    Mild intermittent asthma without complication 08/12/2018   Osteoarthritis    Peripheral polyneuropathy 11/29/2017   Primary osteoarthritis of both feet 11/30/2019   Primary osteoarthritis of both hands 11/30/2019   Bilateral mild osteoarthritis.   Retinal hemorrhage of right eye 11/04/2019   Severe nonproliferative diabetic retinopathy of right eye, without macular edema, associated with type 1 diabetes mellitus (HCC) 11/04/2019   Shortness of breath 09/25/2007   Uncontrolled type 1 diabetes mellitus with hyperglycemia, with long-term current use of insulin (HCC) 03/14/2022          Uncontrolled type 2 diabetes mellitus with hyperglycemia, with long-term current use of insulin (HCC) 03/14/2022   Vitreous hemorrhage of right eye  (HCC) 08/16/2021   Vocal fold leukoplakia    Past Surgical History:  Procedure Laterality Date   ABDOMINAL HYSTERECTOMY  2009   BSO   DIRECT LARYNGOSCOPY N/A 08/17/2017   Procedure: DIRECT LARYNGOSCOPY WITH OPERATING TELESCOPE WITH BIOPSY;  Surgeon: Graylin Shiver, MD;  Location: [facility] OR;  Service: ENT;  Laterality: N/A;   NASOPHARYNGEAL BIOPSY Right 08/17/2017   Procedure: NASOPHARYNGEAL BIOPSY RIGHT NASAL LESION;  Surgeon: Graylin Shiver, MD;  Location: [facility] OR;  Service: ENT;  Laterality: Right;   Patient Active Problem List   Diagnosis Date Noted   Emphysema lung (HCC) 08/19/2022   Gastroparesis due to DM (HCC) 08/19/2022   Benzodiazepine dependence (HCC) 08/19/2022   Hypertension associated with diabetes (HCC) 08/19/2022   Cough 08/19/2022   Aortic atherosclerosis (HCC) 08/19/2022   Diabetes mellitus type I (HCC) 06/02/2022   Hyponatremia 03/14/2022   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 associated with type 1 diabetes mellitus (HCC) 08/16/2021   Primary osteoarthritis of both knees 11/30/2019   Severe nonproliferative diabetic retinopathy of right eye, without macular edema, associated with type 1 diabetes mellitus (HCC) 11/04/2019   Severe nonproliferative diabetic retinopathy of left eye, without macular edema, associated with type 1 diabetes mellitus (HCC) 11/04/2019   Chronic pain syndrome 11/13/2018   Snoring 08/12/2018   Breast lump 08/12/2018   Lung nodule 08/12/2018   Diabetic peripheral neuropathy (HCC) 01/11/2018  Low back pain with left-sided sciatica 11/29/2017   Multinodular goiter 07/29/2015   Restless leg 11/07/2013   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   Cholesteatoma 09/11/2011   Allergy to environmental factors 09/11/2011   Anxiety 05/01/2011   INSOMNIA 02/18/2010   GERD 12/21/2008   SMOKER 09/25/2007    PCP: Dana Allan MD  REFERRING PROVIDER: Drake Leach PA-C  REFERRING DIAG: [phone] (ICD-10-CM) -  Spondylolisthesis of lumbar region M51.36 (ICD-10-CM) - DDD (degenerative disc disease), lumbar M54.16 (ICD-10-CM) - Lumbar radiculopathy G62.9 (ICD-10-CM) - Neuropathy  Rationale for Evaluation and Treatment: Rehabilitation  THERAPY DIAG:  Other low back pain  Muscle weakness (generalized)  Difficulty in walking, not elsewhere classified  ONSET DATE: ~ 9-10 years ago   SUBJECTIVE:                                                                                                                                                                                           SUBJECTIVE STATEMENT: Pt reports not being able to get into PT due to appointment conflicts. Has been HEP compliant and still having significant LBP. Averaging 4/10 NPS in lower back daily but upwards of 9/10 NPS. Reports broken foot bone is slowly healing.    PERTINENT HISTORY:  Pt is a 47 y.o. female referred to OPPT for chronic LBP. Pain began ~10 years ago. Pain is upper lower back that refers in dermatomal pattern from buttocks then wrap around to anterior thighs. Pain has increased in severity in the past year where pt reports "I can't do anything anymore". Pain described as tightness, achey. LBP is sharp, dull, and achey. Reports her back feels like a "brick" or "block". Aggravating factors, include laying down on her back, standing, walking, bending over. Reports she has a hard time standing long enough to wash her dishes. Can complete short, community errands. Worst pain described as a 9/10 NPS. Best pain 3-4/10 NPS. Currently 6/10 NPS. Pain improved with rest, hook lying, and medications.  PAIN:  Are you having pain? Yes: NPRS scale: 4/10 Pain location: Lower back paraspinals Pain description: achey, sharp Aggravating factors: bending over, walking, standing, sleeping Relieving factors: medication  PRECAUTIONS: None  WEIGHT BEARING RESTRICTIONS: No  FALLS:  Has patient fallen in last 6 months? No   OCCUPATION:  Does not work   PLOF: Independent  PATIENT GOALS: Improve her pain   NEXT MD VISIT: After PT   OBJECTIVE:   DIAGNOSTIC FINDINGS:  She has known slip at L5-S1 with pars at L5 along with severe DDD and bilateral moderate/severe foraminal stenosis  PATIENT SURVEYS:  FOTO Deferred to next session  SCREENING FOR RED  FLAGS: Bowel or bladder incontinence: No Spinal tumors: No Cauda equina syndrome: No Compression fracture: No Abdominal aneurysm: No  COGNITION: Overall cognitive status: Within functional limits for tasks assessed     SENSATION: Light touch: WFL  MUSCLE LENGTH: Hamstrings SLR: Right  10 deg; Left 12  deg Thomas test: Right 15 deg; Left 15 deg  POSTURE:  rounded shoulders, forward head, and decreased lumbar lordosis L hip hike in standing   PALPATION: TTP along B upper glut meds, B paraspinals from S1 to T8 (bra strap)   LUMBAR ROM:   AROM eval  Flexion 50%*  Extension 25%*  Right lateral flexion 50%*  Left lateral flexion 50%*  Right rotation 75%*  Left rotation 75%*   (Blank rows = not tested)  LOWER EXTREMITY ROM:     Active  Right eval Left eval  Hip flexion    Hip extension    Hip abduction    Hip adduction    Hip internal rotation    Hip external rotation    Knee flexion    Knee extension    Ankle dorsiflexion    Ankle plantarflexion    Ankle inversion    Ankle eversion     (Blank rows = not tested)  LOWER EXTREMITY MMT:    MMT Right eval Left eval  Hip flexion 3+ 3+  Hip extension    Hip abduction 4 4  Hip adduction    Hip internal rotation 4 4  Hip external rotation 4 4  Knee flexion 4 4  Knee extension 5 4  Ankle dorsiflexion 5 5  Ankle plantarflexion 5 5  Ankle inversion    Ankle eversion     (Blank rows = not tested)  LUMBAR SPECIAL TESTS:  Straight leg raise test: Negative, Slump test: Negative, and FABER test: Positive  FADDIR: Negative bilat  FUNCTIONAL TESTS:  5 times sit to stand: 21.43 seconds    JOINT MOBILITY: Lumbar CPA's and UPA's hypomobile and painful L5-T12  TODAY'S TREATMENT:                                                                                                                              DATE: 05/29/23  There.ex:   Reviewed all STG's and LTG's. Discussion on limited to no progress made with land based PT despite regular home HEP compliance. Discussion on possible benefits of aquatic based PT due to known spine pathology and no progress made with land based therapy. Pt in agreement with plan to discharge today and receive aquatic referral. See goals section and clinical impression for objective data.   PATIENT EDUCATION:  Education details: possible benefits of aquatic PT referral Person educated: Patient Education method: Explanation and Handouts Education comprehension: verbalized understanding and needs further education  HOME EXERCISE PROGRAM: Access Code: [email or web address] URL: [email or web address] Date: 03/15/2023 Prepared by: Ronnie Derby  Exercises - Seated Gluteal Sets  - 1 x daily - 5 x weekly - 3 sets -  10 reps - 5 hold - Scapular Retraction with Resistance  - 1 x daily - 5 x weekly - 2 sets - 12 reps - Seated Anti-Rotation Press With Anchored Resistance  - 1 x daily - 5 x weekly - 2 sets - 12 reps - Seated Hamstring Stretch with Strap  - 7 x weekly - 1 sets - 2-3 reps - 30 hold   Access Code: [email or web address] URL: [email or web address] Date: 02/05/2023 Prepared by: Ronnie Derby  Exercises - Sit to Stand Without Arm Support  - 1 x daily - 7 x weekly - 3 sets - 8 reps - Seated Hamstring Stretch  - 1 x daily - 7 x weekly - 1 sets - 3 reps - 10 hold - Seated Gluteal Sets  - 1 x daily - 7 x weekly - 3 sets - 10 reps - 5 hold - Standing Quadriceps Stretch  - 1 x daily - 7 x weekly - 1 sets - 3 reps - 10 hold  Access Code: [email or web address] URL: [email or web address] Date: 01/22/2023 Prepared by: Ronnie Derby  Exercises - Sit to Stand Without Arm Support  - 1 x daily - 7 x weekly - 3 sets - 8 reps - Supine Gluteal Sets  - 1 x daily - 7 x weekly - 3 sets - 10 reps - 5 hold - Seated Hamstring Stretch  - 1 x daily - 7 x weekly - 1 sets - 3 reps - 10 hold - Prone Lying with Towel Roll (Hip Flexor Stretch) (BKA)  - 1 x daily - 7 x weekly - 1 sets - 3 reps - 10 hold  Access Code: [email or web address] URL: [email or web address] Date: 01/16/2023 Prepared by: Ronnie Derby  Exercises - Sit to Stand Without Arm Support  - 1 x daily - 7 x weekly - 3 sets - 8 reps - Supine Gluteal Sets  - 1 x daily - 7 x weekly - 3 sets - 10 reps - 5 hold - Supine Double Knee to Chest  - 1 x daily - 7 x weekly - 2 sets - 8 reps - 10 hold  ASSESSMENT:  CLINICAL IMPRESSION:  Pt well past end of current POC. Session spent reviewing goals with pt making little to no progress. She was able to ambulate further as evidenced by but this is likely to CAM boot being donned from prior assessment. However she is unable to complete full due to onset of 8.5-9/10 LBP requesting to discontinue. Pt remains with upwards to 8-9/10 LBP with home and community ADL's and also scoring approximately the same 5xSTS indicative of no LE strength gains.  Discussion with pt made on her being best suited with attempting aquatic PT due to off loading nature due to her known spine pathology and difficulty completing land based therapy. Pt in agreement with plan and discharged from PT at this time.   OBJECTIVE IMPAIRMENTS: decreased mobility, difficulty walking, decreased ROM, decreased strength, hypomobility, impaired flexibility, and pain.   ACTIVITY LIMITATIONS: lifting, bending, sitting, standing, squatting, and sleeping  PARTICIPATION LIMITATIONS: cleaning, laundry, community activity, occupation, and yard work  PERSONAL FACTORS: Age, Past/current experiences, Time since onset of injury/illness/exacerbation, and 3+ comorbidities:  DM type 2, HTN, HLD, OA, peripheral polyneuropathy   are also affecting patient's functional outcome.   REHAB POTENTIAL: Poor Chronicity of symptoms, PMH, smoking history  CLINICAL DECISION MAKING: Evolving/moderate complexity  EVALUATION COMPLEXITY: Moderate   GOALS: Goals reviewed with patient? No  SHORT TERM GOALS: Target date: 04/05/23  Pt  will be independent with HEP to improve pain, Lumbar AROM, and LE strength to maximize functional capacity.  Baseline: 01/16/23: given HEP; 03/15/23: Not compliant since ankle fracture on 02/10/23; HEP compliant  Goal status: MET  LONG TERM GOALS: Target date: 04/26/23  Pt will improve FOTO to target score to demonstrate clinically significant improvement in functional mobility. Baseline: 01/16/23: deferred to next session; 01/22/23: 19/36; 03/15/23: 36/36 Goal status: MET  2.  Pt will improve 5xSTS to 12 seconds or less to demonstrate clinically significant improvement in LE strength to assist in tolerance for standing and walking ADL's Baseline: 01/16/23: 21.43 seconds; 03/15/23: deferred due to next session; 05/29/23: 21.05 seconds Goal status: NOT MET  3.  Pt will improve 6 MWT by 165' to demonstrate clinically significant improvement in community walking distances. Baseline: 01/16/23: deferred to next session; 03/15/23: deferred. Pt with fractured fibula in walking boot. Completed 2 MWT: 330' up to 8/10 NPS.; 05/29/23: 900' in 5.5 minutes. Had to discontinue test due to LBP 9/10 NPS. Completed 2 MWT in 500' Goal status: PARTIALLY MET.   4.  Pt will report worst pain as a 6/10 NPS with functional tasks to demonstrate clinically significant improvement in functional mobility  Baseline: 01/16/23: worst pain 9/10 NPS; 03/15/23: 9-10/10 NPS.; 05/29/23: upwards to 9/10 pain.  Goal status: NOT MET   PLAN:  PT FREQUENCY: 1x/week  PT DURATION: 1 week  PLANNED INTERVENTIONS: Therapeutic exercises, Therapeutic activity, Neuromuscular re-education, Balance  training, Gait training, Patient/Family education, Self Care, Joint mobilization, Joint manipulation, Aquatic Therapy, Dry Needling, Electrical stimulation, Spinal manipulation, Spinal mobilization, Cryotherapy, Moist heat, Manual therapy, and Re-evaluation.  PLAN FOR NEXT SESSION: N/A    Delphia Grates. Fairly IV, PT, DPT Physical Therapist- [place]  [facility]  05/29/2023, 3:33 PM

## 2023-05-30 NOTE — Telephone Encounter (Signed)
LMOM for patient to return call.

## 2023-05-30 NOTE — Telephone Encounter (Signed)
She said that she will try aquatic therapy only if you think it will help. She feels it's not going to help.

## 2023-05-30 NOTE — Telephone Encounter (Signed)
She does not have to do aquatic PT if she does not want to do it. It may help, but we would have to try it to see.   If she is still interested in surgery for her back, she would need to quit smoking and her HgbA1c would need to be 7.5 or lower (last in June was 9.4).   I am happy to see her back to discuss these things or she can make f/u once labs look better/she's quit smoking.

## 2023-05-31 ENCOUNTER — Encounter: Payer: Self-pay | Admitting: Endocrinology

## 2023-05-31 NOTE — Telephone Encounter (Signed)
Patient notified and voiced understanding.

## 2023-06-04 ENCOUNTER — Encounter: Payer: Self-pay | Admitting: Endocrinology

## 2023-06-05 ENCOUNTER — Ambulatory Visit: Payer: MEDICAID

## 2023-06-11 ENCOUNTER — Other Ambulatory Visit: Payer: Self-pay

## 2023-06-11 ENCOUNTER — Telehealth: Payer: Self-pay | Admitting: Endocrinology

## 2023-06-11 DIAGNOSIS — E1065 Type 1 diabetes mellitus with hyperglycemia: Secondary | ICD-10-CM

## 2023-06-11 MED ORDER — DEXCOM G7 SENSOR MISC
3 refills | Status: DC
Start: 2023-06-11 — End: 2023-11-15

## 2023-06-11 NOTE — Telephone Encounter (Signed)
Patient advised that she sent a message in my chart about being out of her Dexcom G7 Sensors  and states she is ou. Please send to [facility]. And advise patient when done.

## 2023-06-18 ENCOUNTER — Telehealth: Payer: Self-pay | Admitting: Orthopedic Surgery

## 2023-06-18 NOTE — Telephone Encounter (Signed)
Patient is calling to request something for pain. She states that she is trying to get her A1C under control, but due to the pain she's having it is causing her blood sugar to spike. Please advise.

## 2023-06-18 NOTE — Telephone Encounter (Signed)
Patient notified she will need to see Pain management. We have a referral placed but patient states she never got a call. Alexia Freestone will get her scheduled.

## 2023-06-19 NOTE — Telephone Encounter (Signed)
Referral faxed again to [facility].

## 2023-07-04 ENCOUNTER — Ambulatory Visit: Payer: MEDICAID | Admitting: Family Medicine

## 2023-07-16 ENCOUNTER — Ambulatory Visit: Payer: MEDICAID | Admitting: Endocrinology

## 2023-07-20 ENCOUNTER — Other Ambulatory Visit: Payer: Self-pay | Admitting: Nurse Practitioner

## 2023-08-02 ENCOUNTER — Ambulatory Visit: Payer: MEDICAID | Admitting: Family Medicine

## 2023-08-02 ENCOUNTER — Encounter: Payer: Self-pay | Admitting: Family Medicine

## 2023-08-02 VITALS — BP 132/82 | HR 82 | Temp 97.7°F | Ht 64.0 in | Wt 121.8 lb

## 2023-08-02 DIAGNOSIS — F39 Unspecified mood [affective] disorder: Secondary | ICD-10-CM

## 2023-08-02 MED ORDER — ALPRAZOLAM 0.5 MG PO TABS: 0.5000 mg | ORAL_TABLET | Freq: Two times a day (BID) | ORAL | 0 refills | Status: DC | PRN

## 2023-08-02 NOTE — Patient Instructions (Signed)
It was a pleasure meeting you today. Thank you for allowing me to take part in your health care.  Our goals for today as we discussed include:  Prescription sent for 8 tablets of Xanax  Can refer to psychiatry in closer area if preferred to continue management   This is a list of the screening recommended for you and due dates:  Health Maintenance  Topic Date Due   Pneumococcal Vaccination (1 of 2 - PCV) Never done   DTaP/Tdap/Td vaccine (2 - Tdap) 07/18/2009   Colon Cancer Screening  04/14/2021   Complete foot exam   01/06/2022   COVID-19 Vaccine (1 - 2024-25 season) Never done   Hemoglobin A1C  07/11/2023   Flu Shot  10/15/2023*   Eye exam for diabetics  09/19/2023   Yearly kidney health urinalysis for diabetes  01/08/2024   Yearly kidney function blood test for diabetes  05/25/2024   Pap with HPV screening  05/29/2024   Hepatitis C Screening  Completed   HIV Screening  Completed   HPV Vaccine  Aged Out  *Topic was postponed. The date shown is not the original due date.      If you have any questions or concerns, please do not hesitate to call the office at [phone].  I look forward to our next visit and until then take care and stay safe.  Regards,   Dana Allan, MD   [facility]
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
## 2023-08-09 NOTE — Telephone Encounter (Signed)
Spoke with pt and she was wanting to see if she could get a sooner appt because she is having increased anxiety and panic attacks. Pt has been scheduled to see Dr. Clent Ridges on Monday.

## 2023-08-09 NOTE — Telephone Encounter (Signed)
Copied from CRM [id number]. Topic: Appointments - Scheduling Inquiry for Clinic >> Aug 09, 2023  4:32 PM Taleah C wrote: Reason for CRM: pt called and stated that recently she has been having anxiety and panic attacks. She wanted to ask if Dr. Clent Ridges could squeeze her into an earlier appt other than feb 17th, her next available. She explained that she prefers to see her pcp only, if possible. Please call back and advise.

## 2023-08-13 ENCOUNTER — Ambulatory Visit: Payer: MEDICAID | Admitting: Family Medicine

## 2023-08-15 ENCOUNTER — Encounter: Payer: Self-pay | Admitting: Family Medicine

## 2023-08-15 DIAGNOSIS — F39 Unspecified mood [affective] disorder: Secondary | ICD-10-CM | POA: Insufficient documentation

## 2023-08-15 NOTE — Assessment & Plan Note (Addendum)
Currently on Zoloft 100mg , Rexulti 1mg , and Buspar 30mg  daily. Medications prescribed by a provider via telemedicine. Patient reports dissatisfaction with current regimen and wishes to discontinue some medications. PHQ 9/GAD 7 elevated.  MDQ screening positive. Denies SI/HI.  Recommend she discuss further evaluation with psychiatry for consideration of medication changes if not controlled currently.

## 2023-08-16 ENCOUNTER — Ambulatory Visit: Payer: MEDICAID | Admitting: Nurse Practitioner

## 2023-08-16 ENCOUNTER — Ambulatory Visit: Payer: MEDICAID | Admitting: Family Medicine

## 2023-08-16 ENCOUNTER — Encounter: Payer: Self-pay | Admitting: Nurse Practitioner

## 2023-08-16 VITALS — BP 134/86 | HR 79 | Temp 97.5°F | Ht 64.0 in | Wt 119.8 lb

## 2023-08-16 DIAGNOSIS — F39 Unspecified mood [affective] disorder: Secondary | ICD-10-CM

## 2023-08-16 DIAGNOSIS — F1320 Sedative, hypnotic or anxiolytic dependence, uncomplicated: Secondary | ICD-10-CM | POA: Diagnosis not present

## 2023-08-16 MED ORDER — ALPRAZOLAM 0.5 MG PO TABS: 0.5000 mg | ORAL_TABLET | Freq: Two times a day (BID) | ORAL | 0 refills | Status: DC | PRN

## 2023-08-16 NOTE — Assessment & Plan Note (Signed)
Abrupt cessation of Xanax leading to severe withdrawal symptoms including anxiety, sweating, and panic attacks.  -Provide a 10-day supply of Xanax. -Schedule follow-up appointment with PCP. -Urgent referral to a psychiatrist for further management.

## 2023-08-16 NOTE — Assessment & Plan Note (Addendum)
Patient is currently on Zoloft, Rexulti, and Buspar for anxiety management. She is out of Xanax. -Continue current regimen. -Referral sent to psychiatrist for further management.

## 2023-08-16 NOTE — Patient Instructions (Addendum)
During today's visit, we discussed your withdrawal symptoms after stopping Xanax, your chronic pain management, and your ongoing anxiety. We have developed a plan to address each of these issues to help you feel better and manage your symptoms effectively.  YOUR PLAN:  -BENZODIAZEPINE WITHDRAWAL: Benzodiazepine withdrawal occurs when stopping a medication like Xanax too quickly, leading to symptoms such as anxiety, sweating, and panic attacks. We will provide you with a 10-day supply of Xanax to help manage these symptoms and schedule a follow-up appointment with Dr. Clent Ridges. Additionally, we will urgently refer you to a psychiatrist for further management.  -ANXIETY: Anxiety is a feeling of worry or fear that can be persistent and overwhelming. You are currently taking Zoloft, Rexulti, and Buspar for anxiety management. We will continue your current regimen and ensure you follow up with a psychiatrist for further management.  INSTRUCTIONS:  Please follow up with Dr. Clent Ridges as scheduled and attend the urgent referral to a psychiatrist for further management of your withdrawal symptoms and anxiety.
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
## 2023-08-23 ENCOUNTER — Encounter: Payer: Self-pay | Admitting: Family Medicine

## 2023-08-23 ENCOUNTER — Other Ambulatory Visit: Payer: Self-pay | Admitting: Family Medicine

## 2023-08-23 ENCOUNTER — Ambulatory Visit: Payer: MEDICAID | Admitting: Family Medicine

## 2023-08-23 VITALS — BP 136/70 | HR 99 | Temp 98.2°F | Resp 18 | Ht 64.0 in | Wt 120.0 lb

## 2023-08-23 DIAGNOSIS — G2581 Restless legs syndrome: Secondary | ICD-10-CM

## 2023-08-23 DIAGNOSIS — F39 Unspecified mood [affective] disorder: Secondary | ICD-10-CM

## 2023-08-23 MED ORDER — ALPRAZOLAM 0.5 MG PO TABS: 0.5000 mg | ORAL_TABLET | Freq: Two times a day (BID) | ORAL | 0 refills | Status: DC | PRN

## 2023-08-23 NOTE — Progress Notes (Signed)
 SUBJECTIVE:   Chief Complaint  Patient presents with   Anxiety   HPI Presents for follow up mood disorder  Discussed the use of AI scribe software for clinical note transcription with the patient, who gave verbal consent to proceed.  History of Present Illness [name] is a 48 year old female who presents with medication management issues after discontinuing Xanax . She is awaiting a referral to a new psychiatrist at [facility], as her previous psychiatrist, Leita Salmons from [facility] in [place], has not provided further guidance or diagnosis.  She has been experiencing significant difficulties after discontinuing Xanax , which she had been taking at a dose of 1.5 mg per day. Her psychiatrist advised her to stop the medication abruptly, leading to severe withdrawal symptoms. She describes feeling 'a mess' and experiencing frequent panic attacks, stating that she was unable to manage without the medication. She has since resumed taking Xanax  at a dose of 0.5 mg twice a day, which is helping her feel better.  She is currently taking several medications for her mental health conditions, including Rexulti  at 1 mg daily, Zoloft  at 100 mg daily, and Buspar  at 15 mg three times a day. She mentions that the [facility] app is not accurately reflecting her medication list, which has caused some confusion.  She has a history of anxiety and depression, which have been exacerbated since the passing of her husband. She has not been formally diagnosed with bipolar disorder, but there is a concern about potential underlying bipolar symptoms. She describes her anxiety as 'through the roof' and is seeking clarity on her mental health diagnosis.      PERTINENT PMH / PSH: As above  OBJECTIVE:  BP 136/70   Pulse 99   Temp 98.2 F (36.8 C)   Resp 18   Ht 5' 4 (1.626 m)   Wt 120 lb (54.4 kg)   SpO2 99%   BMI 20.60 kg/m    Physical Exam Vitals reviewed.  Constitutional:       General: She is not in acute distress.    Appearance: Normal appearance. She is normal weight. She is not ill-appearing, toxic-appearing or diaphoretic.  Eyes:     General:        Right eye: No discharge.        Left eye: No discharge.     Conjunctiva/sclera: Conjunctivae normal.  Cardiovascular:     Rate and Rhythm: Normal rate.  Pulmonary:     Effort: Pulmonary effort is normal.  Musculoskeletal:        General: Normal range of motion.  Skin:    General: Skin is warm and dry.  Neurological:     General: No focal deficit present.     Mental Status: She is alert and oriented to person, place, and time. Mental status is at baseline.  Psychiatric:        Attention and Perception: Attention normal.        Mood and Affect: Mood is anxious.        Speech: Speech normal.        Behavior: Behavior normal.        Thought Content: Thought content normal.        Judgment: Judgment normal.           08/23/2023   11:53 AM 08/16/2023   11:03 AM 08/02/2023   11:21 AM 08/17/2022    9:16 AM 05/31/2022    2:28 PM  Depression screen PHQ 2/9  Decreased Interest 1 2 2  0 1  Down, Depressed, Hopeless 1 3 2  0 2  PHQ - 2 Score 2 5 4  0 3  Altered sleeping 0 3 2 0 1  Tired, decreased energy 0 3 1 0 1  Change in appetite 0 3 2 0 1  Feeling bad or failure about yourself  0 3 1 0 1  Trouble concentrating 0 3 1 0 1  Moving slowly or fidgety/restless 0 2 1 0 1  Suicidal thoughts 0 0 0 0 0  PHQ-9 Score 2 22 12  0 9  Difficult doing work/chores Not difficult at all Very difficult  Not difficult at all Not difficult at all      08/23/2023   11:53 AM 08/16/2023   11:04 AM 08/02/2023   11:22 AM 08/17/2022    9:17 AM  GAD 7 : Generalized Anxiety Score  Nervous, Anxious, on Edge 1 3 3  0  Control/stop worrying 1 3 3  0  Worry too much - different things 0 3 3 0  Trouble relaxing 1 3 3  0  Restless 0 3 3 0  Easily annoyed or irritable 0 3 3 0  Afraid - awful might happen 1 3 3  0  Total GAD 7 Score 4 21 21   0  Anxiety Difficulty Not difficult at all Very difficult Very difficult Not difficult at all    ASSESSMENT/PLAN:  Mood disorder [facility]) Assessment & Plan: Patient reports frequent panic attacks and is currently on Xanax  0.5mg  twice daily, which she reports as helpful. However, concerns about the short-acting nature of Xanax  and potential for withdrawal symptoms. Discussed the possibility of transitioning to a longer-acting benzodiazepine, Klonopin , for more sustained control of anxiety symptoms. Patient is hesitant about changing medication at this time.Patient is on Buspar  15mg  three times daily.  Patient is on Zoloft  100mg  daily and Rexulti  1mg  daily. No specific concerns about depressive symptoms were discussed in this visit. -Continue Xanax  0.5mg  twice daily. -Plan to reassess in 3 weeks. If psychiatry appointment has not occurred by then, consider initiating a contract for continued Xanax  prescribing or discuss transition to Klonopin  again. -Continue Zoloft  100mg  daily and Rexulti  1mg  daily. -Continue Buspar  15mg  three times daily. Patient is awaiting a psychiatry appointment for further evaluation and management of her anxiety and depression. -Encourage patient to attend scheduled psychiatry appointment when it becomes available.   Orders: -     ALPRAZolam ; Take 1 tablet (0.5 mg total) by mouth 2 (two) times daily as needed for anxiety.  Dispense: 60 tablet; Refill: 0    PDMP reviewed  Return in about 4 weeks (around 09/20/2023) for PCP, Mood.  Glenys Ferrari, MD

## 2023-08-23 NOTE — Assessment & Plan Note (Addendum)
 Patient reports frequent panic attacks and is currently on Xanax  0.5mg  twice daily, which she reports as helpful. However, concerns about the short-acting nature of Xanax  and potential for withdrawal symptoms. Discussed the possibility of transitioning to a longer-acting benzodiazepine, Klonopin , for more sustained control of anxiety symptoms. Patient is hesitant about changing medication at this time.Patient is on Buspar  15mg  three times daily.  Patient is on Zoloft  100mg  daily and Rexulti  1mg  daily. No specific concerns about depressive symptoms were discussed in this visit. -Continue Xanax  0.5mg  twice daily. -Plan to reassess in 3 weeks. If psychiatry appointment has not occurred by then, consider initiating a contract for continued Xanax  prescribing or discuss transition to Klonopin  again. -Continue Zoloft  100mg  daily and Rexulti  1mg  daily. -Continue Buspar  15mg  three times daily. Patient is awaiting a psychiatry appointment for further evaluation and management of her anxiety and depression. -Encourage patient to attend scheduled psychiatry appointment when it becomes available.

## 2023-08-23 NOTE — Patient Instructions (Addendum)
 It was a pleasure meeting you today. Thank you for allowing me to take part in your health care.  Our goals for today as we discussed include:  Refill sent for Xanax  0.5 mg  1 table 2 times a day as needed for anxiety  Would like to switch to Klonopin  in the future for better control of anxiety  Continue current medication  Follow up in 3-4 weeks   This is a list of the screening recommended for you and due dates:  Health Maintenance  Topic Date Due   DTaP/Tdap/Td vaccine (2 - Tdap) 07/18/2009   Colon Cancer Screening  04/14/2021   Complete foot exam   01/06/2022   Hemoglobin A1C  07/11/2023   COVID-19 Vaccine (1 - 2024-25 season) 08/31/2023*   Flu Shot  10/15/2023*   Pneumococcal Vaccination (1 of 2 - PCV) 08/22/2024*   Eye exam for diabetics  09/19/2023   Yearly kidney health urinalysis for diabetes  01/08/2024   Yearly kidney function blood test for diabetes  05/25/2024   Pap with HPV screening  05/29/2024   Hepatitis C Screening  Completed   HIV Screening  Completed   HPV Vaccine  Aged Out  *Topic was postponed. The date shown is not the original due date.    If you have any questions or concerns, please do not hesitate to call the office at [phone].  I look forward to our next visit and until then take care and stay safe.  Regards,   Glenys Ferrari, MD   [facility]

## 2023-08-28 ENCOUNTER — Other Ambulatory Visit (HOSPITAL_BASED_OUTPATIENT_CLINIC_OR_DEPARTMENT_OTHER): Payer: Self-pay | Admitting: Pulmonary Disease

## 2023-08-28 ENCOUNTER — Telehealth: Payer: Self-pay | Admitting: Orthopedic Surgery

## 2023-08-28 ENCOUNTER — Other Ambulatory Visit: Payer: Self-pay | Admitting: Family Medicine

## 2023-08-28 DIAGNOSIS — I10 Essential (primary) hypertension: Secondary | ICD-10-CM

## 2023-08-28 DIAGNOSIS — E7800 Pure hypercholesterolemia, unspecified: Secondary | ICD-10-CM

## 2023-08-28 MED ORDER — ATORVASTATIN CALCIUM 10 MG PO TABS: 10.0000 mg | ORAL_TABLET | Freq: Every day | ORAL | 1 refills | Status: DC

## 2023-08-28 NOTE — Telephone Encounter (Signed)
Patient is calling to let our office know that she is still working on getting her A1C down. She states that when she had it checked about three weeks ago it was 8.6.

## 2023-08-28 NOTE — Telephone Encounter (Signed)
Copied from CRM [id number]. Topic: Clinical - Medication Refill >> Aug 28, 2023  9:30 AM Irine Seal wrote: Most Recent Primary Care Visit:  Provider: Dana Allan  Department: [facility]  Visit Type: ACUTE  Date: 08/23/2023  Medication: Tiotropium Bromide Monohydrate (SPIRIVA RESPIMAT) 2.5 MCG/ACT AERS atorvastatin (LIPITOR) 10 MG tablet  Has the patient contacted their pharmacy? Yes (Agent: If no, request that the patient contact the pharmacy for the refill. If patient does not wish to contact the pharmacy document the reason why and proceed with request.) (Agent: If yes, when and what did the pharmacy advise?)  Is this the correct pharmacy for this prescription? Yes If no, delete pharmacy and type the correct one.  This is the patient's preferred pharmacy:  [id number] [place], [place] - [address] [place] [address] Phone: [phone] Fax: [phone]  [id number] [place], [place] - [address] [place] [address] Phone: [phone] Fax: [phone]  [facility] (New Address) - [place], [place] - [address] Previously: [place], [place] [address] 4th Floor [address] [place] [address] Phone: [phone] Fax: [phone]  [id number] [place], [place] - [address] [place] [address] Phone: [phone] Fax: [phone]  [facility] [address], [place] - [address] [facility] [place] [address] Phone: [phone] Fax: [phone]   Has the prescription been filled recently? No  Is the patient out of the medication? Yes  Has the patient been seen for an appointment in the last year OR does the patient have an upcoming appointment? Yes  Can we respond through MyChart? Yes  Agent: Please be advised that Rx refills may take up to 3 business days. We ask that you follow-up with your  pharmacy.

## 2023-09-06 ENCOUNTER — Ambulatory Visit: Payer: Self-pay | Admitting: Family Medicine

## 2023-09-06 NOTE — Telephone Encounter (Signed)
Copied from CRM [id number]. Topic: Clinical - Medication Question >> Sep 06, 2023  2:53 PM Sim Boast F wrote: Reason for CRM: Patient is still struggling with ALPRAZolam 0.5MG , wants to know if she can up the dosage to 1 1/2MG   Chief Complaint: anxiety Symptoms: tense, restless, panicky, anxious, keyed up, overwhelmed & worried all the time Frequency: ongoing Pertinent Negatives: Patient denies impending doom or suicidal ideation/harm to others or self Disposition: [] ED /[] Urgent Care (no appt availability in office) / [] Appointment(In office/virtual)/ []  [facility]/ [] Home Care/ [] Refused Recommended Disposition /[] [facility]/ [x]  Follow-up with PCP Additional Notes: pt stated PCP instructed patient if medications/dose was not completely helping to call & aware her aware.  Pt stated if possible would like to increase dosage or  Instead of BID have same dosage TID in order for pt to be able to make it through the day. Reason for Disposition  [1] Symptoms of anxiety or panic attack AND [2] is a chronic symptom (recurrent or ongoing AND present > 4 weeks)  Answer Assessment - Initial Assessment Questions 1. CONCERN: "Did anything happen that prompted you to call today?"      Having anxiety the current dose you are on is not controlling the anxiety - wants to see if can increase dose overall per day 2. ANXIETY SYMPTOMS: "Can you describe how you (your loved one; patient) have been feeling?" (e.g., tense, restless, panicky, anxious, keyed up, overwhelmed, sense of impending doom).      tense, restless, panicky, anxious, keyed up, overwhelmed 3. ONSET: "How long have you been feeling this way?" (e.g., hours, days, weeks)     ongoing 4. SEVERITY: "How would you rate the level of anxiety?" (e.g., 0 - 10; or mild, moderate, severe).     Moderate  5. FUNCTIONAL IMPAIRMENT: "How have these feelings affected your ability to do daily activities?" "Have you had more difficulty than  usual doing your normal daily activities?" (e.g., getting better, same, worse; self-care, school, work, interactions)     Does affect daily task 6. HISTORY: "Have you felt this way before?" "Have you ever been diagnosed with an anxiety problem in the past?" (e.g., generalized anxiety disorder, panic attacks, PTSD). If Yes, ask: "How was this problem treated?" (e.g., medicines, counseling, etc.)     Yes - history of anxiety 7. RISK OF HARM - SUICIDAL IDEATION: "Do you ever have thoughts of hurting or killing yourself?" If Yes, ask:  "Do you have these feelings now?" "Do you have a plan on how you would do this?"     no 8. TREATMENT:  "What has been done so far to treat this anxiety?" (e.g., medicines, relaxation strategies). "What has helped?"     Medications 9. TREATMENT - THERAPIST: "Do you have a counselor or therapist? Name?"     No- PCP working on referral  10. POTENTIAL TRIGGERS: "Do you drink caffeinated beverages (e.g., coffee, colas, teas), and how much daily?" "Do you drink alcohol or use any drugs?" "Have you started any new medicines recently?"       Caffeine - pt has reduced amount per day. 11. PATIENT SUPPORT: "Who is with you now?" "Who do you live with?" "Do you have family or friends who you can talk to?"        N/a 12. OTHER SYMPTOMS: "Do you have any other symptoms?" (e.g., feeling depressed, trouble concentrating, trouble sleeping, trouble breathing, palpitations or fast heartbeat, chest pain, sweating, nausea, or diarrhea)  Anxiety about different things,worrying about different things  13. PREGNANCY: "Is there any chance you are pregnant?" "When was your last menstrual period?"       N/a  Protocols used: Anxiety and Panic Attack-A-AH

## 2023-09-07 NOTE — Telephone Encounter (Signed)
Called pt and gave her the information, and she stated that its nothing like that. She stated on her last visit that you wanted her to let you know if the medication was not enough to help her with. Pt said she will see [place] on the 27th.

## 2023-09-10 ENCOUNTER — Encounter: Payer: Self-pay | Admitting: Endocrinology

## 2023-09-11 ENCOUNTER — Other Ambulatory Visit: Payer: Self-pay

## 2023-09-11 NOTE — Telephone Encounter (Signed)
 Patient called and said she has no other way to check sugar without this at the moment and wanted to know if there is something that can be done in the meantime while we wait for PA. Please advise. [phone] is call back

## 2023-09-13 ENCOUNTER — Ambulatory Visit: Payer: MEDICAID | Admitting: Family Medicine

## 2023-09-13 ENCOUNTER — Other Ambulatory Visit: Payer: Self-pay | Admitting: Family Medicine

## 2023-09-13 ENCOUNTER — Encounter: Payer: Self-pay | Admitting: Family Medicine

## 2023-09-13 VITALS — BP 128/66 | HR 98 | Temp 97.9°F | Resp 18 | Ht 64.0 in | Wt 123.0 lb

## 2023-09-13 DIAGNOSIS — J4489 Other specified chronic obstructive pulmonary disease: Secondary | ICD-10-CM

## 2023-09-13 DIAGNOSIS — E538 Deficiency of other specified B group vitamins: Secondary | ICD-10-CM

## 2023-09-13 DIAGNOSIS — E1049 Type 1 diabetes mellitus with other diabetic neurological complication: Secondary | ICD-10-CM

## 2023-09-13 DIAGNOSIS — I152 Hypertension secondary to endocrine disorders: Secondary | ICD-10-CM | POA: Diagnosis not present

## 2023-09-13 DIAGNOSIS — F39 Unspecified mood [affective] disorder: Secondary | ICD-10-CM

## 2023-09-13 DIAGNOSIS — E108 Type 1 diabetes mellitus with unspecified complications: Secondary | ICD-10-CM | POA: Diagnosis not present

## 2023-09-13 DIAGNOSIS — E1059 Type 1 diabetes mellitus with other circulatory complications: Secondary | ICD-10-CM | POA: Diagnosis not present

## 2023-09-13 DIAGNOSIS — E559 Vitamin D deficiency, unspecified: Secondary | ICD-10-CM | POA: Diagnosis not present

## 2023-09-13 DIAGNOSIS — E1142 Type 2 diabetes mellitus with diabetic polyneuropathy: Secondary | ICD-10-CM

## 2023-09-13 MED ORDER — SPIRIVA RESPIMAT 2.5 MCG/ACT IN AERS: 2.0000 | INHALATION_SPRAY | Freq: Every day | RESPIRATORY_TRACT | 2 refills | Status: DC

## 2023-09-13 MED ORDER — CLONAZEPAM 0.5 MG PO TABS: 0.5000 mg | ORAL_TABLET | Freq: Two times a day (BID) | ORAL | 0 refills | Status: DC | PRN

## 2023-09-13 NOTE — Patient Instructions (Addendum)
 It was a pleasure meeting you today. Thank you for allowing me to take part in your health care.  Our goals for today as we discussed include:  Start Clonazepam 0.5 mg two times a day Stop Xanax.  Can take it only if having panic attack but try to avoid if possible.  Follow up in 4 days  Referral sent to Psychology for therapy  [facility] counseling and psychiatry [facility]  [address]  [place] [address] [phone]    For Mental Health Concerns  [facility] [phone] Address: [address] [place], [place] [address] Hours: Open 24/7, No appointment required.    We will get some labs today.  If they are abnormal or we need to do something about them, I will call you.  If they are normal, I will send you a message on MyChart (if it is active) or a letter in the mail.  If you don't hear from [place] in 2 weeks, please call the office at the number below.   This is a list of the screening recommended for you and due dates:  Health Maintenance  Topic Date Due   DTaP/Tdap/Td vaccine (2 - Tdap) 07/18/2009   Colon Cancer Screening  04/14/2021   Complete foot exam   01/06/2022   COVID-19 Vaccine (1 - 2024-25 season) Never done   Hemoglobin A1C  07/11/2023   Flu Shot  10/15/2023*   Pneumococcal Vaccination (1 of 2 - PCV) 08/22/2024*   Eye exam for diabetics  09/19/2023   Yearly kidney health urinalysis for diabetes  01/08/2024   Yearly kidney function blood test for diabetes  05/25/2024   Pap with HPV screening  05/29/2024   Hepatitis C Screening  Completed   HIV Screening  Completed   HPV Vaccine  Aged Out  *Topic was postponed. The date shown is not the original due date.      If you have any questions or concerns, please do not hesitate to call the office at [phone].  I look forward to our next visit and until then take care and stay safe.  Regards,   Dana Allan, MD   [facility]

## 2023-09-13 NOTE — Progress Notes (Signed)
 SUBJECTIVE:   Chief Complaint  Patient presents with   mood disorder    3 week   HPI Presents for follow up anxiety  Discussed the use of AI scribe software for clinical note transcription with the patient, who gave verbal consent to proceed.  History of Present Illness [name] is a 48 year old female who presents for medication management and follow-up for anxiety.  She is experiencing ongoing anxiety and is currently taking Xanax (alprazolam) at a dose of 1 mg per day, divided into two doses of 0.5 mg each. Previously, she was on a higher dose of 4 mg per day before her previous doctor retired, which was then reduced to 1.5 mg per day, and subsequently to 1 mg per day. She is still struggling with anxiety despite the current dosage.  She has a history of withdrawal symptoms when her medication was reduced in the past, contributing to her anxiety about medication changes. She experiences panic attacks, particularly upon waking, and notes that the current medication does not last throughout the day, requiring additional doses.  She is also taking Rexulti (brexpiprazole) at 1 mg daily and Zoloft (sertraline) at 100 mg daily. She has not increased the Zoloft dosage and has been on this dose for some time. She also takes Buspar (buspirone) and feels it may be helping her in the mornings. She reports a history of low sodium levels, which she believes may be related to her medication regimen.  She is concerned about the impact of her anxiety and medication on her ability to grieve and be present for her children following the passing of her husband a year and a half ago.  She mentions that she has not been using her inhaler for her breathing issues and requests a refill for her Spiriva (tiotropium) inhaler. She has an appointment with her pulmonologist, Dr. Rennis Harding, but has missed previous appointments.      PERTINENT PMH / PSH: As above  OBJECTIVE:  BP 128/66   Pulse 98    Temp 97.9 F (36.6 C)   Resp 18   Ht 5\' 4"  (1.626 m)   Wt 123 lb (55.8 kg)   SpO2 96%   BMI 21.11 kg/m    Physical Exam        09/20/2023   11:53 AM 09/13/2023    3:00 PM 08/23/2023   11:53 AM 08/16/2023   11:03 AM 08/02/2023   11:21 AM  Depression screen PHQ 2/9  Decreased Interest 0 2 1 2 2   Down, Depressed, Hopeless 0 1 1 3 2   PHQ - 2 Score 0 3 2 5 4   Altered sleeping 0 2 0 3 2  Tired, decreased energy 0 1 0 3 1  Change in appetite 0 1 0 3 2  Feeling bad or failure about yourself  0 1 0 3 1  Trouble concentrating 0 2 0 3 1  Moving slowly or fidgety/restless 0 2 0 2 1  Suicidal thoughts 0 0 0 0 0  PHQ-9 Score 0 12 2 22 12   Difficult doing work/chores Not difficult at all Somewhat difficult Not difficult at all Very difficult       09/20/2023   11:53 AM 09/13/2023    3:01 PM 08/23/2023   11:53 AM 08/16/2023   11:04 AM  GAD 7 : Generalized Anxiety Score  Nervous, Anxious, on Edge 0 2 1 3   Control/stop worrying 0 2 1 3   Worry too much - different things 0 2  0 3  Trouble relaxing 0 1 1 3   Restless 0 1 0 3  Easily annoyed or irritable 0 2 0 3  Afraid - awful might happen 0 2 1 3   Total GAD 7 Score 0 12 4 21   Anxiety Difficulty Not difficult at all Somewhat difficult Not difficult at all Very difficult    ASSESSMENT/PLAN:  Mood disorder [facility]) Assessment & Plan: Patient currently on Xanax 1mg  twice daily, with history of inconsistent dosing and withdrawal symptoms. Discussed transitioning to Klonopin for longer duration of action and more stable control of symptoms. Patient has a pending psychiatry referral. -Start Klonopin 0.5mg  twice daily, discontinue Xanax. -Advise patient to use remaining Xanax only in case of severe panic attack. -Follow up in one week to assess response to Klonopin, if not effective will increase to 1 mg BID -Continue Zoloft 100mg  daily, can increase at next visit -Consider genetic testing to guide medication selection. -Follow up in one week to  reassess.  Orders: -     Ambulatory referral to Psychology -     TSH  Asthma-COPD overlap syndrome (HCC) Assessment & Plan: Patient reports worsening breathing due to not using Spiriva inhaler. Patient has a pending appointment with pulmonologist. -Refill Spiriva inhaler. -Advise patient to follow up with pulmonologist.   Orders: -     Spiriva Respimat; Inhale 2 puffs into the lungs daily at 2 PM.  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  Dana Allan, MD

## 2023-09-14 LAB — CBC WITH DIFFERENTIAL/PLATELET
Basophils Absolute: 0.1 10*3/uL (ref 0.0–0.1)
Basophils Relative: 1 % (ref 0.0–3.0)
Eosinophils Absolute: 0 10*3/uL (ref 0.0–0.7)
Eosinophils Relative: 0.3 % (ref 0.0–5.0)
HCT: 41.6 % (ref 36.0–46.0)
Hemoglobin: 14.2 g/dL (ref 12.0–15.0)
Lymphocytes Relative: 23.8 % (ref 12.0–46.0)
Lymphs Abs: 1.7 10*3/uL (ref 0.7–4.0)
MCHC: 34.1 g/dL (ref 30.0–36.0)
MCV: 89.2 fL (ref 78.0–100.0)
Monocytes Absolute: 0.5 10*3/uL (ref 0.1–1.0)
Monocytes Relative: 6.5 % (ref 3.0–12.0)
Neutro Abs: 5 10*3/uL (ref 1.4–7.7)
Neutrophils Relative %: 68.4 % (ref 43.0–77.0)
Platelets: 268 10*3/uL (ref 150.0–400.0)
RBC: 4.66 Mil/uL (ref 3.87–5.11)
RDW: 13.1 % (ref 11.5–15.5)
WBC: 7.4 10*3/uL (ref 4.0–10.5)

## 2023-09-14 LAB — COMPREHENSIVE METABOLIC PANEL
ALT: 13 U/L (ref 0–35)
AST: 14 U/L (ref 0–37)
Albumin: 4.6 g/dL (ref 3.5–5.2)
Alkaline Phosphatase: 101 U/L (ref 39–117)
BUN: 9 mg/dL (ref 6–23)
CO2: 25 meq/L (ref 19–32)
Calcium: 9.6 mg/dL (ref 8.4–10.5)
Chloride: 89 meq/L — ABNORMAL LOW (ref 96–112)
Creatinine, Ser: 0.75 mg/dL (ref 0.40–1.20)
GFR: 94.81 mL/min (ref >60.00)
Glucose, Bld: 237 mg/dL — ABNORMAL HIGH (ref 70–99)
Potassium: 3.7 meq/L (ref 3.5–5.1)
Sodium: 127 meq/L — ABNORMAL LOW (ref 135–145)
Total Bilirubin: 0.4 mg/dL (ref 0.2–1.2)
Total Protein: 7.6 g/dL (ref 6.0–8.3)

## 2023-09-14 LAB — VITAMIN D 25 HYDROXY (VIT D DEFICIENCY, FRACTURES): VITD: 19.75 ng/mL — ABNORMAL LOW (ref 30.00–100.00)

## 2023-09-14 LAB — TSH: TSH: 0.72 u[IU]/mL (ref 0.35–5.50)

## 2023-09-14 LAB — HEMOGLOBIN A1C: Hgb A1c MFr Bld: 11 % — ABNORMAL HIGH (ref 4.6–6.5)

## 2023-09-14 LAB — VITAMIN B12: Vitamin B-12: 488 pg/mL (ref 211–911)

## 2023-09-20 ENCOUNTER — Ambulatory Visit: Payer: MEDICAID | Admitting: Family

## 2023-09-20 ENCOUNTER — Encounter: Payer: Self-pay | Admitting: Family

## 2023-09-20 ENCOUNTER — Telehealth: Payer: Self-pay

## 2023-09-20 ENCOUNTER — Telehealth: Payer: Self-pay | Admitting: Family

## 2023-09-20 ENCOUNTER — Other Ambulatory Visit (HOSPITAL_COMMUNITY): Payer: Self-pay

## 2023-09-20 ENCOUNTER — Telehealth: Payer: Self-pay | Admitting: Pharmacy Technician

## 2023-09-20 VITALS — BP 132/70 | HR 93 | Temp 97.6°F | Ht 64.0 in | Wt 122.2 lb

## 2023-09-20 DIAGNOSIS — F39 Unspecified mood [affective] disorder: Secondary | ICD-10-CM

## 2023-09-20 MED ORDER — CLONAZEPAM 0.5 MG PO TABS: 0.5000 mg | ORAL_TABLET | Freq: Two times a day (BID) | ORAL | 0 refills | Status: DC | PRN

## 2023-09-20 NOTE — Telephone Encounter (Signed)
 Copied from CRM [id number]. Topic: General - Running Late >> Sep 20, 2023 11:40 AM Alcus Dad wrote: Patient/patient representative is calling because they are running late for an appointment.

## 2023-09-20 NOTE — Telephone Encounter (Signed)
 Pt has been seen in office

## 2023-09-20 NOTE — Telephone Encounter (Signed)
 [name]   Referral team  What is status of psychiatry referral?  I placed a new counseling referral today as it looks like the previous counseling one was closed since she is a Medicaid patient.    Please investigate offices that will take her insurance and let me know once both of these appointments are sch

## 2023-09-20 NOTE — Telephone Encounter (Signed)
 Pharmacy Patient Advocate Encounter   Received notification from Patient Advice Request messages that prior authorization for Dexcom G7 sensor is required/requested.   Insurance verification completed.   The patient is insured through [facility] .   Per test claim: PA required; PA submitted to above mentioned insurance via CoverMyMeds Key/confirmation #/EOC [id number] Status is pending

## 2023-09-20 NOTE — Assessment & Plan Note (Addendum)
 Uncontrolled.  She has had some relief from stopping Xanax and trial of Klonopin 0.5 mg twice daily.  Prescribed BuSpar 15 mg 3 times daily however she has not increased to 3 times daily dosing.  Advised her to start BuSpar 15 mg 3 times daily, by adding midday dose, which she was agreeable to.  We discussed Atarax however in the past she has been nauseous and vomited on this medication.  Referral to counseling, psychiatry in place.  Consider increase of buspar, changing Zoloft and reviewing side effects of previous SSRI, SNRI tried, retrial of atarax.   Continue Zoloft 100 mg daily,rexulti 1mg  every day.  provided 10-day refill of Klonopin.  Counseled on risk of benzodiazepines including dependence, worsening anxiety.  Advised no alcohol with klonopin.  advised close follow-up with PCP

## 2023-09-20 NOTE — Patient Instructions (Addendum)
 Increase buspar 15mg  to three times daily, such as 9am,  1pm, 5pm as discussed today.   You may klonopin 9am and then again  later afternoon.   No alcohol with klonopin and ensure to store in a safe place and away from children.    I will look into status of counseling and psychiatry referral.  Please let me know if do not hear from our office in regards to counseling and psychiatry in the next 2 weeks.  Generalized Anxiety Disorder, Adult Generalized anxiety disorder (GAD) is a mental health condition. Unlike normal worries, anxiety related to GAD is not triggered by a specific event. These worries do not fade or get better with time. GAD interferes with relationships, work, and school. GAD symptoms can vary from mild to severe. People with severe GAD can have intense waves of anxiety with physical symptoms that are similar to panic attacks. What are the causes? The exact cause of GAD is not known, but the following are believed to have an impact: Differences in natural brain chemicals. Genes passed down from parents to children. Differences in the way threats are perceived. Development and stress during childhood. Personality. What increases the risk? The following factors may make you more likely to develop this condition: Being female. Having a family history of anxiety disorders. Being very shy. Experiencing very stressful life events, such as the death of a loved one. Having a very stressful family environment. What are the signs or symptoms? People with GAD often worry excessively about many things in their lives, such as their health and family. Symptoms may also include: Mental and emotional symptoms: Worrying excessively about natural disasters. Fear of being late. Difficulty concentrating. Fears that others are judging your performance. Physical symptoms: Fatigue. Headaches, muscle tension, muscle twitches, trembling, or feeling shaky. Feeling like your heart is pounding  or beating very fast. Feeling out of breath or like you cannot take a deep breath. Having trouble falling asleep or staying asleep, or experiencing restlessness. Sweating. Nausea, diarrhea, or irritable bowel syndrome (IBS). Behavioral symptoms: Experiencing erratic moods or irritability. Avoidance of new situations. Avoidance of people. Extreme difficulty making decisions. How is this diagnosed? This condition is diagnosed based on your symptoms and medical history. You will also have a physical exam. Your health care provider may perform tests to rule out other possible causes of your symptoms. To be diagnosed with GAD, a person must have anxiety that: Is out of his or her control. Affects several different aspects of his or her life, such as work and relationships. Causes distress that makes him or her unable to take part in normal activities. Includes at least three symptoms of GAD, such as restlessness, fatigue, trouble concentrating, irritability, muscle tension, or sleep problems. Before your health care provider can confirm a diagnosis of GAD, these symptoms must be present more days than they are not, and they must last for 6 months or longer. How is this treated? This condition may be treated with: Medicine. Antidepressant medicine is usually prescribed for long-term daily control. Anti-anxiety medicines may be added in severe cases, especially when panic attacks occur. Talk therapy (psychotherapy). Certain types of talk therapy can be helpful in treating GAD by providing support, education, and guidance. Options include: Cognitive behavioral therapy (CBT). People learn coping skills and self-calming techniques to ease their physical symptoms. They learn to identify unrealistic thoughts and behaviors and to replace them with more appropriate thoughts and behaviors. Acceptance and commitment therapy (ACT). This treatment teaches people  how to be mindful as a way to cope with unwanted  thoughts and feelings. Biofeedback. This process trains you to manage your body's response (physiological response) through breathing techniques and relaxation methods. You will work with a therapist while machines are used to monitor your physical symptoms. Stress management techniques. These include yoga, meditation, and exercise. A mental health specialist can help determine which treatment is best for you. Some people see improvement with one type of therapy. However, other people require a combination of therapies. Follow these instructions at home: Lifestyle Maintain a consistent routine and schedule. Anticipate stressful situations. Create a plan and allow extra time to work with your plan. Practice stress management or self-calming techniques that you have learned from your therapist or your health care provider. Exercise regularly and spend time outdoors. Eat a healthy diet that includes plenty of vegetables, fruits, whole grains, low-fat dairy products, and lean protein. Do not eat a lot of foods that are high in fat, added sugar, or salt (sodium). Drink plenty of water. Avoid alcohol. Alcohol can increase anxiety. Avoid caffeine and certain over-the-counter cold medicines. These may make you feel worse. Ask your pharmacist which medicines to avoid. General instructions Take over-the-counter and prescription medicines only as told by your health care provider. Understand that you are likely to have setbacks. Accept this and be kind to yourself as you persist to take better care of yourself. Anticipate stressful situations. Create a plan and allow extra time to work with your plan. Recognize and accept your accomplishments, even if you judge them as small. Spend time with people who care about you. Keep all follow-up visits. This is important. Where to find more information [employer] of Mental Health: http://www.maynard.net/ Substance Abuse and Mental Health Services:  [employer].pt Contact a health care provider if: Your symptoms do not get better. Your symptoms get worse. You have signs of depression, such as: A persistently sad or irritable mood. Loss of enjoyment in activities that used to bring you joy. Change in weight or eating. Changes in sleeping habits. Get help right away if: You have thoughts about hurting yourself or others. If you ever feel like you may hurt yourself or others, or have thoughts about taking your own life, get help right away. Go to your nearest emergency department or: Call your local emergency services (911 in the [place] Call a suicide crisis helpline, such as the National Suicide Prevention Lifeline at 7748374386 or 988 in the [place] This is open 24 hours a day in the [place] If you're a Veteran: Call 988 and press 1. This is open 24 hours a day. Text the [employer] at 516-117-0769. Summary Generalized anxiety disorder (GAD) is a mental health condition that involves worry that is not triggered by a specific event. People with GAD often worry excessively about many things in their lives, such as their health and family. GAD may cause symptoms such as restlessness, trouble concentrating, sleep problems, frequent sweating, nausea, diarrhea, headaches, and trembling or muscle twitching. A mental health specialist can help determine which treatment is best for you. Some people see improvement with one type of therapy. However, other people require a combination of therapies. This information is not intended to replace advice given to you by your health care provider. Make sure you discuss any questions you have with your health care provider. Document Revised: 02/15/2023 Document Reviewed: 10/24/2020 Elsevier Patient Education  2024 [employer].
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
## 2023-09-20 NOTE — Telephone Encounter (Signed)
 noted

## 2023-09-20 NOTE — Telephone Encounter (Signed)
 PA request has been Submitted. New Encounter has been or will be created for follow up. For additional info see Pharmacy Prior Auth telephone encounter from 09/20/23.

## 2023-09-21 ENCOUNTER — Encounter: Payer: Self-pay | Admitting: Family Medicine

## 2023-09-21 DIAGNOSIS — E559 Vitamin D deficiency, unspecified: Secondary | ICD-10-CM | POA: Insufficient documentation

## 2023-09-21 DIAGNOSIS — E538 Deficiency of other specified B group vitamins: Secondary | ICD-10-CM | POA: Insufficient documentation

## 2023-09-21 MED ORDER — VITAMIN D (ERGOCALCIFEROL) 1.25 MG (50000 UNIT) PO CAPS: 50000.0000 [IU] | ORAL_CAPSULE | ORAL | 1 refills | Status: DC

## 2023-09-21 NOTE — Assessment & Plan Note (Deleted)
 Follows with Endocrinology.

## 2023-09-21 NOTE — Assessment & Plan Note (Signed)
 Patient reports worsening breathing due to not using Spiriva inhaler. Patient has a pending appointment with pulmonologist. -Refill Spiriva inhaler. -Advise patient to follow up with pulmonologist.

## 2023-09-21 NOTE — Assessment & Plan Note (Deleted)
 Patient reports worsening breathing due to not using Spiriva inhaler. Patient has a pending appointment with pulmonologist. -Refill Spiriva inhaler. -Advise patient to follow up with pulmonologist.

## 2023-09-21 NOTE — Assessment & Plan Note (Addendum)
 Patient currently on Xanax 1mg  twice daily, with history of inconsistent dosing and withdrawal symptoms. Discussed transitioning to Klonopin for longer duration of action and more stable control of symptoms. Patient has a pending psychiatry referral. -Start Klonopin 0.5mg  twice daily, discontinue Xanax. -Advise patient to use remaining Xanax only in case of severe panic attack. -Follow up in one week to assess response to Klonopin, if not effective will increase to 1 mg BID -Continue Zoloft 100mg  daily, can increase at next visit -Consider genetic testing to guide medication selection. -Follow up in one week to reassess.

## 2023-09-21 NOTE — Telephone Encounter (Signed)
 Pharmacy Patient Advocate Encounter  Received notification from [facility] that Prior Authorization for Dexcom G7 sensor has been APPROVED from 09-20-2023 to 09-20-2024   PA #/Case ID/Reference #: [id number]

## 2023-09-21 NOTE — Assessment & Plan Note (Signed)
 Follows with Endocrinology.

## 2023-09-23 ENCOUNTER — Other Ambulatory Visit: Payer: Self-pay | Admitting: Nurse Practitioner

## 2023-09-24 ENCOUNTER — Ambulatory Visit: Payer: MEDICAID | Admitting: Family Medicine

## 2023-09-24 VITALS — BP 128/70 | HR 82 | Temp 97.6°F | Resp 20 | Ht 64.0 in | Wt 123.1 lb

## 2023-09-24 DIAGNOSIS — E559 Vitamin D deficiency, unspecified: Secondary | ICD-10-CM | POA: Diagnosis not present

## 2023-09-24 DIAGNOSIS — E108 Type 1 diabetes mellitus with unspecified complications: Secondary | ICD-10-CM

## 2023-09-24 DIAGNOSIS — F39 Unspecified mood [affective] disorder: Secondary | ICD-10-CM | POA: Diagnosis not present

## 2023-09-24 MED ORDER — CLONAZEPAM 1 MG PO TABS: 1.0000 mg | ORAL_TABLET | Freq: Two times a day (BID) | ORAL | 0 refills | Status: DC

## 2023-09-24 MED ORDER — SERTRALINE HCL 100 MG PO TABS: 150.0000 mg | ORAL_TABLET | Freq: Every day | ORAL | 0 refills | Status: DC

## 2023-09-24 NOTE — Progress Notes (Unsigned)
 SUBJECTIVE:   Chief Complaint  Patient presents with   Anxiety   HPI Presents to  clinic for follow up mood disorder  Discussed the use of AI scribe software for clinical note transcription with the patient, who gave verbal consent to proceed.  History of Present Illness The patient presents with anxiety management and medication adjustment.  She is currently taking clonazepam 0.5 mg twice a day, which was started to replace Xanax 0.5 mg twice a day with an additional half tablet as needed. Clonazepam has been more effective, and she has not experienced withdrawal symptoms. However, the current dose is insufficient, and she is considering an increase to 1 mg twice a day. No withdrawal symptoms from clonazepam.  She has a history of depression and is on Zoloft 100 mg daily. She is concerned about increasing anxiety with a higher dose but is considering an increase to 150 mg. She is also on Rexulti 1 mg, which she has been taking for about six months, and is considering an increase to 2 mg.  Her A1c has increased from 9.4 to 11, which she attributes to stress. She is under the care of an endocrinologist with an upcoming appointment in two months.  She mentions that her sodium levels have been low, particularly during a previous hospital stay, but are currently higher than before. High blood sugar levels can also contribute to pseudo-hyponatremia.  Her vitamin D levels have been low, and she is on a weekly supplement of 50,000 IU. She questions why her levels remain low despite supplementation.  She is taking magnesium over the counter due to previously low levels, which she associates with anxiety. Her potassium levels were low in the past but are currently normal.  She drinks a lot of Diet Coke and smokes, using it as a comfort. She spends time outside.     PERTINENT PMH / PSH: As above  OBJECTIVE:  BP 128/70   Pulse 82   Temp 97.6 F (36.4 C)   Resp 20   Ht 5\' 4"  (1.626 m)    Wt 123 lb 2 oz (55.8 kg)   SpO2 99%   BMI 21.13 kg/m    Physical Exam Vitals reviewed.  Constitutional:      General: She is not in acute distress.    Appearance: Normal appearance. She is normal weight. She is not ill-appearing, toxic-appearing or diaphoretic.  Eyes:     General:        Right eye: No discharge.        Left eye: No discharge.     Conjunctiva/sclera: Conjunctivae normal.  Cardiovascular:     Rate and Rhythm: Normal rate.  Pulmonary:     Effort: Pulmonary effort is normal.  Musculoskeletal:        General: Normal range of motion.  Skin:    General: Skin is warm and dry.  Neurological:     General: No focal deficit present.     Mental Status: She is alert and oriented to person, place, and time. Mental status is at baseline.  Psychiatric:        Attention and Perception: Attention normal.        Mood and Affect: Mood is anxious.        Speech: Speech normal.        Behavior: Behavior normal.        Thought Content: Thought content normal.        Judgment: Judgment normal.  09/24/2023    2:00 PM 09/20/2023   11:53 AM 09/13/2023    3:00 PM 08/23/2023   11:53 AM 08/16/2023   11:03 AM  Depression screen PHQ 2/9  Decreased Interest 1 0 2 1 2   Down, Depressed, Hopeless 1 0 1 1 3   PHQ - 2 Score 2 0 3 2 5   Altered sleeping 2 0 2 0 3  Tired, decreased energy 1 0 1 0 3  Change in appetite 2 0 1 0 3  Feeling bad or failure about yourself  0 0 1 0 3  Trouble concentrating 0 0 2 0 3  Moving slowly or fidgety/restless 0 0 2 0 2  Suicidal thoughts 0 0 0 0 0  PHQ-9 Score 7 0 12 2 22   Difficult doing work/chores Somewhat difficult Not difficult at all Somewhat difficult Not difficult at all Very difficult      09/24/2023    2:01 PM 09/20/2023   11:53 AM 09/13/2023    3:01 PM 08/23/2023   11:53 AM  GAD 7 : Generalized Anxiety Score  Nervous, Anxious, on Edge 2 0 2 1  Control/stop worrying 3 0 2 1  Worry too much - different things 3 0 2 0  Trouble  relaxing 2 0 1 1  Restless 0 0 1 0  Easily annoyed or irritable 1 0 2 0  Afraid - awful might happen 1 0 2 1  Total GAD 7 Score 12 0 12 4  Anxiety Difficulty Somewhat difficult Not difficult at all Somewhat difficult Not difficult at all    ASSESSMENT/PLAN:  Mood disorder (HCC) Assessment & Plan: Currently on 100 mg Zoloft. Consider increasing to 150 mg to manage symptoms. Discussed increasing Rexulti as alternative. Transitioned from Xanax to clonazepam. Clonazepam dose insufficient, plan to increase to prevent anxiety episodes. - Increase Zoloft to 150 mg daily. - Continue Rexulti 1 mg daily - Increase Clonazepam to 1 mg twice daily - Continue Buspar 15 mg TID - Monitor for changes in depressive and anxiety symptoms.    Orders: -     clonazePAM; Take 1 tablet (1 mg total) by mouth 2 (two) times daily. Further refill by PCP  Dispense: 60 tablet; Refill: 0 -     Sertraline HCl; Take 1.5 tablets (150 mg total) by mouth daily.  Dispense: 45 tablet; Refill: 0  Type 1 diabetes mellitus with complications (HCC) Assessment & Plan: A1c increased from 9.4 to 11, indicating poor control. Under endocrinologist care. - Continue follow-up with endocrinologist.    Vitamin D deficiency Assessment & Plan: On weekly 50,000 IU vitamin D. Levels remain low despite supplementation. Emphasized need for continued supplementation due to limited sun exposure. - Continue vitamin D 50,000 IU once weekly for six months. - Reassess vitamin D levels after six months.      PDMP reviewed  Return in about 2 weeks (around 10/08/2023) for PCP.  Dana Allan, MD

## 2023-09-24 NOTE — Patient Instructions (Addendum)
 It was a pleasure meeting you today. Thank you for allowing me to take part in your health care.  Our goals for today as we discussed include:  Increase Zoloft to 150 mg daily  Increase Clonazepam to 1 mg two times a day Can take 2 of the 0.5 mg two times a day.    Continue Rexulti 1 mg daily\ Continue Buspar 15 mg three times a day  Schedule appointment with Endocrinology for follow up A1c 11.0   This is a list of the screening recommended for you and due dates:  Health Maintenance  Topic Date Due   DTaP/Tdap/Td vaccine (2 - Tdap) 07/18/2009   Colon Cancer Screening  04/14/2021   Complete foot exam   01/06/2022   COVID-19 Vaccine (1 - 2024-25 season) Never done   Eye exam for diabetics  09/19/2023   Flu Shot  10/15/2023*   Pneumococcal Vaccination (1 of 2 - PCV) 08/22/2024*   Yearly kidney health urinalysis for diabetes  01/08/2024   Hemoglobin A1C  03/12/2024   Pap with HPV screening  05/29/2024   Yearly kidney function blood test for diabetes  09/12/2024   Hepatitis C Screening  Completed   HIV Screening  Completed   HPV Vaccine  Aged Out  *Topic was postponed. The date shown is not the original due date.      If you have any questions or concerns, please do not hesitate to call the office at [phone].  I look forward to our next visit and until then take care and stay safe.  Regards,   Dana Allan, MD   [facility]

## 2023-09-28 ENCOUNTER — Encounter: Payer: Self-pay | Admitting: Family Medicine

## 2023-09-28 NOTE — Assessment & Plan Note (Signed)
 On weekly 50,000 IU vitamin D. Levels remain low despite supplementation. Emphasized need for continued supplementation due to limited sun exposure. - Continue vitamin D 50,000 IU once weekly for six months. - Reassess vitamin D levels after six months.

## 2023-09-28 NOTE — Assessment & Plan Note (Addendum)
 Currently on 100 mg Zoloft. Consider increasing to 150 mg to manage symptoms. Discussed increasing Rexulti as alternative. Transitioned from Xanax to clonazepam. Clonazepam dose insufficient, plan to increase to prevent anxiety episodes. - Increase Zoloft to 150 mg daily. - Continue Rexulti 1 mg daily - Increase Clonazepam to 1 mg twice daily - Continue Buspar 15 mg TID - Monitor for changes in depressive and anxiety symptoms.

## 2023-09-28 NOTE — Assessment & Plan Note (Signed)
 A1c increased from 9.4 to 11, indicating poor control. Under endocrinologist care. - Continue follow-up with endocrinologist.

## 2023-09-30 ENCOUNTER — Other Ambulatory Visit: Payer: Self-pay

## 2023-09-30 ENCOUNTER — Emergency Department: Payer: MEDICAID

## 2023-09-30 ENCOUNTER — Emergency Department
Admission: EM | Admit: 2023-09-30 | Discharge: 2023-09-30 | Disposition: A | Discharge status: Home / Self Care | Payer: MEDICAID | Attending: Emergency Medicine | Admitting: Emergency Medicine

## 2023-09-30 DIAGNOSIS — U071 COVID-19: Secondary | ICD-10-CM | POA: Diagnosis not present

## 2023-09-30 DIAGNOSIS — J069 Acute upper respiratory infection, unspecified: Secondary | ICD-10-CM | POA: Diagnosis present

## 2023-09-30 DIAGNOSIS — E871 Hypo-osmolality and hyponatremia: Secondary | ICD-10-CM | POA: Diagnosis not present

## 2023-09-30 LAB — CBC WITH DIFFERENTIAL/PLATELET
Abs Immature Granulocytes: 0.02 10*3/uL (ref 0.00–0.07)
Basophils Absolute: 0 10*3/uL (ref 0.0–0.1)
Basophils Relative: 1 %
Eosinophils Absolute: 0.1 10*3/uL (ref 0.0–0.5)
Eosinophils Relative: 3 %
HCT: 45.5 % (ref 36.0–46.0)
Hemoglobin: 16.2 g/dL — ABNORMAL HIGH (ref 12.0–15.0)
Immature Granulocytes: 1 %
Lymphocytes Relative: 21 %
Lymphs Abs: 0.9 10*3/uL (ref 0.7–4.0)
MCH: 30.5 pg (ref 26.0–34.0)
MCHC: 35.6 g/dL (ref 30.0–36.0)
MCV: 85.7 fL (ref 80.0–100.0)
Monocytes Absolute: 0.6 10*3/uL (ref 0.1–1.0)
Monocytes Relative: 14 %
Neutro Abs: 2.6 10*3/uL (ref 1.7–7.7)
Neutrophils Relative %: 60 %
Platelets: 170 10*3/uL (ref 150–400)
RBC: 5.31 MIL/uL — ABNORMAL HIGH (ref 3.87–5.11)
RDW: 12.7 % (ref 11.5–15.5)
WBC: 4.2 10*3/uL (ref 4.0–10.5)
nRBC: 0 % (ref 0.0–0.2)

## 2023-09-30 LAB — COMPREHENSIVE METABOLIC PANEL
ALT: 16 U/L (ref 0–44)
AST: 27 U/L (ref 15–41)
Albumin: 4.3 g/dL (ref 3.5–5.0)
Alkaline Phosphatase: 87 U/L (ref 38–126)
Anion gap: 12 (ref 5–15)
BUN: 11 mg/dL (ref 6–20)
CO2: 23 mmol/L (ref 22–32)
Calcium: 9.2 mg/dL (ref 8.9–10.3)
Chloride: 85 mmol/L — ABNORMAL LOW (ref 98–111)
Creatinine, Ser: 0.79 mg/dL (ref 0.44–1.00)
GFR, Estimated: >60 mL/min (ref >60)
Glucose, Bld: 185 mg/dL — ABNORMAL HIGH (ref 70–99)
Potassium: 3.5 mmol/L (ref 3.5–5.1)
Sodium: 120 mmol/L — ABNORMAL LOW (ref 135–145)
Total Bilirubin: 0.6 mg/dL (ref 0.0–1.2)
Total Protein: 8.1 g/dL (ref 6.5–8.1)

## 2023-09-30 LAB — GROUP A STREP BY PCR: Group A Strep by PCR: NOT DETECTED

## 2023-09-30 LAB — CBG MONITORING, ED: Glucose-Capillary: 173 mg/dL — ABNORMAL HIGH (ref 70–99)

## 2023-09-30 LAB — RESP PANEL BY RT-PCR (RSV, FLU A&B, COVID)  RVPGX2
Influenza A by PCR: NEGATIVE
Influenza B by PCR: NEGATIVE
Resp Syncytial Virus by PCR: NEGATIVE
SARS Coronavirus 2 by RT PCR: POSITIVE — AB

## 2023-09-30 NOTE — ED Notes (Signed)
 See triage notes. Patient c/o body aches, fever, chills times two days.

## 2023-09-30 NOTE — ED Triage Notes (Signed)
 Pt c/o sore throat, cough, headache, swollen glands in her neck, and fever 101. Pt reports others in the house have been sick. She said she started with diarrhea today.

## 2023-09-30 NOTE — ED Provider Notes (Signed)
 [facility] Provider Note    Event Date/Time   First MD Initiated Contact with Patient 09/30/23 1736     (approximate)   History   URI   HPI  [name] is a 48 y.o. female who presents with complaints of fatigue weakness, headaches, body aches x 3 days.  No nausea or vomiting, no shortness of breath, occasional cough.  Her mother had COVID recently     Physical Exam   Triage Vital Signs: ED Triage Vitals [09/30/23 1659]  Encounter Vitals Group     BP 133/82     Systolic BP Percentile      Diastolic BP Percentile      Pulse Rate (!) 123     Resp 19     Temp 98.7 F (37.1 C)     Temp Source Oral     SpO2 98 %     Weight      Height      Head Circumference      Peak Flow      Pain Score 8     Pain Loc      Pain Education      Exclude from Growth Chart     Most recent vital signs: Vitals:   09/30/23 1659  BP: 133/82  Pulse: (!) 123  Resp: 19  Temp: 98.7 F (37.1 C)  SpO2: 98%     General: Awake, no distress.  CV:  Good peripheral perfusion.  Resp:  Normal effort.  Abd:  No distention.  Other:     ED Results / Procedures / Treatments   Labs (all labs ordered are listed, but only abnormal results are displayed) Labs Reviewed  RESP PANEL BY RT-PCR (RSV, FLU A&B, COVID)  [id number] - Abnormal; Notable for the following components:      Result Value   SARS Coronavirus 2 by RT PCR POSITIVE (*)    All other components within normal limits  CBC WITH DIFFERENTIAL/PLATELET - Abnormal; Notable for the following components:   RBC 5.31 (*)    Hemoglobin 16.2 (*)    All other components within normal limits  COMPREHENSIVE METABOLIC PANEL - Abnormal; Notable for the following components:   Sodium 120 (*)    Chloride 85 (*)    Glucose, Bld 185 (*)    All other components within normal limits  CBG MONITORING, ED - Abnormal; Notable for the following components:   Glucose-Capillary 173 (*)    All other components within normal  limits  GROUP A STREP BY PCR     EKG     RADIOLOGY Chest x-ray viewed interpret by me, no acute abnormality    PROCEDURES:  Critical Care performed:   Procedures   MEDICATIONS ORDERED IN ED: Medications - No data to display   IMPRESSION / MDM / ASSESSMENT AND PLAN / ED COURSE  I reviewed the triage vital signs and the nursing notes. Patient's presentation is most consistent with acute illness / injury with system symptoms. Patient presents with symptoms suspicious for viral illness, possibly influenza versus COVID, bacterial pneumonia is also a possibility   Lab work reviewed and is overall reassuring, she does have hyponatremia but in reviewing her records her sodium has never been higher than 127 in the last 4 to 5 months  Discussed hyponatremia and the need for outpatient follow-up, do not think this necessitates admission at this time given the chronicity, recommend supportive care for COVID-19, recheck BMP after she is feeling better  FINAL CLINICAL IMPRESSION(S) / ED DIAGNOSES   Final diagnoses:  COVID  Chronic hyponatremia     Rx / DC Orders   ED Discharge Orders     None        Note:  This document was prepared using Dragon voice recognition software and may include unintentional dictation errors.   Jene Every, MD 09/30/23 [phone]

## 2023-10-02 ENCOUNTER — Other Ambulatory Visit: Payer: Self-pay | Admitting: Family Medicine

## 2023-10-03 ENCOUNTER — Ambulatory Visit: Payer: MEDICAID | Admitting: Family Medicine

## 2023-10-06 ENCOUNTER — Telehealth: Payer: MEDICAID | Admitting: Family Medicine

## 2023-10-06 DIAGNOSIS — R059 Cough, unspecified: Secondary | ICD-10-CM

## 2023-10-06 DIAGNOSIS — J019 Acute sinusitis, unspecified: Secondary | ICD-10-CM | POA: Diagnosis not present

## 2023-10-06 MED ORDER — DOXYCYCLINE HYCLATE 100 MG PO TABS: 100.0000 mg | ORAL_TABLET | Freq: Two times a day (BID) | ORAL | 0 refills | Status: AC

## 2023-10-06 MED ORDER — BENZONATATE 100 MG PO CAPS: 100.0000 mg | ORAL_CAPSULE | Freq: Three times a day (TID) | ORAL | 0 refills | Status: DC | PRN

## 2023-10-06 NOTE — Patient Instructions (Signed)
 [name], thank you for joining Reed Pandy, PA-C for today's virtual visit.  While this provider is not your primary care provider (PCP), if your PCP is located in our provider database this encounter information will be shared with them immediately following your visit.   A [facility] MyChart account gives you access to today's visit and all your visits, tests, and labs performed at [facility] " click here if you don't have a  MyChart account or go to [email or web address]  Consent: (Patient) [name] provided verbal consent for this virtual visit at the beginning of the encounter.  Current Medications:  Current Outpatient Medications:    amLODipine (NORVASC) 10 MG tablet, Take 1 tablet (10 mg total) by mouth daily., Disp: 90 tablet, Rfl: 0   atorvastatin (LIPITOR) 10 MG tablet, Take 1 tablet (10 mg total) by mouth at bedtime., Disp: 90 tablet, Rfl: 1   Blood Glucose Monitoring Suppl (ACCU-CHEK GUIDE ME) w/Device KIT, Use to check blood sugar daily, Disp: 1 kit, Rfl: 0   busPIRone (BUSPAR) 15 MG tablet, Take 15 mg by mouth 3 (three) times daily., Disp: , Rfl:    Cholecalciferol (VITAMIN D3) 50 MCG (2000 UT) capsule, Take 2,000 Units by mouth daily., Disp: , Rfl:    clonazePAM (KLONOPIN) 1 MG tablet, Take 1 tablet (1 mg total) by mouth 2 (two) times daily. Further refill by PCP, Disp: 60 tablet, Rfl: 0   Continuous Glucose Sensor (DEXCOM G6 SENSOR) MISC, 1 each by Does not apply route See admin instructions. Change sensor every 10 days; E10.9, Disp: 6 each, Rfl: 1   Continuous Glucose Sensor (DEXCOM G7 SENSOR) MISC, Change every 10 days, Disp: 3 each, Rfl: 3   Continuous Glucose Transmitter (DEXCOM G6 TRANSMITTER) MISC, Use as instructed to check blood sugars, change every 3 months., Disp: 1 each, Rfl: 3   gabapentin (NEURONTIN) 300 MG capsule, TAKE 1 CAPSULE BY MOUTH THREE TIMES A DAY AS NEEDED, Disp: 270 capsule, Rfl: 1   glucagon 1 MG  injection, Inject 1 mg into the muscle once as needed., Disp: 1 each, Rfl: 12   insulin aspart (NOVOLOG) 100 UNIT/ML injection, INJECT 40 UNITS DAILY PER INSULIN PUMP (MAX DOSAGE), Disp: 30 mL, Rfl: 1   Insulin Pen Needle (PEN NEEDLES) 32G X 5 MM MISC, Use as instructed, Disp: 100 each, Rfl: 0   Multiple Vitamin (MULTIVITAMIN WITH MINERALS) TABS tablet, Take 1 tablet by mouth daily., Disp: , Rfl:    omeprazole (PRILOSEC) 40 MG capsule, Take 1 capsule (40 mg total) by mouth 2 (two) times daily. TAKE 1 CAPSULE TWICE A DAY. CALL [phone] TO SCHEDULE AN APPOINTMENT (NEEDS FOR FUTURE REFILLS), Disp: 60 capsule, Rfl: 0   ondansetron (ZOFRAN-ODT) 4 MG disintegrating tablet, Take 1 tablet (4 mg total) by mouth every 8 (eight) hours as needed for nausea or vomiting., Disp: 20 tablet, Rfl: 0   oxyCODONE-acetaminophen (PERCOCET) 5-325 MG tablet, Take 1 tablet by mouth every 4 (four) hours as needed for severe pain., Disp: 15 tablet, Rfl: 0   REXULTI 1 MG TABS tablet, Take 1 mg by mouth daily., Disp: , Rfl:    rOPINIRole (REQUIP) 1 MG tablet, TAKE 1 TABLET (1 MG TOTAL) BY MOUTH AT BEDTIME. TAKE 1 TABLET BY MOUTH AT BEDTIME, Disp: 90 tablet, Rfl: 3   sertraline (ZOLOFT) 100 MG tablet, Take 1.5 tablets (150 mg total) by mouth daily., Disp: 45 tablet, Rfl: 0   Tiotropium Bromide Monohydrate (SPIRIVA RESPIMAT) 2.5 MCG/ACT AERS, Inhale 2  puffs into the lungs daily at 2 PM., Disp: 4 g, Rfl: 2   VENTOLIN HFA 108 (90 Base) MCG/ACT inhaler, , Disp: , Rfl:    Vitamin D, Ergocalciferol, (DRISDOL) 1.25 MG (50000 UNIT) CAPS capsule, Take 1 capsule (50,000 Units total) by mouth every 7 (seven) days., Disp: 12 capsule, Rfl: 1   Medications ordered in this encounter:  No orders of the defined types were placed in this encounter.    *If you need refills on other medications prior to your next appointment, please contact your pharmacy*  Follow-Up: Call back or seek an in-person evaluation if the symptoms worsen or if the  condition fails to improve as anticipated.  [facility] [phone]  Other Instructions Sinus Infection, Adult A sinus infection, also called sinusitis, is inflammation of your sinuses. Sinuses are hollow spaces in the bones around your face. Your sinuses are located: Around your eyes. In the middle of your forehead. Behind your nose. In your cheekbones. Mucus normally drains out of your sinuses. When your nasal tissues become inflamed or swollen, mucus can become trapped or blocked. This allows bacteria, viruses, and fungi to grow, which leads to infection. Most infections of the sinuses are caused by a virus. A sinus infection can develop quickly. It can last for up to 4 weeks (acute) or for more than 12 weeks (chronic). A sinus infection often develops after a cold. What are the causes? This condition is caused by anything that creates swelling in the sinuses or stops mucus from draining. This includes: Allergies. Asthma. Infection from bacteria or viruses. Deformities or blockages in your nose or sinuses. Abnormal growths in the nose (nasal polyps). Pollutants, such as chemicals or irritants in the air. Infection from fungi. This is rare. What increases the risk? You are more likely to develop this condition if you: Have a weak body defense system (immune system). Do a lot of swimming or diving. Overuse nasal sprays. Smoke. What are the signs or symptoms? The main symptoms of this condition are pain and a feeling of pressure around the affected sinuses. Other symptoms include: Stuffy nose or congestion that makes it difficult to breathe through your nose. Thick yellow or greenish drainage from your nose. Tenderness, swelling, and warmth over the affected sinuses. A cough that may get worse at night. Decreased sense of smell and taste. Extra mucus that collects in the throat or the back of the nose (postnasal drip) causing a sore throat or bad breath. Tiredness  (fatigue). Fever. How is this diagnosed? This condition is diagnosed based on: Your symptoms. Your medical history. A physical exam. Tests to find out if your condition is acute or chronic. This may include: Checking your nose for nasal polyps. Viewing your sinuses using a device that has a light (endoscope). Testing for allergies or bacteria. Imaging tests, such as an MRI or CT scan. In rare cases, a bone biopsy may be done to rule out more serious types of fungal sinus disease. How is this treated? Treatment for a sinus infection depends on the cause and whether your condition is chronic or acute. If caused by a virus, your symptoms should go away on their own within 10 days. You may be given medicines to relieve symptoms. They include: Medicines that shrink swollen nasal passages (decongestants). A spray that eases inflammation of the nostrils (topical intranasal corticosteroids). Rinses that help get rid of thick mucus in your nose (nasal saline washes). Medicines that treat allergies (antihistamines). Over-the-counter pain relievers.  If caused by bacteria, your health care provider may recommend waiting to see if your symptoms improve. Most bacterial infections will get better without antibiotic medicine. You may be given antibiotics if you have: A severe infection. A weak immune system. If caused by narrow nasal passages or nasal polyps, surgery may be needed. Follow these instructions at home: Medicines Take, use, or apply over-the-counter and prescription medicines only as told by your health care provider. These may include nasal sprays. If you were prescribed an antibiotic medicine, take it as told by your health care provider. Do not stop taking the antibiotic even if you start to feel better. Hydrate and humidify  Drink enough fluid to keep your urine pale yellow. Staying hydrated will help to thin your mucus. Use a cool mist humidifier to keep the humidity level in your  home above 50%. Inhale steam for 10-15 minutes, 3-4 times a day, or as told by your health care provider. You can do this in the bathroom while a hot shower is running. Limit your exposure to cool or dry air. Rest Rest as much as possible. Sleep with your head raised (elevated). Make sure you get enough sleep each night. General instructions  Apply a warm, moist washcloth to your face 3-4 times a day or as told by your health care provider. This will help with discomfort. Use nasal saline washes as often as told by your health care provider. Wash your hands often with soap and water to reduce your exposure to germs. If soap and water are not available, use hand sanitizer. Do not smoke. Avoid being around people who are smoking (secondhand smoke). Keep all follow-up visits. This is important. Contact a health care provider if: You have a fever. Your symptoms get worse. Your symptoms do not improve within 10 days. Get help right away if: You have a severe headache. You have persistent vomiting. You have severe pain or swelling around your face or eyes. You have vision problems. You develop confusion. Your neck is stiff. You have trouble breathing. These symptoms may be an emergency. Get help right away. Call 911. Do not wait to see if the symptoms will go away. Do not drive yourself to the hospital. Summary A sinus infection is soreness and inflammation of your sinuses. Sinuses are hollow spaces in the bones around your face. This condition is caused by nasal tissues that become inflamed or swollen. The swelling traps or blocks the flow of mucus. This allows bacteria, viruses, and fungi to grow, which leads to infection. If you were prescribed an antibiotic medicine, take it as told by your health care provider. Do not stop taking the antibiotic even if you start to feel better. Keep all follow-up visits. This is important. This information is not intended to replace advice given to  you by your health care provider. Make sure you discuss any questions you have with your health care provider. Document Revised: 06/07/2021 Document Reviewed: 06/07/2021 Elsevier Patient Education  2024 Elsevier Inc.   If you have been instructed to have an in-person evaluation today at a local Urgent Care facility, please use the link below. It will take you to a list of all of our available [facility], including address, phone number and hours of operation. Please do not delay care.   Urgent [facility]  If you or a family member do not have a primary care provider, use the link below to schedule a visit and establish care. When you choose  a [facility] primary care physician or advanced practice provider, you gain a long-term partner in health. Find a Primary Care Provider  Learn more about [facility] in-office and virtual care options: [place] - Get Care Now

## 2023-10-06 NOTE — Progress Notes (Signed)
 Virtual Visit Consent   [name], you are scheduled for a virtual visit with a [facility] provider today. Just as with appointments in the office, your consent must be obtained to participate. Your consent will be active for this visit and any virtual visit you may have with one of our providers in the next 365 days. If you have a MyChart account, a copy of this consent can be sent to you electronically.  As this is a virtual visit, video technology does not allow for your provider to perform a traditional examination. This may limit your provider's ability to fully assess your condition. If your provider identifies any concerns that need to be evaluated in person or the need to arrange testing (such as labs, EKG, etc.), we will make arrangements to do so. Although advances in technology are sophisticated, we cannot ensure that it will always work on either your end or our end. If the connection with a video visit is poor, the visit may have to be switched to a telephone visit. With either a video or telephone visit, we are not always able to ensure that we have a secure connection.  By engaging in this virtual visit, you consent to the provision of healthcare and authorize for your insurance to be billed (if applicable) for the services provided during this visit. Depending on your insurance coverage, you may receive a charge related to this service.  I need to obtain your verbal consent now. Are you willing to proceed with your visit today? [name] has provided verbal consent on 10/06/2023 for a virtual visit (video or telephone). Reed Pandy, [place]  Date: 10/06/2023 3:19 PM   Virtual Visit via Video Note   I, Reed Pandy, connected with  [name]  ([id number], 1976-02-07) on 10/06/23 at  3:15 PM EDT by a video-enabled telemedicine application and verified that I am speaking with the correct person using two identifiers.  Location: Patient: irtual Visit Location  Patient: Parked car Provider: Virtual Visit Location Provider: Home Office   I discussed the limitations of evaluation and management by telemedicine and the availability of in person appointments. The patient expressed understanding and agreed to proceed.    History of Present Illness: [name] is a 48 y.o. who identifies as a female who was assigned female at birth, and is being seen today for c/o having COVID and in the hospital March 16th and had covid three days before going to the hospital.  Pt states she is now dealing with a bad cough and has mucus stuck in her nose.  Pt states she has facial pain, headaches but no fever and chills.  Pt states she feels like she is getting sick all over again. Pt states her cough is pretty bad.  Pt states she is coughing up green mucus.   HPI: HPI  Problems:  Patient Active Problem List   Diagnosis Date Noted   Vitamin B 12 deficiency 09/21/2023   Vitamin D deficiency 09/21/2023   Mood disorder (HCC) 08/15/2023   Pars defect with spondylolisthesis 03/13/2023   Closed fracture of lateral malleolus 03/07/2023   Closed fracture of fifth metatarsal bone 02/21/2023   Fracture of base of fifth metatarsal bone 02/21/2023   Lumbar spondylosis 10/02/2022   Neck pain 08/31/2022   Lumbar radiculopathy 08/31/2022   Degeneration of lumbar intervertebral disc 08/31/2022   Lumbar adjacent segment disease with spondylolisthesis 08/31/2022   Emphysema lung (HCC) 08/19/2022   Gastroparesis due to DM (  HCC) 08/19/2022   Benzodiazepine dependence (HCC) 08/19/2022   Hypertension associated with diabetes (HCC) 08/19/2022   Cough 08/19/2022   Aortic atherosclerosis (HCC) 08/19/2022   Claustrophobia 08/15/2022   Knee pain, bilateral 07/31/2022   Diabetes mellitus type I (HCC) 06/02/2022   Hyponatremia 03/14/2022   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 associated with type 1 diabetes mellitus (HCC) 08/16/2021   Primary  osteoarthritis of both knees 11/30/2019   Severe nonproliferative diabetic retinopathy of right eye, without macular edema, associated with type 1 diabetes mellitus (HCC) 11/04/2019   Severe nonproliferative diabetic retinopathy of left eye, without macular edema, associated with type 1 diabetes mellitus (HCC) 11/04/2019   Chronic pain syndrome 11/13/2018   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   Snoring 08/12/2018   Breast lump 08/12/2018   Lung nodule 08/12/2018   Pain in joint of left shoulder 05/03/2018   Osteoarthritis of knee 04/18/2018   Pain in left knee 03/11/2018   Pain in right knee 03/11/2018   Diabetic peripheral neuropathy (HCC) 01/11/2018   Low back pain with left-sided sciatica 11/29/2017   Neuropathic pain 09/14/2017   Isthmic spondylolisthesis 09/14/2017   Lesion of nasal septum 07/30/2017   Multinodular goiter 07/29/2015   Type 1 diabetes mellitus with complications (HCC) 01/27/2014   Restless leg 11/07/2013   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   Cholesteatoma 09/11/2011   Allergy to environmental factors 09/11/2011   Anxiety 05/01/2011   Depressive disorder 04/01/2010   INSOMNIA 02/18/2010   GERD 12/21/2008   SMOKER 09/25/2007    Allergies: No Known Allergies Medications:  Current Outpatient Medications:    benzonatate (TESSALON) 100 MG capsule, Take 1 capsule (100 mg total) by mouth 3 (three) times daily as needed for up to 7 days., Disp: 21 capsule, Rfl: 0   doxycycline (VIBRA-TABS) 100 MG tablet, Take 1 tablet (100 mg total) by mouth 2 (two) times daily for 7 days., Disp: 14 tablet, Rfl: 0   amLODipine (NORVASC) 10 MG tablet, Take 1 tablet (10 mg total) by mouth daily., Disp: 90 tablet, Rfl: 0   atorvastatin (LIPITOR) 10 MG tablet, Take 1 tablet (10 mg total) by mouth at bedtime., Disp: 90 tablet, Rfl: 1   Blood Glucose Monitoring Suppl (ACCU-CHEK GUIDE ME) w/Device KIT, Use to check blood sugar daily, Disp: 1 kit, Rfl: 0   busPIRone  (BUSPAR) 15 MG tablet, Take 15 mg by mouth 3 (three) times daily., Disp: , Rfl:    Cholecalciferol (VITAMIN D3) 50 MCG (2000 UT) capsule, Take 2,000 Units by mouth daily., Disp: , Rfl:    clonazePAM (KLONOPIN) 1 MG tablet, Take 1 tablet (1 mg total) by mouth 2 (two) times daily. Further refill by PCP, Disp: 60 tablet, Rfl: 0   Continuous Glucose Sensor (DEXCOM G6 SENSOR) MISC, 1 each by Does not apply route See admin instructions. Change sensor every 10 days; E10.9, Disp: 6 each, Rfl: 1   Continuous Glucose Sensor (DEXCOM G7 SENSOR) MISC, Change every 10 days, Disp: 3 each, Rfl: 3   Continuous Glucose Transmitter (DEXCOM G6 TRANSMITTER) MISC, Use as instructed to check blood sugars, change every 3 months., Disp: 1 each, Rfl: 3   gabapentin (NEURONTIN) 300 MG capsule, TAKE 1 CAPSULE BY MOUTH THREE TIMES A DAY AS NEEDED, Disp: 270 capsule, Rfl: 1   glucagon 1 MG injection, Inject 1 mg into the muscle once as needed., Disp: 1 each, Rfl: 12   insulin aspart (NOVOLOG) 100 UNIT/ML injection, INJECT 40 UNITS DAILY PER INSULIN PUMP (MAX  DOSAGE), Disp: 30 mL, Rfl: 1   Insulin Pen Needle (PEN NEEDLES) 32G X 5 MM MISC, Use as instructed, Disp: 100 each, Rfl: 0   Multiple Vitamin (MULTIVITAMIN WITH MINERALS) TABS tablet, Take 1 tablet by mouth daily., Disp: , Rfl:    omeprazole (PRILOSEC) 40 MG capsule, Take 1 capsule (40 mg total) by mouth 2 (two) times daily. TAKE 1 CAPSULE TWICE A DAY. CALL [phone] TO SCHEDULE AN APPOINTMENT (NEEDS FOR FUTURE REFILLS), Disp: 60 capsule, Rfl: 0   ondansetron (ZOFRAN-ODT) 4 MG disintegrating tablet, Take 1 tablet (4 mg total) by mouth every 8 (eight) hours as needed for nausea or vomiting., Disp: 20 tablet, Rfl: 0   oxyCODONE-acetaminophen (PERCOCET) 5-325 MG tablet, Take 1 tablet by mouth every 4 (four) hours as needed for severe pain., Disp: 15 tablet, Rfl: 0   REXULTI 1 MG TABS tablet, Take 1 mg by mouth daily., Disp: , Rfl:    rOPINIRole (REQUIP) 1 MG tablet, TAKE 1  TABLET (1 MG TOTAL) BY MOUTH AT BEDTIME. TAKE 1 TABLET BY MOUTH AT BEDTIME, Disp: 90 tablet, Rfl: 3   sertraline (ZOLOFT) 100 MG tablet, Take 1.5 tablets (150 mg total) by mouth daily., Disp: 45 tablet, Rfl: 0   Tiotropium Bromide Monohydrate (SPIRIVA RESPIMAT) 2.5 MCG/ACT AERS, Inhale 2 puffs into the lungs daily at 2 PM., Disp: 4 g, Rfl: 2   VENTOLIN HFA 108 (90 Base) MCG/ACT inhaler, , Disp: , Rfl:    Vitamin D, Ergocalciferol, (DRISDOL) 1.25 MG (50000 UNIT) CAPS capsule, Take 1 capsule (50,000 Units total) by mouth every 7 (seven) days., Disp: 12 capsule, Rfl: 1  Observations/Objective: Patient is well-developed, well-nourished in no acute distress.  Resting comfortably Head is normocephalic, atraumatic.  No labored breathing. Speech is clear and coherent with logical content.  Patient is alert and oriented at baseline.    Assessment and Plan: 1. Acute sinusitis, recurrence not specified, unspecified location (Primary) - doxycycline (VIBRA-TABS) 100 MG tablet; Take 1 tablet (100 mg total) by mouth 2 (two) times daily for 7 days.  Dispense: 14 tablet; Refill: 0  2. Cough, unspecified type - benzonatate (TESSALON) 100 MG capsule; Take 1 capsule (100 mg total) by mouth 3 (three) times daily as needed for up to 7 days.  Dispense: 21 capsule; Refill: 0  -Will treat for sinus infection with differential diagnosis to include bronchitis  -Advised Pt to follow up in person for continued symptoms  -Pt stated she has flonase at home, I advised Pt to begin using the flonase as directed.   Follow Up Instructions: I discussed the assessment and treatment plan with the patient. The patient was provided an opportunity to ask questions and all were answered. The patient agreed with the plan and demonstrated an understanding of the instructions.  A copy of instructions were sent to the patient via MyChart unless otherwise noted below.    The patient was advised to call back or seek an in-person  evaluation if the symptoms worsen or if the condition fails to improve as anticipated.    Reed Pandy, PA-C

## 2023-10-09 ENCOUNTER — Telehealth (INDEPENDENT_AMBULATORY_CARE_PROVIDER_SITE_OTHER): Payer: MEDICAID | Admitting: Family Medicine

## 2023-10-09 ENCOUNTER — Other Ambulatory Visit: Payer: Self-pay | Admitting: Family Medicine

## 2023-10-09 ENCOUNTER — Encounter: Payer: Self-pay | Admitting: Family Medicine

## 2023-10-09 VITALS — Ht 64.0 in | Wt 123.1 lb

## 2023-10-09 DIAGNOSIS — E871 Hypo-osmolality and hyponatremia: Secondary | ICD-10-CM

## 2023-10-09 DIAGNOSIS — J3489 Other specified disorders of nose and nasal sinuses: Secondary | ICD-10-CM

## 2023-10-09 DIAGNOSIS — E108 Type 1 diabetes mellitus with unspecified complications: Secondary | ICD-10-CM | POA: Diagnosis not present

## 2023-10-09 DIAGNOSIS — J0100 Acute maxillary sinusitis, unspecified: Secondary | ICD-10-CM

## 2023-10-09 DIAGNOSIS — U071 COVID-19: Secondary | ICD-10-CM

## 2023-10-09 LAB — POC INFLUENZA A&B (BINAX/QUICKVUE)
Influenza A, POC: NEGATIVE
Influenza B, POC: NEGATIVE

## 2023-10-09 NOTE — Progress Notes (Signed)
 Virtual Visit via Video note  I connected with [name] on 10/14/23 at 1222 by video and verified that I am speaking with the correct person using two identifiers. [name] is currently located at in car parking lot and is currently alone during visit. The provider, Dana Allan, MD is located in their office at time of visit.  I discussed the limitations, risks, security and privacy concerns of performing an evaluation and management service by video and the availability of in person appointments. I also discussed with the patient that there may be a patient responsible charge related to this service. The patient expressed understanding and agreed to proceed.  Subjective: PCP: Dana Allan, MD  Chief Complaint  Patient presents with   Sinus Problem    Started  X 1 week Seen UC and they gave her doxcy. Mucus is green.   Cough    Covid 09/30/2023     HPI Discussed the use of AI scribe software for clinical note transcription with the patient, who gave verbal consent to proceed.  History of Present Illness [name] is a 48 year old female with type 1 diabetes who presents with dizziness and persistent symptoms following COVID-19 infection.  She contracted COVID-19 approximately a week before September 30, 2023, and was hospitalized after experiencing symptoms for two to three days. She did not receive COVID-specific medication as it was deemed too late for treatment. Since then, dizziness has persisted, which she attributes to the COVID-19 infection. She feels dehydrated and has been drinking more fluids, including Gatorade, to manage her symptoms. She also mentions experiencing dry lips and dizziness, which she associates with her high blood sugar levels and possible dehydration.  She was diagnosed with acute sinusitis during a video visit on October 06, 2023, and was prescribed doxycycline, which she is currently taking for a seven-day course. Despite this, she  reports not feeling significantly better and is concerned about the effectiveness of the treatment. She is experiencing 'junk in my sinuses and my head' and is coughing up green mucus, indicating ongoing sinus congestion.  She has type 1 diabetes with a recent A1c of 11, indicating poor glycemic control. Her blood sugars have been around 200, and she has been trying to manage them with increased insulin use. She is worried about the possibility of pneumonia despite the negative chest x-ray results. She has not been experiencing fevers but feels achy at times.  A recent chest x-ray did not show pneumonia, but she remains concerned about her lung health due to persistent coughing and sinus issues. She mentions smoking, which may be affecting her respiratory symptoms.  She is currently in a parking lot and has been feeling unwell, which has impacted her ability to bathe regularly.  ROS: Per HPI  Current Outpatient Medications:    amLODipine (NORVASC) 10 MG tablet, Take 1 tablet (10 mg total) by mouth daily., Disp: 90 tablet, Rfl: 0   atorvastatin (LIPITOR) 10 MG tablet, Take 1 tablet (10 mg total) by mouth at bedtime., Disp: 90 tablet, Rfl: 1   Blood Glucose Monitoring Suppl (ACCU-CHEK GUIDE ME) w/Device KIT, Use to check blood sugar daily, Disp: 1 kit, Rfl: 0   busPIRone (BUSPAR) 15 MG tablet, Take 15 mg by mouth 3 (three) times daily., Disp: , Rfl:    Cholecalciferol (VITAMIN D3) 50 MCG (2000 UT) capsule, Take 2,000 Units by mouth daily., Disp: , Rfl:    clonazePAM (KLONOPIN) 1 MG tablet, Take 1 tablet (1  mg total) by mouth 2 (two) times daily. Further refill by PCP, Disp: 60 tablet, Rfl: 0   Continuous Glucose Sensor (DEXCOM G6 SENSOR) MISC, 1 each by Does not apply route See admin instructions. Change sensor every 10 days; E10.9, Disp: 6 each, Rfl: 1   Continuous Glucose Sensor (DEXCOM G7 SENSOR) MISC, Change every 10 days, Disp: 3 each, Rfl: 3   Continuous Glucose Transmitter (DEXCOM G6  TRANSMITTER) MISC, Use as instructed to check blood sugars, change every 3 months., Disp: 1 each, Rfl: 3   gabapentin (NEURONTIN) 300 MG capsule, TAKE 1 CAPSULE BY MOUTH THREE TIMES A DAY AS NEEDED, Disp: 270 capsule, Rfl: 1   glucagon 1 MG injection, Inject 1 mg into the muscle once as needed., Disp: 1 each, Rfl: 12   insulin aspart (NOVOLOG) 100 UNIT/ML injection, INJECT 40 UNITS DAILY PER INSULIN PUMP (MAX DOSAGE), Disp: 30 mL, Rfl: 1   Insulin Pen Needle (PEN NEEDLES) 32G X 5 MM MISC, Use as instructed, Disp: 100 each, Rfl: 0   Multiple Vitamin (MULTIVITAMIN WITH MINERALS) TABS tablet, Take 1 tablet by mouth daily., Disp: , Rfl:    omeprazole (PRILOSEC) 40 MG capsule, Take 1 capsule (40 mg total) by mouth 2 (two) times daily. TAKE 1 CAPSULE TWICE A DAY. CALL [phone] TO SCHEDULE AN APPOINTMENT (NEEDS FOR FUTURE REFILLS), Disp: 60 capsule, Rfl: 0   ondansetron (ZOFRAN-ODT) 4 MG disintegrating tablet, Take 1 tablet (4 mg total) by mouth every 8 (eight) hours as needed for nausea or vomiting., Disp: 20 tablet, Rfl: 0   oxyCODONE-acetaminophen (PERCOCET) 5-325 MG tablet, Take 1 tablet by mouth every 4 (four) hours as needed for severe pain., Disp: 15 tablet, Rfl: 0   REXULTI 1 MG TABS tablet, Take 1 mg by mouth daily., Disp: , Rfl:    rOPINIRole (REQUIP) 1 MG tablet, TAKE 1 TABLET (1 MG TOTAL) BY MOUTH AT BEDTIME. TAKE 1 TABLET BY MOUTH AT BEDTIME, Disp: 90 tablet, Rfl: 3   sertraline (ZOLOFT) 100 MG tablet, Take 1.5 tablets (150 mg total) by mouth daily., Disp: 45 tablet, Rfl: 0   Tiotropium Bromide Monohydrate (SPIRIVA RESPIMAT) 2.5 MCG/ACT AERS, Inhale 2 puffs into the lungs daily at 2 PM., Disp: 4 g, Rfl: 2   VENTOLIN HFA 108 (90 Base) MCG/ACT inhaler, , Disp: , Rfl:    Vitamin D, Ergocalciferol, (DRISDOL) 1.25 MG (50000 UNIT) CAPS capsule, Take 1 capsule (50,000 Units total) by mouth every 7 (seven) days., Disp: 12 capsule, Rfl: 1  Observations/Objective: Physical Exam HENT:     Nose:  Congestion present.  Pulmonary:     Effort: Pulmonary effort is normal.  Neurological:     Mental Status: She is alert and oriented to person, place, and time. Mental status is at baseline.  Psychiatric:        Mood and Affect: Mood normal.        Behavior: Behavior normal.        Thought Content: Thought content normal.        Judgment: Judgment normal.    Assessment and Plan: Acute non-recurrent maxillary sinusitis Assessment & Plan: Currently on doxycycline for acute sinusitis. Reports coughing up green mucus and sinus congestion, indicating ongoing infection. Doxycycline is expected to address the bacterial component. - Continue doxycycline for the full 7-day course.   Lab test positive for detection of COVID-19 virus Assessment & Plan: Recently seen in ED, 03/16, tested positive at that time. Symptomatically treated.  No antivirals.  Persistent symptoms include dizziness, sinus  congestion, and cough. In no acute respiratory distress during televisit.  Dizziness may be related to dehydration, hyperglycemia, or hyponatremia. - Encourage increased fluid intake. - Advise close monitoring of blood glucose levels and adjust insulin as needed. - Recommend reducing smoking. - Consider emergency department visit if symptoms worsen.  Orders: -     POC Influenza A&B(BINAX/QUICKVUE)  Thick nasal mucus  Type 1 diabetes mellitus with complications (HCC) Assessment & Plan: Poorly controlled with recent HbA1c of 11%. Blood glucose levels around 200 mg/dL, contributing to dizziness and complicating recovery from COVID-19 and sinusitis. - Advise close monitoring of blood glucose levels and adjust insulin dosage as needed. - Encourage increased fluid intake. - If continues to have elevated blood glucose recommend ED evaluation for insulin management - Advise to schedule appointment with Endocrinology as soon as possible for review of medication   Hyponatremia Assessment & Plan: Chronic  hyponatremia likely secondary to hyperglycemia and psychiatry medication.  Low sodium levels at 120 mmol/L, contributing to dizziness and malaise. Reports dry lips, indicating dehydration. - Encourage increased fluid intake with electrolyte solutions. - Consider emergency department visit for further evaluation and potential IV fluids if symptoms persist or worsen.      Follow Up Instructions: Return if symptoms worsen or fail to improve, for PCP.   I discussed the assessment and treatment plan with the patient. The patient was provided an opportunity to ask questions and all were answered. The patient agreed with the plan and demonstrated an understanding of the instructions.   The patient was advised to call back or seek an in-person evaluation if the symptoms worsen or if the condition fails to improve as anticipated.  The above assessment and management plan was discussed with the patient. The patient verbalized understanding of and has agreed to the management plan. Patient is aware to call the clinic if symptoms persist or worsen. Patient is aware when to return to the clinic for a follow-up visit. Patient educated on when it is appropriate to go to the emergency department.     Dana Allan, MD

## 2023-10-14 ENCOUNTER — Encounter: Payer: Self-pay | Admitting: Family Medicine

## 2023-10-14 DIAGNOSIS — U071 COVID-19: Secondary | ICD-10-CM | POA: Insufficient documentation

## 2023-10-14 NOTE — Assessment & Plan Note (Signed)
 Recently seen in ED, 03/16, tested positive at that time. Symptomatically treated.  No antivirals.  Persistent symptoms include dizziness, sinus congestion, and cough. In no acute respiratory distress during televisit.  Dizziness may be related to dehydration, hyperglycemia, or hyponatremia. - Encourage increased fluid intake. - Advise close monitoring of blood glucose levels and adjust insulin as needed. - Recommend reducing smoking. - Consider emergency department visit if symptoms worsen.

## 2023-10-14 NOTE — Patient Instructions (Signed)
 It was a pleasure meeting you today. Thank you for allowing me to take part in your health care.  Our goals for today as we discussed include:  Continue current medication Probiotics daily while on antibiotics  If worsening symptoms ED for evaluation   If you have any questions or concerns, please do not hesitate to call the office at [phone].  I look forward to our next visit and until then take care and stay safe.  Regards,   Dana Allan, MD   [facility]

## 2023-10-14 NOTE — Assessment & Plan Note (Addendum)
 Chronic hyponatremia likely secondary to hyperglycemia and psychiatry medication.  Low sodium levels at 120 mmol/L, contributing to dizziness and malaise. Reports dry lips, indicating dehydration. - Encourage increased fluid intake with electrolyte solutions. - Consider emergency department visit for further evaluation and potential IV fluids if symptoms persist or worsen.

## 2023-10-14 NOTE — Assessment & Plan Note (Signed)
 Currently on doxycycline for acute sinusitis. Reports coughing up green mucus and sinus congestion, indicating ongoing infection. Doxycycline is expected to address the bacterial component. - Continue doxycycline for the full 7-day course.

## 2023-10-14 NOTE — Assessment & Plan Note (Addendum)
 Poorly controlled with recent HbA1c of 11%. Blood glucose levels around 200 mg/dL, contributing to dizziness and complicating recovery from COVID-19 and sinusitis. - Advise close monitoring of blood glucose levels and adjust insulin dosage as needed. - Encourage increased fluid intake. - If continues to have elevated blood glucose recommend ED evaluation for insulin management - Advise to schedule appointment with Endocrinology as soon as possible for review of medication

## 2023-10-15 ENCOUNTER — Ambulatory Visit: Payer: MEDICAID | Admitting: Family Medicine

## 2023-10-17 ENCOUNTER — Other Ambulatory Visit: Payer: Self-pay | Admitting: Family Medicine

## 2023-10-17 ENCOUNTER — Other Ambulatory Visit: Payer: Self-pay | Admitting: Gastroenterology

## 2023-10-17 DIAGNOSIS — F39 Unspecified mood [affective] disorder: Secondary | ICD-10-CM

## 2023-10-19 ENCOUNTER — Telehealth: Payer: Self-pay | Admitting: Family Medicine

## 2023-10-19 ENCOUNTER — Ambulatory Visit: Payer: MEDICAID | Admitting: Family Medicine

## 2023-10-19 ENCOUNTER — Encounter: Payer: Self-pay | Admitting: Family Medicine

## 2023-10-19 VITALS — BP 116/64 | HR 88 | Temp 98.2°F | Resp 20 | Ht 64.0 in | Wt 120.2 lb

## 2023-10-19 DIAGNOSIS — J4489 Other specified chronic obstructive pulmonary disease: Secondary | ICD-10-CM | POA: Diagnosis not present

## 2023-10-19 DIAGNOSIS — Z8709 Personal history of other diseases of the respiratory system: Secondary | ICD-10-CM

## 2023-10-19 DIAGNOSIS — F39 Unspecified mood [affective] disorder: Secondary | ICD-10-CM | POA: Diagnosis not present

## 2023-10-19 DIAGNOSIS — B379 Candidiasis, unspecified: Secondary | ICD-10-CM | POA: Diagnosis not present

## 2023-10-19 DIAGNOSIS — Z720 Tobacco use: Secondary | ICD-10-CM

## 2023-10-19 MED ORDER — CLONAZEPAM 1 MG PO TABS: 1.0000 mg | ORAL_TABLET | Freq: Two times a day (BID) | ORAL | 1 refills | Status: DC

## 2023-10-19 MED ORDER — BREZTRI AEROSPHERE 160-9-4.8 MCG/ACT IN AERO: 2.0000 | INHALATION_SPRAY | Freq: Two times a day (BID) | RESPIRATORY_TRACT | Status: DC

## 2023-10-19 MED ORDER — FLUCONAZOLE 150 MG PO TABS: 150.0000 mg | ORAL_TABLET | Freq: Every day | ORAL | 0 refills | Status: DC

## 2023-10-19 NOTE — Telephone Encounter (Signed)
 Copied from CRM [id number]. Topic: Clinical - Medication Refill >> Oct 19, 2023  5:24 PM [place] J wrote: Most Recent Primary Care Visit:  Provider: Dana Allan  Department: [facility]  Visit Type: ACUTE  Date: 10/09/2023  Medication: clonazePAM (KLONOPIN) 1 MG  Has the patient contacted their pharmacy? Yes (Agent: If no, request that the patient contact the pharmacy for the refill. If patient does not wish to contact the pharmacy document the reason why and proceed with request.) (Agent: If yes, when and what did the pharmacy advise?) Pharmacy stated that action is needed.  Is this the correct pharmacy for this prescription? Yes If no, delete pharmacy and type the correct one.  This is the patient's preferred pharmacy:  [id number] [place], [place] - [address] [place] [place] [address] Phone: [phone] Fax: [phone]  Has the prescription been filled recently? No  Is the patient out of the medication? Yes  Has the patient been seen for an appointment in the last year OR does the patient have an upcoming appointment? Yes  Can we respond through MyChart? Yes  Agent: Please be advised that Rx refills may take up to 3 business days. We ask that you follow-up with your pharmacy.

## 2023-10-19 NOTE — Patient Instructions (Addendum)
 It was a pleasure meeting you today. Thank you for allowing me to take part in your health care.  Our goals for today as we discussed include:  Start Breztri 2 puffs 2 times a day Stop the Spiriva and can restart after Breztri completed  Congratulations on decreasing smoking.  Continue your journey to a smoke free lifestyle 1-800-QUITNOW has assistance for smoking cessation  Refill sent for Clonazepam 1 mg two times a day.  Can take 1 tablet in the morning, half tablet afternoon and half tablet at night  Take Buspar 15 mg in morning and 30 mg in evening.  Follow up in 2 weeks   This is a list of the screening recommended for you and due dates:  Health Maintenance  Topic Date Due   DTaP/Tdap/Td vaccine (2 - Tdap) 07/18/2009   Colon Cancer Screening  04/14/2021   Complete foot exam   01/06/2022   COVID-19 Vaccine (1 - 2024-25 season) Never done   Eye exam for diabetics  09/19/2023   Pneumococcal Vaccination (1 of 2 - PCV) 08/22/2024*   Yearly kidney health urinalysis for diabetes  01/08/2024   Flu Shot  02/15/2024   Hemoglobin A1C  03/12/2024   Pap with HPV screening  05/29/2024   Yearly kidney function blood test for diabetes  09/29/2024   Hepatitis C Screening  Completed   HIV Screening  Completed   HPV Vaccine  Aged Out  *Topic was postponed. The date shown is not the original due date.     If you have any questions or concerns, please do not hesitate to call the office at [phone].  I look forward to our next visit and until then take care and stay safe.  Regards,   Valli Gaw, MD   [facility]

## 2023-10-19 NOTE — Progress Notes (Unsigned)
 SUBJECTIVE:   Chief Complaint  Patient presents with   Nasal Congestion   Ear Pain    Both ears x 2-3 weeks   HPI Presents for acute visit  Discussed the use of AI scribe software for clinical note transcription with the patient, who gave verbal consent to proceed.  History of Present Illness [name] is a 48 year old female who presents for follow-up after COVID-19 infection.  She has experienced significant improvement in her anxiety since starting clonazepam and Buspar. She takes clonazepam 1 mg in the morning, 0.5 mg in the afternoon, and 0.5 mg in the evening. Buspar is taken at 15 mg twice a day, although she often forgets the third prescribed dose. Her anxiety has calmed significantly, allowing better social interactions and fewer panic attacks.  She has a history of asthma, which is exacerbated by smoking. She continues to smoke but has reduced her intake to two packs a day from two and a half packs. She uses a Respimat inhaler and needs to follow up with her pulmonologist. She describes coughing up green sputum and having nasal congestion with green discharge, attributing these symptoms to allergies and smoking. No fever is present.  She mentions a possible yeast infection, likely due to recent antibiotic use, with symptoms of itching and discomfort in the oral cavity, likening it to the feeling of a cut from a popsicle.  She has a history of diabetes, which is not well-controlled. She acknowledges that her diet and recent antibiotic use may be contributing to her feeling unwell. She notes that her car is covered in pollen, indicating significant environmental allergies.    PERTINENT PMH / PSH: As above  OBJECTIVE:  BP 116/64   Pulse 88   Temp 98.2 F (36.8 C)   Resp 20   Ht 5\' 4"  (1.626 m)   Wt 120 lb 4 oz (54.5 kg)   SpO2 99%   BMI 20.64 kg/m    Physical Exam Vitals reviewed.  Constitutional:      General: She is not in acute distress.     Appearance: Normal appearance. She is normal weight. She is not ill-appearing, toxic-appearing or diaphoretic.  Eyes:     General:        Right eye: No discharge.        Left eye: No discharge.     Conjunctiva/sclera: Conjunctivae normal.  Cardiovascular:     Rate and Rhythm: Normal rate and regular rhythm.     Heart sounds: Normal heart sounds.  Pulmonary:     Effort: Pulmonary effort is normal.     Breath sounds: Normal breath sounds. No wheezing or rhonchi.  Abdominal:     General: Bowel sounds are normal.  Musculoskeletal:        General: Normal range of motion.  Skin:    General: Skin is warm and dry.  Neurological:     General: No focal deficit present.     Mental Status: She is alert and oriented to person, place, and time. Mental status is at baseline.  Psychiatric:        Mood and Affect: Mood normal.        Behavior: Behavior normal.        Thought Content: Thought content normal.        Judgment: Judgment normal.    {Perform Simple Foot Exam  Perform Detailed exam:1} {Insert foot Exam (Optional):30965}      10/19/2023    3:32 PM 09/24/2023  2:00 PM 09/20/2023   11:53 AM 09/13/2023    3:00 PM 08/23/2023   11:53 AM  Depression screen PHQ 2/9  Decreased Interest 3 1 0 2 1  Down, Depressed, Hopeless 3 1 0 1 1  PHQ - 2 Score 6 2 0 3 2  Altered sleeping 1 2 0 2 0  Tired, decreased energy 2 1 0 1 0  Change in appetite 1 2 0 1 0  Feeling bad or failure about yourself  2 0 0 1 0  Trouble concentrating 0 0 0 2 0  Moving slowly or fidgety/restless 0 0 0 2 0  Suicidal thoughts 0 0 0 0 0  PHQ-9 Score 12 7 0 12 2  Difficult doing work/chores Somewhat difficult Somewhat difficult Not difficult at all Somewhat difficult Not difficult at all      10/19/2023    3:32 PM 09/24/2023    2:01 PM 09/20/2023   11:53 AM 09/13/2023    3:01 PM  GAD 7 : Generalized Anxiety Score  Nervous, Anxious, on Edge 0 2 0 2  Control/stop worrying 1 3 0 2  Worry too much - different things 1 3 0  2  Trouble relaxing 1 2 0 1  Restless 0 0 0 1  Easily annoyed or irritable 0 1 0 2  Afraid - awful might happen 1 1 0 2  Total GAD 7 Score 4 12 0 12  Anxiety Difficulty Somewhat difficult Somewhat difficult Not difficult at all Somewhat difficult    ASSESSMENT/PLAN:  Mood disorder (HCC) Assessment & Plan: Anxiety improved with clonazepam and Buspar. Adjusted Buspar dosage to enhance anxiety control and reduce clonazepam reliance. Awaiting psychiatry and therapy appointments. - Refill clonazepam 1 mg BID - Adjust Buspar to 15 mg in the morning and 30 mg in the evening. - Continue Zoloft 150 mg daily - Consider adding Rexulti - Attend upcoming psychiatry and therapy appointments.   Orders: -     clonazePAM; Take 1 tablet (1 mg total) by mouth 2 (two) times daily. Further refill by PCP  Dispense: 60 tablet; Refill: 1  Candida infection Assessment & Plan: Symptoms consistent with yeast infection, likely due to recent antibiotic use. - Prescribe Diflucan for yeast infection.  Orders: -     Fluconazole; Take 1 tablet (150 mg total) by mouth daily.  Dispense: 1 tablet; Refill: 0  Asthma-COPD overlap syndrome (HCC) Assessment & Plan: Asthma possibly worsened by smoking and allergies. Current treatment with Respimat inhaler. Discussed Breztri inhaler trial for lung secretion clearance. - Continue using Respimat inhaler. - Schedule appointment with pulmonologist. - Trial Breztri inhaler, two puffs twice a day, and monitor for adverse effects. - Rinse mouth after using Breztri inhaler. - Reduce smoking further.  Orders: -     Breztri Aerosphere; Inhale 2 puffs into the lungs in the morning and at bedtime.  Hx of sinusitis Assessment & Plan: Competed course of doxycycline.  Continues to have green nasal discharge and post-nasal drip likely due to sinusitis and allergies. No bacterial infection suspected. Afebrile, no sinus pressure or headache - Prescribe Flonase for nasal  symptoms. - Consider allergy medication if symptoms persist.   Tobacco use Assessment & Plan: Advised on smoking reduction to improve lung health and potential recovery benefits with cessation. - Encourage smoking cessation. - Discuss long-term benefits of quitting smoking.      PDMP reviewed  Return if symptoms worsen or fail to improve, for PCP.  Valli Gaw, MD

## 2023-10-19 NOTE — Progress Notes (Deleted)
   Acute Office Visit  Subjective:     Patient ID: [name], female    DOB: Feb 13, 1976, 48 y.o.   MRN: [id number]  Chief Complaint  Patient presents with   Nasal Congestion   Ear Pain    Both ears x 2-3 weeks    HPI Patient is in today for ***  ROS      Objective:    BP 116/64   Pulse 88   Temp 98.2 F (36.8 C)   Resp 20   Ht 5\' 4"  (1.626 m)   Wt 120 lb 4 oz (54.5 kg)   SpO2 99%   BMI 20.64 kg/m  {Vitals History (Optional):23777}  Physical Exam  No results found for any visits on 10/19/23.      Assessment & Plan:   Problem List Items Addressed This Visit       Respiratory   Asthma-COPD overlap syndrome (HCC)   Relevant Medications   budeson-glycopyrrolate-formoterol (BREZTRI AEROSPHERE) 160-9-4.8 MCG/ACT AERO     Other   Mood disorder (HCC)   Relevant Medications   clonazePAM (KLONOPIN) 1 MG tablet   Other Visit Diagnoses       Candida infection    -  Primary   Relevant Medications   fluconazole (DIFLUCAN) 150 MG tablet       Meds ordered this encounter  Medications   clonazePAM (KLONOPIN) 1 MG tablet    Sig: Take 1 tablet (1 mg total) by mouth 2 (two) times daily. Further refill by PCP    Dispense:  60 tablet    Refill:  1   fluconazole (DIFLUCAN) 150 MG tablet    Sig: Take 1 tablet (150 mg total) by mouth daily.    Dispense:  1 tablet    Refill:  0   budeson-glycopyrrolate-formoterol (BREZTRI AEROSPHERE) 160-9-4.8 MCG/ACT AERO    Sig: Inhale 2 puffs into the lungs in the morning and at bedtime.    Return if symptoms worsen or fail to improve, for PCP.  Vertis Kelch, CMA

## 2023-10-19 NOTE — Progress Notes (Signed)
 Medication Samples have been provided to the patient.  Drug name: Breztri       Strength: 143mcg/9mcg/4.8mcg Per inhalation        Qty: 1  LOT: [id number] Exp.Date: [id number]  Dosing instructions: Inhale 2 puffs into the lungs in the morning and at bedtime.   The patient has been instructed regarding the correct time, dose, and frequency of taking this medication, including desired effects and most common side effects.   Tresa Endo  Kamori Barbier 3:44 PM 10/19/2023

## 2023-10-22 ENCOUNTER — Ambulatory Visit: Payer: MEDICAID | Admitting: Family Medicine

## 2023-10-22 NOTE — Telephone Encounter (Signed)
 Noted.

## 2023-10-22 NOTE — Telephone Encounter (Signed)
 [facility] 1mg  refill, confirmed [facility] and they report they are working on it now. No refill order needed at this time.

## 2023-10-31 ENCOUNTER — Encounter: Payer: Self-pay | Admitting: Family Medicine

## 2023-10-31 ENCOUNTER — Ambulatory Visit: Payer: MEDICAID | Admitting: Psychiatry

## 2023-10-31 DIAGNOSIS — B379 Candidiasis, unspecified: Secondary | ICD-10-CM | POA: Insufficient documentation

## 2023-10-31 NOTE — Assessment & Plan Note (Signed)
 Asthma possibly worsened by smoking and allergies. Current treatment with Respimat inhaler. Discussed Breztri inhaler trial for lung secretion clearance. - Continue using Respimat inhaler. - Schedule appointment with pulmonologist. - Trial Breztri inhaler, two puffs twice a day, and monitor for adverse effects. - Rinse mouth after using Breztri inhaler. - Reduce smoking further.

## 2023-10-31 NOTE — Assessment & Plan Note (Signed)
 Anxiety improved with clonazepam and Buspar. Adjusted Buspar dosage to enhance anxiety control and reduce clonazepam reliance. Awaiting psychiatry and therapy appointments. - Refill clonazepam 1 mg BID - Adjust Buspar to 15 mg in the morning and 30 mg in the evening. - Continue Zoloft 150 mg daily - Consider adding Rexulti - Attend upcoming psychiatry and therapy appointments.

## 2023-10-31 NOTE — Assessment & Plan Note (Addendum)
 Competed course of doxycycline.  Continues to have green nasal discharge and post-nasal drip likely due to sinusitis and allergies. No bacterial infection suspected. Afebrile, no sinus pressure or headache - Prescribe Flonase for nasal symptoms. - Consider allergy medication if symptoms persist.

## 2023-10-31 NOTE — Assessment & Plan Note (Signed)
 Symptoms consistent with yeast infection, likely due to recent antibiotic use. - Prescribe Diflucan for yeast infection.

## 2023-10-31 NOTE — Assessment & Plan Note (Signed)
 Advised on smoking reduction to improve lung health and potential recovery benefits with cessation. - Encourage smoking cessation. - Discuss long-term benefits of quitting smoking.

## 2023-11-05 ENCOUNTER — Other Ambulatory Visit: Payer: Self-pay | Admitting: Family Medicine

## 2023-11-05 MED ORDER — REXULTI 1 MG PO TABS: 1.0000 mg | ORAL_TABLET | Freq: Every day | ORAL | 0 refills | Status: DC

## 2023-11-05 NOTE — Telephone Encounter (Signed)
 Copied from CRM [id number]. Topic: Clinical - Medication Refill >> Nov 05, 2023 12:53 PM Star East wrote: Most Recent Primary Care Visit:  Provider: WALSH, TANYA  Department: [facility]  Visit Type: ACUTE  Date: 10/19/2023  Medication: REXULTI  1 MG TABS tablet  Has the patient contacted their pharmacy? No (Agent: If no, request that the patient contact the pharmacy for the refill. If patient does not wish to contact the pharmacy document the reason why and proceed with request.) (Agent: If yes, when and what did the pharmacy advise?)  Is this the correct pharmacy for this prescription? Yes If no, delete pharmacy and type the correct one.  This is the patient's preferred pharmacy:  [id number] [place], [place] - [address] [place] [address] Phone: [phone] Fax: [phone]    Has the prescription been filled recently? Yes  Is the patient out of the medication? No  Has the patient been seen for an appointment in the last year OR does the patient have an upcoming appointment? Yes  Can we respond through MyChart? Yes  Agent: Please be advised that Rx refills may take up to 3 business days. We ask that you follow-up with your pharmacy.

## 2023-11-08 ENCOUNTER — Ambulatory Visit: Payer: MEDICAID | Admitting: Psychiatry

## 2023-11-09 ENCOUNTER — Ambulatory Visit: Payer: MEDICAID | Admitting: Nurse Practitioner

## 2023-11-14 ENCOUNTER — Encounter: Payer: Self-pay | Admitting: Psychiatry

## 2023-11-14 ENCOUNTER — Ambulatory Visit (INDEPENDENT_AMBULATORY_CARE_PROVIDER_SITE_OTHER): Payer: MEDICAID | Admitting: Psychiatry

## 2023-11-14 ENCOUNTER — Other Ambulatory Visit: Payer: Self-pay

## 2023-11-14 VITALS — BP 176/90 | HR 102 | Temp 98.2°F | Ht 64.0 in | Wt 120.2 lb

## 2023-11-14 DIAGNOSIS — F410 Panic disorder [episodic paroxysmal anxiety] without agoraphobia: Secondary | ICD-10-CM | POA: Diagnosis not present

## 2023-11-14 DIAGNOSIS — F4321 Adjustment disorder with depressed mood: Secondary | ICD-10-CM

## 2023-11-14 DIAGNOSIS — F1320 Sedative, hypnotic or anxiolytic dependence, uncomplicated: Secondary | ICD-10-CM

## 2023-11-14 DIAGNOSIS — F332 Major depressive disorder, recurrent severe without psychotic features: Secondary | ICD-10-CM

## 2023-11-14 MED ORDER — SERTRALINE HCL 100 MG PO TABS: 100.0000 mg | ORAL_TABLET | Freq: Every day | ORAL | 2 refills | Status: AC

## 2023-11-14 MED ORDER — BUSPIRONE HCL 15 MG PO TABS
15.0000 mg | ORAL_TABLET | Freq: Three times a day (TID) | ORAL | 2 refills | Status: DC
Start: 2023-11-14 — End: 2024-05-12

## 2023-11-14 MED ORDER — BREXPIPRAZOLE 2 MG PO TABS: 2.0000 mg | ORAL_TABLET | Freq: Every day | ORAL | 0 refills | Status: DC

## 2023-11-14 MED ORDER — CLONAZEPAM 0.5 MG PO TABS: 0.5000 mg | ORAL_TABLET | Freq: Two times a day (BID) | ORAL | 0 refills | Status: DC
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
## 2023-11-15 ENCOUNTER — Other Ambulatory Visit: Payer: Self-pay

## 2023-11-15 ENCOUNTER — Telehealth: Payer: Self-pay | Admitting: Endocrinology

## 2023-11-15 DIAGNOSIS — E1065 Type 1 diabetes mellitus with hyperglycemia: Secondary | ICD-10-CM

## 2023-11-15 MED ORDER — DEXCOM G7 SENSOR MISC: 3 refills | Status: AC

## 2023-11-15 NOTE — Telephone Encounter (Signed)
 MEDICATION: Dexcom G7 sensor   [facility] [place]  HAS THE PATIENT CONTACTED THEIR PHARMACY?  YES  IS THIS A 90 DAY SUPPLY : unknown  IS PATIENT OUT OF MEDICATION: YES  IF NOT; HOW MUCH IS LEFT: NONE  LAST APPOINTMENT DATE: @11 /14/2024  NEXT APPOINTMENT DATE:@05 /13/2025  DO WE HAVE YOUR PERMISSION TO LEAVE A DETAILED MESSAGE?: yes  OTHER COMMENTS:    **Let patient know to contact pharmacy at the end of the day to make sure medication is ready. **  ** Please notify patient to allow 48-72 hours to process**  **Encourage patient to contact the pharmacy for refills or they can request refills through [facility]**   [facility] G[facility] [place]

## 2023-11-22 ENCOUNTER — Ambulatory Visit: Payer: MEDICAID | Admitting: Psychiatry

## 2023-11-26 ENCOUNTER — Ambulatory Visit (INDEPENDENT_AMBULATORY_CARE_PROVIDER_SITE_OTHER): Payer: MEDICAID | Admitting: Family Medicine

## 2023-11-26 ENCOUNTER — Encounter: Payer: Self-pay | Admitting: Family Medicine

## 2023-11-26 VITALS — BP 126/68 | HR 82 | Temp 97.9°F | Resp 20 | Ht 64.0 in | Wt 120.1 lb

## 2023-11-26 DIAGNOSIS — F39 Unspecified mood [affective] disorder: Secondary | ICD-10-CM

## 2023-11-26 MED ORDER — CLONAZEPAM 1 MG PO TABS: 1.0000 mg | ORAL_TABLET | Freq: Two times a day (BID) | ORAL | 0 refills | Status: DC

## 2023-11-26 NOTE — Progress Notes (Signed)
 SUBJECTIVE:   Chief Complaint  Patient presents with  . Anxiety   HPI Follow up mood disorder  Discussed the use of AI scribe software for clinical note transcription with the patient, who gave verbal consent to proceed.  History of Present Illness [name] is a 48 year old female who presents with medication management concerns related to anxiety and depression.  She recently visited a nurse practitioner in [place] for psychiatric evaluation and was informed that the practice does not prescribe benzodiazepines. She was advised to wean off Klonopin , which she has been taking, and was given a prescription to help with the tapering process. She feels misled by the practitioner, who initially seemed supportive but later emphasized the need to discontinue Klonopin .  She has been taking Klonopin  1 mg twice daily, occasionally using an extra dose, totaling 1.5 mg per day. The reduction in Klonopin  has significantly impacted her ability to function, causing her to miss family events such as Mother's Day due to increased anxiety and depression.  Her current medication regimen includes sertraline  (Zoloft ) 100 mg daily, which she previously suggested increasing due to past experiences of worsening depression when tapering off. She also takes Rexulti , which was increased to 2 mg, but she has not been taking the increased dose as she was unaware of the change. Additionally, she takes Buspar , which was reduced to 15 mg three times daily, and hydroxyzine  as needed.  She mentions a history of panic attacks and anxiety, which she attributes to specific triggers rather than caffeine intake, despite a recent suggestion by a practitioner that her Diet Coke consumption could be a factor.  She expresses frustration with the current management of her medications and the impact on her mental health, feeling 'all screwed up' and 'back to the beginning' after the recent changes. She is seeking assistance  in finding a provider who can better manage her psychiatric medications.     PERTINENT PMH / PSH: As above  OBJECTIVE:  BP 126/68   Pulse 82   Temp 97.9 F (36.6 C)   Resp 20   Ht 5\' 4"  (1.626 m)   Wt 120 lb 2 oz (54.5 kg)   SpO2 99%   BMI 20.62 kg/m    Physical Exam Vitals reviewed.  Constitutional:      General: She is not in acute distress.    Appearance: Normal appearance. She is normal weight. She is not ill-appearing, toxic-appearing or diaphoretic.  Eyes:     General:        Right eye: No discharge.        Left eye: No discharge.     Conjunctiva/sclera: Conjunctivae normal.  Cardiovascular:     Rate and Rhythm: Normal rate and regular rhythm.     Heart sounds: Normal heart sounds.  Pulmonary:     Effort: Pulmonary effort is normal.     Breath sounds: Normal breath sounds.  Abdominal:     General: Bowel sounds are normal.  Musculoskeletal:        General: Normal range of motion.  Skin:    General: Skin is warm and dry.  Neurological:     General: No focal deficit present.     Mental Status: She is alert and oriented to person, place, and time. Mental status is at baseline.  Psychiatric:        Attention and Perception: Attention normal.        Mood and Affect: Mood is anxious. Affect is tearful.  Behavior: Behavior normal. Behavior is cooperative.        Thought Content: Thought content normal.        Judgment: Judgment normal.          11/26/2023    2:13 PM 10/19/2023    3:32 PM 09/24/2023    2:00 PM 09/20/2023   11:53 AM 09/13/2023    3:00 PM  Depression screen PHQ 2/9  Decreased Interest 2 3 1  0 2  Down, Depressed, Hopeless 0 3 1 0 1  PHQ - 2 Score 2 6 2  0 3  Altered sleeping 0 1 2 0 2  Tired, decreased energy 0 2 1 0 1  Change in appetite 1 1 2  0 1  Feeling bad or failure about yourself  1 2 0 0 1  Trouble concentrating 0 0 0 0 2  Moving slowly or fidgety/restless 1 0 0 0 2  Suicidal thoughts 0 0 0 0 0  PHQ-9 Score 5 12 7  0 12   Difficult doing work/chores Very difficult Somewhat difficult Somewhat difficult Not difficult at all Somewhat difficult      11/26/2023    2:13 PM 10/19/2023    3:32 PM 09/24/2023    2:01 PM 09/20/2023   11:53 AM  GAD 7 : Generalized Anxiety Score  Nervous, Anxious, on Edge 3 0 2 0  Control/stop worrying 3 1 3  0  Worry too much - different things 3 1 3  0  Trouble relaxing 3 1 2  0  Restless 2 0 0 0  Easily annoyed or irritable 2 0 1 0  Afraid - awful might happen 3 1 1  0  Total GAD 7 Score 19 4 12  0  Anxiety Difficulty Extremely difficult Somewhat difficult Somewhat difficult Not difficult at all    ASSESSMENT/PLAN:  Mood disorder (HCC) Assessment & Plan: Significant anxiety exacerbated by changes in psychiatric care. Klonopin  weaning increased anxiety. Patient is willing to wean off Benzos, will require slow weaning process to avoid increasing anxiety and panic attack. - Restart Klonopin  to 1 mg twice daily.  - Plan to start Depakote at next visit - Start Rexulti  2 mg as recommended by psychiatry.  - Continue Buspar  15 mg TID - Continue Zoloft  100 mg daily - Follow up in one week to reassess.   Orders: -     clonazePAM ; Take 1 tablet (1 mg total) by mouth 2 (two) times daily. Further refill by PCP  Dispense: 60 tablet; Refill: 0    PDMP reviewed  Return in about 1 week (around 12/03/2023) for PCP.  Valli Gaw, MD

## 2023-11-26 NOTE — Patient Instructions (Addendum)
 It was a pleasure meeting you today. Thank you for allowing me to take part in your health care.  Our goals for today as we discussed include:  Increase Klonipin to 1 mg two times a day.  Please be cautious with this.  No refills before next month Continue Rexulti  2 mg daily. Continue Sertraline  100 mg daily Continue Buspar  15 mg three times a day  Follow up in 1 week Plan to make some adjustments to medications  Recommend you call to schedule appointment with Dr Edwyna Grams at [facility] office [address] [place], [place] [address] Tel: [phone]  [email or web address] Can go online to schedule appointment     This is a list of the screening recommended for you and due dates:  Health Maintenance  Topic Date Due   DTaP/Tdap/Td vaccine (2 - Tdap) 07/18/2009   Colon Cancer Screening  04/14/2021   Complete foot exam   01/06/2022   COVID-19 Vaccine (1 - 2024-25 season) Never done   Eye exam for diabetics  09/19/2023   Pneumococcal Vaccination (1 of 2 - PCV) 08/22/2024*   Yearly kidney health urinalysis for diabetes  01/08/2024   Flu Shot  02/15/2024   Hemoglobin A1C  03/12/2024   Pap with HPV screening  05/29/2024   Yearly kidney function blood test for diabetes  09/29/2024   Hepatitis C Screening  Completed   HIV Screening  Completed   HPV Vaccine  Aged Out   Meningitis B Vaccine  Aged Out  *Topic was postponed. The date shown is not the original due date.      If you have any questions or concerns, please do not hesitate to call the office at [phone].  I look forward to our next visit and until then take care and stay safe.  Regards,   Valli Gaw, MD   [facility]

## 2023-11-27 ENCOUNTER — Ambulatory Visit: Payer: Self-pay | Admitting: Endocrinology

## 2023-11-27 ENCOUNTER — Encounter: Payer: Self-pay | Admitting: Endocrinology

## 2023-11-27 ENCOUNTER — Ambulatory Visit (INDEPENDENT_AMBULATORY_CARE_PROVIDER_SITE_OTHER): Payer: MEDICAID | Admitting: Endocrinology

## 2023-11-27 VITALS — BP 118/60 | HR 99 | Resp 20 | Ht 64.0 in | Wt 119.8 lb

## 2023-11-27 DIAGNOSIS — E1065 Type 1 diabetes mellitus with hyperglycemia: Secondary | ICD-10-CM | POA: Diagnosis not present

## 2023-11-27 LAB — POCT GLYCOSYLATED HEMOGLOBIN (HGB A1C): Hemoglobin A1C: 9.6 % — AB (ref 4.0–5.6)

## 2023-11-27 NOTE — Progress Notes (Signed)
 Outpatient Endocrinology Note [place] Kathalene Sporer, MD  11/27/23  Patient's Name: [name]    DOB: 1976-06-16    MRN: [id number]                                                    REASON OF VISIT: Follow up of type 1 diabetes mellitus  PCP: Valli Gaw, MD  HISTORY OF PRESENT ILLNESS:   [name] is a 48 y.o. old female with past medical history listed below, is here for follow up of type 1 diabetes mellitus.    Pertinent Diabetes History: Patient was previously seen by Dr. Hubert Madden and was last time seen in June 2024.  Patient was diagnosed with type 1 diabetes mellitus in 1989.  She has been on insulin  pump for several years including t:slim pump since 2017.  She has uncontrolled type 1 diabetes mellitus.  Chronic Diabetes Complications : Retinopathy: ? yes,. Last ophthalmology exam was done on ? 09/2022, following with ophthalmology regularly.  Nephropathy: no Peripheral neuropathy: no Coronary artery disease: no Stroke: on  Relevant comorbidities and cardiovascular risk factors: Obesity: no Body mass index is 20.56 kg/m.  Hypertension: Yes  Hyperlipidemia : Yes, on statin.   Current / Home Diabetic regimen includes:  Tandem t:slim pump, on Dexcom G7 using NovoLog  U100  Insulin  Pump setting:  Basal ( total 2.4 units/day) MN- 0.1u/hour  Bolus CHO Ratio (1unit:CHO) MN- 1:1, she has been mainly correcting manually with insulin  dose of 0.5 to 2 units at a time, without carb counting.  Bolusing for meals 3-6 times a day.  Correction/Sensitivity: MN- 1:500  Target: 150  Active insulin  time: 5 hours  Prior diabetic medications:   CONTINUOUS GLUCOSE MONITORING SYSTEM (CGMS) / INSULIN  PUMP INTERPRETATION:                         Tandem Pump & Sensor Download (Reviewed and summarized below.)  Average total daily insulin :  6.35 units, Basal: 36% ( 2.32 units), Bolus: 64% (4.03 units),  100% manual bolus.   Control IQ Time in Use: 0%  Glycemic data:     CONTINUOUS GLUCOSE MONITORING SYSTEM (CGMS) INTERPRETATION: At today's visit, we reviewed CGM downloads. The full report is scanned in the media. Reviewing the CGM trends, blood glucose are as follows:  Dexcom G7 CGM-  Sensor Download (Sensor download was reviewed and summarized below.) Dates: April 32 Nov 27, 2023, 14 days  Glucose Management Indicator: 8.9% Sensor Average: 235 SD 50 CGM/Sensor usage:92%     Interpretation: Mostly hyperglycemia with blood sugar in the range of 200-300 range.  Hyperglycemia up to 300-350 range postprandially related to meals.  Blood sugar in between the meals mostly in the 200 range.  Blood sugar overnight mostly in the range of 170-200 range.  She had mostly acceptable blood sugar in the range of 100-180 range on May 4.  Rarely blood sugar in the range of 150-160 range in between the meals.  No hypoglycemia.  Hypoglycemia: Patient has no hypoglycemic episodes. Patient has hypoglycemia awareness.  Factors modifying glucose control: 1.  Diabetic diet assessment: 3 meals a day.  At times small small snacks.  2.  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  3.  Medication compliance: compliant most of the time.  HYPONATREMIA: This has  been present for several years. She also has been on sertraline . Not on any hydrochlorothiazide. Recently not drinking excessive amount of fluids.  Managed by primary care provider.  Interval history  Pump data reviewed and noted as above.  Dexcom CGM G7 data reviewed and as noted above, mostly hyperglycemia.  No hypoglycemia.  Patient on manual mode of tandem t:slim insulin  pump.  She has not upgraded application to be compatible with Dexcom G7 with the pump.  She reports he does not have a computer at home and cannot do it.  She had hemoglobin A1c of 11% in February and improved to 9.6% today.  GMI on CGM 8.9%.  Patient reports she was going to the depression and with the medications she has been  gradually improving, and would like to better care of her diabetes.  No other complaints today.  REVIEW OF SYSTEMS As per history of present illness.   PAST MEDICAL HISTORY: Past Medical History:  Diagnosis Date   Absence of bladder continence 04/01/2010   Overview:   Urinary Loss Of Control   Acid reflux 04/01/2010   Overview:    Acute gastroenteritis 03/14/2022   Anxiety state, unspecified 03/26/2009   Arthralgia 08/14/2018   ASTHMA 02/02/2007   Asthma    Chest wall pain 08/02/2018   DEPRESSION 02/16/2009   DIABETES MELLITUS, TYPE I 02/02/2007   Dizziness 09/18/2018   Eczema 08/02/2018   Elevated cholesterol 06/02/2022   Encounter to establish care 06/02/2022   Endometriosis    Essential hypertension    GERD 12/21/2008   Herpes    at 18   Hyperlipidemia    Hypertension 03/03/2020   Lesion of vocal cord    Mild intermittent asthma without complication 08/12/2018   Osteoarthritis    Peripheral polyneuropathy 11/29/2017   Primary osteoarthritis of both feet 11/30/2019   Primary osteoarthritis of both hands 11/30/2019   Bilateral mild osteoarthritis.   Retinal hemorrhage of right eye 11/04/2019   Severe nonproliferative diabetic retinopathy of right eye, without macular edema, associated with type 1 diabetes mellitus (HCC) 11/04/2019   Shortness of breath 09/25/2007   Uncontrolled type 1 diabetes mellitus with hyperglycemia, with long-term current use of insulin  (HCC) 03/14/2022          Uncontrolled type 2 diabetes mellitus with hyperglycemia, with long-term current use of insulin  (HCC) 03/14/2022   Vitreous hemorrhage of right eye (HCC) 08/16/2021   Vocal fold leukoplakia     PAST SURGICAL HISTORY: Past Surgical History:  Procedure Laterality Date   ABDOMINAL HYSTERECTOMY  2009   BSO   DIRECT LARYNGOSCOPY N/A 08/17/2017   Procedure: DIRECT LARYNGOSCOPY WITH OPERATING TELESCOPE WITH BIOPSY;  Surgeon: Eldon Greenland, MD;  Location: [facility] OR;  Service: ENT;   Laterality: N/A;   NASOPHARYNGEAL BIOPSY Right 08/17/2017   Procedure: NASOPHARYNGEAL BIOPSY RIGHT NASAL LESION;  Surgeon: Eldon Greenland, MD;  Location: [facility] OR;  Service: ENT;  Laterality: Right;    ALLERGIES: No Known Allergies  FAMILY HISTORY:  Family History  Problem Relation Age of Onset   COPD Mother    Arthritis Mother    Diabetes Mother    Dementia Mother 62   COPD Father    Cancer Father    Arthritis Father    Diabetes Father    Diabetes type I Brother    Diabetes Maternal Grandfather    Diabetes Maternal Aunt    Emphysema Maternal Uncle    Colon cancer Neg Hx    Kidney disease Neg Hx  Liver disease Neg Hx    Esophageal cancer Neg Hx    Rectal cancer Neg Hx    Stomach cancer Neg Hx     SOCIAL HISTORY: Social History   Socioeconomic History   Marital status: Legally Separated    Spouse name: Not on file   Number of children: 2   Years of education: Not on file   Highest education level: GED or equivalent  Occupational History    Employer: UNEMPLOYED  Tobacco Use   Smoking status: Every Day    Current packs/day: 1.50    Average packs/day: 1.5 packs/day for 31.0 years (46.5 ttl pk-yrs)    Types: Cigarettes   Smokeless tobacco: Never   Tobacco comments:    11/03/2021 -  states she smokes a pack and a half a day  Vaping Use   Vaping status: Former   Quit date: 02/23/2017   Devices: only used tobacco   Substance and Sexual Activity   Alcohol use: Not Currently    Alcohol/week: 0.0 standard drinks of alcohol   Drug use: No   Sexual activity: Yes    Partners: Male    Birth control/protection: None    Comment: 1st intercourse-14, partners- 15, current partner- 4 months  Other Topics Concern   Not on file  Social History Narrative   2 children- boy 37, and girl  25 years.             Daily Caffeine Use-3 2 liters a dayPt does not get regular exercise.      Widowed 06-Dec-2021, husband passed away from cancer   Social Drivers of Health   Financial  Resource Strain: Patient Declined (09/24/2023)   Overall Financial Resource Strain (CARDIA)    Difficulty of Paying Living Expenses: Patient declined  Food Insecurity: Patient Declined (09/24/2023)   Hunger Vital Sign    Worried About Running Out of Food in the Last Year: Patient declined    Ran Out of Food in the Last Year: Patient declined  Transportation Needs: Patient Declined (09/24/2023)   PRAPARE - Administrator, Civil Service (Medical): Patient declined    Lack of Transportation (Non-Medical): Patient declined  Physical Activity: Unknown (09/24/2023)   Exercise Vital Sign    Days of Exercise per Week: Patient declined    Minutes of Exercise per Session: Not on file  Stress: Patient Declined (09/24/2023)   [employer] of Occupational Health - Occupational Stress Questionnaire    Feeling of Stress : Patient declined  Social Connections: Unknown (09/24/2023)   Social Connection and Isolation Panel [NHANES]    Frequency of Communication with Friends and Family: Patient declined    Frequency of Social Gatherings with Friends and Family: Patient declined    Attends Religious Services: Patient declined    Database administrator or Organizations: Patient declined    Attends Engineer, structural: Not on file    Marital Status: Patient declined    MEDICATIONS:  Current Outpatient Medications  Medication Sig Dispense Refill   amLODipine  (NORVASC ) 10 MG tablet Take 1 tablet (10 mg total) by mouth daily. 90 tablet 0   atorvastatin  (LIPITOR) 10 MG tablet Take 1 tablet (10 mg total) by mouth at bedtime. 90 tablet 1   Blood Glucose Monitoring Suppl (ACCU-CHEK GUIDE ME) w/Device KIT Use to check blood sugar daily 1 kit 0   brexpiprazole  (REXULTI ) 2 MG TABS tablet Take 1 tablet (2 mg total) by mouth daily. 30 tablet 0   budeson-glycopyrrolate -formoterol  (BREZTRI  AEROSPHERE)  160-9-4.8 MCG/ACT AERO Inhale 2 puffs into the lungs in the morning and at bedtime.     busPIRone   (BUSPAR ) 15 MG tablet Take 1 tablet (15 mg total) by mouth 3 (three) times daily. 90 tablet 2   Cholecalciferol  (VITAMIN D3) 50 MCG (2000 UT) capsule Take 2,000 Units by mouth daily.     clonazePAM  (KLONOPIN ) 1 MG tablet Take 1 tablet (1 mg total) by mouth 2 (two) times daily. Further refill by PCP 60 tablet 0   Continuous Glucose Sensor (DEXCOM G6 SENSOR) MISC 1 each by Does not apply route See admin instructions. Change sensor every 10 days; E10.9 6 each 1   Continuous Glucose Sensor (DEXCOM G7 SENSOR) MISC Change every 10 days 3 each 3   Continuous Glucose Transmitter (DEXCOM G6 TRANSMITTER) MISC Use as instructed to check blood sugars, change every 3 months. 1 each 3   gabapentin  (NEURONTIN ) 300 MG capsule TAKE 1 CAPSULE BY MOUTH THREE TIMES A DAY AS NEEDED 270 capsule 1   glucagon  1 MG injection Inject 1 mg into the muscle once as needed. 1 each 12   insulin  aspart (NOVOLOG ) 100 UNIT/ML injection INJECT 40 UNITS DAILY PER INSULIN  PUMP (MAX DOSAGE) 30 mL 1   Insulin  Pen Needle (PEN NEEDLES) 32G X 5 MM MISC Use as instructed 100 each 0   Multiple Vitamin (MULTIVITAMIN WITH MINERALS) TABS tablet Take 1 tablet by mouth daily.     omeprazole  (PRILOSEC) 40 MG capsule Take 1 capsule (40 mg total) by mouth 2 (two) times daily. TAKE 1 CAPSULE TWICE A DAY. CALL [phone] TO SCHEDULE AN APPOINTMENT (NEEDS FOR FUTURE REFILLS) 60 capsule 0   ondansetron  (ZOFRAN -ODT) 4 MG disintegrating tablet Take 1 tablet (4 mg total) by mouth every 8 (eight) hours as needed for nausea or vomiting. 20 tablet 0   oxyCODONE -acetaminophen  (PERCOCET) 7.5-325 MG tablet Take 1 tablet by mouth 3 (three) times daily as needed.     rOPINIRole  (REQUIP ) 1 MG tablet TAKE 1 TABLET (1 MG TOTAL) BY MOUTH AT BEDTIME. TAKE 1 TABLET BY MOUTH AT BEDTIME 90 tablet 3   sertraline  (ZOLOFT ) 100 MG tablet Take 1 tablet (100 mg total) by mouth daily. 135 tablet 2   Tiotropium Bromide  Monohydrate (SPIRIVA  RESPIMAT) 2.5 MCG/ACT AERS Inhale 2 puffs  into the lungs daily at 2 PM. 4 g 2   VENTOLIN  HFA 108 (90 Base) MCG/ACT inhaler      Vitamin D , Ergocalciferol , (DRISDOL ) 1.25 MG (50000 UNIT) CAPS capsule Take 1 capsule (50,000 Units total) by mouth every 7 (seven) days. 12 capsule 1   No current facility-administered medications for this visit.    PHYSICAL EXAM: Vitals:   11/27/23 1116  BP: 118/60  Pulse: 99  Resp: 20  SpO2: 99%  Weight: 119 lb 12.8 oz (54.3 kg)  Height: 5\' 4"  (1.626 m)    Body mass index is 20.56 kg/m.  Wt Readings from Last 3 Encounters:  11/27/23 119 lb 12.8 oz (54.3 kg)  11/26/23 120 lb 2 oz (54.5 kg)  10/19/23 120 lb 4 oz (54.5 kg)    General: Well developed, well nourished female in no apparent distress.  HEENT: AT/[place], no external lesions.  Eyes: Conjunctiva clear and no icterus. Neck: Neck supple  Lungs: Respirations not labored Neurologic: Alert, oriented, normal speech Extremities / Skin: Dry.  Psychiatric: Does not appear depressed or anxious  Diabetic Foot Exam - Simple   No data filed     LABS Reviewed Lab Results  Component Value Date  HGBA1C 9.6 (A) 11/27/2023   HGBA1C 11.0 (H) 09/13/2023   HGBA1C 9.4 (A) 01/09/2023   No results found for: "FRUCTOSAMINE" Lab Results  Component Value Date   CHOL 220 (H) 05/12/2010   HDL 49.90 05/12/2010   LDLDIRECT 142.7 05/12/2010   TRIG 200.0 (H) 05/12/2010   CHOLHDL 4 05/12/2010   Lab Results  Component Value Date   MICRALBCREAT 10.5 01/08/2023   MICRALBCREAT 7.1 12/31/2015   Lab Results  Component Value Date   CREATININE 0.79 09/30/2023   Lab Results  Component Value Date   GFR 94.81 09/13/2023    ASSESSMENT / PLAN  1. Uncontrolled type 1 diabetes mellitus with hyperglycemia (HCC)     Diabetes Mellitus type 1, complicated by not clear about complication at this time, probably diabetic retinopathy. - Diabetic status / severity: Uncontrolled.  Lab Results  Component Value Date   HGBA1C 9.6 (A) 11/27/2023    -  Hemoglobin A1c goal <6.5%   Patient is mainly having hyperglycemia with uncontrolled blood sugar.  He is mainly due to inadequate meal bolus and not enough basal insulin .  She is not on control IQ more with Dexcom G7.    Discussed about chronic diabetic complications related to uncontrolled diabetes mellitus.  Patient is hesitant to use higher dose of meal boluses due to fear of hypoglycemia.  Discussed about compliance with insulin .  Patient is advised to use higher dose of meal bolus up to 4 units at a time for larger meal.  Discussed to adjust meal boluses 0.5 to 4 units based on meal size and physical activity and blood sugar around that time.  Patient agreed to change basal rate slightly.  Patient is not able to update application to be compatible with Dexcom G7 with her tandem pump to function control IQ mode.  Talked and discussed with diabetic educator and patient will set up follow-up appointment with diabetic educator to upgrade application for the tandem pump to be compatible with Dexcom G7.  Patient reports she does not have time for upgrading tandem application today as she has another appointment at [facility].  - Medications:  Insulin  pump setting changed as follows: Insulin  Pump setting:  Basal MN- 0.1u/hour, changed to 0.12  Bolus CHO Ratio (1unit:CHO) MN- 1:1, she has been mainly correcting manually with insulin  dose of 0.5 to 2 units at a time, without carb counting.  Use up to 4 units for a meal for large meal.  Correction/Sensitivity: MN- 1:500  Target: 150  Active insulin  time: 5 hours  - Home glucose testing: continue CGM and check blood glucose as needed.   - Discussed/ Gave Hypoglycemia treatment plan.  # Consult : not required at this time.   # Annual urine for microalbuminuria/ creatinine ratio, no microalbuminuria currently. Last  Lab Results  Component Value Date   MICRALBCREAT 10.5 01/08/2023    # Foot check nightly.  # Annual dilated  diabetic eye exams.  Regular follow-up with ophthalmology.  Patient has diabetic retinopathy.  - Diet: Make healthy diabetic food choices - Life style / activity / exercise: Discussed.  2. Blood pressure  -  BP Readings from Last 1 Encounters:  11/27/23 118/60    - Control is in target.  - No change in current plans.  3. Lipid status / Hyperlipidemia - Last No results found for: "LDLCALC" - Continue atorvastatin  10 mg daily managed by primary care provider.  Diagnoses and all orders for this visit:  Uncontrolled type 1 diabetes mellitus with hyperglycemia (HCC) -  POCT glycosylated hemoglobin (Hb A1C)     DISPOSITION Follow up in clinic in 6 weeks suggested.   All questions answered and patient verbalized understanding of the plan.  [place] Kori Colin, MD [facility] End[facility] [address] [place], [address] [place], [place] [address] Phone # [phone]  At least part of this note was generated using voice recognition software. Inadvertent word errors may have occurred, which were not recognized during the proofreading process.

## 2023-12-02 ENCOUNTER — Encounter: Payer: Self-pay | Admitting: Family Medicine

## 2023-12-02 NOTE — Assessment & Plan Note (Signed)
 Significant anxiety exacerbated by changes in psychiatric care. Klonopin  weaning increased anxiety. Patient is willing to wean off Benzos, will require slow weaning process to avoid increasing anxiety and panic attack. - Restart Klonopin  to 1 mg twice daily.  - Plan to start Depakote at next visit - Start Rexulti  2 mg as recommended by psychiatry.  - Continue Buspar  15 mg TID - Continue Zoloft  100 mg daily - Follow up in one week to reassess.

## 2023-12-03 ENCOUNTER — Other Ambulatory Visit: Payer: Self-pay | Admitting: Adult Health

## 2023-12-03 DIAGNOSIS — I10 Essential (primary) hypertension: Secondary | ICD-10-CM

## 2023-12-04 ENCOUNTER — Telehealth: Payer: Self-pay

## 2023-12-04 NOTE — Telephone Encounter (Signed)
 Copied from CRM [id number]. Topic: General - Other >> Dec 04, 2023  2:38 PM Albertha Alosa wrote: Reason for CRM: Adelene Adolf from [facility] urology called followup on paperwork, stating she will be sending it again to be signed and returned

## 2023-12-04 NOTE — Telephone Encounter (Signed)
 Called pt and she stated that she does not need anything from [employer] and she stated that she has told them when they called she she not need their services.

## 2023-12-20 ENCOUNTER — Other Ambulatory Visit: Payer: Self-pay

## 2023-12-20 ENCOUNTER — Encounter: Payer: Self-pay | Admitting: Endocrinology

## 2023-12-20 ENCOUNTER — Telehealth: Payer: Self-pay | Admitting: Family Medicine

## 2023-12-20 DIAGNOSIS — E1065 Type 1 diabetes mellitus with hyperglycemia: Secondary | ICD-10-CM

## 2023-12-20 MED ORDER — DEXCOM G7 SENSOR MISC
Status: AC
Start: 2023-12-20 — End: ?

## 2023-12-20 NOTE — Telephone Encounter (Signed)
 Called patient to see if she wanted to come into office to see another provider tomorrow. Patient stated she did not want to see another provider. She wanted to talk to [name] about getting another provider. At time of call [name] did not have any openings this afternoon or tomorrow. Patient is already scheduled to come into office on 12/24/2023, she states she can still make that appointment, she wanted to just be seen sooner. She thinks she will run out of her medication before Monday.   Copied from CRM [id number]. Topic: Appointments - Scheduling Inquiry for Clinic >> Dec 20, 2023 12:17 PM [name] wrote: Reason for CRM: Patient is requesting to see her provider earlier than appointment 12/24/23. Stated that her provider usually can work her in to be seen early.

## 2023-12-20 NOTE — Telephone Encounter (Signed)
 Called and spoke to pt and she will have enough medication to get her to her appointment on 6/9

## 2023-12-24 ENCOUNTER — Other Ambulatory Visit: Payer: Self-pay | Admitting: Family Medicine

## 2023-12-24 ENCOUNTER — Encounter: Payer: Self-pay | Admitting: Family Medicine

## 2023-12-24 ENCOUNTER — Ambulatory Visit (INDEPENDENT_AMBULATORY_CARE_PROVIDER_SITE_OTHER): Payer: MEDICAID | Admitting: Family Medicine

## 2023-12-24 VITALS — BP 142/70 | HR 91 | Temp 98.7°F | Resp 20 | Ht 64.0 in | Wt 118.5 lb

## 2023-12-24 DIAGNOSIS — F410 Panic disorder [episodic paroxysmal anxiety] without agoraphobia: Secondary | ICD-10-CM | POA: Diagnosis not present

## 2023-12-24 DIAGNOSIS — I152 Hypertension secondary to endocrine disorders: Secondary | ICD-10-CM | POA: Diagnosis not present

## 2023-12-24 DIAGNOSIS — J4489 Other specified chronic obstructive pulmonary disease: Secondary | ICD-10-CM

## 2023-12-24 DIAGNOSIS — E108 Type 1 diabetes mellitus with unspecified complications: Secondary | ICD-10-CM | POA: Diagnosis not present

## 2023-12-24 DIAGNOSIS — F39 Unspecified mood [affective] disorder: Secondary | ICD-10-CM

## 2023-12-24 DIAGNOSIS — Z79899 Other long term (current) drug therapy: Secondary | ICD-10-CM

## 2023-12-24 LAB — EXTRA SPECIMEN

## 2023-12-24 MED ORDER — BREXPIPRAZOLE 2 MG PO TABS
2.0000 mg | ORAL_TABLET | Freq: Every day | ORAL | 1 refills | Status: DC
Start: 2023-12-24 — End: 2024-04-11

## 2023-12-24 MED ORDER — CLONAZEPAM 1 MG PO TABS: 1.0000 mg | ORAL_TABLET | Freq: Two times a day (BID) | ORAL | 3 refills | Status: DC

## 2023-12-24 MED ORDER — CLONAZEPAM 1 MG PO TABS
1.0000 mg | ORAL_TABLET | Freq: Two times a day (BID) | ORAL | 1 refills | Status: DC
Start: 2023-12-24 — End: 2023-12-24

## 2023-12-24 NOTE — Assessment & Plan Note (Signed)
 Blood sugar levels improved, recent measurement 9.6. - Continue insulin  pump - Follow up with Endocrinology as scheduled

## 2023-12-24 NOTE — Progress Notes (Addendum)
 SUBJECTIVE:   Chief Complaint  Patient presents with   Medication Refill   HPI Presents for follow up chronic disease management  Discussed the use of AI scribe software for clinical note transcription with the patient, who gave verbal consent to proceed.  History of Present Illness [name] is a 48 year old female who presents for medication management and follow-up for anxiety and depression.  She is currently taking Klonopin  at a dose of 1 mg twice a day but has been attempting to reduce her intake by half a tablet occasionally. However, she has not been consistent due to adverse effects such as feeling 'completely messed up' for a week. She reports difficulty in maintaining a consistent reduction due to these effects.  She has recently increased her Rexulti  to 2 mg, which she feels has provided some improvement. She also takes Buspar  at 15 mg three times a day, which initially caused shakiness but has since been tolerated. Zoloft  is taken at 100 mg daily, and she has been on it for a long time. She is concerned about her ability to manage her anxiety without Klonopin , fearing she might 'go crazier'.  Significant life stressors include the passing of her husband over a year and a half ago and her father's severe car accident two years ago. These events have contributed to her anxiety and depression. She is in the process of starting therapy for the first time, having found a therapist who accepts Medicaid.  She notes a recent improvement in her blood sugar levels, which have decreased to 9.6.  She has not yet undergone a colonoscopy or mammogram, though these have been ordered by another doctor. She is uncertain about the status of her urine protein check.     PERTINENT PMH / PSH: As above  OBJECTIVE:  BP (!) 142/70   Pulse 91   Temp 98.7 F (37.1 C)   Resp 20   Ht 5\' 4"  (1.626 m)   Wt 118 lb 8 oz (53.8 kg)   SpO2 98%   BMI 20.34 kg/m    Physical Exam Vitals  reviewed.  Constitutional:      General: She is not in acute distress.    Appearance: Normal appearance. She is normal weight. She is not ill-appearing, toxic-appearing or diaphoretic.  Eyes:     General:        Right eye: No discharge.        Left eye: No discharge.     Conjunctiva/sclera: Conjunctivae normal.  Cardiovascular:     Rate and Rhythm: Normal rate.  Pulmonary:     Effort: Pulmonary effort is normal.  Abdominal:     General: Bowel sounds are normal.  Musculoskeletal:        General: Normal range of motion.  Skin:    General: Skin is warm and dry.  Neurological:     General: No focal deficit present.     Mental Status: She is alert and oriented to person, place, and time. Mental status is at baseline.  Psychiatric:        Mood and Affect: Mood normal.        Behavior: Behavior normal.        Thought Content: Thought content normal.        Judgment: Judgment normal.           12/24/2023   12:57 PM 11/26/2023    2:13 PM 10/19/2023    3:32 PM 09/24/2023    2:00 PM 09/20/2023  11:53 AM  Depression screen PHQ 2/9  Decreased Interest 2 2 3 1  0  Down, Depressed, Hopeless 0 0 3 1 0  PHQ - 2 Score 2 2 6 2  0  Altered sleeping 1 0 1 2 0  Tired, decreased energy 1 0 2 1 0  Change in appetite 0 1 1 2  0  Feeling bad or failure about yourself  1 1 2  0 0  Trouble concentrating 0 0 0 0 0  Moving slowly or fidgety/restless 0 1 0 0 0  Suicidal thoughts 0 0 0 0 0  PHQ-9 Score 5 5 12 7  0  Difficult doing work/chores Somewhat difficult Very difficult Somewhat difficult Somewhat difficult Not difficult at all      12/24/2023   12:57 PM 11/26/2023    2:13 PM 10/19/2023    3:32 PM 09/24/2023    2:01 PM  GAD 7 : Generalized Anxiety Score  Nervous, Anxious, on Edge 2 3 0 2  Control/stop worrying 2 3 1 3   Worry too much - different things 2 3 1 3   Trouble relaxing 1 3 1 2   Restless 0 2 0 0  Easily annoyed or irritable 0 2 0 1  Afraid - awful might happen 1 3 1 1   Total GAD 7  Score 8 19 4 12   Anxiety Difficulty Somewhat difficult Extremely difficult Somewhat difficult Somewhat difficult    ASSESSMENT/PLAN:  Controlled substance agreement signed -     Drug Screen 10 W/Conf, Serum -     clonazePAM ; Take 1 tablet (1 mg total) by mouth 2 (two) times daily. Further refill by PCP  Dispense: 60 tablet; Refill: 3  Panic disorder without agoraphobia -     Brexpiprazole ; Take 1 tablet (2 mg total) by mouth daily.  Dispense: 90 tablet; Refill: 1 -     AMB Referral VBCI Care Management  Mood disorder [facility]) Assessment & Plan: Significant anxiety exacerbated by recent stressors. Klonopin  tapering needed due to withdrawal symptoms. Therapy initiated. Consideration for Zoloft  increase or switch to Celexa. Rexulti  increased to 2 mg. Previous MDQ screening positive.  - Continue Klonopin  1 mg twice daily as needed. - Implement gradual tapering of Klonopin  with a consistent schedule. - Continue Rexulti  2 mg daily. - Continue Buspar  15 mg three times daily. - Consider increasing Zoloft  to 200 mg daily or switching to Celexa. - Encourage continuation of therapy for anxiety management. - Referral sent to pharmacy for medication management.  - Serum tox screen today.  Urine failed temp screen - Substance contract signed.    Orders: -     Brexpiprazole ; Take 1 tablet (2 mg total) by mouth daily.  Dispense: 90 tablet; Refill: 1 -     AMB Referral VBCI Care Management -     clonazePAM ; Take 1 tablet (1 mg total) by mouth 2 (two) times daily. Further refill by PCP  Dispense: 60 tablet; Refill: 3  Hypertension associated with diabetes (HCC) Assessment & Plan: Elevated BP likely due to increasing anxiety - Recheck blood pressure has decreased - Continue amlodipine  10 mg daily - Recommend addition of ARB - Follows with Endocrinology and Cardiology   Type 1 diabetes mellitus with complications [facility]) Assessment & Plan: Blood sugar levels improved, recent measurement 9.6. -  Continue insulin  pump - Follow up with Endocrinology as scheduled    PDMP reviewed  Return in about 3 months (around 03/25/2024) for PCP.  Valli Gaw, MD

## 2023-12-24 NOTE — Patient Instructions (Addendum)
 It was a pleasure meeting you today. Thank you for allowing me to take part in your health care.  Our goals for today as we discussed include:  Follow up with the therapist  I have refilled the Klonipin and Breztri  Continue to work on finding a psychiatrist to manage medications  For Mental Health Concerns  [facility] [phone] Address: [address] [place], [place] [address] Hours: Open 24/7, No appointment required.    Referral sent to psychiatry to help with medication management until able to find psychiatry.  Continue to follow up with Endocrinology   Follow up in 3 months   This is a list of the screening recommended for you and due dates:  Health Maintenance  Topic Date Due   DTaP/Tdap/Td vaccine (2 - Tdap) 07/18/2009   Colon Cancer Screening  04/14/2021   Complete foot exam   01/06/2022   COVID-19 Vaccine (1 - 2024-25 season) Never done   Eye exam for diabetics  09/19/2023   Yearly kidney health urinalysis for diabetes  01/08/2024   Pneumococcal Vaccination (1 of 2 - PCV) 08/22/2024*   Flu Shot  02/15/2024   Pap with HPV screening  05/29/2024   Hemoglobin A1C  05/29/2024   Yearly kidney function blood test for diabetes  09/29/2024   Hepatitis C Screening  Completed   HIV Screening  Completed   HPV Vaccine  Aged Out   Meningitis B Vaccine  Aged Out  *Topic was postponed. The date shown is not the original due date.      If you have any questions or concerns, please do not hesitate to call the office at [phone].  I look forward to our next visit and until then take care and stay safe.  Regards,   Valli Gaw, MD   [facility]

## 2023-12-24 NOTE — Telephone Encounter (Unsigned)
 Copied from CRM [id number]. Topic: Clinical - Medication Question >> Dec 24, 2023  3:00 PM Leah C wrote: Reason for CRM: Patient called in asking to speak with a nurse or provider. She just had an appointment and wanted to know if the prescriptions clonazePAM  (KLONOPIN ) 1 MG tablet and budeson-glycopyrrolate -formoterol  (BREZTRI  AEROSPHERE) 160-9-4.8 MCG/ACT AERO were called in yet.  I advised, if I read correctly, the klonopin  was submitted for a refill but I didn't see anything yet for the breztri  prescription. Patient asked if she would be started on that today. Patient contact is [phone].

## 2023-12-24 NOTE — Assessment & Plan Note (Addendum)
 Significant anxiety exacerbated by recent stressors. Klonopin  tapering needed due to withdrawal symptoms. Therapy initiated. Consideration for Zoloft  increase or switch to Celexa. Rexulti  increased to 2 mg. Previous MDQ screening positive.  - Continue Klonopin  1 mg twice daily as needed. - Implement gradual tapering of Klonopin  with a consistent schedule. - Continue Rexulti  2 mg daily. - Continue Buspar  15 mg three times daily. - Consider increasing Zoloft  to 200 mg daily or switching to Celexa. - Encourage continuation of therapy for anxiety management. - Referral sent to pharmacy for medication management.  - Serum tox screen today.  Urine failed temp screen - Substance contract signed.

## 2023-12-24 NOTE — Addendum Note (Signed)
 Addended by: Tyrice Hewitt M on: 12/24/2023 04:59 PM   Modules accepted: Orders

## 2023-12-24 NOTE — Assessment & Plan Note (Signed)
 Elevated BP likely due to increasing anxiety - Recheck blood pressure has decreased - Continue amlodipine  10 mg daily - Recommend addition of ARB - Follows with Endocrinology and Cardiology

## 2023-12-25 ENCOUNTER — Ambulatory Visit: Payer: Self-pay | Admitting: Professional Counselor

## 2023-12-26 ENCOUNTER — Other Ambulatory Visit: Payer: Self-pay | Admitting: Family Medicine

## 2023-12-26 DIAGNOSIS — J4489 Other specified chronic obstructive pulmonary disease: Secondary | ICD-10-CM

## 2023-12-26 MED ORDER — BREZTRI AEROSPHERE 160-9-4.8 MCG/ACT IN AERO
2.0000 | INHALATION_SPRAY | Freq: Two times a day (BID) | RESPIRATORY_TRACT | 11 refills | Status: AC
Start: 2023-12-26 — End: ?

## 2023-12-27 ENCOUNTER — Other Ambulatory Visit (HOSPITAL_COMMUNITY): Payer: Self-pay

## 2023-12-27 ENCOUNTER — Other Ambulatory Visit: Payer: Self-pay | Admitting: Family Medicine

## 2023-12-27 ENCOUNTER — Telehealth: Payer: Self-pay

## 2023-12-27 DIAGNOSIS — J4489 Other specified chronic obstructive pulmonary disease: Secondary | ICD-10-CM

## 2023-12-27 NOTE — Progress Notes (Signed)
 Complex Care Management Note  Care Guide Note 12/27/2023 Name: JARIANA SHUMARD MRN: [id number] DOB: September 13, 1975  SOTIRIA KEAST is a 48 y.o. year old female who sees Valli Gaw, MD for primary care. I reached out to Lacie Piano by phone today to offer complex care management services.  Ms. Meaney was given information about Complex Care Management services today including:   The Complex Care Management services include support from the care team which includes your Nurse Care Manager, Clinical Social Worker, or Pharmacist.  The Complex Care Management team is here to help remove barriers to the health concerns and goals most important to you. Complex Care Management services are voluntary, and the patient may decline or stop services at any time by request to their care team member.   Complex Care Management Consent Status: Patient wishes to consider information provided and/or speak with a member of the care team before deciding to participate in complex care management services.   Follow up plan:  The care guide will reach out to the patient again over the next 7 days.  Encounter Outcome:  Patient Request to Call Back  [facility]  [facility], [facility] Health Care Management Assistant Direct Dial: [phone]  Fax: [phone]

## 2023-12-27 NOTE — Telephone Encounter (Signed)
 Pharmacy Patient Advocate Encounter  Received notification from [facility] that Prior Authorization for Breztri  [employer] has been DENIED.  No reason given; No denial letter received via Fax or CMM. It has been requested and will be uploaded to the media tab once received.   PA #/Case ID/Reference #: [id number]

## 2023-12-27 NOTE — Telephone Encounter (Signed)
 Pharmacy Patient Advocate Encounter   Received notification from Patient Pharmacy that prior authorization for Breztri  Aerosphere is required/requested.   Insurance verification completed.   The patient is insured through [employer] [facility] .   Per test claim: PA required; PA submitted to above mentioned insurance via CoverMyMeds Key/confirmation #/EOC [id number] Status is pending

## 2023-12-28 ENCOUNTER — Other Ambulatory Visit: Payer: Self-pay | Admitting: Family Medicine

## 2023-12-28 ENCOUNTER — Other Ambulatory Visit (HOSPITAL_COMMUNITY): Payer: Self-pay

## 2023-12-28 DIAGNOSIS — J4489 Other specified chronic obstructive pulmonary disease: Secondary | ICD-10-CM

## 2023-12-28 MED ORDER — BUDESONIDE-FORMOTEROL FUMARATE 160-4.5 MCG/ACT IN AERO: 2.0000 | INHALATION_SPRAY | Freq: Two times a day (BID) | RESPIRATORY_TRACT | 11 refills | Status: DC

## 2023-12-28 NOTE — Addendum Note (Signed)
 Addended by: Jennings Corado M on: 12/28/2023 03:55 PM   Modules accepted: Orders

## 2023-12-31 ENCOUNTER — Encounter: Payer: Self-pay | Admitting: Endocrinology

## 2024-01-02 ENCOUNTER — Telehealth: Payer: Self-pay

## 2024-01-02 ENCOUNTER — Telehealth: Payer: Self-pay | Admitting: Neurosurgery

## 2024-01-02 NOTE — Telephone Encounter (Unsigned)
 Copied from CRM [id number]. Topic: Clinical - Medical Advice >> Jan 02, 2024  4:29 PM Earnestine Goes B wrote: Reason for CRM: pt called to speak with the provider regarding the phar amist, states she does not under stand why she  needs to make an appt with the pharmacist. Please call pt back at [phone]

## 2024-01-02 NOTE — Telephone Encounter (Signed)
 Please thank her for keeping us  updated on her labs. Her HgbA1c would need to be < 7.5 prior to considering any surgery AND she would need to quit smoking.

## 2024-01-02 NOTE — Telephone Encounter (Signed)
 She is calling that her A1c this month was 9.6. About a month and half ago is was 12.9. she has her labs done at [facility] [phone]. She said to give this information to [place] and the nurse.

## 2024-01-02 NOTE — Progress Notes (Unsigned)
 Complex Care Management Note Care Guide Note  01/02/2024 Name: [name] MRN: [id number] DOB: 1976-04-21   Complex Care Management Outreach Attempts: A second unsuccessful outreach was attempted today to offer the patient with information about available complex care management services.  Follow Up Plan:  Additional outreach attempts will be made to offer the patient complex care management information and services.   Encounter Outcome:  Patient Request to Call Back  [facility]  [facility], [facility] Health Care Management Assistant Direct Dial: [phone]  Fax: [phone]

## 2024-01-04 NOTE — Telephone Encounter (Signed)
 Last read by Lacie Piano at 1:52PM on 01/04/2024.

## 2024-01-04 NOTE — Progress Notes (Signed)
 Complex Care Management Note  Care Guide Note 01/04/2024 Name: [name] MRN: [id number] DOB: 1975/12/31  [name] is a 48 y.o. year old female who sees Valli Gaw, MD for primary care. I reached out to Lacie Piano by phone today to offer complex care management services.  Ms. Glatfelter was given information about Complex Care Management services today including:   The Complex Care Management services include support from the care team which includes your Nurse Care Manager, Clinical Social Worker, or Pharmacist.  The Complex Care Management team is here to help remove barriers to the health concerns and goals most important to you. Complex Care Management services are voluntary, and the patient may decline or stop services at any time by request to their care team member.   Complex Care Management Consent Status: Patient agreed to services and verbal consent obtained.   Follow up plan:  Telephone appointment with complex care management team member scheduled for:  01/07/24 at 2:30 p.m.  Encounter Outcome:  Patient Scheduled  [facility]  [facility], [facility] Health Care Management Assistant Direct Dial: [phone]  Fax: [phone]

## 2024-01-07 NOTE — Progress Notes (Signed)
   01/14/2024 Name: [name] MRN: [id number] DOB: January 16, 1976  Chief Complaint  Patient presents with   Medication Monitoring    Anxiety   Anxiety    [name] is a 48 y.o. year old female who presented for a telephone visit.   They were referred to the pharmacist by their PCP for assistance in managing psych medications.  Referral reason: Having difficulty finding psychiatrist to take over medication management Have started to wean clonazepam  slowly  Subjective:  Care Team: Primary Care Provider: Hope Merle, MD  Medication Access/Adherence Patient reports affordability concerns with their medications: No  Patient reports access/transportation concerns to their pharmacy: No  Patient reports adherence concerns with their medications:  No    HPI: Patient without psych provider at this time, managed by PCP at present. Historically, patient declines therapy services. PCP has initiated process of prolonged taper off of clonazepam .   Goal established: Discontinuation over the next 3 months.   Current dose: 1 mg two times daily  Today patient reports doing okay. Feels depression still present though feels lately it is more situational, notes doing generally better than previous. Notes anxiety specifically has been worse s/p husband passing. Feels rixulti and buspar  have helped significantly. States she has Been on Zoloft  for several years. Feels her moods have been mostly stable though since she has been on Zoloft  for so long, is unsure if it is helping or not. Is possibly interested in trying something different in the future if concerns arise.    Today, she reports she has started tapering herself off of clonazepam . Currently on 1 mg twice daily, though recently has started using 1 mg AM/ 0.5 mg in the evening some days. (On average, ~3 days a week is using 0.5 mg in the evening). Reports increased anxiety in the mornings after using smaller evening dose though  resolved after morning dose is taken.   Lifestyle: Support from her children. Enjoys speaking with them - Hearing they are well helps her to feel calm  Feels daily goal setting has been helpful for her.   Assessment/Plan:  Praised her practice of goal setting. Discussed small step-wise goals each day/week are more likely to result in success. Goal to start using 0.5 mg evening dose 4 days per week instead of 3 days per week.   Clonazepam  taper: Weeks 1 to 4: Clonazepam  2mg /day to 1mg /day  Current goal: 3 ? 4-5 evening doses down to 0.5 mg  By end of 4 weeks: All evening doses to 0.5 mg.  Weeks 5 to 8: Clonazepam  1mg /day Weeks 9 to 12: Clonazepam  0.75mg /day to 0.5mg /day Weeks 13 to 15: Clonazepam  0.25mg /day   Follow Up Plan: Phone follow up with PharmD ~2 weeks   Future Appointments  Date Time Provider Department Center  01/15/2024  3:00 PM Dallie Vera GAILS, MD [facility] None  03/24/2024  1:00 PM Bair, Luke, MD [facility]

## 2024-01-08 ENCOUNTER — Other Ambulatory Visit (INDEPENDENT_AMBULATORY_CARE_PROVIDER_SITE_OTHER): Payer: MEDICAID | Admitting: Pharmacist

## 2024-01-08 DIAGNOSIS — F39 Unspecified mood [affective] disorder: Secondary | ICD-10-CM

## 2024-01-09 ENCOUNTER — Ambulatory Visit: Payer: Self-pay | Admitting: Family Medicine

## 2024-01-09 LAB — BENZODIAZEPINES,MS,WB/SP RFX
7-Aminoclonazepam: 13 ng/mL
Alprazolam: NEGATIVE ng/mL
Benzodiazepines Confirm: POSITIVE
Chlordiazepoxide: NEGATIVE
Clonazepam: 19.4 ng/mL
Desalkylflurazepam: NEGATIVE ng/mL
Desmethylchlordiazepoxide: NEGATIVE
Desmethyldiazepam: NEGATIVE ng/mL
Diazepam: NEGATIVE ng/mL
Flurazepam: NEGATIVE ng/mL
Lorazepam: NEGATIVE ng/mL
Midazolam: NEGATIVE ng/mL
Oxazepam: NEGATIVE ng/mL
Temazepam: NEGATIVE ng/mL
Triazolam: NEGATIVE ng/mL

## 2024-01-09 LAB — DRUG SCREEN 8 W/CONF, WB
Amphetamines, IA: NEGATIVE ng/mL
Barbiturates, IA: NEGATIVE ug/mL
Cocaine/Metabolite, IA: NEGATIVE ng/mL
Opiates, IA: NEGATIVE ng/mL
Phencyclidine, IA: NEGATIVE ng/mL
THC (Marijuana) Mtb, IA: NEGATIVE ng/mL

## 2024-01-09 LAB — OXYCODONES,MS,WB/SP RFX
Oxycocone: 12.4 ng/mL
Oxycodones Confirmation: POSITIVE
Oxymorphone: NEGATIVE ng/mL

## 2024-01-11 ENCOUNTER — Ambulatory Visit: Payer: Self-pay

## 2024-01-11 NOTE — Telephone Encounter (Signed)
 Reason for Disposition . Caller has already spoken with another triager and has no further questions.    Spoke with PAS, no further questions  Protocols used: No Contact or Duplicate Contact Call-A-AH

## 2024-01-11 NOTE — Telephone Encounter (Signed)
 Copied from CRM [id number]. Topic: Clinical - Lab/Test Results >> Jan 11, 2024 10:35 AM Drema MATSU wrote: Reason for CRM: Relayed test results.

## 2024-01-11 NOTE — Telephone Encounter (Addendum)
 3rd attempt reached patient, she states she has no further needs and all questions were answered during initial call. No triage needed at this time.   2nd attempt. Lvmtcb.  Note from specialist states lab results were provided. Called to see if patient has additional questions since crm sent to e2c2.

## 2024-01-14 NOTE — Patient Instructions (Addendum)
 Ms. NAIOMI MUSTO,   It was a pleasure to speak with you! As we discussed:?   Continue you medications as you have been taking them.  Clonazepam :  Current progress:  Great work on starting to  use 0.5 mg clonazepam  in the evening some nights. We estimated on average, you have been able to use half a tablet about 3 nights per week most recently.   Current Goals  Goal for this week: Goal to use a half tablet (0.5 mg) at least four nights this week instead of three.  Small steps are okay, and will be the best way to achieve and maintain progress. Keep up the good work! It does get easier, especially with these slower goals.   Goal for next week: Goal to use half tablet (0.5 mg) evening dose at least five nights out of the week.   I will plan to touch base (via phone call) in 2 weeks to see how things have been going.   Please reach out prior to your next scheduled appointment should you have any questions or concerns.  You may respond directly to this message, or leave me a voicemail at [phone] and I will get back to you shortly.   Thank you!   Future Appointments  Date Time Provider Department Center  01/15/2024  3:00 PM Dallie Vera GAILS, MD [facility] None  03/24/2024  1:00 PM Bair, Luke, MD [facility]    Manuelita FABIENE Kobs, PharmD Clinical Pharmacist [facility] [phone]

## 2024-01-15 ENCOUNTER — Encounter: Payer: MEDICAID | Admitting: Obstetrics and Gynecology

## 2024-01-16 ENCOUNTER — Other Ambulatory Visit: Payer: Self-pay

## 2024-01-16 ENCOUNTER — Telehealth: Payer: Self-pay

## 2024-01-16 DIAGNOSIS — E1065 Type 1 diabetes mellitus with hyperglycemia: Secondary | ICD-10-CM

## 2024-01-16 NOTE — Telephone Encounter (Signed)
 Documentation returned from [employer], refusing pump order until after labs were completed. MD made aware on the initial form that fasting blood glucose and c-peptide was required in order to complete submission. Labs ordered and note placed in entry comments for MD.

## 2024-01-21 ENCOUNTER — Encounter: Payer: Self-pay | Admitting: Endocrinology

## 2024-01-21 ENCOUNTER — Other Ambulatory Visit: Payer: Self-pay | Admitting: Endocrinology

## 2024-01-21 DIAGNOSIS — E1065 Type 1 diabetes mellitus with hyperglycemia: Secondary | ICD-10-CM

## 2024-01-21 NOTE — Telephone Encounter (Signed)
 Refill request complete

## 2024-01-22 ENCOUNTER — Other Ambulatory Visit: Payer: MEDICAID

## 2024-01-25 ENCOUNTER — Telehealth: Payer: Self-pay

## 2024-01-25 NOTE — Telephone Encounter (Signed)
 I left a voicemail for patient asking her to please call us  back to schedule a transfer of care appointment, as Dr. Glenys Ferrari is no longer with our practice.  E2C2 - please assist patient with scheduling a transfer of care appointment when she returns our call.

## 2024-01-28 ENCOUNTER — Other Ambulatory Visit (INDEPENDENT_AMBULATORY_CARE_PROVIDER_SITE_OTHER): Payer: MEDICAID | Admitting: Pharmacist

## 2024-01-28 DIAGNOSIS — F419 Anxiety disorder, unspecified: Secondary | ICD-10-CM

## 2024-01-28 NOTE — Progress Notes (Unsigned)
   01/29/2024 Name: [name] MRN: [id number] DOB: 10-12-1975  Chief Complaint  Patient presents with   Medication Taper   Medication Monitoring    [name] is a 48 y.o. year old female who presented for a telephone visit.   They were referred to the pharmacist by their PCP for assistance in managing benzodiazepine taper.  Referral reason: Having difficulty finding psychiatrist to take over medication management Have started to wean clonazepam  slowly. Goal to discontinue use over next 3 months   Subjective:  Care Team: Primary Care Provider: No primary care provider on file. [facility] TOC ? Bair (03/24/24)  Medication Access/Adherence Patient reports affordability concerns with their medications: No  Patient reports access/transportation concerns to their pharmacy: No  Patient reports adherence concerns with their medications:  No    HPI: Patient without psych provider at this time, managed by PCP at present. Historically, patient declines therapy services. PCP has initiated process of prolonged taper off of clonazepam .   Goal established: Discontinuation over the next 3 months (June-September).    Progress: 6/24: Reported dose, 1 mg AM/PM - about 3 nights/week has used 0.5 mg in evening. Goal for next visit (7/14) = most evening doses down to 0.5 mg (half tab)  Patient-reported Regimen 01/28/24: Current dose: 0.5 mg two-three times per day  Has been using 0.5 mg (1/2 tablet) AM/PM. On average, most days, uses 1/2 tablet around lunch per symptoms. Notes 1 or 2 days over the past couple weeks using only two 0.5 mg doses daily. When night dose is skipped, finds mornings are harder. Generally, reports her worst time of day for anxiety symptoms is first thing in the morning.   Reported symptoms:  Shaky, scared, starts feeling panic attack. 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   Today patient reports doing okay. Continues to note  depression still present though feels lately it is more situational, notes doing generally better than previous months. Spending time with her children has been the most helpful tool.    Lifestyle: Spending time with/speaking to children helps to calm ger. Also helps with depressive symptoms.  Feels daily goal setting has been helpful for her.   Assessment/Plan:  Praised her practice of goal setting. Discussed small step-wise goals each day/week are more likely to result in success. Has been doing very well with taper. Remains self-motivated  Goal for next visit: Down to 1 mg total daily dose (half tablet twice daily most if not all days of the week)  Initial plan for Clonazepam  taper:  Clonazepam  2mg /day to 1mg /day  Current goal: 3 ? 4-5 evening doses down to 0.5 mg  By end of 4 weeks: All evening doses to 0.5 mg.  Weeks 5 to 8: Clonazepam  1mg /day Weeks 9 to 12: Clonazepam  0.75mg /day to 0.5mg /day Weeks 13 to 15: Clonazepam  0.25mg /day    Follow Up Plan: Phone follow up with PharmD 2-4 weeks   Future Appointments  Date Time Provider Department Center  03/24/2024  1:00 PM Bair, Luke, MD [id number]

## 2024-02-06 ENCOUNTER — Other Ambulatory Visit: Payer: Self-pay | Admitting: Internal Medicine

## 2024-02-06 DIAGNOSIS — I10 Essential (primary) hypertension: Secondary | ICD-10-CM

## 2024-03-10 ENCOUNTER — Telehealth: Payer: Self-pay | Admitting: Internal Medicine

## 2024-03-10 DIAGNOSIS — I10 Essential (primary) hypertension: Secondary | ICD-10-CM

## 2024-03-10 MED ORDER — AMLODIPINE BESYLATE 10 MG PO TABS: 10.0000 mg | ORAL_TABLET | Freq: Every day | ORAL | 0 refills | Status: DC

## 2024-03-10 NOTE — Telephone Encounter (Signed)
 RX sent in

## 2024-03-10 NOTE — Telephone Encounter (Signed)
*  STAT* If patient is at the pharmacy, call can be transferred to refill team.   1. Which medications need to be refilled? (please list name of each medication and dose if known) amLODipine  (NORVASC ) 10 MG tablet    2. Would you like to learn more about the convenience, safety, & potential cost savings by using the [facility]?    3. Are you open to using the [facility] (Type [facility]. ).   4. Which pharmacy/location (including street and city if local pharmacy) is medication to be sent to?  [id number] - [place], [place] - [address]     5. Do they need a 30 day or 90 day supply? 90 day

## 2024-03-11 ENCOUNTER — Other Ambulatory Visit: Payer: Self-pay | Admitting: Internal Medicine

## 2024-03-11 DIAGNOSIS — I10 Essential (primary) hypertension: Secondary | ICD-10-CM

## 2024-03-11 MED ORDER — AMLODIPINE BESYLATE 10 MG PO TABS: 10.0000 mg | ORAL_TABLET | Freq: Every day | ORAL | 0 refills | Status: DC

## 2024-03-24 ENCOUNTER — Ambulatory Visit: Payer: MEDICAID

## 2024-03-24 DIAGNOSIS — F410 Panic disorder [episodic paroxysmal anxiety] without agoraphobia: Secondary | ICD-10-CM

## 2024-03-24 DIAGNOSIS — I10 Essential (primary) hypertension: Secondary | ICD-10-CM

## 2024-03-24 DIAGNOSIS — F39 Unspecified mood [affective] disorder: Secondary | ICD-10-CM

## 2024-03-24 DIAGNOSIS — E7800 Pure hypercholesterolemia, unspecified: Secondary | ICD-10-CM

## 2024-03-24 DIAGNOSIS — Z79899 Other long term (current) drug therapy: Secondary | ICD-10-CM

## 2024-03-24 DIAGNOSIS — E1142 Type 2 diabetes mellitus with diabetic polyneuropathy: Secondary | ICD-10-CM

## 2024-03-24 DIAGNOSIS — J4489 Other specified chronic obstructive pulmonary disease: Secondary | ICD-10-CM

## 2024-03-24 DIAGNOSIS — G2581 Restless legs syndrome: Secondary | ICD-10-CM

## 2024-04-11 ENCOUNTER — Ambulatory Visit: Payer: Self-pay

## 2024-04-11 ENCOUNTER — Telehealth: Payer: Self-pay

## 2024-04-11 DIAGNOSIS — F410 Panic disorder [episodic paroxysmal anxiety] without agoraphobia: Secondary | ICD-10-CM

## 2024-04-11 DIAGNOSIS — F39 Unspecified mood [affective] disorder: Secondary | ICD-10-CM

## 2024-04-11 MED ORDER — BREXPIPRAZOLE 2 MG PO TABS: 2.0000 mg | ORAL_TABLET | Freq: Every day | ORAL | 0 refills | Status: DC

## 2024-04-11 NOTE — Telephone Encounter (Signed)
 Ariel, Triage Nurse, called to state patient needs to be seen for anxiety medication refill and does not have a TOC scheduled with us .  Ariel states patient is planning to stay with our practice and she has been out of the medication (brexpiprazole  (REXULTI ) 2 MG TABS tablet)  for three days.  I scheduled a TOC appointment for patient with Dr. Luke Shade (05/12/2024) and scheduled a medication refill appointment for patient with Dr. Nischal Narendra on 04/16/2024.  Patient would like to know if we can give her enough medication to get her through to her appointment with Dr. Narendra.  After speaking with Sharene Silvan, CMA, I'm sending message to our Doc of the Day - Dr. Allena Hamilton.  I let patient know that we will let her know either way.  Prescription Request  04/11/2024  LOV: Visit date not found  What is the name of the medication or equipment?  brexpiprazole  (REXULTI ) 2 MG TABS tablet    Have you contacted your pharmacy to request a refill? Yes   Which pharmacy would you like this sent to?  [id number] [place], [place] - [address] [place] [address] Phone: [phone] Fax: [phone]    Patient notified that their request is being sent to the clinical staff for review and that they should receive a response within 2 business days.   Please advise at Mobile [phone] (mobile)  Please see note from Ariel (triage nurse) with details.

## 2024-04-11 NOTE — Telephone Encounter (Signed)
 Spoke with pt. Pt notified that she needs to be seen fror further refills. Appts made by front desk staff. 1 week of meds sent no refills

## 2024-04-11 NOTE — Telephone Encounter (Signed)
 FYI Only or Action Required?: FYI only for provider.  Patient was last seen in primary care on 12/24/2023 by Hope Merle, MD.  Called Nurse Triage reporting Depression.  Symptoms began today.  Interventions attempted: Other: Out of medication.  Symptoms are: stable.  Triage Disposition: Home Care  Patient/caregiver understands and will follow disposition?: Yes Reason for Disposition  Depression medicines, questions about  Answer Assessment - Initial Assessment Questions Patient stated she [facility], [facility] and was [facility]. Patient went back to pharmacy for the rest and they stated she needed a new prescription. Med refill visit 9/8, no show. No TOC scheduled. Called [facility], spoke with Suzen to assist with correct scheduling for patient. Latrisha assisted in scheduling patient TOC visit, and med refill visit. Stated the office is checking into being able to send enough medication to get her through to Wednesday.      1. CONCERN: What happened that made you call today?     Out of Rexulti  2MG   2. DEPRESSION SYMPTOM SCREENING: How are you feeling overall? (e.g., decreased energy, increased sleeping or difficulty sleeping, difficulty concentrating, feelings of sadness, guilt, hopelessness, or worthlessness)     Starting to get depressed and anxious, not bad but notices a difference  3. SUPPORT: Who is with you now? Who do you live with? Do you have family or friends who you can talk to?      Family and friends  4. THERAPIST: Do you have a counselor or therapist? If Yes, ask: What is their name?     Therapist, can't remember name. Last seen a few weeks ago  Protocols used: Depression-A-AH  Copied from CRM [id number]. Topic: Clinical - Red Word Triage >> Apr 11, 2024  3:01 PM Martinique E wrote: Reason for Triage: Patient has been without her brexpiprazole  (REXULTI ) 2 MG TABS tablet for 3 days which is leading  to worsening depression and anxiety.

## 2024-04-16 ENCOUNTER — Ambulatory Visit: Payer: MEDICAID | Admitting: Internal Medicine

## 2024-04-23 ENCOUNTER — Ambulatory Visit: Payer: MEDICAID | Admitting: Internal Medicine

## 2024-04-23 ENCOUNTER — Encounter: Payer: Self-pay | Admitting: Internal Medicine

## 2024-04-23 VITALS — BP 142/84 | HR 75 | Temp 97.5°F | Ht 64.0 in | Wt 118.2 lb

## 2024-04-23 DIAGNOSIS — I152 Hypertension secondary to endocrine disorders: Secondary | ICD-10-CM | POA: Diagnosis not present

## 2024-04-23 DIAGNOSIS — I10 Essential (primary) hypertension: Secondary | ICD-10-CM

## 2024-04-23 DIAGNOSIS — Z7985 Long-term (current) use of injectable non-insulin antidiabetic drugs: Secondary | ICD-10-CM

## 2024-04-23 DIAGNOSIS — E7800 Pure hypercholesterolemia, unspecified: Secondary | ICD-10-CM

## 2024-04-23 DIAGNOSIS — E785 Hyperlipidemia, unspecified: Secondary | ICD-10-CM

## 2024-04-23 DIAGNOSIS — E1159 Type 2 diabetes mellitus with other circulatory complications: Secondary | ICD-10-CM | POA: Diagnosis not present

## 2024-04-23 DIAGNOSIS — E108 Type 1 diabetes mellitus with unspecified complications: Secondary | ICD-10-CM

## 2024-04-23 DIAGNOSIS — E871 Hypo-osmolality and hyponatremia: Secondary | ICD-10-CM

## 2024-04-23 DIAGNOSIS — E1142 Type 2 diabetes mellitus with diabetic polyneuropathy: Secondary | ICD-10-CM

## 2024-04-23 DIAGNOSIS — Z79899 Other long term (current) drug therapy: Secondary | ICD-10-CM

## 2024-04-23 DIAGNOSIS — F39 Unspecified mood [affective] disorder: Secondary | ICD-10-CM

## 2024-04-23 LAB — BASIC METABOLIC PANEL WITH GFR
BUN: 6 mg/dL (ref 6–23)
CO2: 26 meq/L (ref 19–32)
Calcium: 9.3 mg/dL (ref 8.4–10.5)
Chloride: 90 meq/L — ABNORMAL LOW (ref 96–112)
Creatinine, Ser: 0.67 mg/dL (ref 0.40–1.20)
GFR: 103.64 mL/min (ref >60.00)
Glucose, Bld: 173 mg/dL — ABNORMAL HIGH (ref 70–99)
Potassium: 4.5 meq/L (ref 3.5–5.1)
Sodium: 124 meq/L — ABNORMAL LOW (ref 135–145)

## 2024-04-23 MED ORDER — GABAPENTIN 300 MG PO CAPS: 300.0000 mg | ORAL_CAPSULE | Freq: Three times a day (TID) | ORAL | 1 refills | Status: AC

## 2024-04-23 MED ORDER — ATORVASTATIN CALCIUM 10 MG PO TABS: 10.0000 mg | ORAL_TABLET | Freq: Every day | ORAL | 1 refills | Status: DC

## 2024-04-23 MED ORDER — CLONAZEPAM 1 MG PO TABS: 1.0000 mg | ORAL_TABLET | Freq: Every day | ORAL | 0 refills | Status: DC

## 2024-04-23 MED ORDER — AMLODIPINE BESYLATE 10 MG PO TABS: 10.0000 mg | ORAL_TABLET | Freq: Every day | ORAL | 0 refills | Status: DC

## 2024-04-23 NOTE — Assessment & Plan Note (Signed)
-   This problem is chronic and stable -Patient noted to have mild elevated blood pressure today at 142/84 -She had run out of her amlodipine  approximately 1 week ago and restarted taking it this morning -I suspect her high blood pressure is likely secondary to running out of her medication -Amlodipine  refill called in for the patient as well -No further workup at this time -Patient has a follow-up appointment with her PCP at the end of this month and we will recheck her blood pressure at that time

## 2024-04-23 NOTE — Assessment & Plan Note (Signed)
-   Patient has a history of chronic hyponatremia likely secondary to her psychiatric medications -Her last sodium was 120 back in March -We will recheck her BMP today -No further workup at this time

## 2024-04-23 NOTE — Assessment & Plan Note (Signed)
-   Patient has a history of significant anxiety and is on a Klonopin  taper at home -She is currently down from 2 mg of Klonopin  a day to 0.5 to 1 mg daily -She is also on Zoloft , Rexulti  and BuSpar  for her anxiety -She needs a refill of her Klonopin  till she can meet her new PCP (at the end of the month) -Will refill her Klonopin  (30 pills) today -Patient does follow-up with therapist as an outpatient but may need referral to psychiatry given that she is unable to taper off her Klonopin  -Current GAD-7 and PHQ-9 scores are 0 -No further workup at this time

## 2024-04-23 NOTE — Patient Instructions (Signed)
  VISIT SUMMARY: Today, you were seen for medication management and to evaluate your headache. We discussed your ongoing anxiety and mood symptoms, hypertension, diabetic neuropathy, menopausal symptoms, and smoking habits. We also addressed your recent headaches and knee pain.  YOUR PLAN: -GENERALIZED ANXIETY DISORDER AND DEPRESSION: You are currently managing your anxiety with Klonopin , Rexulti , Buspar , and Zoloft . You are tapering off Klonopin  from 2 mg to 0.5-1 mg daily. Buspar  is providing significant relief. We discussed the possibility of a psychiatry referral due to your concerns about continuing Klonopin . Stress-related tension headaches may be linked to your anxiety and hypertension. You will continue with your current medications and therapy for stress management. We prescribed 30 tablets of Klonopin  1 mg to last until your appointment with Dr. Heron.  -TENSION-TYPE HEADACHE: Your headache, which has lasted for a week, is likely due to stress and possibly high blood pressure. The pain is across the front of your head and is partially relieved by Tylenol . We recommend taking 1-2 tablets of Tylenol  as needed, not exceeding 3 grams per day, and continuing with stress management techniques through therapy.  -ESSENTIAL HYPERTENSION: You have high blood pressure and are taking amlodipine  10 mg daily. You missed your medication for a week, which may have contributed to your elevated blood pressure of 142/84 mmHg and tension headaches. You resumed your medication today. Please continue taking amlodipine  10 mg daily and monitor your blood pressure. We will reassess at the end of the month with Dr. Heron.  -TYPE 1 DIABETES MELLITUS WITH DIABETIC POLYNEUROPATHY: You have Type 1 diabetes with nerve pain, managed with gabapentin . Your last A1c was 9.6, indicating that your blood sugar control needs improvement. You see an endocrinologist every six months. We prescribed gabapentin  for your neuropathy and  encourage you to follow up with your endocrinologist for better diabetes management.  -MENOPAUSAL SYMPTOMS (HOT FLASHES): You are experiencing hot flashes, likely due to menopause. You have had a hysterectomy but still have your ovaries. The symptoms are short-lived and not currently bothersome. We will monitor your symptoms and discuss further with Dr. Heron if they become more troublesome.  -NICOTINE DEPENDENCE, CIGARETTES: You smoke about a pack and a quarter of cigarettes per day. We did not address smoking cessation during this visit, but it is important to consider quitting for your overall health.  INSTRUCTIONS: Please follow up with Dr. Heron at the end of the month to reassess your blood pressure and discuss any ongoing issues. Continue with your current medications and therapy, and monitor your symptoms. If you have any new or worsening symptoms, please contact our office.                      Contains text generated by Abridge.                                 Contains text generated by Abridge.

## 2024-04-23 NOTE — Progress Notes (Signed)
 Acute Office Visit  Subjective:     Patient ID: [name], female    DOB: 1976/03/24, 48 y.o.   MRN: [id number]  Chief Complaint  Patient presents with   Acute Visit    Medication management       Discussed the use of AI scribe software for clinical note transcription with the patient, who gave verbal consent to proceed.  History of Present Illness [name] is a 48 year old female who presents for medication management and headache evaluation.  Headache - Headache present for at least one week - Pain localized across the frontal region of the head - Attributes headache to stress  - Partial relief with Tylenol  and Advil - No associated nausea, vomiting, fever, chills, weakness, blurry vision, or difficulty walking - Headaches began around the time that she ran out of her amlodipine   Anxiety and mood symptoms - Currently tapering clonazepam  from 2 mg to 0.5-1 mg daily with goal of discontinuation - Previously on alprazolam , now taking brexpiprazole  and buspirone  - Buspirone  provides significant relief of anxiety symptoms - Engaged in therapy; does not see a psychiatrist - Increased stress recently, contributing to headache and muscle tension  Hypertension - Missed amlodipine  10 mg daily for approximately one week due to running out of medication - Resumed amlodipine  today after obtaining a refill - Blood pressure measured at 142/84 today  Diabetic neuropathy and glycemic control - Type 1 diabetes with neuropathy, managed with gabapentin  - Last hemoglobin A1c was 9.6 - Sees endocrinologist every six months  Vasomotor symptoms - Experiencing hot flashes, suspected to be menopausal in origin - History of hysterectomy with ovarian conservation  Tobacco use - Smokes one to one and one-quarter packs of cigarettes daily  Arthralgia and knee swelling - Arthritis under the patella - Intermittent knee swelling  Review of Systems  Constitutional:  Negative.   HENT: Negative.    Eyes: Negative.  Negative for blurred vision.  Respiratory: Negative.    Cardiovascular: Negative.   Gastrointestinal: Negative.   Musculoskeletal: Negative.   Neurological:  Positive for headaches. Negative for dizziness, sensory change, speech change, focal weakness and weakness.  Psychiatric/Behavioral:  The patient is nervous/anxious.         Objective:    BP (!) 142/84   Pulse 75   Temp (!) 97.5 F (36.4 C)   Ht 5' 4 (1.626 m)   Wt 118 lb 3.2 oz (53.6 kg)   SpO2 98%   BMI 20.29 kg/m    Physical Exam Constitutional:      Appearance: Normal appearance.  HENT:     Head: Normocephalic and atraumatic.  Cardiovascular:     Rate and Rhythm: Normal rate and regular rhythm.     Heart sounds: Normal heart sounds.  Pulmonary:     Effort: Pulmonary effort is normal.     Breath sounds: Normal breath sounds. No wheezing, rhonchi or rales.  Abdominal:     General: Bowel sounds are normal. There is no distension.     Palpations: Abdomen is soft.     Tenderness: There is no abdominal tenderness. There is no guarding or rebound.  Musculoskeletal:        General: No swelling or tenderness.     Right lower leg: No edema.     Left lower leg: No edema.  Neurological:     Mental Status: She is alert.  Psychiatric:        Mood and Affect: Mood normal.  Behavior: Behavior normal.     No results found for any visits on 04/23/24.      Assessment & Plan:   Problem List Items Addressed This Visit       Cardiovascular and Mediastinum   Hypertension associated with diabetes (HCC) - Primary   - This problem is chronic and stable -Patient noted to have mild elevated blood pressure today at 142/84 -She had run out of her amlodipine  approximately 1 week ago and restarted taking it this morning -I suspect her high blood pressure is likely secondary to running out of her medication -Amlodipine  refill called in for the patient as well -No  further workup at this time -Patient has a follow-up appointment with her PCP at the end of this month and we will recheck her blood pressure at that time      Relevant Medications   atorvastatin  (LIPITOR) 10 MG tablet   amLODipine  (NORVASC ) 10 MG tablet     Endocrine   Diabetic peripheral neuropathy (HCC)   - This problem is chronic and stable -Patient has diabetic peripheral neuropathy and is currently on gabapentin  300 mg 3 times a day for this -I have put in a refill of gabapentin  for her -No further workup at this time      Relevant Medications   gabapentin  (NEURONTIN ) 300 MG capsule   atorvastatin  (LIPITOR) 10 MG tablet   clonazePAM  (KLONOPIN ) 1 MG tablet   Type 1 diabetes mellitus with complications (HCC)   - This problem is chronic and stable -Patient is currently on insulin  pump and follows up with endocrinology regularly for this (every 6 months) -Her last A1c was 9.6 -She will follow-up with her endocrinologist for further evaluation and management      Relevant Medications   atorvastatin  (LIPITOR) 10 MG tablet     Other   Hyperlipidemia   - This problem is chronic and stable -Patient is on Lipitor 10 mg daily for this.  I did refill this medication for her -Will need to repeat lipid panel at next visit with her PCP (at the end of this month) -No further workup at this time      Relevant Medications   atorvastatin  (LIPITOR) 10 MG tablet   amLODipine  (NORVASC ) 10 MG tablet   Hyponatremia   - Patient has a history of chronic hyponatremia likely secondary to her psychiatric medications -Her last sodium was 120 back in March -We will recheck her BMP today -No further workup at this time      Relevant Orders   Basic metabolic panel with GFR   Mood disorder   - Patient has a history of significant anxiety and is on a Klonopin  taper at home -She is currently down from 2 mg of Klonopin  a day to 0.5 to 1 mg daily -She is also on Zoloft , Rexulti  and BuSpar  for  her anxiety -She needs a refill of her Klonopin  till she can meet her new PCP (at the end of the month) -Will refill her Klonopin  (30 pills) today -Patient does follow-up with therapist as an outpatient but may need referral to psychiatry given that she is unable to taper off her Klonopin  -Current GAD-7 and PHQ-9 scores are 0 -No further workup at this time      Relevant Medications   clonazePAM  (KLONOPIN ) 1 MG tablet   Other Visit Diagnoses       Hypertension, unspecified type       Relevant Medications   atorvastatin  (LIPITOR) 10 MG tablet  amLODipine  (NORVASC ) 10 MG tablet     Elevated cholesterol       Relevant Medications   atorvastatin  (LIPITOR) 10 MG tablet   amLODipine  (NORVASC ) 10 MG tablet     Controlled substance agreement signed       Relevant Medications   clonazePAM  (KLONOPIN ) 1 MG tablet       Meds ordered this encounter  Medications   gabapentin  (NEURONTIN ) 300 MG capsule    Sig: Take 1 capsule (300 mg total) by mouth 3 (three) times daily.    Dispense:  270 capsule    Refill:  1   atorvastatin  (LIPITOR) 10 MG tablet    Sig: Take 1 tablet (10 mg total) by mouth at bedtime.    Dispense:  90 tablet    Refill:  1   clonazePAM  (KLONOPIN ) 1 MG tablet    Sig: Take 1 tablet (1 mg total) by mouth daily. Further refill by PCP    Dispense:  30 tablet    Refill:  0   amLODipine  (NORVASC ) 10 MG tablet    Sig: Take 1 tablet (10 mg total) by mouth daily.    Dispense:  90 tablet    Refill:  0    Pt must keep  scheduled followup appt with Cardiology in November 2025 for any more refills. Thank You    No follow-ups on file.  Shelley Cocke, MD

## 2024-04-23 NOTE — Assessment & Plan Note (Signed)
-   This problem is chronic and stable -Patient has diabetic peripheral neuropathy and is currently on gabapentin  300 mg 3 times a day for this -I have put in a refill of gabapentin  for her -No further workup at this time

## 2024-04-23 NOTE — Assessment & Plan Note (Addendum)
-   This problem is chronic and stable -Patient is on Lipitor 10 mg daily for this.  I did refill this medication for her -Will need to repeat lipid panel at next visit with her PCP (at the end of this month) -No further workup at this time

## 2024-04-23 NOTE — Assessment & Plan Note (Signed)
-   This problem is chronic and stable -Patient is currently on insulin  pump and follows up with endocrinology regularly for this (every 6 months) -Her last A1c was 9.6 -She will follow-up with her endocrinologist for further evaluation and management

## 2024-04-25 ENCOUNTER — Ambulatory Visit: Payer: Self-pay | Admitting: Internal Medicine

## 2024-04-25 NOTE — Telephone Encounter (Signed)
 Copied from CRM [id number]. Topic: General - Other >> Apr 25, 2024  4:17 PM Sasha M wrote: Reason for CRM: Pt returning Ellamae Lybeck phone call. Pt states she is available any time time today or on Monday around lunch time. See result note

## 2024-05-12 ENCOUNTER — Ambulatory Visit (INDEPENDENT_AMBULATORY_CARE_PROVIDER_SITE_OTHER): Payer: MEDICAID

## 2024-05-12 ENCOUNTER — Ambulatory Visit: Payer: Self-pay

## 2024-05-12 ENCOUNTER — Other Ambulatory Visit: Payer: Self-pay

## 2024-05-12 VITALS — BP 118/70 | HR 73 | Temp 97.8°F | Ht 64.0 in | Wt 115.0 lb

## 2024-05-12 DIAGNOSIS — K219 Gastro-esophageal reflux disease without esophagitis: Secondary | ICD-10-CM | POA: Diagnosis not present

## 2024-05-12 DIAGNOSIS — Z79899 Other long term (current) drug therapy: Secondary | ICD-10-CM

## 2024-05-12 DIAGNOSIS — E108 Type 1 diabetes mellitus with unspecified complications: Secondary | ICD-10-CM | POA: Diagnosis not present

## 2024-05-12 DIAGNOSIS — E782 Mixed hyperlipidemia: Secondary | ICD-10-CM

## 2024-05-12 DIAGNOSIS — M5442 Lumbago with sciatica, left side: Secondary | ICD-10-CM

## 2024-05-12 DIAGNOSIS — F1320 Sedative, hypnotic or anxiolytic dependence, uncomplicated: Secondary | ICD-10-CM

## 2024-05-12 DIAGNOSIS — G2581 Restless legs syndrome: Secondary | ICD-10-CM

## 2024-05-12 DIAGNOSIS — J4489 Other specified chronic obstructive pulmonary disease: Secondary | ICD-10-CM

## 2024-05-12 DIAGNOSIS — Z720 Tobacco use: Secondary | ICD-10-CM

## 2024-05-12 DIAGNOSIS — E559 Vitamin D deficiency, unspecified: Secondary | ICD-10-CM | POA: Diagnosis not present

## 2024-05-12 DIAGNOSIS — E1069 Type 1 diabetes mellitus with other specified complication: Secondary | ICD-10-CM

## 2024-05-12 DIAGNOSIS — F410 Panic disorder [episodic paroxysmal anxiety] without agoraphobia: Secondary | ICD-10-CM | POA: Insufficient documentation

## 2024-05-12 DIAGNOSIS — M25562 Pain in left knee: Secondary | ICD-10-CM

## 2024-05-12 DIAGNOSIS — I152 Hypertension secondary to endocrine disorders: Secondary | ICD-10-CM

## 2024-05-12 DIAGNOSIS — F39 Unspecified mood [affective] disorder: Secondary | ICD-10-CM

## 2024-05-12 DIAGNOSIS — E871 Hypo-osmolality and hyponatremia: Secondary | ICD-10-CM

## 2024-05-12 DIAGNOSIS — E103492 Type 1 diabetes mellitus with severe nonproliferative diabetic retinopathy without macular edema, left eye: Secondary | ICD-10-CM

## 2024-05-12 DIAGNOSIS — M25561 Pain in right knee: Secondary | ICD-10-CM

## 2024-05-12 DIAGNOSIS — G8929 Other chronic pain: Secondary | ICD-10-CM

## 2024-05-12 DIAGNOSIS — E1142 Type 2 diabetes mellitus with diabetic polyneuropathy: Secondary | ICD-10-CM

## 2024-05-12 LAB — LIPID PANEL
Cholesterol: 197 mg/dL (ref 0–200)
HDL: 69.6 mg/dL (ref >39.00)
LDL Cholesterol: 109 mg/dL — ABNORMAL HIGH (ref 0–99)
NonHDL: 126.96
Total CHOL/HDL Ratio: 3
Triglycerides: 88 mg/dL (ref 0.0–149.0)
VLDL: 17.6 mg/dL (ref 0.0–40.0)

## 2024-05-12 LAB — BASIC METABOLIC PANEL WITH GFR
BUN: 5 mg/dL — ABNORMAL LOW (ref 6–23)
CO2: 23 meq/L (ref 19–32)
Calcium: 9.9 mg/dL (ref 8.4–10.5)
Chloride: 86 meq/L — ABNORMAL LOW (ref 96–112)
Creatinine, Ser: 0.67 mg/dL (ref 0.40–1.20)
GFR: 103.61 mL/min (ref >60.00)
Glucose, Bld: 211 mg/dL — ABNORMAL HIGH (ref 70–99)
Potassium: 4.5 meq/L (ref 3.5–5.1)
Sodium: 123 meq/L — ABNORMAL LOW (ref 135–145)

## 2024-05-12 LAB — HEMOGLOBIN A1C: Hgb A1c MFr Bld: 9.9 % — ABNORMAL HIGH (ref 4.6–6.5)

## 2024-05-12 LAB — VITAMIN D 25 HYDROXY (VIT D DEFICIENCY, FRACTURES): VITD: 37.74 ng/mL (ref 30.00–100.00)

## 2024-05-12 LAB — VITAMIN B12: Vitamin B-12: 598 pg/mL (ref 211–911)

## 2024-05-12 MED ORDER — BUSPIRONE HCL 15 MG PO TABS: 15.0000 mg | ORAL_TABLET | Freq: Three times a day (TID) | ORAL | 2 refills | Status: AC

## 2024-05-12 MED ORDER — OMEPRAZOLE 40 MG PO CPDR: 40.0000 mg | DELAYED_RELEASE_CAPSULE | Freq: Every day | ORAL | 2 refills | Status: DC

## 2024-05-12 MED ORDER — CLONAZEPAM 1 MG PO TABS: 1.0000 mg | ORAL_TABLET | Freq: Every day | ORAL | 0 refills | Status: DC

## 2024-05-12 MED ORDER — BREXPIPRAZOLE 1 MG PO TABS: 1.0000 mg | ORAL_TABLET | Freq: Every day | ORAL | 0 refills | Status: AC

## 2024-05-12 NOTE — Assessment & Plan Note (Signed)
 Chronic,involving b/l lower legs, managed with Gabapentin  300 mg 3 times a day for this. Continue. She does not need refill at this time.

## 2024-05-12 NOTE — Progress Notes (Signed)
 Please call the patient to let her know her A1c is above goal at 9.9%, strongly encourage she schedules appointment with her endocrinologist Dr. Mercie as discussed during her office visit earlier today.  Her sodium is low but stable, this is sometime we will continue to monitor. Cholesterol shows elevated LDL or bad cholesterol, her goal LDL is less than 70. I recommend adhering to taking daily Atorvastatin  10 mg. If in the future her LLD cholesterol is above goal we will have to increase dose of Atorvastatin . Her vitamin D  is normalized so I recommend continuing to take over the counter vitamin D  as she has been. Her B 12 is improved as well.   Thank you,  Luke Shade, MD

## 2024-05-12 NOTE — Assessment & Plan Note (Addendum)
 Known history of panic disorder, anxiety, depression. This is chronic and stable. No SI/HI. Reviewed PHQ-9, GAD-7. Currently on Clonazepam  0.5 mg to 1 mg daily, which is down from 2 mg daily. Will continue to work on tapering down dose with goal of complete cessation. Plan per Benzodiazepine dependence as well.  Continue Zoloft  100 mg daily, Rexulti  1 mg daily and Buspar  15 mg three times a day.  Continue psychologist visit for counseling.  Patient is on prn Percocet from pain clinic.  Given use of opioids,benzodiazepine counseled increased risk of central depression and caution with medication. Given use of Rexulti  and Ropinirole  watch for change in motor function. Referral to psychiatry done today to help with medication management.

## 2024-05-12 NOTE — Assessment & Plan Note (Signed)
 Chronic, BP goal < 130/80 mmHg. Only on Amlodipine  10 mg daily. Not able to tolerate ACEi/ARBs in the past due to dizziness. Check urine microalbumin and consider reducing dose of Amlodipine  with addition of Acei/ARB for renovascular protection.  Will discuss during f/u visit in 6 to 8 weeks. Check BMP.

## 2024-05-12 NOTE — Assessment & Plan Note (Signed)
 Chronic, stable. Continue f/u with pain clinic.

## 2024-05-12 NOTE — Assessment & Plan Note (Signed)
 Chronic. On Insulin  pump. Recommend patient schedule a follow up appointment with endocrinologist and ophthalmologist.  Not on ACEi/ARB (due to feeling dizziness on lisinopril  in the past). On Atorvastatin  10 mg daily, continue.  Check A1c today.

## 2024-05-12 NOTE — Assessment & Plan Note (Signed)
 Chronic and asymptomatic. Patient euvolemic.  Likely multifactorial. Check BMP.

## 2024-05-12 NOTE — Assessment & Plan Note (Signed)
 Plan per mood disorder.

## 2024-05-12 NOTE — Assessment & Plan Note (Signed)
 Chronic with multiple complications including diabetic retinopathy, htn, hyperlipidemia, neuropathy. Patient counseled on lifestyle modifications and regular endocrinology and ophthalmology follow up. We will check A1c today. Recommend f/u with endocrinologist Dr. Mercie. Continue gabapentin  300 mg three times daily. Continue current insulin  therapy as recommended by endocrinologist. Continue Atorvastatin  10 mg daily. Consider addition of Acei/ARB.

## 2024-05-12 NOTE — Assessment & Plan Note (Signed)
 This is chronic. She is already seeing pain clinic for back pain and is on Percocet for this. She is also using Voltaren gel daily on knees. I recommend using Voltaren gel 1% up to 4 times a day, avoiding triggering activities, ice/heat to help with pain.

## 2024-05-12 NOTE — Assessment & Plan Note (Signed)
 Long-term tobacco use disorder. Currently smoking 1.5 PPD. Smoking cessation discussed, not ready to quit due to anxiety and stress. Provide information on Quitline [place]  for smoking cessation support. I will continue to discuss this with the patient.

## 2024-05-12 NOTE — Assessment & Plan Note (Addendum)
 This is chronic, stable with daily. Patient is prescribed Symbicort  from her previous PCP. Review of previous plan from PCP appears to show that she has had trial of Breztri  inhaler but she does not recall which one she is using. Her last PFT appears to be in 08/12/2018. She has seen  pulmonology (Dr. Kassie) in the past. She continues to smoke 1.5 packs of cigarettes daily. Smoking cessation counseling provided. Also recommend follow up with pulmonology.

## 2024-05-12 NOTE — Patient Instructions (Addendum)
 Please call Dr. Eugenio office (diabetes doctor) to schedule an appointment. We are checking your A1c today, this should be good for three months so you do not need to get repeat A1c when you go to see him. Recommend seeing him within next 1-2 weeks.  You are due for diabetic eye exam, please check with your insurance to see where you can go for annual diabetic eye exam.      Call [place]uitline[place] to help with smoking cessation as well:  Get free tobacco cessation help 24/7 in several ways: 1-800-QUIT-NOW 860 251 9827); Espaol: 1-855-Djelo-Ya (1-251-569-5910) o para ms informacin haga clic aqu ; Interpretation services available for many languages; Register online ( en espaol ) TTY: 956-321-1088 [place] Quitline: Call 888-7AI-QUIT 818-119-7072)  I am checking your kidney function, electrolytes, cholesterol, B12, vitamin D  level and urine microalbumin or protein to evaluate your kidney function. I will let you know once I have the lab results.   Follow up in 6-8 weeks with me.   Please call your pharmacy as you get closer to needing refill on your medications.

## 2024-05-12 NOTE — Assessment & Plan Note (Addendum)
 Known h/o vitamin D  deficiency, repeat vitamin D  level today. Continue taking vitamin D  2000 units daily.

## 2024-05-12 NOTE — Assessment & Plan Note (Signed)
 Chronic history of Clonazepam  use for panic disorder, anxiety.  Currently on Clonazepam  0.5 mg to1 mg daily with goal of complete cessation of benzodiazepine. Long discussion on potential s/e of Clonazepam  discussed and recommend continuing tapering.  PDMP reviewed last refill for 30 tablets of Clonazepam  1 mg was done on 04/23/24. Future refill starting 05/21/24 for 1 month was done. Follow up in 6-8 weeks.

## 2024-05-12 NOTE — Assessment & Plan Note (Signed)
 Chronic, with symptoms stable on Omeprazole  40 mg twice a day, continue. Check B 12 today.

## 2024-05-12 NOTE — Assessment & Plan Note (Signed)
 Chronic, a/w type I DM. Continue Atorvastatin  10 mg daily. Check lipid panel.

## 2024-05-12 NOTE — Assessment & Plan Note (Signed)
 Chronic and stable on Ropinirole  1 mg at night, continue.

## 2024-05-12 NOTE — Progress Notes (Signed)
 Established Patient Office Visit   Subjective  Patient ID: [name], female    DOB: [birth year]  Age: 48 y.o. MRN: [id number]  Chief Complaint  Patient presents with   Establish Care    She  has a past medical history of Absence of bladder continence (04/01/2010), Acid reflux (04/01/2010), Acute gastroenteritis (03/14/2022), Anxiety state, unspecified (03/26/2009), Arthralgia (08/14/2018), ASTHMA (02/02/2007), Asthma, Chest wall pain (08/02/2018), Cholesteatoma (09/11/2011), Closed fracture of fifth metatarsal bone (02/21/2023), Closed fracture of lateral malleolus (03/07/2023), DEPRESSION (02/16/2009), DIABETES MELLITUS, TYPE I (02/02/2007), Dizziness (09/18/2018), Eczema (08/02/2018), Elevated cholesterol (06/02/2022), Encounter to establish care (06/02/2022), Endometriosis, Essential hypertension, Fracture of base of fifth metatarsal bone (02/21/2023), GERD (12/21/2008), Herpes, Hyperlipidemia, Hypertension (03/03/2020), Lesion of vocal cord, Mild intermittent asthma without complication (08/12/2018), Osteoarthritis, Peripheral polyneuropathy (11/29/2017), Primary osteoarthritis of both feet (11/30/2019), Primary osteoarthritis of both hands (11/30/2019), Retinal hemorrhage of right eye (11/04/2019), Severe nonproliferative diabetic retinopathy of right eye, without macular edema, associated with type 1 diabetes mellitus (HCC) (11/04/2019), Shortness of breath (09/25/2007), Uncontrolled type 1 diabetes mellitus with hyperglycemia, with long-term current use of insulin  (HCC) (03/14/2022), Uncontrolled type 2 diabetes mellitus with hyperglycemia, with long-term current use of insulin  (HCC) (03/14/2022), Vitreous hemorrhage of right eye (HCC) (08/16/2021), and Vocal fold leukoplakia.  HPI Discussed the use of AI scribe software for clinical note transcription with the patient, who gave verbal consent to proceed.  History of Present Illness [name] is a 48 year old female with  anxiety, panic disorder, chronic hyponatremia, type 1 diabetes who presents for medication management and transfer of care from previous PCP who is no longer in the practice.   - Anxiety and panic attacks with Klonopin  (0.5 mg to 1 mg daily), Buspar  (15 mg three times a day), and Zoloft  100 mg daily), Rexulti  1 mg daily. She has been tapering Klonopin  from 1 mg to 0.5 mg on some days. Previously tried shorted acting benzodiazepines. This is tapered down dose from 2 mg daily previously. Her goal is to stop Clonazepam  completely.   She has previously seen psychiatrist, not currently. She does virtual counseling.   - Type I DM with complications (neuropathy, hyperlipidemia, h/o retinopathy, hypertension): She has type 1 diabetes and follows up with an endocrinologist every six months. She uses NovoLog  insulin  and her last A1c from 11/2023 was 9.6%. She experiences diabetic neuropathy in her legs and feet, managed with gabapentin  300 mg three times a day. Previously was taking Lisinopril  which caused her to feel dizzy. Currently only on Amlodipine  10 mg daily and atorvastatin  10 mg daily.   - Asthma, daily nicotine use: She uses Symbicort  inhaler daily. She smokes cigarettes daily, has been since she was 48 years of age. Currently smoking 1.5 pack and is not interested in smoking cessation at this time.  She does not consume alcohol.  - She has arthritis of back, knees. She f/u with pain clinic and is currently on Percocet which is prescribed by pain clinic.   - GERD: She takes omeprazole  40 mg daily for acid reflux, which causes vomiting and heartburn if not taken.   - Has a h/o vitamin D  deficiency. She also takes a daily multivitamin and vitamin D3 2000 units.  - She is due for an eye exam and has a history of retinal issues requiring injections.   - She follows up with [facility] ob/gyn, recently had mammogram and PAP done through them but we do not have records of this. She is planning on getting  the results sent to us .   - Hyponatremia: She has a history of low sodium levels, which she attributes to high fluid intake and diabetes.  ROS As per HPI    Objective:     BP 118/70 (BP Location: Right Arm, Patient Position: Sitting, Cuff Size: Normal)   Pulse 73   Temp 97.8 F (36.6 C) (Oral)   Ht 5' 4 (1.626 m)   Wt 115 lb (52.2 kg)   SpO2 96%   BMI 19.74 kg/m      05/12/2024    9:04 AM 04/23/2024    2:00 PM 04/23/2024    1:10 PM  Depression screen PHQ 2/9  Decreased Interest 0 1 0  Down, Depressed, Hopeless 0 1 0  PHQ - 2 Score 0 2 0  Altered sleeping 0 0 0  Tired, decreased energy 0 0 0  Change in appetite 0 0 0  Feeling bad or failure about yourself  0 0 0  Trouble concentrating 0 0 0  Moving slowly or fidgety/restless 0 0 0  Suicidal thoughts 0 0 0  PHQ-9 Score 0 2 0  Difficult doing work/chores Not difficult at all Not difficult at all Not difficult at all      05/12/2024    9:05 AM 04/23/2024    2:00 PM 04/23/2024    1:10 PM 12/24/2023   12:57 PM  GAD 7 : Generalized Anxiety Score  Nervous, Anxious, on Edge 2 2 0 2  Control/stop worrying 1 1 0 2  Worry too much - different things 1 1 0 2  Trouble relaxing 1 1 0 1  Restless 1 0 0 0  Easily annoyed or irritable 0 0 0 0  Afraid - awful might happen 1 1 0 1  Total GAD 7 Score 7 6 0 8  Anxiety Difficulty  Somewhat difficult Not difficult at all Somewhat difficult      05/12/2024    9:04 AM 04/23/2024    2:00 PM 04/23/2024    1:10 PM  Depression screen PHQ 2/9  Decreased Interest 0 1 0  Down, Depressed, Hopeless 0 1 0  PHQ - 2 Score 0 2 0  Altered sleeping 0 0 0  Tired, decreased energy 0 0 0  Change in appetite 0 0 0  Feeling bad or failure about yourself  0 0 0  Trouble concentrating 0 0 0  Moving slowly or fidgety/restless 0 0 0  Suicidal thoughts 0 0 0  PHQ-9 Score 0 2 0  Difficult doing work/chores Not difficult at all Not difficult at all Not difficult at all      05/12/2024    9:05 AM  04/23/2024    2:00 PM 04/23/2024    1:10 PM 12/24/2023   12:57 PM  GAD 7 : Generalized Anxiety Score  Nervous, Anxious, on Edge 2 2 0 2  Control/stop worrying 1 1 0 2  Worry too much - different things 1 1 0 2  Trouble relaxing 1 1 0 1  Restless 1 0 0 0  Easily annoyed or irritable 0 0 0 0  Afraid - awful might happen 1 1 0 1  Total GAD 7 Score 7 6 0 8  Anxiety Difficulty  Somewhat difficult Not difficult at all Somewhat difficult   SDOH Screenings   Food Insecurity: Patient Declined (09/24/2023)  Housing: Patient Declined (09/24/2023)  Transportation Needs: Patient Declined (09/24/2023)  Utilities: Patient Declined (03/13/2023)   Received from [facility]  Alcohol Screen: Low Risk  (  05/31/2022)  Depression (PHQ2-9): Low Risk  (05/12/2024)  Financial Resource Strain: Patient Declined (09/24/2023)  Physical Activity: Unknown (09/24/2023)  Social Connections: Unknown (09/24/2023)  Stress: Patient Declined (09/24/2023)  Tobacco Use: High Risk (05/12/2024)     Physical Exam Constitutional:      Appearance: Normal appearance.  HENT:     Head: Normocephalic and atraumatic.     Right Ear: Tympanic membrane and ear canal normal.     Left Ear: Tympanic membrane and ear canal normal.     Mouth/Throat:     Mouth: Mucous membranes are moist.  Neck:     Thyroid : No thyroid  mass or thyroid  tenderness.  Cardiovascular:     Rate and Rhythm: Normal rate and regular rhythm.  Pulmonary:     Effort: Pulmonary effort is normal.     Breath sounds: Normal breath sounds. No wheezing.  Abdominal:     General: Bowel sounds are normal.     Palpations: Abdomen is soft.     Tenderness: There is no abdominal tenderness. There is no guarding.  Musculoskeletal:     Cervical back: Neck supple. No rigidity.     Right lower leg: No edema.     Left lower leg: No edema.  Skin:    General: Skin is warm.  Neurological:     Mental Status: She is alert and oriented to person, place, and time.  Psychiatric:         Mood and Affect: Mood normal.        Behavior: Behavior normal. Behavior is cooperative.        Thought Content: Thought content does not include suicidal ideation. Thought content does not include suicidal plan.        No results found for any visits on 05/12/24.  The ASCVD Risk score (Arnett DK, et al., 2019) failed to calculate for the following reasons:   Cannot find a previous HDL lab   Cannot find a previous total cholesterol lab     Assessment & Plan:  Patient is a pleasant 48 year old female presenting for transfer of care from previous PCP. Patient gets cervical cancer screening, breast screening done through her ob/gyn at [facility] and will have medical records released for us  to review.    Type 1 diabetes mellitus with complications (HCC) Assessment & Plan: Chronic with multiple complications including diabetic retinopathy, htn, hyperlipidemia, neuropathy. Patient counseled on lifestyle modifications and regular endocrinology and ophthalmology follow up. We will check A1c today. Recommend f/u with endocrinologist Dr. Mercie. Continue gabapentin  300 mg three times daily. Continue current insulin  therapy as recommended by endocrinologist. Continue Atorvastatin  10 mg daily. Consider addition of Acei/ARB.    Orders: -     Microalbumin / creatinine urine ratio -     Basic metabolic panel with GFR  Mood disorder Assessment & Plan: Known history of panic disorder, anxiety, depression. This is chronic and stable. No SI/HI. Reviewed PHQ-9, GAD-7. Currently on Clonazepam  0.5 mg to 1 mg daily, which is down from 2 mg daily. Will continue to work on tapering down dose with goal of complete cessation. Plan per Benzodiazepine dependence as well.  Continue Zoloft  100 mg daily, Rexulti  1 mg daily and Buspar  15 mg three times a day.  Continue psychologist visit for counseling.  Patient is on prn Percocet from pain clinic.  Given use of opioids,benzodiazepine  counseled increased risk of central depression and caution with medication. Given use of Rexulti  and Ropinirole  watch for change in motor  function. Referral to psychiatry done today to help with medication management.    Orders: -     Ambulatory referral to Psychiatry -     Brexpiprazole ; Take 1 tablet (1 mg total) by mouth daily.  Dispense: 90 tablet; Refill: 0 -     busPIRone  HCl; Take 1 tablet (15 mg total) by mouth 3 (three) times daily.  Dispense: 90 tablet; Refill: 2 -     clonazePAM ; Take 1 tablet (1 mg total) by mouth daily. Further refill by PCP  Dispense: 30 tablet; Refill: 0  Hypertension associated with diabetes (HCC) Assessment & Plan: Chronic, BP goal < 130/80 mmHg. Only on Amlodipine  10 mg daily. Not able to tolerate ACEi/ARBs in the past due to dizziness. Check urine microalbumin and consider reducing dose of Amlodipine  with addition of Acei/ARB for renovascular protection.  Will discuss during f/u visit in 6 to 8 weeks. Check BMP.   Panic disorder without agoraphobia Assessment & Plan: Plan per mood disorder.    Vitamin D  deficiency Assessment & Plan: Known h/o vitamin D  deficiency, repeat vitamin D  level today. Continue taking vitamin D  2000 units daily.   Orders: -     VITAMIN D  25 Hydroxy (Vit-D Deficiency, Fractures)  Restless leg Assessment & Plan: Chronic and stable on Ropinirole  1 mg at night, continue.   Orders: -     Hemoglobin A1c  Mixed hyperlipidemia Assessment & Plan: Chronic, a/w type I DM. Continue Atorvastatin  10 mg daily. Check lipid panel.   Orders: -     Lipid panel  Controlled substance agreement signed -     clonazePAM ; Take 1 tablet (1 mg total) by mouth daily. Further refill by PCP  Dispense: 30 tablet; Refill: 0  Severe nonproliferative diabetic retinopathy of left eye, without macular edema, associated with type 1 diabetes mellitus (HCC)  Tobacco use Assessment & Plan: Long-term tobacco use disorder. Currently smoking 1.5 PPD.  Smoking cessation discussed, not ready to quit due to anxiety and stress. Provide information on Quitline [place]  for smoking cessation support. I will continue to discuss this with the patient.    Hyponatremia Assessment & Plan: Chronic and asymptomatic. Patient euvolemic.  Likely multifactorial. Check BMP.    Gastroesophageal reflux disease without esophagitis Assessment & Plan: Chronic, with symptoms stable on Omeprazole  40 mg twice a day, continue. Check B 12 today.   Orders: -     Vitamin B12  Diabetic peripheral neuropathy (HCC) Assessment & Plan: Chronic,involving b/l lower legs, managed with Gabapentin  300 mg 3 times a day for this. Continue. She does not need refill at this time.     Asthma-COPD overlap syndrome [facility]) Assessment & Plan: This is chronic, stable with daily. Patient is prescribed Symbicort  from her previous PCP. Review of previous plan from PCP appears to show that she has had trial of Breztri  inhaler but she does not recall which one she is using. Her last PFT appears to be in 08/12/2018. She has seen [place] pulmonology (Dr. Kassie) in the past. She continues to smoke 1.5 packs of cigarettes daily. Smoking cessation counseling provided. Also recommend follow up with pulmonology.     Benzodiazepine dependence (HCC) Assessment & Plan: Chronic history of Clonazepam  use for panic disorder, anxiety.  Currently on Clonazepam  0.5 mg to1 mg daily with goal of complete cessation of benzodiazepine. Long discussion on potential s/e of Clonazepam  discussed and recommend continuing tapering.  PDMP reviewed last refill for 30 tablets of Clonazepam  1 mg was done on 04/23/24. Future refill starting  05/21/24 for 1 month was done. Follow up in 6-8 weeks.     Type 1 diabetes mellitus with other specified complication [facility]) Assessment & Plan: Chronic. On Insulin  pump. Recommend patient schedule a follow up appointment with endocrinologist and ophthalmologist.  Not on  ACEi/ARB (due to feeling dizziness on lisinopril  in the past). On Atorvastatin  10 mg daily, continue.  Check A1c today.    Chronic pain of both knees Assessment & Plan: This is chronic. She is already seeing pain clinic for back pain and is on Percocet for this. She is also using Voltaren gel daily on knees. I recommend using Voltaren gel 1% up to 4 times a day, avoiding triggering activities, ice/heat to help with pain.    Chronic bilateral low back pain with left-sided sciatica Assessment & Plan: Chronic, stable. Continue f/u with pain clinic.    Other orders -     Omeprazole ; Take 1 capsule (40 mg total) by mouth daily. TAKE 1 CAPSULE TWICE A DAY. CALL [phone] TO SCHEDULE AN APPOINTMENT (NEEDS FOR FUTURE REFILLS)  Dispense: 90 capsule; Refill: 2   I personally spent a total of 50 minutes in the care of the patient today including preparing to see the patient, getting/reviewing separately obtained history, performing a medically appropriate exam/evaluation, counseling and educating, placing orders, referring and communicating with other health care professionals, documenting clinical information in the EHR, independently interpreting results, and communicating results.   Return in about 8 weeks (around 07/07/2024) for 6-8 weeks for chronic follow up/mood .   Luke Shade, MD

## 2024-05-18 ENCOUNTER — Other Ambulatory Visit: Payer: Self-pay

## 2024-05-18 MED ORDER — OMEPRAZOLE 40 MG PO CPDR: 40.0000 mg | DELAYED_RELEASE_CAPSULE | Freq: Every day | ORAL | 3 refills | Status: AC

## 2024-05-19 ENCOUNTER — Encounter: Payer: Self-pay | Admitting: *Deleted

## 2024-05-20 ENCOUNTER — Other Ambulatory Visit: Payer: Self-pay | Admitting: Endocrinology

## 2024-05-20 DIAGNOSIS — E1065 Type 1 diabetes mellitus with hyperglycemia: Secondary | ICD-10-CM

## 2024-05-21 ENCOUNTER — Encounter: Payer: Self-pay | Admitting: *Deleted

## 2024-05-21 NOTE — Progress Notes (Signed)
Pt viewed results via MyChart

## 2024-05-22 ENCOUNTER — Ambulatory Visit: Payer: MEDICAID | Attending: Internal Medicine | Admitting: Internal Medicine

## 2024-05-22 VITALS — BP 128/62 | HR 78 | Ht 64.0 in | Wt 117.6 lb

## 2024-05-22 DIAGNOSIS — E108 Type 1 diabetes mellitus with unspecified complications: Secondary | ICD-10-CM | POA: Diagnosis not present

## 2024-05-22 DIAGNOSIS — E785 Hyperlipidemia, unspecified: Secondary | ICD-10-CM

## 2024-05-22 DIAGNOSIS — I10 Essential (primary) hypertension: Secondary | ICD-10-CM | POA: Diagnosis not present

## 2024-05-22 MED ORDER — ATORVASTATIN CALCIUM 10 MG PO TABS: 10.0000 mg | ORAL_TABLET | Freq: Every day | ORAL | 3 refills | Status: AC

## 2024-05-22 MED ORDER — AMLODIPINE BESYLATE 10 MG PO TABS: 10.0000 mg | ORAL_TABLET | Freq: Every day | ORAL | 3 refills | Status: AC

## 2024-05-22 NOTE — Patient Instructions (Signed)
 Medication Instructions:  Your physician recommends that you continue on your current medications as directed. Please refer to the Current Medication list given to you today.  *If you need a refill on your cardiac medications before your next appointment, please call your pharmacy*  Lab Work: NONE ordered at this time of appointment   Testing/Procedures: NONE ordered at this time of appointment   Follow-Up: At [facility], you and your health needs are our priority.  As part of our continuing mission to provide you with exceptional heart care, our providers are all part of one team.  This team includes your primary Cardiologist (physician) and Advanced Practice Providers or APPs (Physician Assistants and Nurse Practitioners) who all work together to provide you with the care you need, when you need it.  Your next appointment:   1 year(s)  Provider:   Any APP  We recommend signing up for the patient portal called MyChart.  Sign up information is provided on this After Visit Summary.  MyChart is used to connect with patients for Virtual Visits (Telemedicine).  Patients are able to view lab/test results, encounter notes, upcoming appointments, etc.  Non-urgent messages can be sent to your provider as well.   To learn more about what you can do with MyChart, go to [email or web address].

## 2024-05-22 NOTE — Progress Notes (Signed)
  Cardiology Office Note:  .   Date:  05/22/2024  ID:  [name], DOB 24-Jul-1975, MRN [id number] PCP: Abbey Bruckner, MD  [facility] Providers Cardiologist:  Soyla DELENA Merck, MD    History of Present Illness: [name] is a 48 y.o. female.  Discussed the use of AI scribe software for clinical note transcription with the patient, who gave verbal consent to proceed.  History of Present Illness [name] is a 48 year old female who presents for a cardiovascular follow-up.  She experiences chest discomfort during panic attacks, describing her chest as feeling 'crazy' during these episodes, but is not currently experiencing significant chest pain. Her hypertension is managed with amlodipine  10 mg daily, with variable blood pressure readings that are higher during anxiety and normal on calm days. She sometimes avoids checking her blood pressure at home due to anxiety. She is on atorvastatin  10 mg daily for cholesterol management, and her calcium  score from 2021 was zero. Her diabetes is managed by an endocrinologist with an insulin  pump.    ROS: negative except per HPI above.  Studies Reviewed: SABRA   EKG Interpretation Date/Time:  Thursday May 22 2024 09:39:08 EST Ventricular Rate:  78 PR Interval:  118 QRS Duration:  84 QT Interval:  376 QTC Calculation: 428 R Axis:   82  Text Interpretation: Normal sinus rhythm Normal ECG When compared with ECG of 16-Apr-2022 15:52, Vent. rate has decreased BY  42 BPM Confirmed by Merck Soyla ([id number]) on 05/22/2024 9:52:50 AM    Results RADIOLOGY Calcium  score CT: Calcium  score zero, no blockages detected (2021)  DIAGNOSTIC ECG: Normal Risk Assessment/Calculations:       Physical Exam:   VS:  BP 128/62 (BP Location: Left Arm, Patient Position: Sitting, Cuff Size: Normal)   Pulse 78   Ht 5' 4 (1.626 m)   Wt 117 lb 9.6 oz (53.3 kg)   SpO2 98%   BMI 20.19 kg/m    Wt Readings from Last 3  Encounters:  05/22/24 117 lb 9.6 oz (53.3 kg)  05/12/24 115 lb (52.2 kg)  04/23/24 118 lb 3.2 oz (53.6 kg)     Physical Exam GENERAL: Alert, cooperative, well developed, no acute distress HEENT: Normocephalic, normal oropharynx, moist mucous membranes CHEST: Clear to auscultation bilaterally, no wheezes, rhonchi, or crackles CARDIOVASCULAR: Normal heart rate and rhythm, S1 and S2 normal without murmurs ABDOMEN: Soft, non-tender, non-distended, without organomegaly, normal bowel sounds EXTREMITIES: No cyanosis or edema NEUROLOGICAL: Cranial nerves grossly intact, moves all extremities without gross motor or sensory deficit   ASSESSMENT AND PLAN: .    Assessment and Plan Assessment & Plan Essential hypertension Blood pressure controlled with amlodipine . Anxiety may affect readings. - Continue amlodipine  10 mg daily. - Contact provider if blood pressure is consistently low.  Type 1 diabetes mellitus Diabetes followed by endocrinology with insulin  pump therapy. Continue insulin  pump therapy.  - Follow up with endocrinologist as needed. Diabetes uncontrolled per endocrine, HbA1c 9.9%.  Hyperlipidemia Cholesterol stable with atorvastatin . LDL could improve. Prefers dietary changes. - Continue atorvastatin  10 mg daily. - Focus on dietary modifications for LDL improvement. - Reassess cholesterol in 6-12 months.        Soyla Merck, MD, FACC

## 2024-06-05 ENCOUNTER — Other Ambulatory Visit: Payer: Self-pay

## 2024-06-05 DIAGNOSIS — Z79899 Other long term (current) drug therapy: Secondary | ICD-10-CM

## 2024-06-05 DIAGNOSIS — F39 Unspecified mood [affective] disorder: Secondary | ICD-10-CM

## 2024-06-05 NOTE — Telephone Encounter (Signed)
 Copied from CRM [id number]. Topic: Clinical - Medication Refill >> Jun 05, 2024  4:13 PM Nessti S wrote: Medication: clonazePAM  (KLONOPIN ) 1 MG tablet  Has the patient contacted their pharmacy? Yes (Agent: If no, request that the patient contact the pharmacy for the refill. If patient does not wish to contact the pharmacy document the reason why and proceed with request.) (Agent: If yes, when and what did the pharmacy advise?)  This is the patient's preferred pharmacy:  [id number] [place], [place] - [address] [place] [address] Phone: [phone] Fax: [phone]  Is this the correct pharmacy for this prescription? Yes If no, delete pharmacy and type the correct one.   Has the prescription been filled recently? No  Is the patient out of the medication? No  Has the patient been seen for an appointment in the last year OR does the patient have an upcoming appointment? Yes  Can we respond through MyChart? Yes  Agent: Please be advised that Rx refills may take up to 3 business days. We ask that you follow-up with your pharmacy.

## 2024-06-06 MED ORDER — CLONAZEPAM 1 MG PO TABS: 1.0000 mg | ORAL_TABLET | Freq: Every day | ORAL | 0 refills | Status: AC | PRN

## 2024-06-06 NOTE — Telephone Encounter (Signed)
 1. Mood disorder - clonazePAM  (KLONOPIN ) 1 MG tablet; Take 1 tablet (1 mg total) by mouth daily as needed for anxiety. Further refill by PCP  Dispense: 30 tablet; Refill: 0  2. Controlled substance agreement signed - clonazePAM  (KLONOPIN ) 1 MG tablet; Take 1 tablet (1 mg total) by mouth daily as needed for anxiety. Further refill by PCP  Dispense: 30 tablet; Refill: 0   PDMP reviewed, refill for Clonazepam  1 mg 30 tablets was last done on 05/20/24.  Luke Shade, MD

## 2024-06-24 ENCOUNTER — Other Ambulatory Visit: Payer: Self-pay

## 2024-06-24 ENCOUNTER — Ambulatory Visit: Payer: Self-pay

## 2024-06-24 DIAGNOSIS — F39 Unspecified mood [affective] disorder: Secondary | ICD-10-CM

## 2024-06-24 DIAGNOSIS — Z79899 Other long term (current) drug therapy: Secondary | ICD-10-CM

## 2024-06-24 NOTE — Telephone Encounter (Signed)
 FYI Only or Action Required?: FYI only for provider: UC advised.  Patient was last seen in primary care on 05/12/2024 by Abbey Bruckner, MD.  Called Nurse Triage reporting Sinusitis.  Symptoms began several days ago.  Interventions attempted: Nothing.  Symptoms are: unchanged.  Triage Disposition: See PCP When Office is Open (Within 3 Days)  Patient/caregiver understands and will follow disposition?: Yes  Reason for Disposition  Panic attacks are increasing in frequency  [1] Sinus congestion (pressure, fullness) AND [2] present > 10 days  Answer Assessment - Initial Assessment Questions Patient complains of facial pain, and a slightly worse cough than normal. No appointments available at Dr Graylon office within guidelines. UC advised. 1. LOCATION: Where does it hurt?      Facial pain 2. ONSET: When did the sinus pain start?  (e.g., hours, days)      3 days ago 3. SEVERITY: How bad is the pain?   (Scale 0-10; or none, mild, moderate or severe)     6 4. RECURRENT SYMPTOM: Have you ever had sinus problems before? If Yes, ask: When was the last time? and What happened that time?      yes 5. NASAL CONGESTION: Is the nose blocked? If Yes, ask: Can you open it or must you breathe through your mouth?     Denies, states it actually dry 6. NASAL DISCHARGE: Do you have discharge from your nose? If so ask, What color?     Denies 7. FEVER: Do you have a fever? If Yes, ask: What is it, how was it measured, and when did it start?      Denies 8. OTHER SYMPTOMS: Do you have any other symptoms? (e.g., sore throat, cough, earache, difficulty breathing)     Cough  Answer Assessment - Initial Assessment Questions Was taking Clonazepam  2mg  2 a day, about 5 weeks ago went down to 1mg  says she is struggling a lot and having more panic attacks. States when she has panic attacks its hard to know she doesn't have as much  1. CONCERN: Did anything happen that prompted you to  call today?      Denies 2. ANXIETY SYMPTOMS: Can you describe how you (your loved one; patient) have been feeling? (e.g., tense, restless, panicky, anxious, keyed up, overwhelmed, sense of impending doom).      Restless, panicky  3. ONSET: How long have you been feeling this way? (e.g., hours, days, weeks)     5 weeks 4. SEVERITY: How would you rate the level of anxiety? (e.g., 0 - 10; or mild, moderate, severe).     mild 5. FUNCTIONAL IMPAIRMENT: How have these feelings affected your ability to do daily activities? Have you had more difficulty than usual doing your normal daily activities? (e.g., getting better, same, worse; self-care, school, work, interactions)     Struggling  6. HISTORY: Have you felt this way before? Have you ever been diagnosed with an anxiety problem in the past? (e.g., generalized anxiety disorder, panic attacks, PTSD). If Yes, ask: How was this problem treated? (e.g., medicines, counseling, etc.)     history 7. RISK OF HARM - SUICIDAL IDEATION: Do you ever have thoughts of hurting or killing yourself? If Yes, ask:  Do you have these feelings now? Do you have a plan on how you would do this?     Denies 8. TREATMENT:  What has been done so far to treat this anxiety? (e.g., medicines, relaxation strategies). What has helped?     Clonazpam 9.  THERAPIST: Do you have a counselor or therapist? If Yes, ask: What is their name?     Was suppose to get a call from one that [facility] for a counselor  10. POTENTIAL TRIGGERS: Do you drink caffeinated beverages (e.g., coffee, colas, teas), and how much daily? Do you drink alcohol or use any drugs? Have you started any new medicines recently?       Drinks 2.5L a day 11. PATIENT SUPPORT: Who is with you now? Who do you live with? Do you have family or friends who you can talk to?        Denies 12. OTHER SYMPTOMS: Do you have any other symptoms? (e.g., feeling depressed, trouble  concentrating, trouble sleeping, trouble breathing, palpitations or fast heartbeat, chest pain, sweating, nausea, or diarrhea)       Depression  Protocols used: Sinus Pain or Congestion-A-AH, Anxiety and Panic Attack-A-AH

## 2024-06-24 NOTE — Telephone Encounter (Signed)
 Patient requesting to go back on 2mg  of Clonazepam  instead of 1mg .

## 2024-06-25 ENCOUNTER — Ambulatory Visit: Payer: Self-pay

## 2024-06-25 NOTE — Telephone Encounter (Signed)
 Medication dose increment not appropriate without office visit. Please recommend office visit. She is not due for a refill as her last refill for Clonazepam  1 mg for 30 tablets was filled on 06/16/24.   Luke Shade, MD

## 2024-06-25 NOTE — Telephone Encounter (Signed)
 Patient is scheduled with Urgent Care today at 1:45 pm.

## 2024-07-04 ENCOUNTER — Telehealth: Payer: MEDICAID

## 2024-07-04 DIAGNOSIS — F1320 Sedative, hypnotic or anxiolytic dependence, uncomplicated: Secondary | ICD-10-CM

## 2024-07-04 DIAGNOSIS — F39 Unspecified mood [affective] disorder: Secondary | ICD-10-CM

## 2024-07-04 MED ORDER — CLONAZEPAM 1 MG PO TABS: 1.0000 mg | ORAL_TABLET | Freq: Every day | ORAL | 0 refills | Status: DC

## 2024-07-04 MED ORDER — CLONAZEPAM 0.5 MG PO TABS: 0.5000 mg | ORAL_TABLET | Freq: Every evening | ORAL | 0 refills | Status: DC | PRN

## 2024-07-04 MED ORDER — BREXPIPRAZOLE 1 MG PO TABS: 1.0000 mg | ORAL_TABLET | Freq: Every day | ORAL | 1 refills | Status: AC

## 2024-07-04 NOTE — Assessment & Plan Note (Signed)
 Chronic Clonazepam  therapy for anxiety, plan per mood disorder.

## 2024-07-04 NOTE — Progress Notes (Signed)
 "  Virtual Visit via Video Note  I connected with [name] on 07/04/2024 at 11:30 AM EST by a video enabled telemedicine application and verified that I am speaking with the correct person using two identifiers.  Patient Location: Other:  car, in a parking lot, does not want to be around her family during her appointment Provider Location: Office/Clinic  I discussed the limitations, risks, security, and privacy concerns of performing an evaluation and management service by video and the availability of in person appointments. I also discussed with the patient that there may be a patient responsible charge related to this service. The patient expressed understanding and agreed to proceed.  Subjective: PCP: Abbey Bruckner, MD  Chief Complaint  Patient presents with   Medication Dose Change    Patient states she wanted to discuss dose change, as her Anxiety has been really bad. Patient states this is the second year without her husband. Patient states everything is just making it worse. Patient states when she was seeing Dr Hope for Clonazepam  2 mg and states discussed with Dr Abbey of lowering dose, but thinks that made worse.   HPI: [name] is a 48 year old female with anxiety and depression who presents via video visit to discuss increased anxiety and difficulty managing her symptoms.  She reports heightened anxiety and nervousness, with persistent worrying, especially around the holiday season and the anniversary of her husband's passing. The loss of her father three months prior to her husband's death has compounded her emotional distress.  She is currently on Zoloft  100 mg, Rexulti  1 mg daily. She has been taking clonazepam , previously at 1 mg twice a day, but has reduced the dose to 0.5 mg twice a day. The lower dose is not as effective, leading to increased anxiety, especially in the mornings. She also takes Zoloft  100 mg, Rexulti  1 mg daily and Buspar  15 mg three  times a day.   and  daily for depression and anxiety. Her medication regimen also includes Requip  1 mg, Percocet prescribed by a pain doctor for a broken vertebrae, and gabapentin  300 mg three times a day for neuropathy in her legs. She manages her diabetes with an insulin  pump and uses a sliding scale for insulin  dosing managed by endocrinologist.   No thoughts of self-harm or harm to others, stating 'my kids need me, I wouldn't dare do something like that.'  ROS: Per HPI Current Medications[1]  Observations/Objective: Today's Vitals   07/04/24 1132  PainSc: 0-No pain   Physical Exam Vitals reviewed: Physical exam limited due to nature of video visit.  Constitutional:      General: She is not in acute distress. Neurological:     Mental Status: She is alert and oriented to person, place, and time.  Psychiatric:        Speech: Speech normal.        Behavior: Behavior normal. Behavior is cooperative.        Thought Content: Thought content does not include homicidal or suicidal ideation. Thought content does not include homicidal or suicidal plan.     Assessment and Plan: Mood disorder Assessment & Plan: Increased anxiety, not tolerating tapering down dose of Clonazepam  0.5 mg twice a day. Anxiety likely exacerbated by recent bereavements and clonazepam  reduction. No suicidal ideation or thoughts of harming others. Recommend: taking Clonazepam  1 mg in the morning and 0.5 mg at bed time as needed. PDMP reviewed, medication refill sent.  Continue Zoloft , Rexulti , and Buspar .  Recommend in office visit in 6-8 weeks for further evaluation. Recommend going to ED, calling 911 or seeking immediate medical attention if worsening anxiety, SI/HI. Patient verbalized understanding and agrees with above plan.   Orders: -     Brexpiprazole ; Take 1 tablet (1 mg total) by mouth daily.  Dispense: 90 tablet; Refill: 1 -     clonazePAM ; Take 1 tablet (1 mg total) by mouth daily. Further refill by PCP   Dispense: 30 tablet; Refill: 0  Benzodiazepine dependence (HCC) Assessment & Plan: Chronic Clonazepam  therapy for anxiety, plan per mood disorder.   Orders: -     clonazePAM ; Take 1 tablet (0.5 mg total) by mouth at bedtime as needed for anxiety.  Dispense: 30 tablet; Refill: 0   Follow Up Instructions: Return in about 4 weeks (around 08/01/2024) for 6-8 weeks in office visit for chronic follow up .   I discussed the assessment and treatment plan with the patient. The patient was provided an opportunity to ask questions, and all were answered. The patient agreed with the plan and demonstrated an understanding of the instructions.   The patient was advised to call back or seek an in-person evaluation if the symptoms worsen or if the condition fails to improve as anticipated.  The above assessment and management plan was discussed with the patient. The patient verbalized understanding of and has agreed to the management plan.   Akeylah Hendel, MD     [1]  Current Outpatient Medications:    clonazePAM  (KLONOPIN ) 0.5 MG tablet, Take 1 tablet (0.5 mg total) by mouth at bedtime as needed for anxiety., Disp: 30 tablet, Rfl: 0   amLODipine  (NORVASC ) 10 MG tablet, Take 1 tablet (10 mg total) by mouth daily., Disp: 90 tablet, Rfl: 3   atorvastatin  (LIPITOR) 10 MG tablet, Take 1 tablet (10 mg total) by mouth at bedtime., Disp: 90 tablet, Rfl: 3   Blood Glucose Monitoring Suppl (ACCU-CHEK GUIDE ME) w/Device KIT, Use to check blood sugar daily, Disp: 1 kit, Rfl: 0   [START ON 08/09/2024] brexpiprazole  (REXULTI ) 1 MG TABS tablet, Take 1 tablet (1 mg total) by mouth daily., Disp: 90 tablet, Rfl: 1   budesonide -formoterol  (SYMBICORT ) 160-4.5 MCG/ACT inhaler, Inhale 2 puffs into the lungs 2 (two) times daily., Disp: 1 each, Rfl: 11   busPIRone  (BUSPAR ) 15 MG tablet, Take 1 tablet (15 mg total) by mouth 3 (three) times daily., Disp: 90 tablet, Rfl: 2   Cholecalciferol  (VITAMIN D3) 50 MCG (2000 UT) capsule,  Take 2,000 Units by mouth daily., Disp: , Rfl:    clonazePAM  (KLONOPIN ) 1 MG tablet, Take 1 tablet (1 mg total) by mouth daily. Further refill by PCP, Disp: 30 tablet, Rfl: 0   Continuous Glucose Sensor (DEXCOM G6 SENSOR) MISC, 1 each by Does not apply route See admin instructions. Change sensor every 10 days; E10.9, Disp: 6 each, Rfl: 1   Continuous Glucose Sensor (DEXCOM G7 SENSOR) MISC, Change every 10 days, Disp: 3 each, Rfl: 3   Continuous Glucose Sensor (DEXCOM G7 SENSOR) MISC, Use to check glucose continuously, change sensor every 10 days, Disp: , Rfl:    Continuous Glucose Sensor (DEXCOM G7 SENSOR) MISC, CHANGE EVERY 10 DAYS, Disp: 9 each, Rfl: 3   gabapentin  (NEURONTIN ) 300 MG capsule, Take 1 capsule (300 mg total) by mouth 3 (three) times daily., Disp: 270 capsule, Rfl: 1   glucagon  1 MG injection, Inject 1 mg into the muscle once as needed., Disp: 1 each, Rfl: 12   insulin  aspart (  NOVOLOG ) 100 UNIT/ML injection, INJECT 40 UNITS DAILY PER INSULIN  PUMP (MAX DOSAGE) (Patient not taking: Reported on 05/22/2024), Disp: 30 mL, Rfl: 1   Insulin  Pen Needle (PEN NEEDLES) 32G X 5 MM MISC, Use as instructed, Disp: 100 each, Rfl: 0   Multiple Vitamin (MULTIVITAMIN WITH MINERALS) TABS tablet, Take 1 tablet by mouth daily., Disp: , Rfl:    naloxone (NARCAN) nasal spray 4 mg/0.1 mL, Place 1 spray into the nose once., Disp: , Rfl:    omeprazole  (PRILOSEC) 40 MG capsule, Take 1 capsule (40 mg total) by mouth daily. Take one capsule daily, Disp: 90 capsule, Rfl: 3   oxyCODONE -acetaminophen  (PERCOCET) 7.5-325 MG tablet, Take 1 tablet by mouth 3 (three) times daily as needed., Disp: , Rfl:    rOPINIRole  (REQUIP ) 1 MG tablet, TAKE 1 TABLET (1 MG TOTAL) BY MOUTH AT BEDTIME. TAKE 1 TABLET BY MOUTH AT BEDTIME, Disp: 90 tablet, Rfl: 3   sertraline  (ZOLOFT ) 100 MG tablet, Take 1 tablet (100 mg total) by mouth daily., Disp: 135 tablet, Rfl: 2   VENTOLIN  HFA 108 (90 Base) MCG/ACT inhaler, , Disp: , Rfl:   "

## 2024-07-04 NOTE — Assessment & Plan Note (Addendum)
 Increased anxiety, not tolerating tapering down dose of Clonazepam  0.5 mg twice a day. Anxiety likely exacerbated by recent bereavements and clonazepam  reduction. No suicidal ideation or thoughts of harming others. Recommend: taking Clonazepam  1 mg in the morning and 0.5 mg at bed time as needed. PDMP reviewed, medication refill sent.  Continue Zoloft , Rexulti , and Buspar . Recommend in office visit in 6-8 weeks for further evaluation. Recommend going to ED, calling 911 or seeking immediate medical attention if worsening anxiety, SI/HI. Patient verbalized understanding and agrees with above plan.

## 2024-07-07 DIAGNOSIS — B3731 Acute candidiasis of vulva and vagina: Secondary | ICD-10-CM | POA: Insufficient documentation

## 2024-07-07 MED ORDER — FLUCONAZOLE 150 MG PO TABS: ORAL_TABLET | ORAL | 0 refills | Status: DC

## 2024-07-07 NOTE — Telephone Encounter (Signed)
 1. Vaginal yeast infection (Primary) - 06/25/24: Treated with Augmentin  for bacterial sinusitis.  - fluconazole  (DIFLUCAN ) 150 MG tablet; Take 150 mg, if symptoms persists, a second 150 mg 72 hours after the first dose  Dispense: 2 tablet; Refill: 0  - Prophylaxis for potential vaginal yeast infection sent to the pharmacy. Recommend office visit or UC visit if symptoms persists. Mychart message sent.   Luke Shade, MD

## 2024-07-22 ENCOUNTER — Other Ambulatory Visit: Payer: Self-pay

## 2024-07-22 DIAGNOSIS — J4489 Other specified chronic obstructive pulmonary disease: Secondary | ICD-10-CM

## 2024-07-23 MED ORDER — BUDESONIDE-FORMOTEROL FUMARATE 160-4.5 MCG/ACT IN AERO: 2.0000 | INHALATION_SPRAY | Freq: Two times a day (BID) | RESPIRATORY_TRACT | 11 refills | Status: DC

## 2024-07-23 NOTE — Telephone Encounter (Signed)
 1. Asthma-COPD overlap syndrome (HCC) (Primary) - budesonide -formoterol  (SYMBICORT ) 160-4.5 MCG/ACT inhaler; Inhale 2 puffs into the lungs 2 (two) times daily.  Dispense: 1 each; Refill: 11   Luke Shade, MD

## 2024-07-24 ENCOUNTER — Telehealth: Payer: Self-pay

## 2024-07-24 DIAGNOSIS — J4489 Other specified chronic obstructive pulmonary disease: Secondary | ICD-10-CM

## 2024-07-24 MED ORDER — SPIRIVA RESPIMAT 2.5 MCG/ACT IN AERS: 2.0000 | INHALATION_SPRAY | Freq: Every day | RESPIRATORY_TRACT | 3 refills | Status: AC

## 2024-07-24 NOTE — Telephone Encounter (Signed)
 Closing this encounter.  Spiriva  refill sent to the pharmacy.  Refer to mychart message to patient from 07/24/2024.  Luke Shade, MD

## 2024-07-24 NOTE — Telephone Encounter (Signed)
 Copied from CRM #[id number]. Topic: Clinical - Prescription Issue >> Jul 24, 2024 10:55 AM Drema MATSU wrote: Reason for CRM: Pt stated that Symbicort  was sent to the pharmacy and she has not taken it before. She wants Spiriva  called in. She has always taken the Spriva and it helps.

## 2024-07-24 NOTE — Telephone Encounter (Signed)
 1. Asthma-COPD overlap syndrome (HCC) (Primary) - Tiotropium Bromide  (SPIRIVA  RESPIMAT) 2.5 MCG/ACT AERS; Inhale 2 puffs into the lungs daily.  Dispense: 4 g; Refill: 3   Luke Shade, MD

## 2024-07-28 ENCOUNTER — Telehealth: Payer: Self-pay

## 2024-07-28 NOTE — Telephone Encounter (Signed)
 Vb full, unable to leave a message, MyChart message sent;  Your appontment on1/13/2026 has a time change to 10:30 am. If you are unable to come at this time, please calle the office to reschedule your appointment.

## 2024-07-29 ENCOUNTER — Ambulatory Visit: Payer: MEDICAID

## 2024-07-31 ENCOUNTER — Other Ambulatory Visit: Payer: Self-pay

## 2024-07-31 DIAGNOSIS — F1320 Sedative, hypnotic or anxiolytic dependence, uncomplicated: Secondary | ICD-10-CM

## 2024-07-31 DIAGNOSIS — F39 Unspecified mood [affective] disorder: Secondary | ICD-10-CM

## 2024-07-31 DIAGNOSIS — F410 Panic disorder [episodic paroxysmal anxiety] without agoraphobia: Secondary | ICD-10-CM

## 2024-07-31 MED ORDER — CLONAZEPAM 0.5 MG PO TABS: 0.5000 mg | ORAL_TABLET | Freq: Every evening | ORAL | 0 refills | Status: AC | PRN

## 2024-07-31 NOTE — Progress Notes (Signed)
 Clonazepam  0.5 mg 30 tablets refill sent on 07/04/24. Refill appropriate, sent to the pharmacy.   1. Panic disorder without agoraphobia (Primary) - clonazePAM  (KLONOPIN ) 0.5 MG tablet; Take 1 tablet (0.5 mg total) by mouth at bedtime as needed for anxiety.  Dispense: 30 tablet; Refill: 0  2. Benzodiazepine dependence (HCC) - clonazePAM  (KLONOPIN ) 0.5 MG tablet; Take 1 tablet (0.5 mg total) by mouth at bedtime as needed for anxiety.  Dispense: 30 tablet; Refill: 0  Luke Shade, MD

## 2024-07-31 NOTE — Telephone Encounter (Signed)
 Copied from CRM [id number]. Topic: Clinical - Medication Refill >> Jul 31, 2024 11:39 AM Rea ORN wrote: Medication:  clonazePAM  (KLONOPIN ) 0.5 MG tablet Pt stated she spoke with someone in the clinic to request this earlier this week.   Has the patient contacted their pharmacy? No (Agent: If no, request that the patient contact the pharmacy for the refill. If patient does not wish to contact the pharmacy document the reason why and proceed with request.) (Agent: If yes, when and what did the pharmacy advise?)  This is the patient's preferred pharmacy:  [id number] [place], [place] - [address] [place] [address] Phone: [phone] Fax: [phone]  Is this the correct pharmacy for this prescription? Yes If no, delete pharmacy and type the correct one.   Has the prescription been filled recently? No  Is the patient out of the medication? No  Has the patient been seen for an appointment in the last year OR does the patient have an upcoming appointment? Yes  Can we respond through MyChart? Yes  Agent: Please be advised that Rx refills may take up to 3 business days. We ask that you follow-up with your pharmacy.

## 2024-08-04 MED ORDER — CLONAZEPAM 1 MG PO TABS: 1.0000 mg | ORAL_TABLET | Freq: Every day | ORAL | 0 refills | Status: DC

## 2024-08-04 NOTE — Telephone Encounter (Signed)
 1. Mood disorder - PDMP reviewed, refill appropriate. - Also talked with the patient on the phone she is currently taking clonazepam  1 mg in the a.m. and 0.5 mg in the p.m. with improvement in anxiety symptoms. - clonazePAM  (KLONOPIN ) 1 MG tablet; Take 1 tablet (1 mg total) by mouth daily. Further refill by PCP  Dispense: 30 tablet; Refill: 0  Luke Shade, MD

## 2024-08-14 ENCOUNTER — Ambulatory Visit: Payer: MEDICAID

## 2024-08-19 ENCOUNTER — Ambulatory Visit: Payer: Self-pay

## 2024-08-19 NOTE — Telephone Encounter (Signed)
 FYI Only or Action Required?: FYI only for provider: appointment scheduled on 2/6.  Patient was last seen in primary care on 07/04/2024 by Abbey Bruckner, MD.  Called Nurse Triage reporting No chief complaint on file..  Symptoms began several days ago.  Interventions attempted: Nothing.  Symptoms are: gradually worsening.  Triage Disposition: No disposition on file.  Patient/caregiver understands and will follow disposition?:   Reason for Triage: Patient has an appnt on 08-27-24 and called to reschedule sooner,but states she is having really bad anxiety issues and has a knot on the back of her neck that is very painful.   Reason for Disposition  [1] Swelling is painful to touch AND [2] no fever  Answer Assessment - Initial Assessment Questions Only wanted to see PCP  1. APPEARANCE of SWELLING: What does it look like?     Hasn't looked at it 2. SIZE: How large is the swelling? (e.g., inches, cm; or compare to size of pinhead, tip of pen, eraser, coin, pea, grape, ping pong ball)      Size of quarter 3. LOCATION: Where is the swelling located?     Base of neck 4. ONSET: When did the swelling start?     Days ago 5. COLOR: What color is it? Is there more than one color?     unknown 6. PAIN: Is there any pain? If Yes, ask: How bad is the pain? (Scale 1-10; or mild, moderate, severe)       moderate  Protocols used: Skin Lump or Localized Swelling-A-AH

## 2024-08-22 ENCOUNTER — Ambulatory Visit: Payer: MEDICAID

## 2024-08-22 VITALS — BP 118/74 | HR 86 | Temp 97.9°F | Ht 64.0 in | Wt 117.2 lb

## 2024-08-22 DIAGNOSIS — F39 Unspecified mood [affective] disorder: Secondary | ICD-10-CM

## 2024-08-22 DIAGNOSIS — L219 Seborrheic dermatitis, unspecified: Secondary | ICD-10-CM | POA: Insufficient documentation

## 2024-08-22 DIAGNOSIS — Z720 Tobacco use: Secondary | ICD-10-CM

## 2024-08-22 DIAGNOSIS — F1320 Sedative, hypnotic or anxiolytic dependence, uncomplicated: Secondary | ICD-10-CM

## 2024-08-22 DIAGNOSIS — I152 Hypertension secondary to endocrine disorders: Secondary | ICD-10-CM

## 2024-08-22 DIAGNOSIS — E108 Type 1 diabetes mellitus with unspecified complications: Secondary | ICD-10-CM

## 2024-08-22 DIAGNOSIS — F5101 Primary insomnia: Secondary | ICD-10-CM

## 2024-08-22 MED ORDER — KETOCONAZOLE 2 % EX SHAM: 1.0000 | MEDICATED_SHAMPOO | CUTANEOUS | 0 refills | Status: AC

## 2024-08-22 MED ORDER — KETOCONAZOLE 2 % EX SHAM: 1.0000 | MEDICATED_SHAMPOO | CUTANEOUS | 0 refills | Status: DC

## 2024-08-22 NOTE — Progress Notes (Signed)
 "  Acute Office Visit  Subjective:    Patient ID: [name], female    DOB: Dec 22, 1975, 49 y.o.   MRN: [id number]  Chief Complaint  Patient presents with   Mass   Anxiety    Discussed the use of AI scribe software for clinical note transcription with the patient, who gave verbal consent to proceed.  History of Present Illness [name] is a 49 year old female who presents with a scalp lump evaluation and anxiety follow-up.   Nape of scalp lump: She has a lump on her scalp that was previously large enough to cause pain radiating to her head and neck when she turned her head.  Significantly improved now.  No recent head trauma. She experiences daily bandlike headaches. She associates these headaches with muscle tightness. No fevers, chills, vision change, nausea, or vomiting.  Generalized anxiety disorder Grief She has a history of anxiety, which has worsened since her husband's passing (over a year and half ago).  She is currently taking clonazepam   1 mg first thing in the morning, 0.5 mg to 1 mg around 5 PM and 0.5 mg before she goes to bed. She also takes BuSpar  15 mg, on average she takes this 2-3 times a day.  Rexulti  1 mg daily, Zoloft  100 mg daily (previously prescribed by psychiatrist).  She has Medicaid and has hard time finding psychiatrist and psychologist to take her insurance.  Patient reports tapering clonazepam  dosing contributed to worsening anxiety.  She reports difficulty sleeping and anxiety not being well controlled with current treatment regimen.   She has polyarthritis, chronic pain syndrome for which she follows up with pain clinic.  She  is prescribed Percocet which she says she takes daily but does not remember how many times a day.  She is also on gabapentin  300 mg 3 times daily.  She is also taking ropinirole  1 mg at bedtime for restless leg syndrome.  Type I DM:  On insulin  pump.   HTN: Well-managed with amlodipine  10 mg  daily. Hyperlipidemia managed with Lipitor 10 mg daily. GERD, symptoms stable on omeprazole  40 mg daily.  Current everyday smoker, asthma-copd:  She smokes one and a half packs of cigarettes per day.  Currently on Spiriva  2 puff daily for maintenance and Ventolin  as needed for rescue.     ROS As per HPI    Objective:    BP 118/74 (BP Location: Left Arm, Patient Position: Sitting)   Pulse 86   Temp 97.9 F (36.6 C) (Oral)   Ht 5' 4 (1.626 m)   Wt 117 lb 3.2 oz (53.2 kg)   SpO2 99%   BMI 20.12 kg/m    Physical Exam Constitutional:      General: She is not in acute distress. Cardiovascular:     Rate and Rhythm: Normal rate.  Pulmonary:     Effort: Pulmonary effort is normal.     Breath sounds: Normal breath sounds.  Abdominal:     Tenderness: There is no abdominal tenderness. There is no guarding.  Musculoskeletal:     Cervical back: No rigidity.     Right lower leg: No edema.     Left lower leg: No edema.  Skin:    Comments: Creacy,  white scaly lesion on posterior hairline and nape of scalp  Neurological:     Mental Status: She is alert.  Psychiatric:        Mood and Affect: Mood is anxious.  Behavior: Behavior is cooperative.        Thought Content: Thought content does not include homicidal or suicidal ideation. Thought content does not include homicidal or suicidal plan.     No results found for any visits on 08/22/24.     Assessment & Plan:   Assessment & Plan Seborrheic dermatitis Scalp exam reassuring except for seborrheic dermatitis on nape of scalp.  Start ketoconazole  shampoo twice a week.  Prescription sent to the pharmacy.  No abnormal lump, bump noted during today's exam.  Recommend patient schedule a follow-up appointment if she notes new scalp lesion, swelling, lump, mass. Orders:   ketoconazole  (NIZORAL ) 2 % shampoo; Apply 1 Application topically 2 (two) times a week.  Benzodiazepine dependence (HCC) Currently taking clonazepam   1 mg  first thing in the morning, 0.5 mg to 1 mg around 5 PM and 0.5 mg before she goes to bed. Previously tried tapering down clonazepam  dosing, patient did not tolerate. Check urine drug screen today. Consider increasing dose of clonazepam  to 1 mg twice a day till patient is able to establish care with psychiatry and psychologist. Referral to psychiatry and psychology made today. Discussed risk related to chronic use of benzodiazepine including early onset dementia, dependence, interaction with other medication, increased risk of aspiration.  PDMP reviewed.  Not appropriate for refill of Clonazepam . Waiting on UDS results.  Orders:   Urine drugs of abuse scrn w alc, routine (Ref Lab)   Ambulatory referral to Psychiatry   Ambulatory referral to Psychology  Primary insomnia Plan per mood disorder and benzodiazepine dependence. Orders:   Ambulatory referral to Psychiatry   Ambulatory referral to Psychology  Mood disorder Generalized anxiety disorder, grief related to losing her husband about a year and a half ago.  Patient is currently on multiple medications including clonazepam , BuSpar , Rexulti  with suboptimal control of anxiety.  Psychiatry evaluation and management needed given ongoing symptoms on multiple medications.  Referral to psychiatry and psychologist made today. Orders:   Ambulatory referral to Psychiatry   Ambulatory referral to Psychology  Tobacco use Continued smoking 1.5 to 2.5 packs per day. Smoking cessation advised to improve health and reduce medication interactions. Advised smoking cessation.     Hypertension associated with diabetes (HCC) Blood pressure within goal of less than 130 x 80 mmHg.  Continue amlodipine  as prescribed by cardiologist.     Type 1 diabetes mellitus with complications (HCC) On insulin  pump, continue follow-up with endocrinologist for type 1 diabetes management.     Return in about 6 weeks (around 10/03/2024) for mood follow up .  Luke Shade, MD  "

## 2024-08-22 NOTE — Assessment & Plan Note (Deleted)
 [name]

## 2024-08-22 NOTE — Assessment & Plan Note (Addendum)
 Generalized anxiety disorder, grief related to losing her husband about a year and a half ago.  Patient is currently on multiple medications including clonazepam , BuSpar , Rexulti  with suboptimal control of anxiety.  Psychiatry evaluation and management needed given ongoing symptoms on multiple medications.  Referral to psychiatry and psychologist made today. Orders:   Ambulatory referral to Psychiatry   Ambulatory referral to Psychology

## 2024-08-22 NOTE — Assessment & Plan Note (Addendum)
 Plan per mood disorder and benzodiazepine dependence. Orders:   Ambulatory referral to Psychiatry   Ambulatory referral to Psychology

## 2024-08-22 NOTE — Assessment & Plan Note (Addendum)
 Currently taking clonazepam   1 mg first thing in the morning, 0.5 mg to 1 mg around 5 PM and 0.5 mg before she goes to bed. Previously tried tapering down clonazepam  dosing, patient did not tolerate. Check urine drug screen today. Consider increasing dose of clonazepam  to 1 mg twice a day till patient is able to establish care with psychiatry and psychologist. Referral to psychiatry and psychology made today. Discussed risk related to chronic use of benzodiazepine including early onset dementia, dependence, interaction with other medication, increased risk of aspiration.  PDMP reviewed.  Not appropriate for refill of Clonazepam . Waiting on UDS results.  Orders:   Urine drugs of abuse scrn w alc, routine (Ref Lab)   Ambulatory referral to Psychiatry   Ambulatory referral to Psychology

## 2024-08-22 NOTE — Assessment & Plan Note (Signed)
 On insulin  pump, continue follow-up with endocrinologist for type 1 diabetes management.

## 2024-08-22 NOTE — Assessment & Plan Note (Addendum)
 Scalp exam reassuring except for seborrheic dermatitis on nape of scalp.  Start ketoconazole  shampoo twice a week.  Prescription sent to the pharmacy.  No abnormal lump, bump noted during today's exam.  Recommend patient schedule a follow-up appointment if she notes new scalp lesion, swelling, lump, mass. Orders:   ketoconazole  (NIZORAL ) 2 % shampoo; Apply 1 Application topically 2 (two) times a week.

## 2024-08-22 NOTE — Assessment & Plan Note (Addendum)
 Continued smoking 1.5 to 2.5 packs per day. Smoking cessation advised to improve health and reduce medication interactions. Advised smoking cessation.

## 2024-08-22 NOTE — Patient Instructions (Addendum)
-   Use ketoconazole  shampoo twice a week for about 2 months then stop.  - If you start to notice the neck lump again we need to see you in the clinic.  - We will increase clonazepam  to 1 mg twice a day. But we need ou to see a psychiatrist for management of anxiety, stress, insomnia.   [email or web address]   You can look into above website to find therapists near you

## 2024-08-22 NOTE — Assessment & Plan Note (Addendum)
 Blood pressure within goal of less than 130 x 80 mmHg.  Continue amlodipine  as prescribed by cardiologist.

## 2024-08-27 ENCOUNTER — Ambulatory Visit: Payer: MEDICAID

## 2024-10-16 ENCOUNTER — Ambulatory Visit: Payer: MEDICAID
